# Patient Record
Sex: Male | Born: 1950 | Race: White | Hispanic: No | Smoking: Never smoker
Health system: Southern US, Community
[De-identification: ages and names within clinical notes are randomized; demographics above are authoritative.]

## PROBLEM LIST (undated history)

## (undated) DIAGNOSIS — G8929 Other chronic pain: Secondary | ICD-10-CM

## (undated) DIAGNOSIS — F32A Depression, unspecified: Secondary | ICD-10-CM

## (undated) DIAGNOSIS — F1010 Alcohol abuse, uncomplicated: Secondary | ICD-10-CM

## (undated) DIAGNOSIS — I10 Essential (primary) hypertension: Secondary | ICD-10-CM

## (undated) DIAGNOSIS — F329 Major depressive disorder, single episode, unspecified: Secondary | ICD-10-CM

## (undated) DIAGNOSIS — S069X9A Unspecified intracranial injury with loss of consciousness of unspecified duration, initial encounter: Secondary | ICD-10-CM

## (undated) DIAGNOSIS — S069XAA Unspecified intracranial injury with loss of consciousness status unknown, initial encounter: Secondary | ICD-10-CM

## (undated) DIAGNOSIS — F4310 Post-traumatic stress disorder, unspecified: Secondary | ICD-10-CM

## (undated) HISTORY — PX: KNEE SURGERY: SHX244

## (undated) HISTORY — PX: HERNIA REPAIR: SHX51

## (undated) HISTORY — PX: BACK SURGERY: SHX140

## (undated) HISTORY — PX: OTHER SURGICAL HISTORY: SHX169

---

## 2000-09-29 ENCOUNTER — Ambulatory Visit (HOSPITAL_COMMUNITY): Admission: RE | Admit: 2000-09-29 | Discharge: 2000-09-29 | Payer: Self-pay | Admitting: Psychiatry

## 2000-09-29 ENCOUNTER — Encounter: Payer: Self-pay | Admitting: Psychiatry

## 2000-11-10 ENCOUNTER — Encounter: Admission: RE | Admit: 2000-11-10 | Discharge: 2000-11-14 | Payer: Self-pay

## 2001-03-09 ENCOUNTER — Emergency Department (HOSPITAL_COMMUNITY): Admission: EM | Admit: 2001-03-09 | Discharge: 2001-03-09 | Payer: Self-pay | Admitting: *Deleted

## 2001-05-16 ENCOUNTER — Emergency Department (HOSPITAL_COMMUNITY): Admission: EM | Admit: 2001-05-16 | Discharge: 2001-05-16 | Payer: Self-pay | Admitting: Emergency Medicine

## 2001-05-21 ENCOUNTER — Emergency Department (HOSPITAL_COMMUNITY): Admission: EM | Admit: 2001-05-21 | Discharge: 2001-05-21 | Payer: Self-pay | Admitting: *Deleted

## 2001-07-26 ENCOUNTER — Encounter: Payer: Self-pay | Admitting: Emergency Medicine

## 2001-07-26 ENCOUNTER — Emergency Department (HOSPITAL_COMMUNITY): Admission: EM | Admit: 2001-07-26 | Discharge: 2001-07-26 | Payer: Self-pay | Admitting: *Deleted

## 2001-07-29 ENCOUNTER — Emergency Department (HOSPITAL_COMMUNITY): Admission: EM | Admit: 2001-07-29 | Discharge: 2001-07-29 | Payer: Self-pay | Admitting: Emergency Medicine

## 2001-07-31 ENCOUNTER — Emergency Department (HOSPITAL_COMMUNITY): Admission: EM | Admit: 2001-07-31 | Discharge: 2001-07-31 | Payer: Self-pay

## 2001-08-03 ENCOUNTER — Encounter: Payer: Self-pay | Admitting: Emergency Medicine

## 2001-08-03 ENCOUNTER — Emergency Department (HOSPITAL_COMMUNITY): Admission: EM | Admit: 2001-08-03 | Discharge: 2001-08-03 | Payer: Self-pay | Admitting: Emergency Medicine

## 2001-08-05 ENCOUNTER — Emergency Department (HOSPITAL_COMMUNITY): Admission: EM | Admit: 2001-08-05 | Discharge: 2001-08-05 | Payer: Self-pay | Admitting: Emergency Medicine

## 2001-08-10 ENCOUNTER — Emergency Department (HOSPITAL_COMMUNITY): Admission: EM | Admit: 2001-08-10 | Discharge: 2001-08-10 | Payer: Self-pay | Admitting: Emergency Medicine

## 2001-09-09 ENCOUNTER — Emergency Department (HOSPITAL_COMMUNITY): Admission: EM | Admit: 2001-09-09 | Discharge: 2001-09-09 | Payer: Self-pay | Admitting: Emergency Medicine

## 2001-10-06 ENCOUNTER — Emergency Department (HOSPITAL_COMMUNITY): Admission: EM | Admit: 2001-10-06 | Discharge: 2001-10-06 | Payer: Self-pay

## 2001-10-08 ENCOUNTER — Emergency Department (HOSPITAL_COMMUNITY): Admission: EM | Admit: 2001-10-08 | Discharge: 2001-10-08 | Payer: Self-pay | Admitting: Emergency Medicine

## 2001-12-13 ENCOUNTER — Encounter: Payer: Self-pay | Admitting: Emergency Medicine

## 2001-12-13 ENCOUNTER — Emergency Department (HOSPITAL_COMMUNITY): Admission: EM | Admit: 2001-12-13 | Discharge: 2001-12-14 | Payer: Self-pay | Admitting: Emergency Medicine

## 2001-12-15 ENCOUNTER — Encounter: Payer: Self-pay | Admitting: Emergency Medicine

## 2001-12-15 ENCOUNTER — Inpatient Hospital Stay (HOSPITAL_COMMUNITY): Admission: EM | Admit: 2001-12-15 | Discharge: 2001-12-18 | Payer: Self-pay | Admitting: Emergency Medicine

## 2002-01-23 ENCOUNTER — Emergency Department (HOSPITAL_COMMUNITY): Admission: EM | Admit: 2002-01-23 | Discharge: 2002-01-23 | Payer: Self-pay | Admitting: *Deleted

## 2002-03-08 ENCOUNTER — Emergency Department (HOSPITAL_COMMUNITY): Admission: EM | Admit: 2002-03-08 | Discharge: 2002-03-09 | Payer: Self-pay | Admitting: Emergency Medicine

## 2002-03-09 ENCOUNTER — Encounter: Payer: Self-pay | Admitting: Emergency Medicine

## 2002-03-09 ENCOUNTER — Emergency Department (HOSPITAL_COMMUNITY): Admission: EM | Admit: 2002-03-09 | Discharge: 2002-03-09 | Payer: Self-pay | Admitting: *Deleted

## 2003-02-08 ENCOUNTER — Emergency Department (HOSPITAL_COMMUNITY): Admission: EM | Admit: 2003-02-08 | Discharge: 2003-02-08 | Payer: Self-pay | Admitting: Emergency Medicine

## 2003-02-08 ENCOUNTER — Encounter: Payer: Self-pay | Admitting: Emergency Medicine

## 2014-02-15 ENCOUNTER — Emergency Department: Payer: Self-pay | Admitting: Emergency Medicine

## 2014-02-16 LAB — URINALYSIS, COMPLETE
BACTERIA: NONE SEEN
BILIRUBIN, UR: NEGATIVE
BLOOD: NEGATIVE
GLUCOSE, UR: NEGATIVE mg/dL (ref 0–75)
Hyaline Cast: 4
Leukocyte Esterase: NEGATIVE
NITRITE: NEGATIVE
Ph: 5 (ref 4.5–8.0)
Protein: NEGATIVE
SQUAMOUS EPITHELIAL: NONE SEEN
Specific Gravity: 1.017 (ref 1.003–1.030)
WBC UR: 1 /HPF (ref 0–5)

## 2014-02-16 LAB — COMPREHENSIVE METABOLIC PANEL
ALT: 40 U/L
Albumin: 3.1 g/dL — ABNORMAL LOW (ref 3.4–5.0)
Alkaline Phosphatase: 70 U/L
Anion Gap: 5 — ABNORMAL LOW (ref 7–16)
BUN: 31 mg/dL — ABNORMAL HIGH (ref 7–18)
Bilirubin,Total: 0.5 mg/dL (ref 0.2–1.0)
CHLORIDE: 105 mmol/L (ref 98–107)
CREATININE: 2.43 mg/dL — AB (ref 0.60–1.30)
Calcium, Total: 8.7 mg/dL (ref 8.5–10.1)
Co2: 30 mmol/L (ref 21–32)
EGFR (African American): 35 — ABNORMAL LOW
GFR CALC NON AF AMER: 29 — AB
Glucose: 68 mg/dL (ref 65–99)
Osmolality: 284 (ref 275–301)
Potassium: 5 mmol/L (ref 3.5–5.1)
SGOT(AST): 39 U/L — ABNORMAL HIGH (ref 15–37)
Sodium: 140 mmol/L (ref 136–145)
Total Protein: 6.6 g/dL (ref 6.4–8.2)

## 2014-02-16 LAB — DRUG SCREEN, URINE
AMPHETAMINES, UR SCREEN: NEGATIVE (ref ?–1000)
BARBITURATES, UR SCREEN: NEGATIVE (ref ?–200)
BENZODIAZEPINE, UR SCRN: NEGATIVE (ref ?–200)
COCAINE METABOLITE, UR ~~LOC~~: NEGATIVE (ref ?–300)
Cannabinoid 50 Ng, Ur ~~LOC~~: NEGATIVE (ref ?–50)
MDMA (ECSTASY) UR SCREEN: NEGATIVE (ref ?–500)
Methadone, Ur Screen: POSITIVE (ref ?–300)
OPIATE, UR SCREEN: NEGATIVE (ref ?–300)
PHENCYCLIDINE (PCP) UR S: NEGATIVE (ref ?–25)
TRICYCLIC, UR SCREEN: POSITIVE (ref ?–1000)

## 2014-02-16 LAB — CBC
HCT: 42.3 % (ref 40.0–52.0)
HGB: 13.4 g/dL (ref 13.0–18.0)
MCH: 26.8 pg (ref 26.0–34.0)
MCHC: 31.8 g/dL — ABNORMAL LOW (ref 32.0–36.0)
MCV: 84 fL (ref 80–100)
Platelet: 217 10*3/uL (ref 150–440)
RBC: 5.02 10*6/uL (ref 4.40–5.90)
RDW: 17.7 % — ABNORMAL HIGH (ref 11.5–14.5)
WBC: 8.6 10*3/uL (ref 3.8–10.6)

## 2014-02-16 LAB — SALICYLATE LEVEL: Salicylates, Serum: <1.7 mg/dL

## 2014-02-16 LAB — ETHANOL: Ethanol: <3 mg/dL

## 2014-02-16 LAB — ACETAMINOPHEN LEVEL

## 2014-05-11 ENCOUNTER — Emergency Department: Payer: Self-pay | Admitting: Emergency Medicine

## 2014-05-12 LAB — BASIC METABOLIC PANEL
ANION GAP: 8 (ref 7–16)
BUN: 24 mg/dL — ABNORMAL HIGH (ref 7–18)
CHLORIDE: 116 mmol/L — AB (ref 98–107)
Calcium, Total: 7.9 mg/dL — ABNORMAL LOW (ref 8.5–10.1)
Co2: 24 mmol/L (ref 21–32)
Creatinine: 1.65 mg/dL — ABNORMAL HIGH (ref 0.60–1.30)
EGFR (African American): 54 — ABNORMAL LOW
GFR CALC NON AF AMER: 45 — AB
GLUCOSE: 87 mg/dL (ref 65–99)
Osmolality: 298 (ref 275–301)
Potassium: 4.5 mmol/L (ref 3.5–5.1)
Sodium: 148 mmol/L — ABNORMAL HIGH (ref 136–145)

## 2014-05-12 LAB — CBC WITH DIFFERENTIAL/PLATELET
BASOS ABS: 0.1 10*3/uL (ref 0.0–0.1)
BASOS PCT: 0.7 %
EOS ABS: 1.1 10*3/uL — AB (ref 0.0–0.7)
Eosinophil %: 10 %
HCT: 42.4 % (ref 40.0–52.0)
HGB: 13.5 g/dL (ref 13.0–18.0)
LYMPHS PCT: 23.6 %
Lymphocyte #: 2.5 10*3/uL (ref 1.0–3.6)
MCH: 26.4 pg (ref 26.0–34.0)
MCHC: 31.9 g/dL — ABNORMAL LOW (ref 32.0–36.0)
MCV: 83 fL (ref 80–100)
Monocyte #: 1.4 x10 3/mm — ABNORMAL HIGH (ref 0.2–1.0)
Monocyte %: 12.9 %
Neutrophil #: 5.5 10*3/uL (ref 1.4–6.5)
Neutrophil %: 52.8 %
Platelet: 113 10*3/uL — ABNORMAL LOW (ref 150–440)
RBC: 5.12 10*6/uL (ref 4.40–5.90)
RDW: 18.7 % — ABNORMAL HIGH (ref 11.5–14.5)
WBC: 10.5 10*3/uL (ref 3.8–10.6)

## 2014-05-12 LAB — SEDIMENTATION RATE: ERYTHROCYTE SED RATE: 3 mm/h (ref 0–20)

## 2014-05-17 LAB — CULTURE, BLOOD (SINGLE)

## 2014-09-13 NOTE — Consult Note (Signed)
PATIENT NAME:  Omar Davis, Omar Davis MR#:  [id number] DATE OF BIRTH:  Jul 12, 1950  DATE OF CONSULTATION:  02/16/2014  CONSULTING PHYSICIAN:  Tarisha Fader K. Franchot Mimes, MD  PLACE OF DICTATION:  [facility] Emergency Room #24, [place], [place].  PATIENT'S AGE:  64 years.   SEX:  Male    The patient was seen in consultation in [facility] Emergency Room #24.  The patient is a 64 year old  male, not employed and is retired from [employer].  The patient is divorced for many years and lives by himself and his girlfriend visits him and sometimes stays with him and she is 66 years old.  The patient was brought to the Emergency Room after he took too many methadone pills which are prescribed to him and this was accidental.  The patient reports that he is in chronic pain and goes to the Pain Clinic and has an appointment coming up at the [facility] for the same.   MENTAL STATUS EXAMINATION:  The patient is alert and oriented to person, place and time, fully aware of the situation that brought him here.  He admits feeling low down and depressed about his chronic pain and not getting enough benefit from the meds..  No psychosis, does not appear to respond to internal stimuli. Cognition intact. General fund of information is fair.  Denies suicidal, homicidal plans and contracts for safety, eager to get discharged and go home and keep up his followup appointment at the [facility] outpatient clinic.   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 History of PTSD.   RECOMMENDATION:   Discharge the patient home with his girlfriend so that he can keep up is followup appointments at the [place].    ____________________________ Wallace Cullens. Franchot Mimes, MD skc:lt D: 02/16/2014 13:13:39 ET T: 02/16/2014 15:23:39 ET JOB#: [id number]  cc: Arlyn Leak K. Franchot Mimes, MD, <Dictator> Dewain Penning MD ELECTRONICALLY SIGNED 03/07/2014 14:22

## 2014-10-24 ENCOUNTER — Emergency Department: Payer: Medicare Other

## 2014-10-24 ENCOUNTER — Encounter: Payer: Self-pay | Admitting: *Deleted

## 2014-10-24 ENCOUNTER — Emergency Department
Admission: EM | Admit: 2014-10-24 | Discharge: 2014-10-24 | Disposition: A | Discharge status: Home / Self Care | Payer: Medicare Other | Attending: Emergency Medicine | Admitting: Emergency Medicine

## 2014-10-24 DIAGNOSIS — F32A Depression, unspecified: Secondary | ICD-10-CM | POA: Insufficient documentation

## 2014-10-24 DIAGNOSIS — F329 Major depressive disorder, single episode, unspecified: Secondary | ICD-10-CM | POA: Insufficient documentation

## 2014-10-24 DIAGNOSIS — Z720 Tobacco use: Secondary | ICD-10-CM | POA: Insufficient documentation

## 2014-10-24 DIAGNOSIS — Z880 Allergy status to penicillin: Secondary | ICD-10-CM | POA: Insufficient documentation

## 2014-10-24 DIAGNOSIS — G8929 Other chronic pain: Secondary | ICD-10-CM | POA: Insufficient documentation

## 2014-10-24 DIAGNOSIS — M25561 Pain in right knee: Secondary | ICD-10-CM | POA: Insufficient documentation

## 2014-10-24 DIAGNOSIS — Z9889 Other specified postprocedural states: Secondary | ICD-10-CM | POA: Insufficient documentation

## 2014-10-24 HISTORY — DX: Depression, unspecified: F32.A

## 2014-10-24 HISTORY — DX: Major depressive disorder, single episode, unspecified: F32.9

## 2014-10-24 HISTORY — DX: Other chronic pain: G89.29

## 2014-10-24 MED ORDER — KETOROLAC TROMETHAMINE 60 MG/2ML IM SOLN
60.0000 mg | Freq: Once | INTRAMUSCULAR | Status: AC
  Administered 2014-10-24: 60 mg via INTRAMUSCULAR

## 2014-10-24 MED ORDER — HYDROCODONE-ACETAMINOPHEN 5-325 MG PO TABS: 1.0000 | ORAL_TABLET | ORAL | Status: DC | PRN

## 2014-10-24 MED ORDER — KETOROLAC TROMETHAMINE 60 MG/2ML IM SOLN
INTRAMUSCULAR | Status: AC
  Filled 2014-10-24: qty 2

## 2014-10-24 NOTE — Discharge Instructions (Signed)
° °  [facility] OR CALL [facility] ABOUT AN APPOINTMENT YOU WERE GIVEN AN INJECTION OF TORADOL TODAY NORCO FOR 2 DAYS ONLY AS DIRECTED USE KNEE BRACE WHEN WALKING FOR 3-4 DAYS

## 2014-10-24 NOTE — ED Provider Notes (Signed)
[facility] Emergency Department Provider Note  ____________________________________________  Time seen: 10:15 AM  I have reviewed the triage vital signs and the nursing notes.   HISTORY  Chief Complaint Knee Pain   HPI Melesio Madara is a 64 y.o. male is here with complaint of chronic right knee pain. He states that he had a knee replacement in November at the [facility]. He is dissatisfied with the "service" that he is getting there and that they will only give him tramadol for his pain. He states this is like drinking water. He is interested in seeing an orthopedist in this area. Currently he rates his pain as 9 out of 10 and is worse with moving.He also has his knee wrapped with Coban and it is somewhat tight around his knee. He states he needs some form of brace for his knee to reduce his pain.   Past Medical History  Diagnosis Date  . Depressed   . Chronic pain     Patient Active Problem List   Diagnosis Date Noted  . Depressed   . Chronic pain     Past Surgical History  Procedure Laterality Date  . Knee surgery    . Hernia repair    . Prostectomy N/A     Current Outpatient Rx  Name  Route  Sig  Dispense  Refill  . HYDROcodone-acetaminophen (NORCO/VICODIN) 5-325 MG per tablet   Oral   Take 1 tablet by mouth every 4 (four) hours as needed for moderate pain.   12 tablet   0     Allergies Penicillins  No family history on file.  Social History History  Substance Use Topics  . Smoking status: Current Every Day Smoker  . Smokeless tobacco: Not on file  . Alcohol Use: No    Review of Systems Constitutional: No fever/chills Cardiovascular: Denies chest pain. Respiratory: Denies shortness of breath. Gastrointestinal: No abdominal pain.  No nausea, no vomiting.  Genitourinary: Negative for dysuria. Musculoskeletal: Negative for back pain. Skin: Negative for rash. Neurological: Negative for headaches, focal weakness or  numbness. Psychiatric:PTSD and depression  10-point ROS otherwise negative.  ____________________________________________   PHYSICAL EXAM:  VITAL SIGNS: ED Triage Vitals  Enc Vitals Group     BP 10/24/14 0929 145/85 mmHg     Pulse Rate 10/24/14 0929 85     Resp --      Temp 10/24/14 0929 99.1 F (37.3 C)     Temp Source 10/24/14 0929 Oral     SpO2 10/24/14 0929 97 %     Weight 10/24/14 0929 190 lb (86.183 kg)     Height 10/24/14 0929 5\' 8"  (1.727 m)     Head Cir --      Peak Flow --      Pain Score 10/24/14 0959 9     Pain Loc --      Pain Edu? --      Excl. in [place]? --     Constitutional: Alert and oriented. Well appearing and in no acute distress. Eyes: Conjunctivae are normal. PERRL. EOMI. Head: Atraumatic. Nose: No congestion/rhinnorhea. Neck: No stridor.   Cardiovascular: Normal rate, regular rhythm. Grossly normal heart sounds.  Good peripheral circulation. Respiratory: Normal respiratory effort.  No retractions. Lungs CTAB. Musculoskeletal: No lower  right leg. tenderness nor edema.  Left leg no edema and no effusion noted. Moderate tenderness on palpation anterior knee. Range of motion is restricted secondary to pain. Neurologic:  Normal speech and language. No gross  focal neurologic deficits are appreciated. Speech is normal. No gait instability. Skin:  Skin is warm, dry and intact. No rash, warmth or erythema noted noted. Psychiatric: Mood and affect are normal. Speech and behavior are normal.  ____________________________________________   LABS (all labs ordered are listed, but only abnormal results are displayed)  Labs Reviewed - No data to display RADIOLOGY  Status post total knee replacement with prosthetic components well seated per radiologist. No fracture or dislocation. No joint effusion no erosive change. ____________________________________________   PROCEDURES  Procedure(s) performed: None  Critical Care performed:  No  ____________________________________________   INITIAL IMPRESSION / ASSESSMENT AND PLAN / ED COURSE  Pertinent labs & imaging results that were available during my care of the patient were reviewed by me and considered in my medical decision making (see chart for details).  Patient is  to follow-up with the [facility] or Dr. Marry Guan.  He is given a knee immobilizer to wear for several days just for support. He is to continue his ultram as directed.  He was given Norco to take for today and tomorrow and then resume his tramadol.     FINAL CLINICAL IMPRESSION(S) / ED DIAGNOSES  Final diagnoses:  Knee pain, chronic, right      Johnn Hai, PA-C 10/24/14 [place], MD 10/24/14 [phone]

## 2014-10-27 ENCOUNTER — Emergency Department
Admission: EM | Admit: 2014-10-27 | Discharge: 2014-10-27 | Disposition: A | Discharge status: Home / Self Care | Payer: Medicare Other | Attending: Emergency Medicine | Admitting: Emergency Medicine

## 2014-10-27 ENCOUNTER — Encounter: Payer: Self-pay | Admitting: Emergency Medicine

## 2014-10-27 DIAGNOSIS — Z96651 Presence of right artificial knee joint: Secondary | ICD-10-CM | POA: Insufficient documentation

## 2014-10-27 DIAGNOSIS — M25561 Pain in right knee: Secondary | ICD-10-CM | POA: Insufficient documentation

## 2014-10-27 DIAGNOSIS — Z7952 Long term (current) use of systemic steroids: Secondary | ICD-10-CM | POA: Insufficient documentation

## 2014-10-27 DIAGNOSIS — Z79899 Other long term (current) drug therapy: Secondary | ICD-10-CM | POA: Insufficient documentation

## 2014-10-27 MED ORDER — PREDNISONE 10 MG PO TABS: ORAL_TABLET | ORAL | Status: DC

## 2014-10-27 MED ORDER — KETOROLAC TROMETHAMINE 60 MG/2ML IM SOLN
INTRAMUSCULAR | Status: AC
  Administered 2014-10-27: 60 mg
  Filled 2014-10-27: qty 2

## 2014-10-27 MED ORDER — OXYCODONE-ACETAMINOPHEN 5-325 MG PO TABS: 1.0000 | ORAL_TABLET | ORAL | Status: DC | PRN

## 2014-10-27 MED ORDER — DIAZEPAM 5 MG PO TABS
ORAL_TABLET | ORAL | Status: AC
  Administered 2014-10-27: 5 mg via ORAL
  Filled 2014-10-27: qty 1

## 2014-10-27 MED ORDER — CYCLOBENZAPRINE HCL 10 MG PO TABS: 10.0000 mg | ORAL_TABLET | Freq: Three times a day (TID) | ORAL | Status: DC | PRN

## 2014-10-27 MED ORDER — DIAZEPAM 5 MG PO TABS
5.0000 mg | ORAL_TABLET | Freq: Once | ORAL | Status: AC
  Administered 2014-10-27: 5 mg via ORAL

## 2014-10-27 MED ORDER — KETOROLAC TROMETHAMINE 30 MG/ML IJ SOLN: 60.0000 mg | Freq: Once | INTRAMUSCULAR | Status: AC

## 2014-10-27 NOTE — ED Notes (Addendum)
Pt states that he had a TKR in November last year. Since then he has had some burning in his right knee. He started having pain in his right knee about 1 week ago. He was treated in the ER for that . He states since then  the pain has been getting worse. Pt states that he takes tramadol and it doesn't help with pain.

## 2014-10-27 NOTE — ED Provider Notes (Signed)
[facility] Emergency Department Provider Note  ____________________________________________  Time seen: Approximately 5:29 PM  I have reviewed the triage vital signs and the nursing notes.   HISTORY  Chief Complaint Knee Pain    HPI Omar Davis is a 64 y.o. 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 An echo patient was seen here 3 days ago for the same and not getting better. States pain is a 10 over 10. States he is scheduled to follow-up with the [place] soon but no appointment as of yet. Desires an MRI.  Past Medical History  Diagnosis Date  . Depressed   . Chronic pain     Patient Active Problem List   Diagnosis Date Noted  . Depressed   . Chronic pain     Past Surgical History  Procedure Laterality Date  . Knee surgery    . Hernia repair    . Prostectomy N/A   . Knee surgery      Current Outpatient Rx  Name  Route  Sig  Dispense  Refill  . cyclobenzaprine (FLEXERIL) 10 MG tablet   Oral   Take 1 tablet (10 mg total) by mouth every 8 (eight) hours as needed for muscle spasms.   30 tablet   1   . HYDROcodone-acetaminophen (NORCO/VICODIN) 5-325 MG per tablet   Oral   Take 1 tablet by mouth every 4 (four) hours as needed for moderate pain.   12 tablet   0   . oxyCODONE-acetaminophen (ROXICET) 5-325 MG per tablet   Oral   Take 1-2 tablets by mouth every 4 (four) hours as needed for severe pain.   15 tablet   0   . predniSONE (DELTASONE) 10 MG tablet      Take 5 tablets daily for 5 days.   25 tablet   0     Allergies Gabapentin and Penicillins  No family history on file.  Social History History  Substance Use Topics  . Smoking status: Former Research scientist (life sciences)  . Smokeless tobacco: Not on file  . Alcohol Use: Not on file    Review of Systems Constitutional: No  fever/chills Eyes: No visual changes. ENT: No sore throat. Cardiovascular: Denies chest pain. Respiratory: Denies shortness of breath. Gastrointestinal: No abdominal pain.  No nausea, no vomiting.  No diarrhea.  No constipation. Genitourinary: Negative for dysuria. Musculoskeletal: Positive for right knee pain. Skin: Negative for rash. Neurological: Negative for headaches, focal weakness or numbness.  10-point ROS otherwise negative.  ____________________________________________   PHYSICAL EXAM:  VITAL SIGNS: ED Triage Vitals  Enc Vitals Group     BP 10/27/14 1630 129/74 mmHg     Pulse Rate 10/27/14 1630 98     Resp 10/27/14 1630 20     Temp 10/27/14 1630 99.3 F (37.4 C)     Temp Source 10/27/14 1630 Oral     SpO2 10/27/14 1630 98 %     Weight 10/27/14 1630 185 lb (83.915 kg)     Height 10/27/14 1630 5\' 8"  (1.727 m)     Head Cir --      Peak Flow --      Pain Score 10/27/14 1632 9     Pain Loc --      Pain Edu? --      Excl. in  [place]? --     Constitutional: Alert and oriented. Well appearing and in no acute distress. Eyes: Conjunctivae are normal. PERRL. EOMI. Head: Atraumatic. Nose: No congestion/rhinnorhea. Mouth/Throat: Mucous membranes are moist.  Oropharynx non-erythematous. Neck: No stridor.   Cardiovascular: Normal rate, regular rhythm. Grossly normal heart sounds.  Good peripheral circulation. Respiratory: Normal respiratory effort.  No retractions. Lungs CTAB. Gastrointestinal: Soft and nontender. No distention. No abdominal bruits. No CVA tenderness. Musculoskeletal: Right knee with tenderness to range of motion. Increased pain with extension and flexion. No warmth or erythema noted to the knee itself. Neurologic:  Normal speech and language. No gross focal neurologic deficits are appreciated. Speech is normal. No gait instability. Skin:  Skin is warm, dry and intact. No rash noted. Psychiatric: Mood and affect are normal. Speech and behavior are  normal.  ____________________________________________   LABS (all labs ordered are listed, but only abnormal results are displayed)  Labs Reviewed - No data to display ____________________________________________  EKG  Deferred ____________________________________________  RADIOLOGY  Deferred ____________________________________________   PROCEDURES  Procedure(s) performed: None  Critical Care performed: No  ____________________________________________   INITIAL IMPRESSION / ASSESSMENT AND PLAN / ED COURSE  Pertinent labs & imaging results that were available during my care of the patient were reviewed by me and considered in my medical decision making (see chart for details).  Previous visit demonstrated well seated prosthetics. Patient still has continuous pain will treat with ibuprofen, Flexeril, Percocet. [facility] Marry [facility]an. He voices no other emergency medical complaints at this time. ____________________________________________   FINAL CLINICAL IMPRESSION(S) / ED DIAGNOSES  Final diagnoses:  Knee pain, acute, right      Arlyss Repress, PA-C 10/27/14 1743  Ponciano Ort, MD 10/27/14 2219

## 2014-10-27 NOTE — Discharge Instructions (Signed)

## 2014-10-30 ENCOUNTER — Encounter: Payer: Self-pay | Admitting: *Deleted

## 2014-10-30 ENCOUNTER — Emergency Department
Admission: EM | Admit: 2014-10-30 | Discharge: 2014-10-30 | Disposition: A | Discharge status: Home / Self Care | Payer: Medicare Other | Attending: Emergency Medicine | Admitting: Emergency Medicine

## 2014-10-30 DIAGNOSIS — Z7952 Long term (current) use of systemic steroids: Secondary | ICD-10-CM | POA: Insufficient documentation

## 2014-10-30 DIAGNOSIS — Z880 Allergy status to penicillin: Secondary | ICD-10-CM | POA: Insufficient documentation

## 2014-10-30 DIAGNOSIS — G8929 Other chronic pain: Secondary | ICD-10-CM | POA: Insufficient documentation

## 2014-10-30 DIAGNOSIS — Z96651 Presence of right artificial knee joint: Secondary | ICD-10-CM | POA: Insufficient documentation

## 2014-10-30 DIAGNOSIS — M25561 Pain in right knee: Secondary | ICD-10-CM

## 2014-10-30 MED ORDER — HYDROMORPHONE HCL 1 MG/ML IJ SOLN
1.0000 mg | Freq: Once | INTRAMUSCULAR | Status: AC
  Administered 2014-10-30: 1 mg via INTRAMUSCULAR

## 2014-10-30 MED ORDER — HYDROMORPHONE HCL 1 MG/ML IJ SOLN
INTRAMUSCULAR | Status: AC
  Administered 2014-10-30: 1 mg via INTRAMUSCULAR
  Filled 2014-10-30: qty 1

## 2014-10-30 MED ORDER — KETOROLAC TROMETHAMINE 60 MG/2ML IM SOLN
60.0000 mg | Freq: Once | INTRAMUSCULAR | Status: AC
  Administered 2014-10-30: 60 mg via INTRAMUSCULAR

## 2014-10-30 MED ORDER — KETOROLAC TROMETHAMINE 60 MG/2ML IM SOLN
INTRAMUSCULAR | Status: AC
  Administered 2014-10-30: 60 mg via INTRAMUSCULAR
  Filled 2014-10-30: qty 2

## 2014-10-30 NOTE — ED Provider Notes (Signed)
[facility] Emergency Department Provider Note  ____________________________________________  Time seen: Approximately 8:01 PM  I have reviewed the triage vital signs and the nursing notes.   HISTORY  Chief Complaint Knee Pain    HPI Omar Davis is a 64 y.o. male patient here for medication for right knee pain. Patient has a history of AV patient area does not have an orthopedic appointment to the 14th of this month. This is the patient's third visit this month for pain medication. Patient stated his intention to kick him to the ER to get pain medication to his apartment 14th of June. Patient states this pain is going on for several weeks now with a history of any knee replacement of the same extremity back in October 2015. Patient is rating his pain as a 10 over 10. States pain increases with extension of the knee. State he has had x-rays on his previous visit which are unremarkable.   Past Medical History  Diagnosis Date  . Depressed   . Chronic pain     Patient Active Problem List   Diagnosis Date Noted  . Depressed   . Chronic pain     Past Surgical History  Procedure Laterality Date  . Knee surgery    . Hernia repair    . Prostectomy N/A   . Knee surgery      Current Outpatient Rx  Name  Route  Sig  Dispense  Refill  . cyclobenzaprine (FLEXERIL) 10 MG tablet   Oral   Take 1 tablet (10 mg total) by mouth every 8 (eight) hours as needed for muscle spasms.   30 tablet   1   . HYDROcodone-acetaminophen (NORCO/VICODIN) 5-325 MG per tablet   Oral   Take 1 tablet by mouth every 4 (four) hours as needed for moderate pain.   12 tablet   0   . oxyCODONE-acetaminophen (ROXICET) 5-325 MG per tablet   Oral   Take 1-2 tablets by mouth every 4 (four) hours as needed for severe pain.   15 tablet   0   . predniSONE (DELTASONE) 10 MG tablet      Take 5 tablets daily for 5 days.   25 tablet   0     Allergies Gabapentin and  Penicillins  No family history on file.  Social History History  Substance Use Topics  . Smoking status: Never Smoker   . Smokeless tobacco: Not on file  . Alcohol Use: No    Review of Systems Constitutional: No fever/chills Eyes: No visual changes. ENT: No sore throat. Cardiovascular: Denies chest pain. Respiratory: Denies shortness of breath. Gastrointestinal: No abdominal pain.  No nausea, no vomiting.  No diarrhea.  No constipation. Genitourinary: Negative for dysuria. Musculoskeletal: Right knee pain. Skin: Negative for rash. Neurological: Negative for headaches, focal weakness or numbness. 10-point ROS otherwise negative.  ____________________________________________   PHYSICAL EXAM:  VITAL SIGNS: ED Triage Vitals  Enc Vitals Group     BP 10/30/14 1954 136/73 mmHg     Pulse Rate 10/30/14 1954 104     Resp 10/30/14 1954 18     Temp 10/30/14 1954 98.2 F (36.8 C)     Temp src --      SpO2 10/30/14 1954 100 %     Weight 10/30/14 1954 185 lb (83.915 kg)     Height 10/30/14 1954 5\' 8"  (1.727 m)     Head Cir --      Peak Flow --  Pain Score 10/30/14 1955 10     Pain Loc --      Pain Edu? --      Excl. in [place]? --     Constitutional: Alert and oriented. Well appearing and in no acute distress. Eyes: Conjunctivae are normal. PERRL. EOMI. Head: Atraumatic. Nose: No congestion/rhinnorhea. Mouth/Throat: Mucous membranes are moist.  Oropharynx non-erythematous. Neck: No stridor.  No deformity for nuchal range of motion nontender palpation. Hematological/Lymphatic/Immunilogical: No cervical lymphadenopathy. Cardiovascular: Normal rate, regular rhythm. Grossly normal heart sounds.  Good peripheral circulation. Respiratory: Normal respiratory effort.  No retractions. Lungs CTAB. Gastrointestinal: Soft and nontender. No distention. No abdominal bruits. No CVA tenderness. Musculoskeletal: No obvious deformity to the right knee no obvious edema some mild crepitus  nontender palpation patient is in a wheelchair when asked to stand complain of pain with ambulation. Neurologic:  Normal speech and language. No gross focal neurologic deficits are appreciated. Speech is normal. No gait instability. Skin:  Skin is warm, dry and intact. No rash noted. Psychiatric: Mood and affect are normal. Speech and behavior are normal.  ____________________________________________   LABS (all labs ordered are listed, but only abnormal results are displayed)  Labs Reviewed - No data to display ____________________________________________  EKG   ____________________________________________  RADIOLOGY  Review of previous x-ray shows no abnormality of the prosthesis of the right knee. ____________________________________________   PROCEDURES  Procedure(s) performed: None  Critical Care performed: No  ____________________________________________   INITIAL IMPRESSION / ASSESSMENT AND PLAN / ED COURSE  Pertinent labs & imaging results that were available during my care of the patient were reviewed by me and considered in my medical decision making (see chart for details).  Chronic knee pain ____________________________________________   FINAL CLINICAL IMPRESSION(S) / ED DIAGNOSES  Final diagnoses:  Right knee pain      Sable Feil, PA-C 10/30/14 2017  Lisa Roca, MD 10/30/14 2250

## 2014-10-30 NOTE — Discharge Instructions (Signed)
Advised to follow up with [facility], or Urgent care for continual care.

## 2014-10-30 NOTE — ED Notes (Signed)
Patient with no complaints at this time. Respirations even and unlabored. Skin warm/dry. Discharge instructions reviewed with patient at this time. Patient given opportunity to voice concerns/ask questions. Patient discharged at this time and left Emergency Department, via wheelchair.   

## 2014-10-30 NOTE — ED Notes (Signed)
Pt states he has had right knee pain for several weeks, hx of knee replacement in same extremity. Pt was seen here on the 3rd and has run out of his pain medication, "it's all I want and then I can go home." Pt has appt with ortho doc on the 14th.

## 2014-10-31 ENCOUNTER — Encounter: Payer: Self-pay | Admitting: Emergency Medicine

## 2014-10-31 ENCOUNTER — Emergency Department: Payer: Medicare Other

## 2014-10-31 ENCOUNTER — Emergency Department
Admission: EM | Admit: 2014-10-31 | Discharge: 2014-10-31 | Disposition: A | Discharge status: Home / Self Care | Payer: Medicare Other | Attending: Emergency Medicine | Admitting: Emergency Medicine

## 2014-10-31 DIAGNOSIS — S39012A Strain of muscle, fascia and tendon of lower back, initial encounter: Secondary | ICD-10-CM | POA: Insufficient documentation

## 2014-10-31 DIAGNOSIS — S8991XA Unspecified injury of right lower leg, initial encounter: Secondary | ICD-10-CM | POA: Insufficient documentation

## 2014-10-31 DIAGNOSIS — W19XXXA Unspecified fall, initial encounter: Secondary | ICD-10-CM

## 2014-10-31 DIAGNOSIS — Y9389 Activity, other specified: Secondary | ICD-10-CM | POA: Insufficient documentation

## 2014-10-31 DIAGNOSIS — Y998 Other external cause status: Secondary | ICD-10-CM | POA: Insufficient documentation

## 2014-10-31 DIAGNOSIS — M25561 Pain in right knee: Secondary | ICD-10-CM

## 2014-10-31 DIAGNOSIS — W1839XA Other fall on same level, initial encounter: Secondary | ICD-10-CM | POA: Insufficient documentation

## 2014-10-31 DIAGNOSIS — Y9289 Other specified places as the place of occurrence of the external cause: Secondary | ICD-10-CM | POA: Insufficient documentation

## 2014-10-31 MED ORDER — HYDROCODONE-ACETAMINOPHEN 5-325 MG PO TABS: 1.0000 | ORAL_TABLET | Freq: Four times a day (QID) | ORAL | Status: DC | PRN

## 2014-10-31 MED ORDER — HYDROCODONE-ACETAMINOPHEN 5-325 MG PO TABS
1.0000 | ORAL_TABLET | ORAL | Status: AC
  Administered 2014-10-31: 1 via ORAL

## 2014-10-31 NOTE — ED Notes (Signed)
Chronic right knee pain, knee gave out this am and patient fell.  Did not hit head.  Denies neck pain.  States he now has worsened back pain after fall.

## 2014-10-31 NOTE — Discharge Instructions (Signed)
Knee Pain The knee is the complex joint between your thigh and your lower leg. It is made up of bones, tendons, ligaments, and cartilage. The bones that make up the knee are:  The femur in the thigh.  The tibia and fibula in the lower leg.  The patella or kneecap riding in the groove on the lower femur. CAUSES  Knee pain is a common complaint with many causes. A few of these causes are:  Injury, such as:  A ruptured ligament or tendon injury.  Torn cartilage.  Medical conditions, such as:  Gout  Arthritis  Infections  Overuse, over training, or overdoing a physical activity. Knee pain can be minor or severe. Knee pain can accompany debilitating injury. Minor knee problems often respond well to self-care measures or get well on their own. More serious injuries may need medical intervention or even surgery. SYMPTOMS The knee is complex. Symptoms of knee problems can vary widely. Some of the problems are:  Pain with movement and weight bearing.  Swelling and tenderness.  Buckling of the knee.  Inability to straighten or extend your knee.  Your knee locks and you cannot straighten it.  Warmth and redness with pain and fever.  Deformity or dislocation of the kneecap. DIAGNOSIS  Determining what is wrong may be very straight forward such as when there is an injury. It can also be challenging because of the complexity of the knee. Tests to make a diagnosis may include:  Your caregiver taking a history and doing a physical exam.  Routine X-rays can be used to rule out other problems. X-rays will not reveal a cartilage tear. Some injuries of the knee can be diagnosed by:  Arthroscopy a surgical technique by which a small video camera is inserted through tiny incisions on the sides of the knee. This procedure is used to examine and repair internal knee joint problems. Tiny instruments can be used during arthroscopy to repair the torn knee cartilage (meniscus).  Arthrography  is a radiology technique. A contrast liquid is directly injected into the knee joint. Internal structures of the knee joint then become visible on X-ray film.  An MRI scan is a non X-ray radiology procedure in which magnetic fields and a computer produce two- or three-dimensional images of the inside of the knee. Cartilage tears are often visible using an MRI scanner. MRI scans have largely replaced arthrography in diagnosing cartilage tears of the knee.  Blood work.  Examination of the fluid that helps to lubricate the knee joint (synovial fluid). This is done by taking a sample out using a needle and a syringe. TREATMENT The treatment of knee problems depends on the cause. Some of these treatments are:  Depending on the injury, proper casting, splinting, surgery, or physical therapy care will be needed.  Give yourself adequate recovery time. Do not overuse your joints. If you begin to get sore during workout routines, back off. Slow down or do fewer repetitions.  For repetitive activities such as cycling or running, maintain your strength and nutrition.  Alternate muscle groups. For example, if you are a weight lifter, work the upper body on one day and the lower body the next.  Either tight or weak muscles do not give the proper support for your knee. Tight or weak muscles do not absorb the stress placed on the knee joint. Keep the muscles surrounding the knee strong.  Take care of mechanical problems.  If you have flat feet, orthotics or special shoes may help.  See your caregiver if you need help. °¨ Arch supports, sometimes with wedges on the inner or outer aspect of the heel, can help. These can shift pressure away from the side of the knee most bothered by osteoarthritis. °¨ A brace called an "unloader" brace also may be used to help ease the pressure on the most arthritic side of the knee. °· If your caregiver has prescribed crutches, braces, wraps or ice, use as directed. The acronym  for this is PRICE. This means protection, rest, ice, compression, and elevation. °· Nonsteroidal anti-inflammatory drugs (NSAIDs), can help relieve pain. But if taken immediately after an injury, they may actually increase swelling. Take NSAIDs with food in your stomach. Stop them if you develop stomach problems. Do not take these if you have a history of ulcers, stomach pain, or bleeding from the bowel. Do not take without your caregiver's approval if you have problems with fluid retention, heart failure, or kidney problems. °· For ongoing knee problems, physical therapy may be helpful. °· Glucosamine and chondroitin are over-the-counter dietary supplements. Both may help relieve the pain of osteoarthritis in the knee. These medicines are different from the usual anti-inflammatory drugs. Glucosamine may decrease the rate of cartilage destruction. °· Injections of a corticosteroid drug into your knee joint may help reduce the symptoms of an arthritis flare-up. They may provide pain relief that lasts a few months. You may have to wait a few months between injections. The injections do have a small increased risk of infection, water retention, and elevated blood sugar levels. °· Hyaluronic acid injected into damaged joints may ease pain and provide lubrication. These injections may work by reducing inflammation. A series of shots may give relief for as long as 6 months. °· Topical painkillers. Applying certain ointments to your skin may help relieve the pain and stiffness of osteoarthritis. Ask your pharmacist for suggestions. Many over the-counter products are approved for temporary relief of arthritis pain. °· In some countries, doctors often prescribe topical NSAIDs for relief of chronic conditions such as arthritis and tendinitis. A review of treatment with NSAID creams found that they worked as well as oral medications but without the serious side effects. °PREVENTION °· Maintain a healthy weight. Extra pounds  put more strain on your joints. °· Get strong, stay limber. Weak muscles are a common cause of knee injuries. Stretching is important. Include flexibility exercises in your workouts. °· Be smart about exercise. If you have osteoarthritis, chronic knee pain or recurring injuries, you may need to change the way you exercise. This does not mean you have to stop being active. If your knees ache after jogging or playing basketball, consider switching to swimming, water aerobics, or other low-impact activities, at least for a few days a week. Sometimes limiting high-impact activities will provide relief. °· Make sure your shoes fit well. Choose footwear that is right for your sport. °· Protect your knees. Use the proper gear for knee-sensitive activities. Use kneepads when playing volleyball or laying carpet. Buckle your seat belt every time you drive. Most shattered kneecaps occur in car accidents. °· Rest when you are tired. °SEEK MEDICAL CARE IF:  °You have knee pain that is continual and does not seem to be getting better.  °SEEK IMMEDIATE MEDICAL CARE IF:  °Your knee joint feels hot to the touch and you have a high fever. °MAKE SURE YOU:  °· Understand these instructions. °· Will watch your condition. °· Will get help right away if you are not   doing well or get worse. Document Released: 03/06/2007 Document Revised: 08/01/2011 Document Reviewed: 03/06/2007 [facility] Patient Information 2015 [place], [place]. This information is not intended to replace advice given to you by your health care provider. Make sure you discuss any questions you have with your health care provider.  Back Pain, Adult Back pain is very common. The pain often gets better over time. The cause of back pain is usually not dangerous. Most people can learn to manage their back pain on their own.  HOME CARE   Stay active. Start with short walks on flat ground if you can. Try to walk farther each day.  Do not sit, drive, or stand in one place  for more than 30 minutes. Do not stay in bed.  Do not avoid exercise or work. Activity can help your back heal faster.  Be careful when you bend or lift an object. Bend at your knees, keep the object close to you, and do not twist.  Sleep on a firm mattress. Lie on your side, and bend your knees. If you lie on your back, put a pillow under your knees.  Only take medicines as told by your doctor.  Put ice on the injured area.  Put ice in a plastic bag.  Place a towel between your skin and the bag.  Leave the ice on for 15-20 minutes, 03-04 times a day for the first 2 to 3 days. After that, you can switch between ice and heat packs.  Ask your doctor about back exercises or massage.  Avoid feeling anxious or stressed. Find good ways to deal with stress, such as exercise. GET HELP RIGHT AWAY IF:   Your pain does not go away with rest or medicine.  Your pain does not go away in 1 week.  You have new problems.  You do not feel well.  The pain spreads into your legs.  You cannot control when you poop (bowel movement) or pee (urinate).  Your arms or legs feel weak or lose feeling (numbness).  You feel sick to your stomach (nauseous) or throw up (vomit).  You have belly (abdominal) pain.  You feel like you may pass out (faint). MAKE SURE YOU:   Understand these instructions.  Will watch your condition.  Will get help right away if you are not doing well or get worse. Document Released: 10/26/2007 Document Revised: 08/01/2011 Document Reviewed: 09/10/2013 [facility] Patient Information 2015 [place], [place]. This information is not intended to replace advice given to you by your health care provider. Make sure you discuss any questions you have with your health care provider.

## 2014-10-31 NOTE — ED Notes (Signed)
Pt states that his right knee gave out and he fell down this morning. He twisted his back and right now has severe pain in his lower back and right knee. Does not have any open wounds. Looks like he is in a lot of pain , says he has been in and out of the ED because he cannot get an appointment with his orthopedic before 14th of this month.

## 2014-10-31 NOTE — ED Provider Notes (Signed)
CSN: [id number]     Arrival date & time 10/31/14  1736 History   First MD Initiated Contact with Patient 10/31/14 1919     Chief Complaint  Patient presents with  . Leg Pain     (Consider location/radiation/quality/duration/timing/severity/associated sxs/prior Treatment) HPI  64 year old male with a history of a right total knee arthroplasty performed in [place] at the [facility] hospital in 2015 presents for evaluation of right knee pain and lower back pain. Patient fell last night secondary to his knee buckling. He is having pain along the patella. Patient states he also feels like he twisted his back and his abdomen right lower back pain. No numbness tingling or radicular symptoms. Back pain is mild he described as tightness along the right paravertebral muscles. The pain is sharp along the patella and is increased with standing and knee range of motion. Patient is able to ambulate but with pain. He is currently out of pain medications. Patient had been taking tramadol but unfortunately ran out. He is also seen in the ER recently and treated with oxycodone followed by Norco.  Past Medical History  Diagnosis Date  . Depressed   . Chronic pain    Past Surgical History  Procedure Laterality Date  . Knee surgery    . Hernia repair    . Prostectomy N/A   . Knee surgery     No family history on file. History  Substance Use Topics  . Smoking status: Never Smoker   . Smokeless tobacco: Not on file  . Alcohol Use: No    Review of Systems  Constitutional: Negative.  Negative for fever, chills, activity change and appetite change.  HENT: Negative for congestion, ear pain, mouth sores, rhinorrhea, sinus pressure, sore throat and trouble swallowing.   Eyes: Negative for photophobia, pain and discharge.  Respiratory: Negative for cough, chest tightness and shortness of breath.   Cardiovascular: Negative for chest pain and leg swelling.  Gastrointestinal: Negative for nausea, vomiting, abdominal  pain, diarrhea and abdominal distention.  Genitourinary: Negative for dysuria and difficulty urinating.  Musculoskeletal: Positive for back pain and arthralgias. Negative for gait problem.  Skin: Negative for color change and rash.  Neurological: Negative for dizziness and headaches.  Hematological: Negative for adenopathy.  Psychiatric/Behavioral: Negative for behavioral problems and agitation.      Allergies  Gabapentin and Penicillins  Home Medications   Prior to Admission medications   Medication Sig Start Date End Date Taking? Authorizing Provider  HYDROcodone-acetaminophen (NORCO) 5-325 MG per tablet Take 1 tablet by mouth every 6 (six) hours as needed for moderate pain. 10/31/14   Duanne Guess, PA-C   BP 161/57 mmHg  Pulse 92  Temp(Src) 98.7 F (37.1 C) (Oral)  Resp 18  Ht 5\' 8"  (1.727 m)  Wt 185 lb (83.915 kg)  BMI 28.14 kg/m2  SpO2 100% Physical Exam  Constitutional: He is oriented to person, place, and time. He appears well-developed and well-nourished.  HENT:  Head: Normocephalic and atraumatic.  Eyes: Conjunctivae and EOM are normal. Pupils are equal, round, and reactive to light.  Neck: Normal range of motion. Neck supple.  Cardiovascular: Normal rate, regular rhythm, normal heart sounds and intact distal pulses.   Pulmonary/Chest: Effort normal and breath sounds normal. No respiratory distress. He has no wheezes. He has no rales. He exhibits no tenderness.  Abdominal: Soft. Bowel sounds are normal. He exhibits no distension. There is no tenderness.  Musculoskeletal:       Right knee: He exhibits bony  tenderness (patella). He exhibits normal range of motion (patient is able to straight leg raise), no swelling, no effusion, no ecchymosis and no deformity. Tenderness found.       Lumbar back: He exhibits tenderness (left paravertebral muscle tenderness). He exhibits normal range of motion, no bony tenderness, no swelling, no edema, no deformity, no laceration and  no spasm.  Neurological: He is alert and oriented to person, place, and time.  Skin: Skin is warm and dry.  Psychiatric: He has a normal mood and affect. His behavior is normal. Judgment and thought content normal.    ED Course  Procedures (including critical care time) Labs Review Labs Reviewed - No data to display  Imaging Review Dg Knee 2 Views Right  10/31/2014   CLINICAL DATA:  Status post right knee replacement, recent fall with right knee pain, initial encounter  EXAM: RIGHT KNEE - 1-2 VIEW  COMPARISON:  10/24/2014  FINDINGS: Right knee replacement is again identified and stable. No acute bony abnormality is seen. Some calcifications within the quadriceps tendon are again seen and stable. No acute abnormality is noted.  IMPRESSION: Chronic changes without acute abnormality.   Electronically Signed   By: Inez Catalina M.D.   On: 10/31/2014 19:57     EKG Interpretation None      MDM   Final diagnoses:  Fall, initial encounter  Right knee pain  Lumbar strain, initial encounter    1. Patient will follow-up with orthopedics in 4 days. Appointment has been scheduled. 2. Norco 5-3 25, one tablet by mouth every 6 hours when necessary pain #15 was 0 refills 3. Rest ice and elevate knee. Heating pad to lower back. Call for any worsening symptoms or urgent changes in his health    Duanne Guess, PA-C 10/31/14 2003  Harvest Dark, MD 10/31/14 2326

## 2014-11-16 ENCOUNTER — Encounter: Payer: Self-pay | Admitting: Emergency Medicine

## 2014-11-16 ENCOUNTER — Emergency Department
Admission: EM | Admit: 2014-11-16 | Discharge: 2014-11-16 | Disposition: A | Discharge status: Home / Self Care | Payer: Medicare Other | Attending: Emergency Medicine | Admitting: Emergency Medicine

## 2014-11-16 DIAGNOSIS — Z880 Allergy status to penicillin: Secondary | ICD-10-CM | POA: Insufficient documentation

## 2014-11-16 DIAGNOSIS — G8929 Other chronic pain: Secondary | ICD-10-CM | POA: Insufficient documentation

## 2014-11-16 DIAGNOSIS — M25561 Pain in right knee: Secondary | ICD-10-CM | POA: Insufficient documentation

## 2014-11-16 MED ORDER — KETOROLAC TROMETHAMINE 60 MG/2ML IM SOLN: 60.0000 mg | Freq: Once | INTRAMUSCULAR | Status: DC

## 2014-11-16 MED ORDER — CYCLOBENZAPRINE HCL 10 MG PO TABS: 10.0000 mg | ORAL_TABLET | Freq: Three times a day (TID) | ORAL | Status: DC | PRN

## 2014-11-16 NOTE — ED Notes (Signed)
MD at bedside. 

## 2014-11-16 NOTE — Discharge Instructions (Signed)
Emergency care providers appreciate that many patients coming to us are in severe pain and we wish to address their pain in the safest, most responsible manner.  It is important to recognize, however, that the proper treatment of chronic pain differs from that of the pain of injuries and acute illnesses.  Our goal is to provider quality, safe, personalized care and we thank you for giving us the opportunity to serve you.  The use of narcotics and related agents for chronic pain syndromes may lead to additional physical and psychological problems.  Nearly as many people die from prescription narcotics each year as die from car crashes.  Additionally, this risk is increased if such prescriptions are obtained from a variety of sources.  Therefore, only your primary care physician or a pain management specialist is able to safely treat such syndromes with narcotic medications long-term.  Documentation revealing such prescriptions have been sought from multiple sources may prohibit us from providing a refill or different narcotic medication.  Your name may be checked first through the [place] Controlled Substances Reporting System.  This database is a record of controlled substance medication prescriptions that the patient has received.  This has been established by [facility] in an effort to eliminate the dangerous, and often life-threatening, practice of obtaining multiple prescriptions from different medical providers.  If you have a chronic pain syndrome (i.e. chronic headaches, recurrent back or neck pain, dental pain, abdominal or pelvic pain without a specific diagnosis, or neuropathic pain such as fibromyalgia) or recurrent visits for the same condition without an acute diagnosis, you may be treated with non-narcotics and other non-addictive medicines.  Allergic reactions or negative side effects that may be reported by a patient to such medications will not typically lead to the use of a  narcotic analgesic or other controlled substance as an alternative.  Patients managing chronic pain with a personal physician should have provisions in place for breakthrough pain.  If you are in crisis, you should call your physician.  If your physician directs you to the emergency department, please have the doctor call and speak to our attending physician concerning your care.  When patients come to the Emergency Department (ED) with acute medical conditions in which the ED physician feels it is appropriate to prescribe narcotic or sedating pain medication, the physician will prescribe these in very limited quantities.  The amount of these medications will last only until you can see your primary care physician in his/her office.  Any patient who returns to the ED seeking refills should expect only non-narcotic pain medications.  In the event an acute medical condition exists and the emergency physician feels it is necessary that the patient be given a narcotic or sedating medication, a responsible adult driver should be present in the room prior to the medication being given by the nurse.  Prescriptions for narcotic or sedating medications that have been lost, stolen, or expired will NOT be refilled in the ED.  Patients who have chronic pain may receive non-narcotic prescriptions until seen by their primary care physician.  It is every patient's personal responsibility to maintain active prescriptions with his or her primary care physician or specialist.  

## 2014-11-16 NOTE — ED Notes (Signed)
Pt complains of pain to right knee for several months. Pt states that he has been taking Vicodin and Percocet but is out.

## 2014-11-16 NOTE — ED Provider Notes (Signed)
[facility] Emergency Department Provider Note ____________________________________________  Time seen: Approximately 6:23 PM  I have reviewed the triage vital signs and the nursing notes.   HISTORY  Chief Complaint Knee Pain   HPI Omar Davis is a 64 y.o. male who presents to the ER for chronic pain management. He states that he is out of the percocet and vicodin that was given to him during his last visit. He states that he is schedule to see a specialist in July, but is unable to tolerate the pain until the appointment. He denies new injury. He states the pain is in the same location, same quality and same character. No new weakness or complaints.   Past Medical History  Diagnosis Date  . Depressed   . Chronic pain     Patient Active Problem List   Diagnosis Date Noted  . Depressed   . Chronic pain     Past Surgical History  Procedure Laterality Date  . Knee surgery    . Hernia repair    . Prostectomy N/A   . Knee surgery    . Back surgery      Current Outpatient Rx  Name  Route  Sig  Dispense  Refill  . cyclobenzaprine (FLEXERIL) 10 MG tablet   Oral   Take 1 tablet (10 mg total) by mouth 3 (three) times daily as needed for muscle spasms.   30 tablet   0   . HYDROcodone-acetaminophen (NORCO) 5-325 MG per tablet   Oral   Take 1 tablet by mouth every 6 (six) hours as needed for moderate pain.   15 tablet   0     Allergies Gabapentin and Penicillins  History reviewed. No pertinent family history.  Social History History  Substance Use Topics  . Smoking status: Never Smoker   . Smokeless tobacco: Not on file  . Alcohol Use: No    Review of Systems Constitutional: No recent illness. Eyes: No visual changes. ENT: No sore throat. Cardiovascular: Denies chest pain or palpitations. Respiratory: Denies shortness of breath. Gastrointestinal: No abdominal pain.  Genitourinary: Negative for dysuria. Musculoskeletal: Pain  in right knee. Skin: Negative for rash. Neurological: Negative for headaches, focal weakness or numbness. 10-point ROS otherwise negative.  ____________________________________________   PHYSICAL EXAM:  VITAL SIGNS: ED Triage Vitals  Enc Vitals Group     BP 11/16/14 1658 154/120 mmHg     Pulse Rate 11/16/14 1658 76     Resp 11/16/14 1658 18     Temp 11/16/14 1658 97.9 F (36.6 C)     Temp Source 11/16/14 1658 Oral     SpO2 11/16/14 1658 100 %     Weight 11/16/14 1658 185 lb (83.915 kg)     Height 11/16/14 1658 5\' 8"  (1.727 m)     Head Cir --      Peak Flow --      Pain Score 11/16/14 1659 9     Pain Loc --      Pain Edu? --      Excl. in [place]? --     Constitutional: Alert and oriented. Well appearing and in no acute distress. Eyes: Conjunctivae are normal. EOMI. Head: Atraumatic. Nose: No congestion/rhinnorhea. Neck: No stridor.  Respiratory: Normal respiratory effort.   Musculoskeletal: Right knee pain with limited ROM due to pain. Neurologic:  Normal speech and language. No gross focal neurologic deficits are appreciated. Speech is normal. No gait instability. Skin:  Skin is warm, dry and intact.  Atraumatic. Psychiatric: Mood and affect are normal. Speech and behavior are normal.  ____________________________________________   LABS (all labs ordered are listed, but only abnormal results are displayed)  Labs Reviewed - No data to display ____________________________________________  RADIOLOGY  Not indicated ____________________________________________   PROCEDURES  Procedure(s) performed: None   ____________________________________________   INITIAL IMPRESSION / ASSESSMENT AND PLAN / ED COURSE  Pertinent labs & imaging results that were available during my care of the patient were reviewed by me and considered in my medical decision making (see chart for details).  Patient was given the policy for the ED chronic pain management. He was advised to  follow up with Dr. Prescott Parma tomorrow. [place] Controlled Substance Report reviewed with the patient who states that the Tramadol do not help with his pain. He does say that Flexeril helps some. He will be given a Rx for flexeril and was advised to take his anti inflammatory as prescribed.  ____________________________________________   FINAL CLINICAL IMPRESSION(S) / ED DIAGNOSES  Final diagnoses:  Chronic pain of right knee       Victorino Dike, FNP 11/16/14 2334  Nance Pear, MD 11/16/14 3568

## 2014-12-10 ENCOUNTER — Other Ambulatory Visit: Payer: Self-pay | Admitting: Orthopedic Surgery

## 2014-12-10 DIAGNOSIS — M5441 Lumbago with sciatica, right side: Secondary | ICD-10-CM

## 2014-12-16 ENCOUNTER — Ambulatory Visit
Admission: RE | Admit: 2014-12-16 | Discharge: 2014-12-16 | Disposition: A | Discharge status: Home / Self Care | Payer: PPO | Source: Ambulatory Visit | Attending: Orthopedic Surgery | Admitting: Orthopedic Surgery

## 2014-12-16 DIAGNOSIS — M5441 Lumbago with sciatica, right side: Secondary | ICD-10-CM | POA: Diagnosis present

## 2014-12-16 DIAGNOSIS — M5126 Other intervertebral disc displacement, lumbar region: Secondary | ICD-10-CM | POA: Diagnosis not present

## 2014-12-16 DIAGNOSIS — M4806 Spinal stenosis, lumbar region: Secondary | ICD-10-CM | POA: Diagnosis not present

## 2014-12-16 DIAGNOSIS — M2578 Osteophyte, vertebrae: Secondary | ICD-10-CM | POA: Diagnosis not present

## 2014-12-16 DIAGNOSIS — M1288 Other specific arthropathies, not elsewhere classified, other specified site: Secondary | ICD-10-CM | POA: Diagnosis not present

## 2015-01-11 ENCOUNTER — Emergency Department
Admission: EM | Admit: 2015-01-11 | Discharge: 2015-01-11 | Disposition: A | Discharge status: Home / Self Care | Payer: PPO | Attending: Emergency Medicine | Admitting: Emergency Medicine

## 2015-01-11 ENCOUNTER — Encounter: Payer: Self-pay | Admitting: Emergency Medicine

## 2015-01-11 DIAGNOSIS — Y929 Unspecified place or not applicable: Secondary | ICD-10-CM | POA: Insufficient documentation

## 2015-01-11 DIAGNOSIS — Z880 Allergy status to penicillin: Secondary | ICD-10-CM | POA: Insufficient documentation

## 2015-01-11 DIAGNOSIS — G8929 Other chronic pain: Secondary | ICD-10-CM | POA: Diagnosis not present

## 2015-01-11 DIAGNOSIS — Y999 Unspecified external cause status: Secondary | ICD-10-CM | POA: Diagnosis not present

## 2015-01-11 DIAGNOSIS — X58XXXA Exposure to other specified factors, initial encounter: Secondary | ICD-10-CM | POA: Insufficient documentation

## 2015-01-11 DIAGNOSIS — Y939 Activity, unspecified: Secondary | ICD-10-CM | POA: Diagnosis not present

## 2015-01-11 DIAGNOSIS — M545 Low back pain: Secondary | ICD-10-CM

## 2015-01-11 DIAGNOSIS — S39012A Strain of muscle, fascia and tendon of lower back, initial encounter: Secondary | ICD-10-CM | POA: Diagnosis not present

## 2015-01-11 MED ORDER — TRAMADOL HCL 50 MG PO TABS: 50.0000 mg | ORAL_TABLET | Freq: Four times a day (QID) | ORAL | Status: DC | PRN

## 2015-01-11 MED ORDER — PREDNISONE 10 MG PO TABS: ORAL_TABLET | ORAL | Status: DC

## 2015-01-11 NOTE — ED Notes (Signed)
PA made aware of pt's vital signs. PA gave go ahead for D/C.

## 2015-01-11 NOTE — Discharge Instructions (Signed)
FOLLOW UP WITH YOUR DOCTOR TOMORROW CONTINUE YOUR REGULAR MEDICATION

## 2015-01-11 NOTE — ED Provider Notes (Signed)
[facility] Emergency Department Provider Note  ____________________________________________  Time seen: Approximately 7:50 AM  I have reviewed the triage vital signs and the nursing notes.   HISTORY  Chief Complaint Back Pain   HPI Nicolai Labonte is a 64 y.o. male is here with complaint of low back pain. He states that the pain is radiating down his right leg. He states he fell 2 days ago and landed on his knees. He did not hit his back. He has had both knee and back surgery. He was seen at [facility] orthopedic department last week. Currently he is taking Norco, tramadol, Flexeril for his back. He denies any loss of urinary or bowel control. He states "I twisted my back".He continues to walk with a cane which is his normal ambulatory gait. He rates his pain 8 out of 10 at present.   Past Medical History  Diagnosis Date  . Depressed   . Chronic pain     Patient Active Problem List   Diagnosis Date Noted  . Depressed   . Chronic pain     Past Surgical History  Procedure Laterality Date  . Knee surgery    . Hernia repair    . Prostectomy N/A   . Knee surgery    . Back surgery      Current Outpatient Rx  Name  Route  Sig  Dispense  Refill  . cyclobenzaprine (FLEXERIL) 10 MG tablet   Oral   Take 1 tablet (10 mg total) by mouth 3 (three) times daily as needed for muscle spasms.   30 tablet   0   . HYDROcodone-acetaminophen (NORCO) 5-325 MG per tablet   Oral   Take 1 tablet by mouth every 6 (six) hours as needed for moderate pain.   15 tablet   0   . predniSONE (DELTASONE) 10 MG tablet      Take 6 tablets  today, on day 2 take 5 tablets, day 3 take 4 tablets, day 4 take 3 tablets, day 5 take  2 tablets and 1 tablet the last day   21 tablet   0   . traMADol (ULTRAM) 50 MG tablet   Oral   Take 1 tablet (50 mg total) by mouth every 6 (six) hours as needed.   5 tablet   0     Allergies Gabapentin and Penicillins  History  reviewed. No pertinent family history.  Social History Social History  Substance Use Topics  . Smoking status: Never Smoker   . Smokeless tobacco: None  . Alcohol Use: No    Review of Systems Constitutional: No fever/chills Eyes: No visual changes. Cardiovascular: Denies chest pain. Respiratory: Denies shortness of breath. Gastrointestinal: No abdominal pain.  No nausea, no vomiting. Genitourinary: Negative for dysuria. Musculoskeletal: Positive for back pain acute and chronic. Skin: Negative for rash. Neurological: Negative for headaches, focal weakness. Positive radicular pain right leg  10-point ROS otherwise negative.  ____________________________________________   PHYSICAL EXAM:  VITAL SIGNS: ED Triage Vitals  Enc Vitals Group     BP 01/11/15 0720 133/94 mmHg     Pulse Rate 01/11/15 0720 102     Resp 01/11/15 0720 20     Temp 01/11/15 0720 98.3 F (36.8 C)     Temp Source 01/11/15 0720 Oral     SpO2 01/11/15 0720 100 %     Weight 01/11/15 0720 195 lb (88.451 kg)     Height 01/11/15 0720 5\' 8"  (1.727 m)  Head Cir --      Peak Flow --      Pain Score 01/11/15 0723 8     Pain Loc --      Pain Edu? --      Excl. in [place]? --     Constitutional: Alert and oriented. Well appearing and in no acute distress. Eyes: Conjunctivae are normal. PERRL. EOMI. Head: Atraumatic. Nose: No congestion/rhinnorhea. Neck: No stridor.   Cardiovascular: Normal rate, regular rhythm. Grossly normal heart sounds.  Good peripheral circulation. Respiratory: Normal respiratory effort.  No retractions. Lungs CTAB. Gastrointestinal: Soft and nontender. No distention. Musculoskeletal: Back no gross deformity on examination. There is some soft tissue tenderness right lateral aspect. No acute muscle spasm seen. Range of motion is guarded secondary to pain. Patient was ambulatory in the ED with his cane. No tenderness on palpation of the lumbar spine. No lower extremity tenderness nor edema.   No joint effusions. Neurologic:  Normal speech and language. No gross focal neurologic deficits are appreciated. No gait instability. Skin:  Skin is warm, dry and intact. No rash noted. Psychiatric: Mood and affect are normal. Speech and behavior are normal.  ____________________________________________   LABS (all labs ordered are listed, but only abnormal results are displayed)  Labs Reviewed - No data to display  PROCEDURES  Procedure(s) performed: None  Critical Care performed: No  ____________________________________________   INITIAL IMPRESSION / ASSESSMENT AND PLAN / ED COURSE  Pertinent labs & imaging results that were available during my care of the patient were reviewed by me and considered in my medical decision making (see chart for details).  Patient states that even though he was given 80 tramadol last week he is now completely out. He is requesting more until he can see Reche Dixon at [facility]. He states that he still has Norco and Flexeril at home. He was started on a tapering dose of prednisone for his back pain and sciatica. ____________________________________________   FINAL CLINICAL IMPRESSION(S) / ED DIAGNOSES  Final diagnoses:  Lumbar strain, initial encounter  Chronic low back pain      Johnn Hai, PA-C 01/11/15 0915  Harvest Dark, MD 01/11/15 1526

## 2015-01-11 NOTE — ED Notes (Signed)
Patient to ED with c/o back pain radiating down right leg, reports falling a few days ago and since that time pain has been significant.

## 2015-01-11 NOTE — ED Notes (Signed)
NAD noted at time of D/C. Pt taken to lobby via wheelchair. Pt denies comments/concerns at this time.  

## 2015-04-07 ENCOUNTER — Encounter: Payer: Self-pay | Admitting: *Deleted

## 2015-04-07 ENCOUNTER — Emergency Department
Admission: EM | Admit: 2015-04-07 | Discharge: 2015-04-07 | Disposition: A | Discharge status: Home / Self Care | Payer: PPO | Attending: Emergency Medicine | Admitting: Emergency Medicine

## 2015-04-07 DIAGNOSIS — M549 Dorsalgia, unspecified: Secondary | ICD-10-CM

## 2015-04-07 DIAGNOSIS — M545 Low back pain: Secondary | ICD-10-CM | POA: Insufficient documentation

## 2015-04-07 DIAGNOSIS — Z7952 Long term (current) use of systemic steroids: Secondary | ICD-10-CM | POA: Insufficient documentation

## 2015-04-07 DIAGNOSIS — Z7289 Other problems related to lifestyle: Secondary | ICD-10-CM | POA: Insufficient documentation

## 2015-04-07 DIAGNOSIS — Z880 Allergy status to penicillin: Secondary | ICD-10-CM | POA: Diagnosis not present

## 2015-04-07 DIAGNOSIS — Z765 Malingerer [conscious simulation]: Secondary | ICD-10-CM

## 2015-04-07 DIAGNOSIS — M25561 Pain in right knee: Secondary | ICD-10-CM | POA: Diagnosis not present

## 2015-04-07 DIAGNOSIS — G8929 Other chronic pain: Secondary | ICD-10-CM

## 2015-04-07 NOTE — ED Notes (Signed)
Back pain, right knee pain for several years

## 2015-04-07 NOTE — Discharge Instructions (Signed)
Back Injury Prevention °Back injuries can be very painful. They can also be difficult to heal. After having one back injury, you are more likely to injure your back again. It is important to learn how to avoid injuring or re-injuring your back. The following tips can help you to prevent a back injury. °WHAT SHOULD I KNOW ABOUT PHYSICAL FITNESS? °· Exercise for 30 minutes per day on most days of the week or as directed by your health care provider. Make sure to: °· Do aerobic exercises, such as walking, jogging, biking, or swimming. °· Do exercises that increase balance and strength, such as tai chi and yoga. These can decrease your risk of falling and injuring your back. °· Do stretching exercises to help with flexibility. °· Try to develop strong abdominal muscles. Your abdominal muscles provide a lot of the support that is needed by your back. °· Maintain a healthy weight.  This helps to decrease your risk of a back injury. °WHAT SHOULD I KNOW ABOUT MY DIET? °· Talk with your health care provider about your overall diet. Take supplements and vitamins only as directed by your health care provider. °· Talk with your health care provider about how much calcium and vitamin D you need each day. These nutrients help to prevent weakening of the bones (osteoporosis). Osteoporosis can cause broken (fractured) bones, which lead to back pain. °· Include good sources of calcium in your diet, such as dairy products, green leafy vegetables, and products that have had calcium added to them (fortified). °· Include good sources of vitamin D in your diet, such as milk and foods that are fortified with vitamin D. °WHAT SHOULD I KNOW ABOUT MY POSTURE? °· Sit up straight and stand up straight. Avoid leaning forward when you sit or hunching over when you stand. °· Choose chairs that have good low-back (lumbar) support. °· If you work at a desk, sit close to it so you do not need to lean over. Keep your chin tucked in. Keep your neck  drawn back, and keep your elbows bent at a right angle. Your arms should look like the letter "L." °· Sit high and close to the steering wheel when you drive. Add a lumbar support to your car seat, if needed. °· Avoid sitting or standing in one position for very long. Take breaks to get up, stretch, and walk around at least one time every hour. Take breaks every hour if you are driving for long periods of time. °· Sleep on your side with your knees slightly bent, or sleep on your back with a pillow under your knees. Do not lie on the front of your body to sleep. °WHAT SHOULD I KNOW ABOUT LIFTING, TWISTING, AND REACHING? °Lifting and Heavy Lifting °· Avoid heavy lifting, especially repetitive heavy lifting. If you must do heavy lifting: °· Stretch before lifting. °· Work slowly. °· Rest between lifts. °· Use a tool such as a cart or a dolly to move objects if one is available. °· Make several small trips instead of carrying one heavy load. °· Ask for help when you need it, especially when moving big objects. °· Follow these steps when lifting: °· Stand with your feet shoulder-width apart. °· Get as close to the object as you can. Do not try to pick up a heavy object that is far from your body. °· Use handles or lifting straps if they are available. °· Bend at your knees. Squat down, but keep your heels off the floor. °·   Keep your shoulders pulled back, your chin tucked in, and your back straight.  Lift the object slowly while you tighten the muscles in your legs, abdomen, and buttocks. Keep the object as close to the center of your body as possible.  Follow these steps when putting down a heavy load:  Stand with your feet shoulder-width apart.  Lower the object slowly while you tighten the muscles in your legs, abdomen, and buttocks. Keep the object as close to the center of your body as possible.  Keep your shoulders pulled back, your chin tucked in, and your back straight.  Bend at your knees. Squat  down, but keep your heels off the floor.  Use handles or lifting straps if they are available. Twisting and Reaching  Avoid lifting heavy objects above your waist.  Do not twist at your waist while you are lifting or carrying a load. If you need to turn, move your feet.  Do not bend over without bending at your knees.  Avoid reaching over your head, across a table, or for an object on a high surface. WHAT ARE SOME OTHER TIPS?  Avoid wet floors and icy ground. Keep sidewalks clear of ice to prevent falls.  Do not sleep on a mattress that is too soft or too hard.  Keep items that are used frequently within easy reach.  Put heavier objects on shelves at waist level, and put lighter objects on lower or higher shelves.  Find ways to decrease your stress, such as exercise, massage, or relaxation techniques. Stress can build up in your muscles. Tense muscles are more vulnerable to injury.  Talk with your health care provider if you feel anxious or depressed. These conditions can make back pain worse.  Wear flat heel shoes with cushioned soles.  Avoid sudden movements.  Use both shoulder straps when carrying a backpack.  Do not use any tobacco products, including cigarettes, chewing tobacco, or electronic cigarettes. If you need help quitting, ask your health care provider.   This information is not intended to replace advice given to you by your health care provider. Make sure you discuss any questions you have with your health care provider.   Document Released: 06/16/2004 Document Revised: 09/23/2014 Document Reviewed: 05/13/2014 Elsevier Interactive Patient Education 2016 [employer]  Chronic Pain Chronic pain can be defined as pain that is off and on and lasts for 3-6 months or longer. Many things cause chronic pain, which can make it difficult to make a diagnosis. There are many treatment options available for chronic pain. However, finding a treatment that works well for you  may require trying various approaches until the right one is found. Many people benefit from a combination of two or more types of treatment to control their pain. SYMPTOMS  Chronic pain can occur anywhere in the body and can range from mild to very severe. Some types of chronic pain include:  Headache.  Low back pain.  Cancer pain.  Arthritis pain.  Neurogenic pain. This is pain resulting from damage to nerves. People with chronic pain may also have other symptoms such as:  Depression.  Anger.  Insomnia.  Anxiety. DIAGNOSIS  Your health care provider will help diagnose your condition over time. In many cases, the initial focus will be on excluding possible conditions that could be causing the pain. Depending on your symptoms, your health care provider may order tests to diagnose your condition. Some of these tests may include:   Blood tests.   CT  scan.   MRI.   X-rays.   Ultrasounds.   Nerve conduction studies.  You may need to see a specialist.  TREATMENT  Finding treatment that works well may take time. You may be referred to a pain specialist. He or she may prescribe medicine or therapies, such as:   Mindful meditation or yoga.  Shots (injections) of numbing or pain-relieving medicines into the spine or area of pain.  Local electrical stimulation.  Acupuncture.   Massage therapy.   Aroma, color, light, or sound therapy.   Biofeedback.   Working with a physical therapist to keep from getting stiff.   Regular, gentle exercise.   Cognitive or behavioral therapy.   Group support.  Sometimes, surgery may be recommended.  HOME CARE INSTRUCTIONS   Take all medicines as directed by your health care provider.   Lessen stress in your life by relaxing and doing things such as listening to calming music.   Exercise or be active as directed by your health care provider.   Eat a healthy diet and include things such as vegetables, fruits,  fish, and lean meats in your diet.   Keep all follow-up appointments with your health care provider.   Attend a support group with others suffering from chronic pain. SEEK MEDICAL CARE IF:   Your pain gets worse.   You develop a new pain that was not there before.   You cannot tolerate medicines given to you by your health care provider.   You have new symptoms since your last visit with your health care provider.  SEEK IMMEDIATE MEDICAL CARE IF:   You feel weak.   You have decreased sensation or numbness.   You lose control of bowel or bladder function.   Your pain suddenly gets much worse.   You develop shaking.  You develop chills.  You develop confusion.  You develop chest pain.  You develop shortness of breath.  MAKE SURE YOU:  Understand these instructions.  Will watch your condition.  Will get help right away if you are not doing well or get worse.   This information is not intended to replace advice given to you by your health care provider. Make sure you discuss any questions you have with your health care provider.   Document Released: 01/29/2002 Document Revised: 01/09/2013 Document Reviewed: 11/02/2012 Elsevier Interactive Patient Education [employer].

## 2015-04-07 NOTE — ED Provider Notes (Signed)
[facility] Emergency Department Provider Note  ____________________________________________  Time seen: Approximately 3:26 PM  I have reviewed the triage vital signs and the nursing notes.   HISTORY  Chief Complaint Back Pain    HPI Omar Davis is a 64 y.o. male who presents to the emergency department for chronic pain to lower back and knee. He states that he had surgery in 1995 that he is on regular pain medication for same. He reports that he "ran out of pain medication 2 days early." He is requesting refill of medication. He denies any injury. He denies any change in his symptoms. States the pain is constant and worsening without his medicine. He is unable to give a good descriptor of his pain. He just states that "it hurts."   Past Medical History  Diagnosis Date  . Depressed   . Chronic pain     Patient Active Problem List   Diagnosis Date Noted  . Depressed   . Chronic pain     Past Surgical History  Procedure Laterality Date  . Knee surgery    . Hernia repair    . Prostectomy N/A   . Knee surgery    . Back surgery      Current Outpatient Rx  Name  Route  Sig  Dispense  Refill  . cyclobenzaprine (FLEXERIL) 10 MG tablet   Oral   Take 1 tablet (10 mg total) by mouth 3 (three) times daily as needed for muscle spasms.   30 tablet   0   . HYDROcodone-acetaminophen (NORCO) 5-325 MG per tablet   Oral   Take 1 tablet by mouth every 6 (six) hours as needed for moderate pain.   15 tablet   0   . predniSONE (DELTASONE) 10 MG tablet      Take 6 tablets  today, on day 2 take 5 tablets, day 3 take 4 tablets, day 4 take 3 tablets, day 5 take  2 tablets and 1 tablet the last day   21 tablet   0   . traMADol (ULTRAM) 50 MG tablet   Oral   Take 1 tablet (50 mg total) by mouth every 6 (six) hours as needed.   5 tablet   0     Allergies Gabapentin and Penicillins  No family history on file.  Social History Social History   Substance Use Topics  . Smoking status: Never Smoker   . Smokeless tobacco: None  . Alcohol Use: No    Review of Systems Constitutional: No fever/chills Eyes: No visual changes. ENT: No sore throat. Cardiovascular: Denies chest pain. Respiratory: Denies shortness of breath. Gastrointestinal: No abdominal pain.  No nausea, no vomiting.  No diarrhea.  No constipation. Genitourinary: Negative for dysuria. Musculoskeletal: Endorses lower back pain. Endorses right knee pain. Skin: Negative for rash. Neurological: Negative for headaches, focal weakness or numbness.  10-point ROS otherwise negative.  ____________________________________________   PHYSICAL EXAM:  VITAL SIGNS: ED Triage Vitals  Enc Vitals Group     BP 04/07/15 1520 125/94 mmHg     Pulse Rate 04/07/15 1520 125     Resp 04/07/15 1520 20     Temp 04/07/15 1520 98.9 F (37.2 C)     Temp Source 04/07/15 1520 Oral     SpO2 04/07/15 1520 100 %     Weight 04/07/15 1520 180 lb (81.647 kg)     Height 04/07/15 1520 5\' 8"  (1.727 m)     Head Cir --  Peak Flow --      Pain Score 04/07/15 1521 8     Pain Loc --      Pain Edu? --      Excl. in [place]? --     Constitutional: Alert and oriented. Well appearing and in no acute distress. Eyes: Conjunctivae are normal. PERRL. EOMI. Head: Atraumatic. Nose: No congestion/rhinnorhea. Mouth/Throat: Mucous membranes are moist.  Oropharynx non-erythematous. Neck: No stridor.   Cardiovascular: Normal rate, regular rhythm. Grossly normal heart sounds.  Good peripheral circulation. Respiratory: Normal respiratory effort.  No retractions. Lungs CTAB. Gastrointestinal: Soft and nontender. No distention. No abdominal bruits. No CVA tenderness. Musculoskeletal: No visible deformity to back. No tenderness to palpation over spinal processes. No palpable abnormality. No visible abnormality to right knee when compared with left knee. Full Range of motion to knee. Varus and valgus, Lachman's  are negative. No lower extremity tenderness nor edema.  No joint effusions. Neurologic:  Normal speech and language. No gross focal neurologic deficits are appreciated. No gait instability. Skin:  Skin is warm, dry and intact. No rash noted. Psychiatric: Mood and affect are normal. Speech and behavior are normal.  ____________________________________________   LABS (all labs ordered are listed, but only abnormal results are displayed)  Labs Reviewed - No data to display ____________________________________________  EKG   ____________________________________________  RADIOLOGY   ____________________________________________   PROCEDURES  Procedure(s) performed: None  Critical Care performed: No  ____________________________________________   INITIAL IMPRESSION / ASSESSMENT AND PLAN / ED COURSE  Pertinent labs & imaging results that were available during my care of the patient were reviewed by me and considered in my medical decision making (see chart for details).  Patient's history, symptoms, physical exam are consistent with chronic pain. I advised the patient of findings and diagnosis. The patient is requesting narcotic pain medication for chronic pain. I offered the patient anti-inflammatories and Toradol injection here in the emergency department and patient declined.  The patient reported that he is on chronic narcotic pain medication, which he claims he ran out of, for his or back pain. The patient was queried in the [place] controlled substance database. Results show that in the last 6 days the patient has received 380 pills of either tramadol or Percocet. Based off of this query I will not prescribe any narcotic pain medications. The patient is advised that he may return to his primary care for further prescriptions but that the emergency department when no longer fill prescriptions for narcotics for him. ____________________________________________   FINAL  CLINICAL IMPRESSION(S) / ED DIAGNOSES  Final diagnoses:  Chronic back pain  Chronic knee pain, right  Drug-seeking behavior      Darletta Moll, PA-C 04/07/15 [place], MD 04/07/15 1944

## 2015-04-07 NOTE — ED Notes (Signed)
States pain is chronic pain.  Had back surgery in 1995 and takes vicodin and percocet for pain.  Ran out of medication yesterday,  "2 days early".

## 2015-04-07 NOTE — ED Notes (Signed)
AAOx3.  Skin warm and dry.  NAD 

## 2015-09-08 ENCOUNTER — Emergency Department
Admission: EM | Admit: 2015-09-08 | Discharge: 2015-09-09 | Disposition: A | Discharge status: Home / Self Care | Payer: PPO | Attending: Emergency Medicine | Admitting: Emergency Medicine

## 2015-09-08 DIAGNOSIS — F6089 Other specific personality disorders: Secondary | ICD-10-CM | POA: Diagnosis present

## 2015-09-08 DIAGNOSIS — F329 Major depressive disorder, single episode, unspecified: Secondary | ICD-10-CM | POA: Diagnosis not present

## 2015-09-08 DIAGNOSIS — F918 Other conduct disorders: Secondary | ICD-10-CM | POA: Insufficient documentation

## 2015-09-08 DIAGNOSIS — F32A Depression, unspecified: Secondary | ICD-10-CM

## 2015-09-08 DIAGNOSIS — R451 Restlessness and agitation: Secondary | ICD-10-CM

## 2015-09-08 DIAGNOSIS — F4310 Post-traumatic stress disorder, unspecified: Secondary | ICD-10-CM

## 2015-09-08 DIAGNOSIS — R4689 Other symptoms and signs involving appearance and behavior: Secondary | ICD-10-CM

## 2015-09-08 DIAGNOSIS — Z79899 Other long term (current) drug therapy: Secondary | ICD-10-CM | POA: Insufficient documentation

## 2015-09-08 DIAGNOSIS — F1994 Other psychoactive substance use, unspecified with psychoactive substance-induced mood disorder: Secondary | ICD-10-CM

## 2015-09-08 DIAGNOSIS — F1010 Alcohol abuse, uncomplicated: Secondary | ICD-10-CM

## 2015-09-08 DIAGNOSIS — F10920 Alcohol use, unspecified with intoxication, uncomplicated: Secondary | ICD-10-CM

## 2015-09-08 LAB — COMPREHENSIVE METABOLIC PANEL
ALBUMIN: 3.5 g/dL (ref 3.5–5.0)
ALT: 15 U/L — AB (ref 17–63)
AST: 22 U/L (ref 15–41)
Alkaline Phosphatase: 52 U/L (ref 38–126)
Anion gap: 12 (ref 5–15)
BUN: 24 mg/dL — ABNORMAL HIGH (ref 6–20)
CHLORIDE: 110 mmol/L (ref 101–111)
CO2: 18 mmol/L — AB (ref 22–32)
CREATININE: 1.38 mg/dL — AB (ref 0.61–1.24)
Calcium: 8.9 mg/dL (ref 8.9–10.3)
GFR calc non Af Amer: 52 mL/min — ABNORMAL LOW (ref >60)
GFR, EST AFRICAN AMERICAN: 60 mL/min — AB (ref >60)
Glucose, Bld: 86 mg/dL (ref 65–99)
Potassium: 3.4 mmol/L — ABNORMAL LOW (ref 3.5–5.1)
Sodium: 140 mmol/L (ref 135–145)
Total Bilirubin: 0.4 mg/dL (ref 0.3–1.2)
Total Protein: 6 g/dL — ABNORMAL LOW (ref 6.5–8.1)

## 2015-09-08 LAB — GLUCOSE, CAPILLARY: Glucose-Capillary: 98 mg/dL (ref 65–99)

## 2015-09-08 LAB — ETHANOL: Alcohol, Ethyl (B): 288 mg/dL — ABNORMAL HIGH (ref <5)

## 2015-09-08 LAB — URINE DRUG SCREEN, QUALITATIVE (ARMC ONLY)
AMPHETAMINES, UR SCREEN: NOT DETECTED
Barbiturates, Ur Screen: NOT DETECTED
Benzodiazepine, Ur Scrn: POSITIVE — AB
Cannabinoid 50 Ng, Ur ~~LOC~~: NOT DETECTED
Cocaine Metabolite,Ur ~~LOC~~: NOT DETECTED
MDMA (Ecstasy)Ur Screen: NOT DETECTED
Methadone Scn, Ur: NOT DETECTED
Opiate, Ur Screen: NOT DETECTED
Phencyclidine (PCP) Ur S: NOT DETECTED
Tricyclic, Ur Screen: POSITIVE — AB

## 2015-09-08 LAB — CBC
HEMATOCRIT: 40.9 % (ref 40.0–52.0)
Hemoglobin: 13.4 g/dL (ref 13.0–18.0)
MCH: 27.4 pg (ref 26.0–34.0)
MCHC: 32.8 g/dL (ref 32.0–36.0)
MCV: 83.5 fL (ref 80.0–100.0)
Platelets: 186 10*3/uL (ref 150–440)
RBC: 4.9 MIL/uL (ref 4.40–5.90)
RDW: 16.7 % — ABNORMAL HIGH (ref 11.5–14.5)
WBC: 9.5 10*3/uL (ref 3.8–10.6)

## 2015-09-08 MED ORDER — HALOPERIDOL LACTATE 5 MG/ML IJ SOLN
5.0000 mg | Freq: Once | INTRAMUSCULAR | Status: AC
  Administered 2015-09-08: 5 mg via INTRAVENOUS

## 2015-09-08 MED ORDER — LORAZEPAM 2 MG/ML IJ SOLN
2.0000 mg | Freq: Once | INTRAMUSCULAR | Status: AC
  Administered 2015-09-08: 2 mg via INTRAVENOUS
  Filled 2015-09-08: qty 1

## 2015-09-08 MED ORDER — SODIUM CHLORIDE 0.9 % IV BOLUS (SEPSIS)
1000.0000 mL | Freq: Once | INTRAVENOUS | Status: AC
  Administered 2015-09-08: 1000 mL via INTRAVENOUS

## 2015-09-08 MED ORDER — HALOPERIDOL LACTATE 5 MG/ML IJ SOLN
INTRAMUSCULAR | Status: AC
  Filled 2015-09-08: qty 1

## 2015-09-08 MED ORDER — DIPHENHYDRAMINE HCL 50 MG/ML IJ SOLN
50.0000 mg | Freq: Once | INTRAMUSCULAR | Status: AC
  Administered 2015-09-08: 50 mg via INTRAVENOUS
  Filled 2015-09-08: qty 1

## 2015-09-08 MED ORDER — ZIPRASIDONE MESYLATE 20 MG IM SOLR
INTRAMUSCULAR | Status: AC
  Filled 2015-09-08: qty 20

## 2015-09-08 NOTE — BH Specialist Note (Signed)
TTS was unable to complete assessment. Patient was medicated and was sleeping. Doctor removed consult and will reinitiate once patient is alert.

## 2015-09-08 NOTE — ED Provider Notes (Signed)
[facility] Emergency Department Provider Note    ____________________________________________  Time seen: ~1640  I have reviewed the triage vital signs and the nursing notes.   HISTORY  Chief Complaint Aggressive Behavior   History limited by: Not Limited   HPI Omar Davis is a 65 y.o. male who presents to the emergency department today after being brought in by EMS because of concerns for aggressive behavior. Per EMS report the neighbors hadn't seen the patient acting abnormally and making lab noises in his house. The patient does state that he is destroying things in his house. He states he has been very upset recently. He states that he has had a couple life stressors.  he does state he has thought about wanting to hurt himself. He denies a plan. Denies any recent medical complaints. During transport EMS did have to give the patient Haldol and Versed intramuscularly to help calm the patient down.    Past Medical History  Diagnosis Date  . Depressed   . Chronic pain     Patient Active Problem List   Diagnosis Date Noted  . Depressed   . Chronic pain     Past Surgical History  Procedure Laterality Date  . Knee surgery    . Hernia repair    . Prostectomy N/A   . Knee surgery    . Back surgery      Current Outpatient Rx  Name  Route  Sig  Dispense  Refill  . cyclobenzaprine (FLEXERIL) 10 MG tablet   Oral   Take 1 tablet (10 mg total) by mouth 3 (three) times daily as needed for muscle spasms.   30 tablet   0   . HYDROcodone-acetaminophen (NORCO) 5-325 MG per tablet   Oral   Take 1 tablet by mouth every 6 (six) hours as needed for moderate pain.   15 tablet   0   . predniSONE (DELTASONE) 10 MG tablet      Take 6 tablets  today, on day 2 take 5 tablets, day 3 take 4 tablets, day 4 take 3 tablets, day 5 take  2 tablets and 1 tablet the last day   21 tablet   0   . traMADol (ULTRAM) 50 MG tablet   Oral   Take 1 tablet (50  mg total) by mouth every 6 (six) hours as needed.   5 tablet   0     Allergies Gabapentin and Penicillins  No family history on file.  Social History Social History  Substance Use Topics  . Smoking status: Never Smoker   . Smokeless tobacco: Not on file  . Alcohol Use: No    Review of Systems  Constitutional: Negative for fever. Cardiovascular: Negative for chest pain. Respiratory: Negative for shortness of breath. Gastrointestinal: Negative for abdominal pain, vomiting and diarrhea. Neurological: Negative for headaches, focal weakness or numbness.   10-point ROS otherwise negative.  ____________________________________________   PHYSICAL EXAM:  VITAL SIGNS:    98.7 F (37.1 C)  91  16   87/49 mmHg  98 %   Constitutional: Awake and alert, calm during my examination. Eyes: Conjunctivae are normal. PERRL. Normal extraocular movements. ENT   Head: Normocephalic and atraumatic.   Nose: No congestion/rhinnorhea.   Mouth/Throat: Mucous membranes are moist.   Neck: No stridor. Hematological/Lymphatic/Immunilogical: No cervical lymphadenopathy. Cardiovascular: Normal rate, regular rhythm.  No murmurs, rubs, or gallops. Respiratory: Normal respiratory effort without tachypnea nor retractions. Breath sounds are clear and equal bilaterally. No  wheezes/rales/rhonchi. Gastrointestinal: Soft and nontender. No distention.  Genitourinary: Deferred Musculoskeletal: Normal range of motion in all extremities. No joint effusions.  No lower extremity tenderness nor edema. Neurologic:  Normal speech and language. No gross focal neurologic deficits are appreciated.  Skin:  Skin is warm, dry and intact. No rash noted. Psychiatric: Endorses suicidal ideation  ____________________________________________    LABS (pertinent positives/negatives)  Labs Reviewed  COMPREHENSIVE METABOLIC PANEL - Abnormal; Notable for the following:    Potassium 3.4 (*)    CO2 18 (*)     BUN 24 (*)    Creatinine, Ser 1.38 (*)    Total Protein 6.0 (*)    ALT 15 (*)    GFR calc non Af Amer 52 (*)    GFR calc Af Amer 60 (*)    All other components within normal limits  ETHANOL - Abnormal; Notable for the following:    Alcohol, Ethyl (B) 288 (*)    All other components within normal limits  CBC - Abnormal; Notable for the following:    RDW 16.7 (*)    All other components within normal limits  URINE DRUG SCREEN, QUALITATIVE ([facility] ONLY) - Abnormal; Notable for the following:    Tricyclic, Ur Screen POSITIVE (*)    Benzodiazepine, Ur Scrn POSITIVE (*)    All other components within normal limits  GLUCOSE, CAPILLARY     ____________________________________________   EKG  I, Nance Pear, attending physician, personally viewed and interpreted this EKG  EKG Time: 2235 Rate: 79 Rhythm: normal sinus rhythm Axis: normal Intervals: qtc 461 QRS: narrow ST changes: no st elevation Impression: normal ekg   ____________________________________________    RADIOLOGY  None  ____________________________________________   PROCEDURES  Procedure(s) performed: None  Critical Care performed: No  ____________________________________________   INITIAL IMPRESSION / ASSESSMENT AND PLAN / ED COURSE  Pertinent labs & imaging results that were available during my care of the patient were reviewed by me and considered in my medical decision making (see chart for details).  Patient presented to the emergency department today because of concerns for aggressive behavior. Patient is quite upset here in the emergency department yelling out. The patient does admit to some suicidal ideation. I do wonder if the patient has PTSD. The patient was placed under IVC given concerns for self harm.  ____________________________________________   FINAL CLINICAL IMPRESSION(S) / ED DIAGNOSES  Final diagnoses:  Aggression  Depression     Nance Pear, MD 09/08/15 2248

## 2015-09-08 NOTE — ED Notes (Signed)
Pt asleep at this time but becomes combative when awakened. MD aware, pt not dressed out at thistime, MD aware.

## 2015-09-08 NOTE — ED Notes (Signed)
Pt awake, hollering in bed. MD aware. MD in to see patient; pt mumbling, unable to understand patient.

## 2015-09-08 NOTE — ED Notes (Signed)
MD aware of patient 

## 2015-09-08 NOTE — ED Notes (Addendum)
Pt arrives to ER via [facility] from home for aggressive behavior.Pt neighbor called because they heard a lot of noise coming from patient home. Pt home alone upon EMS arrival. Pt combative at time of EMS arrival and at time of hospital arrival. Pt will not give any information as to whether he has been drinking or using drugs today. Pt cursing on arrival. Pt given 5mg  haldol and4mg  versed IM to R glute by EMS arrival. Pt vomit X2. Pt had blood on wrist with EMS, hand arrival wrapped with gauze on arrival.

## 2015-09-08 NOTE — ED Notes (Signed)
Pt changed into scrubs, clean linens.

## 2015-09-08 NOTE — ED Notes (Signed)
Pt placed on monitor.  

## 2015-09-08 NOTE — ED Notes (Signed)
Pt had $357 cash locked up with [employer]-given to [employer] placed in [place] at [employer]. Medications counted and given to Pharmacy Tech, [place].

## 2015-09-08 NOTE — ED Notes (Signed)
TTS not able to assess pt at this time, due to pt uncooperative.

## 2015-09-09 DIAGNOSIS — F4310 Post-traumatic stress disorder, unspecified: Secondary | ICD-10-CM

## 2015-09-09 DIAGNOSIS — F1994 Other psychoactive substance use, unspecified with psychoactive substance-induced mood disorder: Secondary | ICD-10-CM | POA: Diagnosis not present

## 2015-09-09 DIAGNOSIS — F1010 Alcohol abuse, uncomplicated: Secondary | ICD-10-CM

## 2015-09-09 DIAGNOSIS — R451 Restlessness and agitation: Secondary | ICD-10-CM

## 2015-09-09 MED ORDER — IBUPROFEN 800 MG PO TABS
ORAL_TABLET | ORAL | Status: AC
  Administered 2015-09-09: 800 mg via ORAL
  Filled 2015-09-09: qty 1

## 2015-09-09 MED ORDER — ACETAMINOPHEN 500 MG PO TABS
1000.0000 mg | ORAL_TABLET | Freq: Once | ORAL | Status: AC
  Administered 2015-09-09: 1000 mg via ORAL

## 2015-09-09 MED ORDER — IBUPROFEN 800 MG PO TABS
800.0000 mg | ORAL_TABLET | Freq: Once | ORAL | Status: AC
  Administered 2015-09-09: 800 mg via ORAL

## 2015-09-09 MED ORDER — IBUPROFEN 400 MG PO TABS
ORAL_TABLET | ORAL | Status: AC
  Filled 2015-09-09: qty 2

## 2015-09-09 MED ORDER — ACETAMINOPHEN 500 MG PO TABS
ORAL_TABLET | ORAL | Status: AC
  Administered 2015-09-09: 1000 mg via ORAL
  Filled 2015-09-09: qty 2

## 2015-09-09 NOTE — ED Notes (Signed)
Pt resting quietly while watching television. Pt states he has chronic pain in his back and leg, and that he drinks because he was "cut off" from his vicodin. Pt states that ibuprofen and tylenol "takes the edge off" but that they don't help enough, so he began to drink about a week ago. Pt has no memory of last night's events. Pt concerned about his wallet and keys. Told pt that his money had been locked up in the safe and that his keys are probably with the remainder of his belongings. Pt requests his dentures so he can eat when his breakfast arrives.

## 2015-09-09 NOTE — ED Provider Notes (Signed)
-----------------------------------------   2:55 PM on 09/09/2015 -----------------------------------------   Blood pressure 163/87, pulse 93, temperature 98.7 F (37.1 C), temperature source Oral, resp. rate 18, weight 180 lb (81.647 kg), SpO2 98 %.  The patient had no acute events since last update.  Calm and cooperative at this time.  Patient is now clinically sober. Evaluated by Dr. Weber Cooks of psychiatry and has been taken off commitment and is appropriate for follow-up at the [facility]. Will be discharged home.    Orbie Pyo, MD 09/09/15 [id number]

## 2015-09-09 NOTE — ED Notes (Signed)
Pt in room watching TV. 

## 2015-09-09 NOTE — Consult Note (Signed)
[facility] Face-to-Face Psychiatry Consult   Reason for Consult:  Consult for 66 year old man who was brought in by EMS and under commitment because of bizarre agitated behavior at home. Referring Physician:  Archie Balboa Patient Identification: Omar Davis MRN:  [id number] Principal Diagnosis: Substance induced mood disorder [facility]) Diagnosis:   Patient Active Problem List   Diagnosis Date Noted  . Substance induced mood disorder ([place]) [F19.94] 09/09/2015  . Alcohol abuse [F10.10] 09/09/2015  . PTSD (post-traumatic stress disorder) [F43.10] 09/09/2015  . Agitation [R45.1] 09/09/2015  . Depressed [F32.9]   . Chronic pain [G89.29]     Total Time spent with patient: 1 hour  Subjective:   Omar Davis is a 65 y.o. male patient admitted with "I guess I have blackout".  HPI:  Patient interviewed. Chart reviewed old chart reviewed labs reviewed. 64 year old man was picked up by EMS when neighbors called. Apparently they were worried that he had been acting strangely and they thought there were strange noises coming from his house. Patient says that he had about a fifth of whiskey to drink yesterday. He says he normally doesn't drink and probably only drinks maybe a couple times a month. He has no particular reason why he was drinking so much yesterday and he denies any other drug abuse. Patient says he must of blacked out because he doesn't remember any of the events after he started drinking. He knows that people told him that he tore up his house. Patient denies having any suicidal or homicidal thoughts or wishes at all. Says that his mood overall has been okay. He has some chronic sleeplessness. He says he is compliant with his medicine prescribed from the [place] for his posttraumatic stress disorder which has been stable.  Social history: Patient lives by himself. Service-connected veteran. Not working outside the home.  Medical history: History of chronic pain. Has high blood pressure now.  Otherwise denies any major other medical problems except elevated cholesterol.  Substance abuse history: Patient says he has had problems with alcohol and drug abuse in the past. Stopped abusing drugs for years ago. Says he still drinks a couple times a month but rarely does become a problem. No history of alcohol withdrawal or delirium tremens.  Past Psychiatric History: Patient has a diagnosis of posttraumatic stress disorder related to [place] service. He is followed at the [facility]. He says he is on Depakote and some other antidepressant. He's had inpatient psychiatric hospitalizations at the [facility] in the past for flashbacks and PTSD but denies that he's ever tried to kill himself and says he never gets violent even when he is having flashbacks.  Risk to Self: Is patient at risk for suicide?: No Risk to Others:   Prior Inpatient Therapy:   Prior Outpatient Therapy:    Past Medical History:  Past Medical History  Diagnosis Date  . Depressed   . Chronic pain     Past Surgical History  Procedure Laterality Date  . Knee surgery    . Hernia repair    . Prostectomy N/A   . Knee surgery    . Back surgery     Family History: No family history on file. Family Psychiatric  History: Patient says multiple people in his family have had substance abuse problems and that is the only mental health history in his family he knows of. Social History:  History  Alcohol Use No     History  Drug Use No    Social History  Social History  . Marital Status: Legally Separated    Spouse Name: N/A  . Number of Children: N/A  . Years of Education: N/A   Social History Main Topics  . Smoking status: Never Smoker   . Smokeless tobacco: Not on file  . Alcohol Use: No  . Drug Use: No  . Sexual Activity: Not on file   Other Topics Concern  . Not on file   Social History Narrative   Additional Social History:    Allergies:   Allergies  Allergen Reactions  . Gabapentin Other (See  Comments)    seizures  . Penicillins Rash    Labs:  Results for orders placed or performed during the hospital encounter of 09/08/15 (from the past 48 hour(s))  Comprehensive metabolic panel     Status: Abnormal   Collection Time: 09/08/15  4:19 PM  Result Value Ref Range   Sodium 140 135 - 145 mmol/L   Potassium 3.4 (L) 3.5 - 5.1 mmol/L   Chloride 110 101 - 111 mmol/L   CO2 18 (L) 22 - 32 mmol/L   Glucose, Bld 86 65 - 99 mg/dL   BUN 24 (H) 6 - 20 mg/dL   Creatinine, Ser 1.38 (H) 0.61 - 1.24 mg/dL   Calcium 8.9 8.9 - 10.3 mg/dL   Total Protein 6.0 (L) 6.5 - 8.1 g/dL   Albumin 3.5 3.5 - 5.0 g/dL   AST 22 15 - 41 U/L   ALT 15 (L) 17 - 63 U/L   Alkaline Phosphatase 52 38 - 126 U/L   Total Bilirubin 0.4 0.3 - 1.2 mg/dL   GFR calc non Af Amer 52 (L) >60 mL/min   GFR calc Af Amer 60 (L) >60 mL/min    Comment: (NOTE) The eGFR has been calculated using the CKD EPI equation. This calculation has not been validated in all clinical situations. eGFR's persistently <60 mL/min signify possible Chronic Kidney Disease.    Anion gap 12 5 - 15  Ethanol (ETOH)     Status: Abnormal   Collection Time: 09/08/15  4:19 PM  Result Value Ref Range   Alcohol, Ethyl (B) 288 (H) <5 mg/dL    Comment:        LOWEST DETECTABLE LIMIT FOR SERUM ALCOHOL IS 5 mg/dL FOR MEDICAL PURPOSES ONLY   CBC     Status: Abnormal   Collection Time: 09/08/15  4:19 PM  Result Value Ref Range   WBC 9.5 3.8 - 10.6 K/uL   RBC 4.90 4.40 - 5.90 MIL/uL   Hemoglobin 13.4 13.0 - 18.0 g/dL   HCT 40.9 40.0 - 52.0 %   MCV 83.5 80.0 - 100.0 fL   MCH 27.4 26.0 - 34.0 pg   MCHC 32.8 32.0 - 36.0 g/dL   RDW 16.7 (H) 11.5 - 14.5 %   Platelets 186 150 - 440 K/uL  Glucose, capillary     Status: None   Collection Time: 09/08/15  4:32 PM  Result Value Ref Range   Glucose-Capillary 98 65 - 99 mg/dL  Urine Drug Screen, Qualitative ([facility] only)     Status: Abnormal   Collection Time: 09/08/15  6:36 PM  Result Value Ref Range    Tricyclic, Ur Screen POSITIVE (A) NONE DETECTED   Amphetamines, Ur Screen NONE DETECTED NONE DETECTED   MDMA (Ecstasy)Ur Screen NONE DETECTED NONE DETECTED   Cocaine Metabolite,Ur [place] NONE DETECTED NONE DETECTED   Opiate, Ur Screen NONE DETECTED NONE DETECTED   Phencyclidine (PCP) Ur S NONE DETECTED  NONE DETECTED   Cannabinoid 50 Ng, Ur [place] NONE DETECTED NONE DETECTED   Barbiturates, Ur Screen NONE DETECTED NONE DETECTED   Benzodiazepine, Ur Scrn POSITIVE (A) NONE DETECTED   Methadone Scn, Ur NONE DETECTED NONE DETECTED    Comment: (NOTE) 169  Tricyclics, urine               Cutoff 1000 ng/mL 200  Amphetamines, urine             Cutoff 1000 ng/mL 300  MDMA (Ecstasy), urine           Cutoff 500 ng/mL 400  Cocaine Metabolite, urine       Cutoff 300 ng/mL 500  Opiate, urine                   Cutoff 300 ng/mL 600  Phencyclidine (PCP), urine      Cutoff 25 ng/mL 700  Cannabinoid, urine              Cutoff 50 ng/mL 800  Barbiturates, urine             Cutoff 200 ng/mL 900  Benzodiazepine, urine           Cutoff 200 ng/mL 1000 Methadone, urine                Cutoff 300 ng/mL 1100 1200 The urine drug screen provides only a preliminary, unconfirmed 1300 analytical test result and should not be used for non-medical 1400 purposes. Clinical consideration and professional judgment should 1500 be applied to any positive drug screen result due to possible 1600 interfering substances. A more specific alternate chemical method 1700 must be used in order to obtain a confirmed analytical result.  1800 Gas chromato graphy / mass spectrometry (GC/MS) is the preferred 1900 confirmatory method.     No current facility-administered medications for this encounter.   Current Outpatient Prescriptions  Medication Sig Dispense Refill  . divalproex (DEPAKOTE ER) 500 MG 24 hr tablet Take 1,500 mg by mouth 2 (two) times daily.    . DULoxetine (CYMBALTA) 30 MG capsule Take 90 mg by mouth daily.    Marland Kitchen lisinopril  (PRINIVIL,ZESTRIL) 10 MG tablet Take 10 mg by mouth daily.    . pravastatin (PRAVACHOL) 80 MG tablet Take 80 mg by mouth daily.    . cyclobenzaprine (FLEXERIL) 10 MG tablet Take 1 tablet (10 mg total) by mouth 3 (three) times daily as needed for muscle spasms. (Patient not taking: Reported on 09/09/2015) 30 tablet 0  . HYDROcodone-acetaminophen (NORCO) 5-325 MG per tablet Take 1 tablet by mouth every 6 (six) hours as needed for moderate pain. (Patient not taking: Reported on 09/09/2015) 15 tablet 0  . predniSONE (DELTASONE) 10 MG tablet Take 6 tablets  today, on day 2 take 5 tablets, day 3 take 4 tablets, day 4 take 3 tablets, day 5 take  2 tablets and 1 tablet the last day (Patient not taking: Reported on 09/09/2015) 21 tablet 0  . traMADol (ULTRAM) 50 MG tablet Take 1 tablet (50 mg total) by mouth every 6 (six) hours as needed. (Patient not taking: Reported on 09/09/2015) 5 tablet 0    Musculoskeletal: Strength & Muscle Tone: within normal limits Gait & Station: normal Patient leans: N/A  Psychiatric Specialty Exam: Review of Systems  Constitutional: Negative.   HENT: Negative.   Eyes: Negative.   Respiratory: Negative.   Cardiovascular: Negative.   Gastrointestinal: Negative.   Musculoskeletal: Negative.   Skin: Negative.   Neurological:  Negative.   Psychiatric/Behavioral: Positive for memory loss and substance abuse. Negative for depression, suicidal ideas and hallucinations. The patient is not nervous/anxious and does not have insomnia.     Blood pressure 163/87, pulse 93, temperature 98.7 F (37.1 C), temperature source Oral, resp. rate 18, weight 81.647 kg (180 lb), SpO2 98 %.Body mass index is 27.38 kg/(m^2).  General Appearance: Casual  Eye Contact::  Good  Speech:  Clear and Coherent  Volume:  Decreased  Mood:  Euthymic  Affect:  Constricted  Thought Process:  Goal Directed  Orientation:  Full (Time, Place, and Person)  Thought Content:  Negative  Suicidal Thoughts:  No   Homicidal Thoughts:  No  Memory:  Immediate;   Good Recent;   Fair Remote;   Poor  Judgement:  Fair  Insight:  Fair  Psychomotor Activity:  Normal  Concentration:  Fair  Recall:  [employer] of Knowledge:Fair  Language: Fair  Akathisia:  No  Handed:  Right  AIMS (if indicated):     Assets:  Communication Skills Desire for Improvement Financial Resources/Insurance Housing Physical Health Resilience Social Support  ADL's:  Intact  Cognition: WNL  Sleep:      Treatment Plan Summary: Plan 65 year old man came into the hospital with what appears to be substance induced behavior. He was intoxicated with an elevated blood alcohol level. He admits that he had been drinking heavily yesterday and had a blackout. Today he is completely calm and lucid. Totally denies any thoughts of hurting himself or anyone else. Not having any psychotic symptoms. Shows reasonable insight and being able to say that he should not drink this heavily. At this point is no indication for commitment criteria. Reviewed with patient the dangers of continued alcohol abuse and he says he is planning to not drink anymore. He will continue to follow-up at the [facility] and stay on his current medicine. IVC discontinued. Case reviewed with emergency room doctor. Agitation and dangerous behavior resolved.  Disposition: Patient does not meet criteria for psychiatric inpatient admission. Supportive therapy provided about ongoing stressors.  Alethia Berthold, MD 09/09/2015 3:02 PM

## 2015-09-09 NOTE — Discharge Instructions (Signed)
Alcohol Intoxication  Alcohol intoxication occurs when the amount of alcohol that a person has consumed impairs his or her ability to mentally and physically function. Alcohol directly impairs the normal chemical activity of the brain. Drinking large amounts of alcohol can lead to changes in mental function and behavior, and it can cause many physical effects that can be harmful.   Alcohol intoxication can range in severity from mild to very severe. Various factors can affect the level of intoxication that occurs, such as the person's age, gender, weight, frequency of alcohol consumption, and the presence of other medical conditions (such as diabetes, seizures, or heart conditions). Dangerous levels of alcohol intoxication may occur when people drink large amounts of alcohol in a short period (binge drinking). Alcohol can also be especially dangerous when combined with certain prescription medicines or "recreational" drugs.  SIGNS AND SYMPTOMS  Some common signs and symptoms of mild alcohol intoxication include:  · Loss of coordination.  · Changes in mood and behavior.  · Impaired judgment.  · Slurred speech.  As alcohol intoxication progresses to more severe levels, other signs and symptoms will appear. These may include:  · Vomiting.  · Confusion and impaired memory.  · Slowed breathing.  · Seizures.  · Loss of consciousness.  DIAGNOSIS   Your health care provider will take a medical history and perform a physical exam. You will be asked about the amount and type of alcohol you have consumed. Blood tests will be done to measure the concentration of alcohol in your blood. In many places, your blood alcohol level must be lower than 80 mg/dL (0.08%) to legally drive. However, many dangerous effects of alcohol can occur at much lower levels.   TREATMENT   People with alcohol intoxication often do not require treatment. Most of the effects of alcohol intoxication are temporary, and they go away as the alcohol naturally  leaves the body. Your health care provider will monitor your condition until you are stable enough to go home. Fluids are sometimes given through an IV access tube to help prevent dehydration.   HOME CARE INSTRUCTIONS  · Do not drive after drinking alcohol.  · Stay hydrated. Drink enough water and fluids to keep your urine clear or pale yellow. Avoid caffeine.    · Only take over-the-counter or prescription medicines as directed by your health care provider.    SEEK MEDICAL CARE IF:   · You have persistent vomiting.    · You do not feel better after a few days.  · You have frequent alcohol intoxication. Your health care provider can help determine if you should see a substance use treatment counselor.  SEEK IMMEDIATE MEDICAL CARE IF:   · You become shaky or tremble when you try to stop drinking.    · You shake uncontrollably (seizure).    · You throw up (vomit) blood. This may be bright red or may look like black coffee grounds.    · You have blood in your stool. This may be bright red or may appear as a black, tarry, bad smelling stool.    · You become lightheaded or faint.    MAKE SURE YOU:   · Understand these instructions.  · Will watch your condition.  · Will get help right away if you are not doing well or get worse.     This information is not intended to replace advice given to you by your health care provider. Make sure you discuss any questions you have with your health care provider.       Document Released: 02/16/2005 Document Revised: 01/09/2013 Document Reviewed: 10/12/2012  Elsevier Interactive Patient Education ©2016 [employer]

## 2015-09-09 NOTE — ED Notes (Signed)
Pts medications given to ED Pharmacy tech for count and storage, white sheet placed on chart.

## 2015-09-09 NOTE — ED Notes (Signed)
Pt's home medications given to this tech and stored in main pharmacy.

## 2015-09-09 NOTE — ED Notes (Signed)
Pt states he needs to get home before his landlord leaves. Informed pt that the MD will have to determine whether or not he is going to be released and I am unable to give him a time frame for the MD's visit. Pt calm and cooperative at this time.

## 2015-09-09 NOTE — ED Notes (Signed)
Pt discharged home after verbalizing understanding of discharge instructions; nad noted. 

## 2015-09-09 NOTE — ED Notes (Signed)
Pt again requested cream for psoriasis, but doesn't know what he uses and says he has no way of finding out. This nurse has researched medical record but cannot find any mention of cream or psoriasis in general. Pt with NAD noted.

## 2015-09-09 NOTE — ED Provider Notes (Signed)
-----------------------------------------   8:03 AM on 09/09/2015 -----------------------------------------   Blood pressure 134/78, pulse 90, temperature 98.3 F (36.8 C), temperature source Oral, resp. rate 16, weight 180 lb (81.647 kg), SpO2 96 %.  The patient had no acute events since last update.  Calm and cooperative at this time.  Disposition is pending per Psychiatry/Behavioral Medicine team recommendations.     Loney Hering, MD 09/09/15 [id number]

## 2015-09-24 ENCOUNTER — Emergency Department
Admission: EM | Admit: 2015-09-24 | Discharge: 2015-09-25 | Disposition: A | Discharge status: Home / Self Care | Payer: PPO | Attending: Emergency Medicine | Admitting: Emergency Medicine

## 2015-09-24 DIAGNOSIS — R45851 Suicidal ideations: Secondary | ICD-10-CM

## 2015-09-24 DIAGNOSIS — Z79899 Other long term (current) drug therapy: Secondary | ICD-10-CM | POA: Diagnosis not present

## 2015-09-24 DIAGNOSIS — F4310 Post-traumatic stress disorder, unspecified: Secondary | ICD-10-CM | POA: Diagnosis present

## 2015-09-24 DIAGNOSIS — F1010 Alcohol abuse, uncomplicated: Secondary | ICD-10-CM | POA: Diagnosis present

## 2015-09-24 DIAGNOSIS — F1914 Other psychoactive substance abuse with psychoactive substance-induced mood disorder: Secondary | ICD-10-CM | POA: Diagnosis not present

## 2015-09-24 DIAGNOSIS — F329 Major depressive disorder, single episode, unspecified: Secondary | ICD-10-CM | POA: Diagnosis not present

## 2015-09-24 DIAGNOSIS — F1994 Other psychoactive substance use, unspecified with psychoactive substance-induced mood disorder: Secondary | ICD-10-CM | POA: Diagnosis not present

## 2015-09-24 DIAGNOSIS — F32A Depression, unspecified: Secondary | ICD-10-CM

## 2015-09-24 LAB — CBC WITH DIFFERENTIAL/PLATELET
Basophils Absolute: 0 10*3/uL (ref 0–0.1)
EOS ABS: 0.3 10*3/uL (ref 0–0.7)
HCT: 41.8 % (ref 40.0–52.0)
HEMOGLOBIN: 13.2 g/dL (ref 13.0–18.0)
Lymphocytes Relative: 16 %
Lymphs Abs: 2.2 10*3/uL (ref 1.0–3.6)
MCH: 26.1 pg (ref 26.0–34.0)
MCHC: 31.7 g/dL — AB (ref 32.0–36.0)
MCV: 82.5 fL (ref 80.0–100.0)
Monocytes Absolute: 1.6 10*3/uL — ABNORMAL HIGH (ref 0.2–1.0)
Neutro Abs: 9.6 10*3/uL — ABNORMAL HIGH (ref 1.4–6.5)
PLATELETS: 328 10*3/uL (ref 150–440)
RBC: 5.06 MIL/uL (ref 4.40–5.90)
RDW: 16.7 % — ABNORMAL HIGH (ref 11.5–14.5)
WBC: 13.8 10*3/uL — AB (ref 3.8–10.6)

## 2015-09-24 LAB — BASIC METABOLIC PANEL
Anion gap: 13 (ref 5–15)
BUN: 29 mg/dL — AB (ref 6–20)
CO2: 21 mmol/L — ABNORMAL LOW (ref 22–32)
CREATININE: 1.88 mg/dL — AB (ref 0.61–1.24)
Calcium: 8.8 mg/dL — ABNORMAL LOW (ref 8.9–10.3)
Chloride: 108 mmol/L (ref 101–111)
GFR, EST AFRICAN AMERICAN: 41 mL/min — AB (ref >60)
GFR, EST NON AFRICAN AMERICAN: 36 mL/min — AB (ref >60)
Glucose, Bld: 106 mg/dL — ABNORMAL HIGH (ref 65–99)
Potassium: 4.5 mmol/L (ref 3.5–5.1)
SODIUM: 142 mmol/L (ref 135–145)

## 2015-09-24 LAB — ETHANOL: ALCOHOL ETHYL (B): 214 mg/dL — AB (ref <5)

## 2015-09-24 LAB — ACETAMINOPHEN LEVEL: Acetaminophen (Tylenol), Serum: <10 ug/mL — ABNORMAL LOW (ref 10–30)

## 2015-09-24 LAB — SALICYLATE LEVEL

## 2015-09-24 MED ORDER — LORAZEPAM 2 MG/ML IJ SOLN
1.0000 mg | Freq: Once | INTRAMUSCULAR | Status: AC
  Administered 2015-09-24: 1 mg via INTRAVENOUS
  Filled 2015-09-24: qty 1

## 2015-09-24 MED ORDER — HALOPERIDOL 5 MG PO TABS
5.0000 mg | ORAL_TABLET | Freq: Once | ORAL | Status: AC
  Administered 2015-09-24: 5 mg via ORAL
  Filled 2015-09-24: qty 1

## 2015-09-24 MED ORDER — LORAZEPAM 1 MG PO TABS
1.0000 mg | ORAL_TABLET | Freq: Once | ORAL | Status: AC
  Administered 2015-09-24: 1 mg via ORAL
  Filled 2015-09-24: qty 1

## 2015-09-24 MED ORDER — HALOPERIDOL LACTATE 5 MG/ML IJ SOLN
5.0000 mg | Freq: Once | INTRAMUSCULAR | Status: AC
  Administered 2015-09-24: 5 mg via INTRAMUSCULAR
  Filled 2015-09-24: qty 1

## 2015-09-24 MED ORDER — DIPHENHYDRAMINE HCL 50 MG/ML IJ SOLN
50.0000 mg | Freq: Once | INTRAMUSCULAR | Status: AC
  Administered 2015-09-24: 50 mg via INTRAVENOUS
  Filled 2015-09-24: qty 1

## 2015-09-24 NOTE — ED Provider Notes (Signed)
[facility] Emergency Department Provider Note    ____________________________________________  Time seen: ~1920  I have reviewed the triage vital signs and the nursing notes.   HISTORY  Chief Complaint Mental Health Problem   History limited by: Poor historian    HPI Omar Davis is a 65 y.o. male who presents to the emergency department today under the custody [facility] [employer] and under involuntary confinement because of concerns for suicidal ideation and abnormal behavior. The patient himself is a somewhat poor historian.He cannot give a very detailed history about once been going on recently but does state that he has been having flashbacks to [place]. He repeated over and over that he wanted to blow his brains out. He stated he did not understand the point of living. He didn't deny any recent medical complaints.   Past Medical History  Diagnosis Date  . Depressed   . Chronic pain     Patient Active Problem List   Diagnosis Date Noted  . Substance induced mood disorder ([place]) 09/09/2015  . Alcohol abuse 09/09/2015  . PTSD (post-traumatic stress disorder) 09/09/2015  . Agitation 09/09/2015  . Depressed   . Chronic pain     Past Surgical History  Procedure Laterality Date  . Knee surgery    . Hernia repair    . Prostectomy N/A   . Knee surgery    . Back surgery      Current Outpatient Rx  Name  Route  Sig  Dispense  Refill  . cyclobenzaprine (FLEXERIL) 10 MG tablet   Oral   Take 1 tablet (10 mg total) by mouth 3 (three) times daily as needed for muscle spasms. Patient not taking: Reported on 09/09/2015   30 tablet   0   . divalproex (DEPAKOTE ER) 500 MG 24 hr tablet   Oral   Take 1,500 mg by mouth 2 (two) times daily.         . DULoxetine (CYMBALTA) 30 MG capsule   Oral   Take 90 mg by mouth daily.         Marland Kitchen HYDROcodone-acetaminophen (NORCO) 5-325 MG per tablet   Oral   Take 1 tablet by mouth every 6  (six) hours as needed for moderate pain. Patient not taking: Reported on 09/09/2015   15 tablet   0   . lisinopril (PRINIVIL,ZESTRIL) 10 MG tablet   Oral   Take 10 mg by mouth daily.         . pravastatin (PRAVACHOL) 80 MG tablet   Oral   Take 80 mg by mouth daily.         . predniSONE (DELTASONE) 10 MG tablet      Take 6 tablets  today, on day 2 take 5 tablets, day 3 take 4 tablets, day 4 take 3 tablets, day 5 take  2 tablets and 1 tablet the last day Patient not taking: Reported on 09/09/2015   21 tablet   0   . traMADol (ULTRAM) 50 MG tablet   Oral   Take 1 tablet (50 mg total) by mouth every 6 (six) hours as needed. Patient not taking: Reported on 09/09/2015   5 tablet   0     Allergies Gabapentin and Penicillins  No family history on file.  Social History Social History  Substance Use Topics  . Smoking status: Never Smoker   . Smokeless tobacco: Not on file  . Alcohol Use: No    Review of Systems  Constitutional: Negative  for fever. Cardiovascular: Negative for chest pain. Respiratory: Negative for shortness of breath. Gastrointestinal: Negative for abdominal pain, vomiting and diarrhea. Neurological: Negative for headaches, focal weakness or numbness.  10-point ROS otherwise negative.  ____________________________________________   PHYSICAL EXAM:  VITAL SIGNS: ED Triage Vitals  Enc Vitals Group     BP 09/24/15 1923 167/101 mmHg     Pulse Rate 09/24/15 1923 59     Resp 09/24/15 1923 22     Temp 09/24/15 1923 97.6 F (36.4 C)     Temp Source 09/24/15 1923 Oral     SpO2 09/24/15 1923 95 %   Constitutional: Alert and oriented. Appears upset. Rocking back and forth. Eyes: Conjunctivae are normal. PERRL. Normal extraocular movements. ENT   Head: Normocephalic. Small hematoma to left forehead   Nose: No congestion/rhinnorhea.   Mouth/Throat: Mucous membranes are moist.   Neck: No stridor. Hematological/Lymphatic/Immunilogical: No  cervical lymphadenopathy. Cardiovascular: Normal rate, regular rhythm.  No murmurs, rubs, or gallops. Respiratory: Normal respiratory effort without tachypnea nor retractions. Breath sounds are clear and equal bilaterally. No wheezes/rales/rhonchi. Gastrointestinal: Soft and nontender. No distention.  Genitourinary: Deferred Musculoskeletal: Normal range of motion in all extremities. No joint effusions.  No lower extremity tenderness nor edema. Neurologic:  Normal speech and language. No gross focal neurologic deficits are appreciated.  Skin:  Skin is warm, dry and intact. No rash noted. Psychiatric: Slightly anxious and upset appearing. Endorses suicidal ideation.  ____________________________________________    LABS (pertinent positives/negatives)  Pending  ____________________________________________   EKG  None  ____________________________________________    RADIOLOGY  None  ____________________________________________   PROCEDURES  Procedure(s) performed: None  Critical Care performed: No  ____________________________________________   INITIAL IMPRESSION / ASSESSMENT AND PLAN / ED COURSE  Pertinent labs & imaging results that were available during my care of the patient were reviewed by me and considered in my medical decision making (see chart for details).  Patient presented to the emergency department today under IVC because of concerns for suicidal ideation. On exam patient does endorse suicidal ideation and appears quite upset. Will have patient be seen by psychiatry and continue IVC.  ----------------------------------------- 8:46 PM on 09/24/2015 -----------------------------------------  Asian and screaming in the ED. This is been going on for the past 20-30 minutes. We did give the wall medication some time to work. They do not appear to be sufficient. Will give IM doses. ____________________________________________   FINAL CLINICAL  IMPRESSION(S) / ED DIAGNOSES  Final diagnoses:  Depression     Nance Pear, MD 09/24/15 2256

## 2015-09-24 NOTE — ED Notes (Addendum)
Pt to triage via w/c, in custody of [place] [employer] officer; pt yelling, "wish I had a gun to just blow my dam brains out"; pupils dilated, agitated, restless, dissheveled, jerking motions of extremities ; multiple abrasions to knuckles, left side face; officer st received a call from "girlfriend's son stating he was going to kill himself"; st upon their arrival pt had abrasions noted; pt taken immediately to room 23 by EDT Lattie Haw for further evaluation; charge nurse notified

## 2015-09-25 DIAGNOSIS — F1994 Other psychoactive substance use, unspecified with psychoactive substance-induced mood disorder: Secondary | ICD-10-CM

## 2015-09-25 DIAGNOSIS — R45851 Suicidal ideations: Secondary | ICD-10-CM

## 2015-09-25 LAB — VALPROIC ACID LEVEL: Valproic Acid Lvl: 29 ug/mL — ABNORMAL LOW (ref 50.0–100.0)

## 2015-09-25 MED ORDER — LISINOPRIL 10 MG PO TABS
10.0000 mg | ORAL_TABLET | Freq: Every day | ORAL | Status: DC
  Administered 2015-09-25: 10 mg via ORAL
  Filled 2015-09-25: qty 1

## 2015-09-25 MED ORDER — IBUPROFEN 800 MG PO TABS
800.0000 mg | ORAL_TABLET | Freq: Once | ORAL | Status: AC
  Administered 2015-09-25: 800 mg via ORAL
  Filled 2015-09-25: qty 1

## 2015-09-25 MED ORDER — PRAVASTATIN SODIUM 40 MG PO TABS: 80.0000 mg | ORAL_TABLET | Freq: Every day | ORAL | Status: DC

## 2015-09-25 MED ORDER — DIVALPROEX SODIUM ER 500 MG PO TB24
1000.0000 mg | ORAL_TABLET | Freq: Two times a day (BID) | ORAL | Status: DC
  Administered 2015-09-25: 1000 mg via ORAL
  Filled 2015-09-25: qty 2

## 2015-09-25 MED ORDER — DULOXETINE HCL 30 MG PO CPEP
30.0000 mg | ORAL_CAPSULE | Freq: Every day | ORAL | Status: DC
  Administered 2015-09-25: 30 mg via ORAL
  Filled 2015-09-25: qty 1

## 2015-09-25 NOTE — ED Notes (Signed)
Lunch provided to pt

## 2015-09-25 NOTE — ED Notes (Signed)
Pt awoke and ambulated to bathroom, Pt asked RN where he was and what happened. RN asked pt if he was suicidal and he denied. Pt went back to room, pt calm and cooperative

## 2015-09-25 NOTE — ED Notes (Signed)
Resumed care from Cornelius rn.  Pt alert.  Pt calm and cooperative.  Pt waiting on discharge.

## 2015-09-25 NOTE — ED Provider Notes (Signed)
-----------------------------------------   3:23 PM on 09/25/2015 -----------------------------------------  Dr. Weber Cooks has evaluated the patient and rescinded IVC, recommends discharge with outpatient follow-up, community resources for alcohol abuse. DC home.  Joanne Gavel, MD 09/25/15 1524

## 2015-09-25 NOTE — Consult Note (Signed)
[facility] Psychiatry Consult   Reason for Consult:  Consult for this 65 year old gentleman brought to the emergency room last night by law enforcement. Concern about dangerousness behavior while intoxicated. Referring Physician:  Edd Fabian Patient Identification: Mykeal Carrick MRN:  [id number] Principal Diagnosis: Substance induced mood disorder [facility]) Diagnosis:   Patient Active Problem List   Diagnosis Date Noted  . Suicidal ideation [R45.851] 09/25/2015  . Substance induced mood disorder ([place]) [F19.94] 09/09/2015  . Alcohol abuse [F10.10] 09/09/2015  . PTSD (post-traumatic stress disorder) [F43.10] 09/09/2015  . Agitation [R45.1] 09/09/2015  . Depressed [F32.9]   . Chronic pain [G89.29]     Total Time spent with patient: 1 hour  Subjective:   Gilmer Kaminsky is a 65 y.o. male patient admitted with "I really need to stop drinking".  HPI:  Patient interviewed. Chart reviewed including past psychiatric interactions. Labs and vitals reviewed. Case discussed with emergency room physician. 65 year old gentleman was brought into the hospital last night by law enforcement. The paperwork they have filed indicate that when they went to pick him up things were quite a mess. They described his house as being in terrible disarray and say that the patient himself was begging them to shoot him when they showed up. The patient says he does not remember any of this but doesn't dispute it. He is aware that he has blackouts. He says he is been drinking about a pint of liquor every other day and has been doing that for the last month. Things of been bad since his girlfriend left him. He denies that he is using any other drugs currently. He claims that he is still managing to eat okay and has been compliant with his prescribed medicine. Denies having any conscious suicidal thoughts or desires. No homicidal ideation. Patient is currently not complaining of any hallucinations. He is a little bit  tremulous but has been able to eat and drink today and is able to ambulate without being unsteady. Patient is currently denying any acute psychiatric symptoms. He shows at least on the surface insight into the severity of his alcohol problem. He is an outpatient receiving psychiatric care through the [place] in [place].  Social history: Currently living by himself since his girlfriend left him a month ago. Patient is fully disabled and receives a [place] pension.  Medical history: Patient has dyslipidemia. High blood pressure.  Substance abuse history: Long-standing problems with alcohol abuse. Has had multiple detoxes and been engaged in substance abuse treatment in the past. He denies ever having had an alcohol withdrawal seizure or having had delirium tremens. 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  Past Psychiatric History: Diagnosis of PTSD through the [place] which is related to his service in the [employer] during the [place] era. Currently he is on Depakote apparently for mood stabilization. Sees an outpatient psychiatrist at the [facility]. Has had multiple psychiatric admissions at the [facility] mostly including one just a week or 2 ago. He denies ever having tried to kill himself in the past  Risk to Self: Is patient at risk for suicide?: Yes Risk to Others:   Prior Inpatient Therapy:   Prior Outpatient Therapy:    Past Medical History:  Past Medical History  Diagnosis Date  . Depressed   . Chronic pain     Past Surgical History  Procedure Laterality Date  .  Knee surgery    . Hernia repair    . Prostectomy N/A   . Knee surgery    . Back surgery     Family History: No family history on file. Family Psychiatric  History: Patient states there are people with alcohol problems throughout his family. Social History:  History  Alcohol Use No     History  Drug Use No    Social  History   Social History  . Marital Status: Legally Separated    Spouse Name: N/A  . Number of Children: N/A  . Years of Education: N/A   Social History Main Topics  . Smoking status: Never Smoker   . Smokeless tobacco: Not on file  . Alcohol Use: No  . Drug Use: No  . Sexual Activity: Not on file   Other Topics Concern  . Not on file   Social History Narrative   Additional Social History:    Allergies:   Allergies  Allergen Reactions  . Gabapentin Other (See Comments)    seizures  . Penicillins Rash    Labs:  Results for orders placed or performed during the hospital encounter of 09/24/15 (from the past 48 hour(s))  CBC with Differential     Status: Abnormal   Collection Time: 09/24/15  7:40 PM  Result Value Ref Range   WBC 13.8 (H) 3.8 - 10.6 K/uL   RBC 5.06 4.40 - 5.90 MIL/uL   Hemoglobin 13.2 13.0 - 18.0 g/dL   HCT 41.8 40.0 - 52.0 %   MCV 82.5 80.0 - 100.0 fL   MCH 26.1 26.0 - 34.0 pg   MCHC 31.7 (L) 32.0 - 36.0 g/dL   RDW 16.7 (H) 11.5 - 14.5 %   Platelets 328 150 - 440 K/uL   Neutrophils Relative % 71% %   Neutro Abs 9.6 (H) 1.4 - 6.5 K/uL   Lymphocytes Relative 16% %   Lymphs Abs 2.2 1.0 - 3.6 K/uL   Monocytes Relative 11% %   Monocytes Absolute 1.6 (H) 0.2 - 1.0 K/uL   Eosinophils Relative 2% %   Eosinophils Absolute 0.3 0 - 0.7 K/uL   Basophils Relative 0% %   Basophils Absolute 0.0 0 - 0.1 K/uL  Basic metabolic panel     Status: Abnormal   Collection Time: 09/24/15  7:40 PM  Result Value Ref Range   Sodium 142 135 - 145 mmol/L   Potassium 4.5 3.5 - 5.1 mmol/L   Chloride 108 101 - 111 mmol/L   CO2 21 (L) 22 - 32 mmol/L   Glucose, Bld 106 (H) 65 - 99 mg/dL   BUN 29 (H) 6 - 20 mg/dL   Creatinine, Ser 1.88 (H) 0.61 - 1.24 mg/dL   Calcium 8.8 (L) 8.9 - 10.3 mg/dL   GFR calc non Af Amer 36 (L) >60 mL/min   GFR calc Af Amer 41 (L) >60 mL/min    Comment: (NOTE) The eGFR has been calculated using the CKD EPI equation. This calculation has not  been validated in all clinical situations. eGFR's persistently <60 mL/min signify possible Chronic Kidney Disease.    Anion gap 13 5 - 15  Ethanol     Status: Abnormal   Collection Time: 09/24/15  7:40 PM  Result Value Ref Range   Alcohol, Ethyl (B) 214 (H) <5 mg/dL    Comment:        LOWEST DETECTABLE LIMIT FOR SERUM ALCOHOL IS 5 mg/dL FOR MEDICAL PURPOSES ONLY   Salicylate level  Status: None   Collection Time: 09/24/15  7:40 PM  Result Value Ref Range   Salicylate Lvl <9.6 2.8 - 30.0 mg/dL  Acetaminophen level     Status: Abnormal   Collection Time: 09/24/15  7:40 PM  Result Value Ref Range   Acetaminophen (Tylenol), Serum <10 (L) 10 - 30 ug/mL    Comment:        THERAPEUTIC CONCENTRATIONS VARY SIGNIFICANTLY. A RANGE OF 10-30 ug/mL MAY BE AN EFFECTIVE CONCENTRATION FOR MANY PATIENTS. HOWEVER, SOME ARE BEST TREATED AT CONCENTRATIONS OUTSIDE THIS RANGE. ACETAMINOPHEN CONCENTRATIONS >150 ug/mL AT 4 HOURS AFTER INGESTION AND >50 ug/mL AT 12 HOURS AFTER INGESTION ARE OFTEN ASSOCIATED WITH TOXIC REACTIONS.   Valproic acid level     Status: Abnormal   Collection Time: 09/24/15  7:40 PM  Result Value Ref Range   Valproic Acid Lvl 29 (L) 50.0 - 100.0 ug/mL    Current Facility-Administered Medications  Medication Dose Route Frequency Provider Last Rate Last Dose  . divalproex (DEPAKOTE ER) 24 hr tablet 1,000 mg  1,000 mg Oral BID Joanne Gavel, MD   1,000 mg at 09/25/15 1132  . DULoxetine (CYMBALTA) DR capsule 30 mg  30 mg Oral Daily Joanne Gavel, MD   30 mg at 09/25/15 1304  . lisinopril (PRINIVIL,ZESTRIL) tablet 10 mg  10 mg Oral Daily Joanne Gavel, MD   10 mg at 09/25/15 1132  . pravastatin (PRAVACHOL) tablet 80 mg  80 mg Oral QHS Joanne Gavel, MD       Current Outpatient Prescriptions  Medication Sig Dispense Refill  . clobetasol cream (TEMOVATE) 2.95 % Apply 1 application topically 2 (two) times daily.    . divalproex (DEPAKOTE ER) 500 MG 24 hr tablet Take  1,000 mg by mouth 2 (two) times daily.     . DULoxetine (CYMBALTA) 30 MG capsule Take 30 mg by mouth daily.     Marland Kitchen ibuprofen (ADVIL,MOTRIN) 800 MG tablet Take 800 mg by mouth every 8 (eight) hours as needed.    Marland Kitchen lisinopril (PRINIVIL,ZESTRIL) 10 MG tablet Take 10 mg by mouth daily.    Marland Kitchen nystatin ointment (MYCOSTATIN) Apply 1 application topically 2 (two) times daily.    . pravastatin (PRAVACHOL) 80 MG tablet Take 80 mg by mouth at bedtime.     Marland Kitchen tolnaftate (TINACTIN) 1 % powder Apply 1 application topically 2 (two) times daily.      Musculoskeletal: Strength & Muscle Tone: decreased Gait & Station: normal Patient leans: N/A  Psychiatric Specialty Exam: Review of Systems  Constitutional: Positive for malaise/fatigue.  HENT: Negative.   Eyes: Negative.   Respiratory: Negative.   Cardiovascular: Negative.   Gastrointestinal: Negative.   Musculoskeletal: Negative.   Skin: Negative.   Neurological: Positive for tremors and weakness.  Psychiatric/Behavioral: Positive for memory loss and substance abuse. Negative for depression, suicidal ideas and hallucinations. The patient has insomnia. The patient is not nervous/anxious.     Blood pressure 121/68, pulse 96, temperature 97.6 F (36.4 C), temperature source Oral, resp. rate 18, SpO2 97 %.There is no weight on file to calculate BMI.  General Appearance: Disheveled  Eye Contact::  Good  Speech:  Slow  Volume:  Decreased  Mood:  Euthymic  Affect:  Blunt  Thought Process:  Goal Directed  Orientation:  Full (Time, Place, and Person)  Thought Content:  Negative  Suicidal Thoughts:  No  Homicidal Thoughts:  No  Memory:  Immediate;   Good Recent;   Fair Remote;   Fair  Judgement:  Fair  Insight:  Fair  Psychomotor Activity:  Normal  Concentration:  Fair  Recall:  [employer] of Knowledge:Fair  Language: Good  Akathisia:  No  Handed:  Right  AIMS (if indicated):     Assets:  Communication Skills Desire for Improvement Financial  Resources/Insurance Housing Social Support  ADL's:  Intact  Cognition: WNL  Sleep:      Treatment Plan Summary: Plan 65 year old man came into the hospital intoxicated with elevated alcohol level last night. Apparently behaving in a pretty crazy manner while he was intoxicated. He has slept it off here in the emergency room. On evaluation today he is easily arousable awake alert and cooperative. Short-term memory intact. Insight reasonably good. Patient understands he has been drinking too much but says he has never had seizures or delirium tremens. Vital signs are currently stable. No sign of delirium. Completely denies suicidal or homicidal ideation. Patient does not meet criteria for admission to acute psychiatric ward. We had a discussion about the obvious dangers involved in blackouts and how important it would be to get this under control. Reviewed some things that he can do to try and decrease his chance of relapsing at home. Encouraged him to stay on his outpatient medicine and to follow-up as soon as possible with his psychiatrist in [place]. Patient agreed to all of this. IVC discontinued. No new prescriptions. Case reviewed with emergency room doctor and he can be released.  Disposition: Patient does not meet criteria for psychiatric inpatient admission. Discussed crisis plan, support from social network, calling 911, coming to the Emergency Department, and calling Suicide Hotline.  Alethia Berthold, MD 09/25/2015 2:51 PM

## 2018-08-22 ENCOUNTER — Emergency Department
Admission: EM | Admit: 2018-08-22 | Discharge: 2018-08-24 | Disposition: A | Discharge status: Other Acute Inpatient Hospital | Payer: Medicare HMO | Attending: Emergency Medicine | Admitting: Emergency Medicine

## 2018-08-22 ENCOUNTER — Other Ambulatory Visit: Payer: Self-pay

## 2018-08-22 ENCOUNTER — Encounter: Payer: Self-pay | Admitting: Emergency Medicine

## 2018-08-22 DIAGNOSIS — F4310 Post-traumatic stress disorder, unspecified: Secondary | ICD-10-CM | POA: Diagnosis not present

## 2018-08-22 DIAGNOSIS — T402X4A Poisoning by other opioids, undetermined, initial encounter: Secondary | ICD-10-CM | POA: Insufficient documentation

## 2018-08-22 DIAGNOSIS — F1010 Alcohol abuse, uncomplicated: Secondary | ICD-10-CM | POA: Diagnosis not present

## 2018-08-22 DIAGNOSIS — Z79899 Other long term (current) drug therapy: Secondary | ICD-10-CM | POA: Diagnosis not present

## 2018-08-22 DIAGNOSIS — R451 Restlessness and agitation: Secondary | ICD-10-CM | POA: Diagnosis present

## 2018-08-22 DIAGNOSIS — T50994A Poisoning by other drugs, medicaments and biological substances, undetermined, initial encounter: Secondary | ICD-10-CM | POA: Diagnosis not present

## 2018-08-22 DIAGNOSIS — F329 Major depressive disorder, single episode, unspecified: Secondary | ICD-10-CM | POA: Insufficient documentation

## 2018-08-22 DIAGNOSIS — F10220 Alcohol dependence with intoxication, uncomplicated: Secondary | ICD-10-CM | POA: Diagnosis present

## 2018-08-22 DIAGNOSIS — F1020 Alcohol dependence, uncomplicated: Secondary | ICD-10-CM

## 2018-08-22 DIAGNOSIS — F10288 Alcohol dependence with other alcohol-induced disorder: Secondary | ICD-10-CM | POA: Diagnosis not present

## 2018-08-22 DIAGNOSIS — F332 Major depressive disorder, recurrent severe without psychotic features: Secondary | ICD-10-CM | POA: Diagnosis not present

## 2018-08-22 DIAGNOSIS — T50904A Poisoning by unspecified drugs, medicaments and biological substances, undetermined, initial encounter: Secondary | ICD-10-CM

## 2018-08-22 LAB — URINE DRUG SCREEN, QUALITATIVE (ARMC ONLY)
Amphetamines, Ur Screen: NOT DETECTED
Barbiturates, Ur Screen: NOT DETECTED
Benzodiazepine, Ur Scrn: POSITIVE — AB
Cannabinoid 50 Ng, Ur ~~LOC~~: NOT DETECTED
Cocaine Metabolite,Ur ~~LOC~~: NOT DETECTED
MDMA (Ecstasy)Ur Screen: NOT DETECTED
Methadone Scn, Ur: NOT DETECTED
Opiate, Ur Screen: POSITIVE — AB
Phencyclidine (PCP) Ur S: NOT DETECTED
Tricyclic, Ur Screen: NOT DETECTED

## 2018-08-22 LAB — COMPREHENSIVE METABOLIC PANEL
ALT: 19 U/L (ref 0–44)
AST: 18 U/L (ref 15–41)
Albumin: 3.2 g/dL — ABNORMAL LOW (ref 3.5–5.0)
Alkaline Phosphatase: 92 U/L (ref 38–126)
Anion gap: 11 (ref 5–15)
BUN: 22 mg/dL (ref 8–23)
CO2: 23 mmol/L (ref 22–32)
Calcium: 8.8 mg/dL — ABNORMAL LOW (ref 8.9–10.3)
Chloride: 103 mmol/L (ref 98–111)
Creatinine, Ser: 2.08 mg/dL — ABNORMAL HIGH (ref 0.61–1.24)
GFR calc Af Amer: 37 mL/min — ABNORMAL LOW (ref >60)
GFR calc non Af Amer: 32 mL/min — ABNORMAL LOW (ref >60)
Glucose, Bld: 140 mg/dL — ABNORMAL HIGH (ref 70–99)
Potassium: 4 mmol/L (ref 3.5–5.1)
Sodium: 137 mmol/L (ref 135–145)
Total Bilirubin: 0.7 mg/dL (ref 0.3–1.2)
Total Protein: 6.8 g/dL (ref 6.5–8.1)

## 2018-08-22 LAB — CBC WITH DIFFERENTIAL/PLATELET
Abs Immature Granulocytes: 0.58 10*3/uL — ABNORMAL HIGH (ref 0.00–0.07)
Basophils Absolute: 0.2 10*3/uL — ABNORMAL HIGH (ref 0.0–0.1)
Basophils Relative: 1 %
Eosinophils Absolute: 1.3 10*3/uL — ABNORMAL HIGH (ref 0.0–0.5)
Eosinophils Relative: 9 %
HCT: 29.8 % — ABNORMAL LOW (ref 39.0–52.0)
Hemoglobin: 8.7 g/dL — ABNORMAL LOW (ref 13.0–17.0)
Immature Granulocytes: 4 %
Lymphocytes Relative: 12 %
Lymphs Abs: 1.7 10*3/uL (ref 0.7–4.0)
MCH: 21.9 pg — ABNORMAL LOW (ref 26.0–34.0)
MCHC: 29.2 g/dL — ABNORMAL LOW (ref 30.0–36.0)
MCV: 75.1 fL — ABNORMAL LOW (ref 80.0–100.0)
Monocytes Absolute: 1.8 10*3/uL — ABNORMAL HIGH (ref 0.1–1.0)
Monocytes Relative: 12 %
Neutro Abs: 9.1 10*3/uL — ABNORMAL HIGH (ref 1.7–7.7)
Neutrophils Relative %: 62 %
Platelets: 381 10*3/uL (ref 150–400)
RBC: 3.97 MIL/uL — ABNORMAL LOW (ref 4.22–5.81)
RDW: 22.7 % — ABNORMAL HIGH (ref 11.5–15.5)
WBC: 14.7 10*3/uL — ABNORMAL HIGH (ref 4.0–10.5)
nRBC: 0.2 % (ref 0.0–0.2)

## 2018-08-22 LAB — ETHANOL: Alcohol, Ethyl (B): <10 mg/dL (ref <10)

## 2018-08-22 LAB — ACETAMINOPHEN LEVEL
Acetaminophen (Tylenol), Serum: <10 ug/mL — ABNORMAL LOW (ref 10–30)
Acetaminophen (Tylenol), Serum: <10 ug/mL — ABNORMAL LOW (ref 10–30)

## 2018-08-22 LAB — SALICYLATE LEVEL: Salicylate Lvl: <7 mg/dL (ref 2.8–30.0)

## 2018-08-22 MED ORDER — LORAZEPAM 2 MG/ML IJ SOLN
1.0000 mg | Freq: Once | INTRAMUSCULAR | Status: AC
  Administered 2018-08-22: 1 mg via INTRAVENOUS

## 2018-08-22 MED ORDER — LORAZEPAM 2 MG/ML IJ SOLN
INTRAMUSCULAR | Status: AC
  Administered 2018-08-22: 1 mg via INTRAVENOUS
  Filled 2018-08-22: qty 1

## 2018-08-22 NOTE — ED Provider Notes (Addendum)
[facility] Emergency Department Provider Note   ____________________________________________   I have reviewed the triage vital signs and the nursing notes.   HISTORY  Chief Complaint Drug Overdose   History limited by: Not Limited   HPI Omar Davis is a 68 y.o. male who presents to the emergency department today after overdose. The patient states that he took 6 of his oxycodone tonight to help with his pain. Recently had brain surgery. He thinks that his significant other called because he was making comments about how it might be better if he wasn't around because no one loves him. He denies any recent fevers, chest pain, abd pain.    Records reviewed. Per medical record review patient has a history of depression, SI, alcohol abuse.   Past Medical History:  Diagnosis Date  . Chronic pain   . Depressed     Patient Active Problem List   Diagnosis Date Noted  . Suicidal ideation 09/25/2015  . Substance induced mood disorder ([place]) 09/09/2015  . Alcohol abuse 09/09/2015  . PTSD (post-traumatic stress disorder) 09/09/2015  . Agitation 09/09/2015  . Depressed   . Chronic pain     Past Surgical History:  Procedure Laterality Date  . BACK SURGERY    . HERNIA REPAIR    . KNEE SURGERY    . KNEE SURGERY    . prostectomy N/A     Prior to Admission medications   Medication Sig Start Date End Date Taking? Authorizing Provider  clobetasol cream (TEMOVATE) 9.98 % Apply 1 application topically 2 (two) times daily.    [provider]  divalproex (DEPAKOTE ER) 500 MG 24 hr tablet Take 1,000 mg by mouth 2 (two) times daily.     [provider]  DULoxetine (CYMBALTA) 30 MG capsule Take 30 mg by mouth daily.     [provider]  ibuprofen (ADVIL,MOTRIN) 800 MG tablet Take 800 mg by mouth every 8 (eight) hours as needed.    [provider]  lisinopril (PRINIVIL,ZESTRIL) 10 MG tablet Take 10 mg by mouth daily.     [provider]  nystatin ointment (MYCOSTATIN) Apply 1 application topically 2 (two) times daily.    [provider]  pravastatin (PRAVACHOL) 80 MG tablet Take 80 mg by mouth at bedtime.     [provider]  tolnaftate (TINACTIN) 1 % powder Apply 1 application topically 2 (two) times daily.    [provider]    Allergies Gabapentin and Penicillins  History reviewed. No pertinent family history.  Social History Social History   Tobacco Use  . Smoking status: Never Smoker  Substance Use Topics  . Alcohol use: No  . Drug use: No    Review of Systems Constitutional: No fever/chills Eyes: No visual changes. ENT: No sore throat. Cardiovascular: Denies chest pain. Respiratory: Denies shortness of breath. Gastrointestinal: No abdominal pain.  No nausea, no vomiting.  No diarrhea.   Genitourinary: Negative for dysuria. Musculoskeletal: Negative for back pain. Skin: Negative for rash. Neurological: Negative for headaches, focal weakness or numbness.  ____________________________________________   PHYSICAL EXAM:  VITAL SIGNS: ED Triage Vitals  Enc Vitals Group     BP 08/22/18 2010 127/79     Pulse Rate 08/22/18 2010 89     Resp 08/22/18 2010 16     Temp 08/22/18 2005 98.4 F (36.9 C)     Temp Source 08/22/18 2005 Oral     SpO2 08/22/18 2010 98 %     Weight  08/22/18 2005 155 lb (70.3 kg)     Height 08/22/18 2005 5\' 8"  (1.727 m)     Head Circumference --      Peak Flow --      Pain Score 08/22/18 2005 0   Constitutional: Alert and oriented.  Eyes: Conjunctivae are normal.  ENT      Head: Normocephalic and atraumatic.      Nose: No congestion/rhinnorhea.      Mouth/Throat: Mucous membranes are moist.      Neck: No stridor. Hematological/Lymphatic/Immunilogical: No cervical lymphadenopathy. Cardiovascular: Normal rate, regular rhythm.  No murmurs, rubs, or gallops.  Respiratory: Normal respiratory effort without tachypnea nor  retractions. Breath sounds are clear and equal bilaterally. No wheezes/rales/rhonchi. Gastrointestinal: Soft and non tender. No rebound. No guarding.  Genitourinary: Deferred Musculoskeletal: Normal range of motion in all extremities. No lower extremity edema. Neurologic:  Normal speech and language. No gross focal neurologic deficits are appreciated.  Skin:  Well hearing surgical incision scar to scalp.  Psychiatric: Mood and affect are normal. Speech and behavior are normal. Patient exhibits appropriate insight and judgment.  ____________________________________________    LABS (pertinent positives/negatives)  CBC wbc 14.7, hgb 8.7, plt 616 Salicylate, acetaminophen, ethanol below threshold CMP na 137, k 4.0, glu 140, cr 2.08  ____________________________________________   EKG  I, Nance Pear, attending physician, personally viewed and interpreted this EKG  EKG Time: 2005 Rate: 91 Rhythm: sinus rhythm Axis: normal Intervals: qtc 467 QRS: narrow ST changes: no st elevation Impression: normal ekg   ____________________________________________    RADIOLOGY  None  ____________________________________________   PROCEDURES  Procedures  ____________________________________________   INITIAL IMPRESSION / ASSESSMENT AND PLAN / ED COURSE  Pertinent labs & imaging results that were available during my care of the patient were reviewed by me and considered in my medical decision making (see chart for details).   Patient presented to the emergency department today and under IVC after an intentional overdose of narcotics.  Patient states he was doing it all the pain however he does have history of some depression and states he told his significant other that he thought might be better if he was not around.  On exam patient is awake and alert.  Initial Tylenol level negative however given patient stated amount will repeat.  Will have psychiatry evaluate. hgb was low  today at 8.7, per care everywhere he appears to have baseline closer to 10. He denies any bloody stool or black tarry stool. GUIAC was negative. Will plan on repeating.   ___________________________________________   FINAL CLINICAL IMPRESSION(S) / ED DIAGNOSES  Final diagnoses:  Drug overdose, undetermined intent, initial encounter  Depression, unspecified depression type     Note: This dictation was prepared with Dragon dictation. Any transcriptional errors that result from this process are unintentional     Nance Pear, MD 08/22/18 0737    Nance Pear, MD 08/22/18 [phone]

## 2018-08-22 NOTE — ED Triage Notes (Signed)
Pt presents from [facility] with c/o overdose. Pt took 13 oxycodone today. Denies thoughts of hurting self or others, pt stated to ems "I just wanted to numb the pain". Pt is a recovering alcoholic. Pt currently alert and responding to questions appropriately. Pt denies pain at the moment.

## 2018-08-22 NOTE — ED Notes (Signed)
Personal medications brought in with pt sent to pharmacy for storage.

## 2018-08-23 DIAGNOSIS — R451 Restlessness and agitation: Secondary | ICD-10-CM

## 2018-08-23 DIAGNOSIS — T1491XA Suicide attempt, initial encounter: Secondary | ICD-10-CM

## 2018-08-23 DIAGNOSIS — F1010 Alcohol abuse, uncomplicated: Secondary | ICD-10-CM | POA: Diagnosis not present

## 2018-08-23 DIAGNOSIS — F4310 Post-traumatic stress disorder, unspecified: Secondary | ICD-10-CM | POA: Diagnosis not present

## 2018-08-23 LAB — CBC WITH DIFFERENTIAL/PLATELET
Abs Immature Granulocytes: 0.29 10*3/uL — ABNORMAL HIGH (ref 0.00–0.07)
Basophils Absolute: 0.1 10*3/uL (ref 0.0–0.1)
Basophils Relative: 1 %
Eosinophils Absolute: 1 10*3/uL — ABNORMAL HIGH (ref 0.0–0.5)
Eosinophils Relative: 9 %
HCT: 27.7 % — ABNORMAL LOW (ref 39.0–52.0)
Hemoglobin: 8 g/dL — ABNORMAL LOW (ref 13.0–17.0)
Immature Granulocytes: 3 %
Lymphocytes Relative: 12 %
Lymphs Abs: 1.2 10*3/uL (ref 0.7–4.0)
MCH: 21.7 pg — ABNORMAL LOW (ref 26.0–34.0)
MCHC: 28.9 g/dL — ABNORMAL LOW (ref 30.0–36.0)
MCV: 75.3 fL — ABNORMAL LOW (ref 80.0–100.0)
Monocytes Absolute: 1.2 10*3/uL — ABNORMAL HIGH (ref 0.1–1.0)
Monocytes Relative: 11 %
Neutro Abs: 6.5 10*3/uL (ref 1.7–7.7)
Neutrophils Relative %: 64 %
Platelets: 298 10*3/uL (ref 150–400)
RBC: 3.68 MIL/uL — ABNORMAL LOW (ref 4.22–5.81)
RDW: 22.5 % — ABNORMAL HIGH (ref 11.5–15.5)
WBC: 10.2 10*3/uL (ref 4.0–10.5)
nRBC: 0 % (ref 0.0–0.2)

## 2018-08-23 LAB — BASIC METABOLIC PANEL
Anion gap: 8 (ref 5–15)
BUN: 19 mg/dL (ref 8–23)
CO2: 24 mmol/L (ref 22–32)
Calcium: 8.3 mg/dL — ABNORMAL LOW (ref 8.9–10.3)
Chloride: 100 mmol/L (ref 98–111)
Creatinine, Ser: 0.89 mg/dL (ref 0.61–1.24)
GFR calc Af Amer: >60 mL/min (ref >60)
GFR calc non Af Amer: >60 mL/min (ref >60)
Glucose, Bld: 102 mg/dL — ABNORMAL HIGH (ref 70–99)
Potassium: 4 mmol/L (ref 3.5–5.1)
Sodium: 132 mmol/L — ABNORMAL LOW (ref 135–145)

## 2018-08-23 MED ORDER — PRAZOSIN HCL 1 MG PO CAPS
1.0000 mg | ORAL_CAPSULE | Freq: Every day | ORAL | Status: DC
  Administered 2018-08-23: 1 mg via ORAL
  Filled 2018-08-23 (×2): qty 1

## 2018-08-23 MED ORDER — ACETAMINOPHEN 500 MG PO TABS
1000.0000 mg | ORAL_TABLET | Freq: Once | ORAL | Status: AC
  Administered 2018-08-23: 1000 mg via ORAL

## 2018-08-23 MED ORDER — ACETAMINOPHEN 500 MG PO TABS
ORAL_TABLET | ORAL | Status: AC
  Filled 2018-08-23: qty 2

## 2018-08-23 MED ORDER — FLUOXETINE HCL 20 MG PO CAPS
40.0000 mg | ORAL_CAPSULE | Freq: Every day | ORAL | Status: DC
  Administered 2018-08-23 – 2018-08-24 (×2): 40 mg via ORAL
  Filled 2018-08-23 (×2): qty 2

## 2018-08-23 NOTE — ED Notes (Signed)
Breakfast tray and lunch tray at bedside. Pt refuses to eat. Pt st "I'm not hungry". This RN provided pt with a cup of water. Pt was able to drink 4oz of water.

## 2018-08-23 NOTE — ED Notes (Signed)
Pt. Transferred to room 26 to Room 20 to room after screening for contraband. Report to include Situation, Background, Assessment and Recommendations from [facility]. Pt. Oriented to unit including Q15 minute rounds as well as the security cameras for their protection. Patient is alert and oriented, warm and dry in no acute distress. Patient denies SI, HI, and AVH. Pt. Encouraged to let me know if needs arise.

## 2018-08-23 NOTE — ED Notes (Signed)
Pt refusing to keep blood pressure cuff and pulse ox cord on at this time. MD aware.

## 2018-08-23 NOTE — BH Specialist Note (Cosign Needed Addendum)
Omar Davis is an 68 y.o. male patient were unable to be assess due to him being overly sedated from overdosing on pain medications. If the patient should awaken later on into the morning the patient will be assess. Per HPI; The patient states that he took 6 of his oxycodone tonight to help with his pain. Recently had brain surgery.

## 2018-08-23 NOTE — ED Notes (Signed)
This RN rovided pt with a phone to call his significant other. Phone was placed and pt used the phone for 5 minutes in accordance to behavioral dept phone call hours.

## 2018-08-23 NOTE — ED Notes (Signed)
Hourly rounding reveals patient in room. No complaints, stable, in no acute distress. Q15 minute rounds and monitoring via Rover and Officer to continue.   

## 2018-08-23 NOTE — ED Notes (Signed)
Pt is able to ambulate in room with a steady gait at this time. Pt is A/o x4. NAD noted at this time.

## 2018-08-23 NOTE — ED Notes (Signed)
Pt provided with a meal tray at this time.

## 2018-08-23 NOTE — ED Notes (Signed)
Patient changed into hospital provided clothing by this RN and Zach NT. Patient's belonging's bagged and labeled. Belonging's include:  Blue sweatshirt, jeans, sneakers, blue socks, brown belt.

## 2018-08-23 NOTE — BH Assessment (Signed)
Assessment Note  Omar Davis is an 68 y.o. male Pt unable to be seen due to sedation from OD of pain medications. TTS will check back in the morning for assessment.   Diagnosis: Major Depressive Disorder  Past Medical History:  Past Medical History:  Diagnosis Date  . Chronic pain   . Depressed     Past Surgical History:  Procedure Laterality Date  . BACK SURGERY    . HERNIA REPAIR    . KNEE SURGERY    . KNEE SURGERY    . prostectomy N/A     Family History: History reviewed. No pertinent family history.  Social History:  reports that he has never smoked. He does not have any smokeless tobacco history on file. He reports that he does not drink alcohol or use drugs.  Additional Social History:     CIWA: CIWA-Ar BP: 124/73 Pulse Rate: 93 COWS:    Allergies:  Allergies  Allergen Reactions  . Gabapentin Other (See Comments)    seizures  . Penicillins Rash    Home Medications: (Not in a hospital admission)   OB/GYN Status:  No LMP for male patient.  General Assessment Data Assessment unable to be completed: Yes Reason for not completing assessment: Pt sedated due to [place] ([facility]) Does Patient Have a Medical Advance Directive?: No Would patient like information on creating a medical advance directive?: No - Patient declined          Disposition:     On Site Evaluation by:   Reviewed with Physician:    Tandra Rosado D Nandan Willems 08/23/2018 2:19 AM
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
## 2018-08-23 NOTE — ED Provider Notes (Signed)
-----------------------------------------   1:24 AM on 08/23/2018 -----------------------------------------  Repeat acetaminophen level remains unremarkable.  Have placed consult to psychiatry to evaluate.  Repeat CBC in the morning.   ----------------------------------------- 1:56 AM on 08/23/2018 -----------------------------------------  Patient was too sleepy to speak with psychiatric NP or TTS.  Will reattempt interviewing the patient once he is awake.   ----------------------------------------- 6:48 AM on 08/23/2018 -----------------------------------------  No further events overnight.  Awaiting psychiatric evaluation and disposition this morning.  Has repeat CBC this morning as well.   Paulette Blanch, MD 08/23/18 [phone]

## 2018-08-24 DIAGNOSIS — T50994A Poisoning by other drugs, medicaments and biological substances, undetermined, initial encounter: Secondary | ICD-10-CM

## 2018-08-24 DIAGNOSIS — F1020 Alcohol dependence, uncomplicated: Secondary | ICD-10-CM

## 2018-08-24 DIAGNOSIS — F332 Major depressive disorder, recurrent severe without psychotic features: Secondary | ICD-10-CM

## 2018-08-24 DIAGNOSIS — F4310 Post-traumatic stress disorder, unspecified: Secondary | ICD-10-CM

## 2018-08-24 DIAGNOSIS — F10288 Alcohol dependence with other alcohol-induced disorder: Secondary | ICD-10-CM

## 2018-08-24 DIAGNOSIS — F10220 Alcohol dependence with intoxication, uncomplicated: Secondary | ICD-10-CM | POA: Diagnosis present

## 2018-08-24 MED ORDER — ACETAMINOPHEN 325 MG PO TABS
650.0000 mg | ORAL_TABLET | Freq: Four times a day (QID) | ORAL | Status: DC | PRN
  Administered 2018-08-24: 650 mg via ORAL

## 2018-08-24 MED ORDER — IBUPROFEN 600 MG PO TABS: 600.0000 mg | ORAL_TABLET | Freq: Four times a day (QID) | ORAL | Status: DC | PRN

## 2018-08-24 NOTE — BH Assessment (Addendum)
Patient has been accepted to [facility].  Patient assigned to unit 9A. Accepting physician is Dr. Donzetta Starch.  Call report to [phone] ext. [phone].  Representative was Shanon Brow Research scientist (medical)).   ER Staff is aware of it:  Holley Raring, ER Secretary  Dr. Joni Fears, ER MD  Donneta Romberg, Patient's Nurse

## 2018-08-24 NOTE — BH Assessment (Signed)
Pt faxed out to [facility] for review

## 2018-08-24 NOTE — ED Provider Notes (Signed)
-----------------------------------------   8:58 AM on 08/24/2018 -----------------------------------------   Blood pressure 135/80, pulse 70, temperature 98.7 F (37.1 C), temperature source Oral, resp. rate 16, height 5\' 8"  (1.727 m), weight 70.3 kg, SpO2 99 %.  The patient had no acute events since last update.  Calm and cooperative at this time.  Disposition is pending per Psychiatry/Behavioral Medicine team recommendations.     Alfred Levins, [place], MD 08/24/18 [phone]

## 2018-08-24 NOTE — Consult Note (Signed)
[place] [facility] Consult   Reason for Consult:  overdose Referring Physician:  Dr. 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.  HPI:  Omar Davis is a 68 y.o. male patient presented to the ED after an overdose of his pain medication. He told his girlfriend he overdosed and she called 911.  She also told the patient he could not return until he received help for his substance abuse and depression.  Prior to this incident, he was threatening to kill himself via overdose to her.   Patient pleasant and wants to get help for his substance abuse and depression.  [facility] is willing to take him and he will transfer when paper work is completed.  Past Psychiatric History: PTSD follows at [facility]; Alcoholism with a fall 08/18/2018 with subsequent headache  Stopped Depakote and Cymbalta due to being not effective Now on Prozac 40 mg, has run out of minipress 1 mg at bedtime approximately 2 months ago.  Risk to Self:  Yes Risk to Others:  Yes Prior Inpatient Therapy:  Yes Prior Outpatient Therapy:   Yes  From record review with updates: Social history: Currently living by himself since his girlfriend left him a month ago. Patient is fully disabled and receives a [place] pension.  Medical history: Patient has dyslipidemia. High blood pressure.  Substance abuse  history: Long-standing problems with alcohol abuse. Has had multiple detoxes and been engaged in substance abuse treatment in the past. He denies ever having had an alcohol withdrawal seizure or having had delirium tremens. 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  Past Psychiatric History: Diagnosis of PTSD through the [place] which is related to his service in the [employer] during the [place] era. Currently he is on Depakote apparently for mood stabilization. Sees an outpatient psychiatrist at the [facility]. Has had multiple psychiatric admissions at the [facility] mostly including one just a week or 2 ago. Suicide attempt by jumping off a bridge in 2003 which was halted by a driver that stopped.   Past Medical History:  Past Medical History:  Diagnosis Date  . Chronic pain   . Depressed     Past Surgical History:  Procedure Laterality Date  . BACK SURGERY    . HERNIA REPAIR    . KNEE SURGERY    . KNEE SURGERY    . prostectomy N/A    Family History: History reviewed. No pertinent family history. Family Psychiatric  History: Alcoholism No suicides in family.  Social History:  Social History   Substance and Sexual Activity  Alcohol Use No     Social History   Substance and Sexual Activity  Drug Use No    Social History   Socioeconomic History  . Marital status: Legally Separated    Spouse name: Not on file  .  Number of children: Not on file  . Years of education: Not on file  . Highest education level: Not on file  Occupational History  . Not on file  Social Needs  . Financial resource strain: Not on file  . Food insecurity:    Worry: Not on file    Inability: Not on file  . Transportation needs:    Medical: Not on file    Non-medical: Not on file  Tobacco Use  . Smoking status: Never Smoker  Substance and Sexual Activity  . Alcohol use: No  . Drug use: No   . Sexual activity: Not on file  Lifestyle  . Physical activity:    Days per week: Not on file    Minutes per session: Not on file  . Stress: Not on file  Relationships  . Social connections:    Talks on phone: Not on file    Gets together: Not on file    Attends religious service: Not on file    Active member of club or organization: Not on file    Attends meetings of clubs or organizations: Not on file    Relationship status: Not on file  Other Topics Concern  . Not on file  Social History Narrative  . Not on file   Additional Social History: Drinks 1/2 pint to a quart vodka daily for the past 2 years, "because I was bored"    Allergies:   Allergies  Allergen Reactions  . Codeine Hives  . Gabapentin Other (See Comments)    seizures  . Penicillins Rash    Labs:  Results for orders placed or performed during the hospital encounter of 08/22/18 (from the past 48 hour(s))  CBC with Differential     Status: Abnormal   Collection Time: 08/22/18  8:08 PM  Result Value Ref Range   WBC 14.7 (H) 4.0 - 10.5 K/uL   RBC 3.97 (L) 4.22 - 5.81 MIL/uL   Hemoglobin 8.7 (L) 13.0 - 17.0 g/dL    Comment: Reticulocyte Hemoglobin testing may be clinically indicated, consider ordering this additional test [id number]    HCT 29.8 (L) 39.0 - 52.0 %   MCV 75.1 (L) 80.0 - 100.0 fL   MCH 21.9 (L) 26.0 - 34.0 pg   MCHC 29.2 (L) 30.0 - 36.0 g/dL   RDW 22.7 (H) 11.5 - 15.5 %   Platelets 381 150 - 400 K/uL   nRBC 0.2 0.0 - 0.2 %   Neutrophils Relative % 62 %   Neutro Abs 9.1 (H) 1.7 - 7.7 K/uL   Lymphocytes Relative 12 %   Lymphs Abs 1.7 0.7 - 4.0 K/uL   Monocytes Relative 12 %   Monocytes Absolute 1.8 (H) 0.1 - 1.0 K/uL   Eosinophils Relative 9 %   Eosinophils Absolute 1.3 (H) 0.0 - 0.5 K/uL   Basophils Relative 1 %   Basophils Absolute 0.2 (H) 0.0 - 0.1 K/uL   Immature Granulocytes 4 %   Abs Immature Granulocytes 0.58 (H) 0.00 - 0.07 K/uL    Comment: Performed at [facility], [address], [place],  [address]  Comprehensive metabolic panel     Status: Abnormal   Collection Time: 08/22/18  8:08 PM  Result Value Ref Range   Sodium 137 135 - 145 mmol/L   Potassium 4.0 3.5 - 5.1 mmol/L   Chloride 103 98 - 111 mmol/L   CO2 23 22 - 32 mmol/L   Glucose, Bld 140 (H) 70 - 99  mg/dL   BUN 22 8 - 23 mg/dL   Creatinine, Ser 2.08 (H) 0.61 - 1.24 mg/dL   Calcium 8.8 (L) 8.9 - 10.3 mg/dL   Total Protein 6.8 6.5 - 8.1 g/dL   Albumin 3.2 (L) 3.5 - 5.0 g/dL   AST 18 15 - 41 U/L   ALT 19 0 - 44 U/L   Alkaline Phosphatase 92 38 - 126 U/L   Total Bilirubin 0.7 0.3 - 1.2 mg/dL   GFR calc non Af Amer 32 (L) >60 mL/min   GFR calc Af Amer 37 (L) >60 mL/min   Anion gap 11 5 - 15    Comment: Performed at [facility], [address] [place], [place] [address]  Acetaminophen level     Status: Abnormal   Collection Time: 08/22/18  8:08 PM  Result Value Ref Range   Acetaminophen (Tylenol), Serum <10 (L) 10 - 30 ug/mL    Comment: (NOTE) Therapeutic concentrations vary significantly. A range of 10-30 ug/mL  may be an effective concentration for many patients. However, some  are best treated at concentrations outside of this range. Acetaminophen concentrations >150 ug/mL at 4 hours after ingestion  and >50 ug/mL at 12 hours after ingestion are often associated with  toxic reactions. Performed at [facility], [address] [place], [place] [address]   Salicylate level     Status: None   Collection Time: 08/22/18  8:08 PM  Result Value Ref Range   Salicylate Lvl <0.9 2.8 - 30.0 mg/dL    Comment: Performed at [facility], [address] [place], [place] [address]  Ethanol     Status: None   Collection Time: 08/22/18  8:08 PM  Result Value Ref Range   Alcohol, Ethyl (B) <10 <10 mg/dL    Comment: (NOTE) Lowest detectable limit for serum alcohol is 10 mg/dL. For medical purposes only. Performed at [facility], [address], [place], [place] [address]   Urine Drug Screen, [facility] only)     Status: Abnormal   Collection Time: 08/22/18 10:42 PM  Result Value Ref Range   Tricyclic, Ur Screen NONE DETECTED NONE DETECTED   Amphetamines, Ur Screen NONE DETECTED NONE DETECTED   MDMA (Ecstasy)Ur Screen NONE DETECTED NONE DETECTED   Cocaine Metabolite,Ur [place] NONE DETECTED NONE DETECTED   Opiate, Ur Screen POSITIVE (A) NONE DETECTED   Phencyclidine (PCP) Ur S NONE DETECTED NONE DETECTED   Cannabinoid 50 Ng, Ur [place] NONE DETECTED NONE DETECTED   Barbiturates, Ur Screen NONE DETECTED NONE DETECTED   Benzodiazepine, Ur Scrn POSITIVE (A) NONE DETECTED   Methadone Scn, Ur NONE DETECTED NONE DETECTED    Comment: (NOTE) Tricyclics + metabolites, urine    Cutoff 1000 ng/mL Amphetamines + metabolites, urine  Cutoff 1000 ng/mL MDMA (Ecstasy), urine              Cutoff 500 ng/mL Cocaine Metabolite, urine          Cutoff 300 ng/mL Opiate + metabolites, urine        Cutoff 300 ng/mL Phencyclidine (PCP), urine         Cutoff 25 ng/mL Cannabinoid, urine                 Cutoff 50 ng/mL Barbiturates + metabolites, urine  Cutoff 200 ng/mL Benzodiazepine, urine              Cutoff 200 ng/mL Methadone, urine  Cutoff 300 ng/mL The urine drug screen provides only a preliminary, unconfirmed analytical test result and should not be used for non-medical purposes. Clinical consideration and professional judgment should be applied to any positive drug screen result due to possible interfering substances. A more specific alternate chemical method must be used in order to obtain a confirmed analytical result. Gas chromatography / mass spectrometry (GC/MS) is the preferred confirmat ory method. Performed at [facility], [address] [place], [place] [address]   Acetaminophen level     Status: Abnormal   Collection Time: 08/22/18 11:13 PM  Result Value Ref Range   Acetaminophen  (Tylenol), Serum <10 (L) 10 - 30 ug/mL    Comment: (NOTE) Therapeutic concentrations vary significantly. A range of 10-30 ug/mL  may be an effective concentration for many patients. However, some  are best treated at concentrations outside of this range. Acetaminophen concentrations >150 ug/mL at 4 hours after ingestion  and >50 ug/mL at 12 hours after ingestion are often associated with  toxic reactions. Performed at [facility], [address] [place], [place] [address]   CBC with Differential     Status: Abnormal   Collection Time: 08/23/18 11:41 AM  Result Value Ref Range   WBC 10.2 4.0 - 10.5 K/uL   RBC 3.68 (L) 4.22 - 5.81 MIL/uL   Hemoglobin 8.0 (L) 13.0 - 17.0 g/dL    Comment: Reticulocyte Hemoglobin testing may be clinically indicated, consider ordering this additional test [id number]    HCT 27.7 (L) 39.0 - 52.0 %   MCV 75.3 (L) 80.0 - 100.0 fL   MCH 21.7 (L) 26.0 - 34.0 pg   MCHC 28.9 (L) 30.0 - 36.0 g/dL   RDW 22.5 (H) 11.5 - 15.5 %   Platelets 298 150 - 400 K/uL   nRBC 0.0 0.0 - 0.2 %   Neutrophils Relative % 64 %   Neutro Abs 6.5 1.7 - 7.7 K/uL   Lymphocytes Relative 12 %   Lymphs Abs 1.2 0.7 - 4.0 K/uL   Monocytes Relative 11 %   Monocytes Absolute 1.2 (H) 0.1 - 1.0 K/uL   Eosinophils Relative 9 %   Eosinophils Absolute 1.0 (H) 0.0 - 0.5 K/uL   Basophils Relative 1 %   Basophils Absolute 0.1 0.0 - 0.1 K/uL   Immature Granulocytes 3 %   Abs Immature Granulocytes 0.29 (H) 0.00 - 0.07 K/uL    Comment: Performed at [facility], [address] [place], [place] [address]  Basic metabolic panel     Status: Abnormal   Collection Time: 08/23/18  5:11 PM  Result Value Ref Range   Sodium 132 (L) 135 - 145 mmol/L   Potassium 4.0 3.5 - 5.1 mmol/L   Chloride 100 98 - 111 mmol/L   CO2 24 22 - 32 mmol/L   Glucose, Bld 102 (H) 70 - 99 mg/dL   BUN 19 8 - 23 mg/dL   Creatinine, Ser 0.89 0.61 - 1.24 mg/dL   Calcium 8.3 (L) 8.9 - 10.3 mg/dL   GFR  calc non Af Amer >60 >60 mL/min   GFR calc Af Amer >60 >60 mL/min   Anion gap 8 5 - 15    Comment: Performed at [facility], [address] [place], [place] [address]    Current Facility-Administered Medications  Medication Dose Route Frequency Provider Last Rate Last Dose  . FLUoxetine (PROZAC) capsule 40 mg  40 mg Oral Daily Lavella Hammock, MD   40 mg  at 08/24/18 0908  . prazosin (MINIPRESS) capsule 1 mg  1 mg Oral QHS Lavella Hammock, MD   1 mg at 08/23/18 2104   Current Outpatient Medications  Medication Sig Dispense Refill  . clobetasol cream (TEMOVATE) 1.61 % Apply 1 application topically 2 (two) times daily.    . divalproex (DEPAKOTE ER) 500 MG 24 hr tablet Take 1,000 mg by mouth 2 (two) times daily.     . DULoxetine (CYMBALTA) 30 MG capsule Take 30 mg by mouth daily.     Marland Kitchen ibuprofen (ADVIL,MOTRIN) 800 MG tablet Take 800 mg by mouth every 8 (eight) hours as needed.    Marland Kitchen lisinopril (PRINIVIL,ZESTRIL) 10 MG tablet Take 10 mg by mouth daily.    Marland Kitchen nystatin ointment (MYCOSTATIN) Apply 1 application topically 2 (two) times daily.    . pravastatin (PRAVACHOL) 80 MG tablet Take 80 mg by mouth at bedtime.     Marland Kitchen tolnaftate (TINACTIN) 1 % powder Apply 1 application topically 2 (two) times daily.      Musculoskeletal: Strength & Muscle Tone: decreased Gait & Station: not assessed patient in ED bed Patient leans: N/A  Psychiatric Specialty Exam: Physical Exam  Nursing note and vitals reviewed. Constitutional: He is oriented to person, place, and time. He appears well-developed and well-nourished. No distress.  HENT:  Head: Normocephalic and atraumatic.  Eyes: EOM are normal.  Neck: Normal range of motion.  Cardiovascular: Normal rate and regular rhythm.  Respiratory: Effort normal.  Musculoskeletal: Normal range of motion.  Neurological: He is alert and oriented to person, place, and time.  Psychiatric: His speech is normal and behavior is normal. Judgment and thought  content normal. His mood appears anxious. Cognition and memory are normal. He exhibits a depressed mood.    Review of Systems  Psychiatric/Behavioral: Positive for depression and substance abuse. Negative for hallucinations and memory loss. Suicidal ideas: suicide attempt by overdose. The patient is nervous/anxious and has insomnia.   All other systems reviewed and are negative.   Blood pressure 135/80, pulse 70, temperature 98.7 F (37.1 C), temperature source Oral, resp. rate 16, height 5\' 8"  (1.727 m), weight 70.3 kg, SpO2 99 %.Body mass index is 23.57 kg/m.  General Appearance: Disheveled  Eye Contact:  Minimal  Speech:  Clear and Coherent  Volume:  Normal  Mood:  Anxious and Depressed  Affect:  Congruent  Thought Process:  Goal Directed  Orientation:  Full (Time, Place, and Person)  Thought Content:  Logical and Hallucinations: None  Suicidal Thoughts:  Yes.  with intent/plan  Homicidal Thoughts:  No  Memory:  Immediate;   Poor Recent;   Poor Remote;   Fair  Judgement:  Poor  Insight:  Shallow  Psychomotor Activity:  Decreased and Psychomotor Retardation  Concentration:  Concentration: Poor  Recall:  [employer] of Knowledge:  Fair  Language:  Fair  Akathisia:  No  Handed:  Right  AIMS (if indicated):     Assets:  Desire for Improvement Financial Resources/Insurance  ADL's:  Intact  Cognition:  WNL  Sleep:   poor     Treatment Plan Summary: Major depressive disorder, recurrent, severe without psychosis: -Restarted Prozac 40 mg daily  Alcohol and substance abuse: Patient not exhibiting any withdrawal symptoms.  Last alcoholic intake was 0/96/0454.    Nightmares r/t to PTSD Restarted Prazosin 1 mg at bedtime to prevent nightmares.  Disposition: Recommend psychiatric Inpatient admission when medically cleared.  Transfer to [place], NP 08/24/2018 11:08 AM

## 2018-08-24 NOTE — ED Notes (Signed)
Patient assigned to appropriate care area   Introduced self to pt  Patient oriented to unit/care area: Informed that, for their safety, care areas are designed for safety and visiting and phone hours explained to patient. Patient verbalizes understanding, and verbal contract for safety obtained  Environment secured  

## 2018-08-24 NOTE — ED Notes (Signed)
Hourly rounding reveals patient in room. No complaints, stable, in no acute distress. Q15 minute rounds and monitoring via Rover and Officer to continue.   

## 2018-08-24 NOTE — ED Notes (Signed)
Patient complained of headache 8/10 pain, asked if he could get something for pain. informed him I will speak with the MD

## 2018-08-24 NOTE — ED Notes (Signed)
Patient being transferred to [facility] with [facility], patient and sheriff received transfer papers. Patient received belongings and verbalized he has received all of his belongings. Patient appropriate and cooperative, Denies SI/HI AVH. Vital signs taken. NAD noted.

## 2019-01-09 ENCOUNTER — Other Ambulatory Visit: Payer: Self-pay

## 2019-01-16 ENCOUNTER — Emergency Department
Admission: EM | Admit: 2019-01-16 | Discharge: 2019-01-16 | Disposition: A | Discharge status: Home / Self Care | Payer: No Typology Code available for payment source | Attending: Student in an Organized Health Care Education/Training Program | Admitting: Student in an Organized Health Care Education/Training Program

## 2019-01-16 ENCOUNTER — Emergency Department: Payer: No Typology Code available for payment source

## 2019-01-16 ENCOUNTER — Other Ambulatory Visit: Payer: Self-pay

## 2019-01-16 DIAGNOSIS — G4700 Insomnia, unspecified: Secondary | ICD-10-CM | POA: Insufficient documentation

## 2019-01-16 DIAGNOSIS — Z79899 Other long term (current) drug therapy: Secondary | ICD-10-CM | POA: Insufficient documentation

## 2019-01-16 DIAGNOSIS — R202 Paresthesia of skin: Secondary | ICD-10-CM | POA: Insufficient documentation

## 2019-01-16 LAB — COMPREHENSIVE METABOLIC PANEL
ALT: 25 U/L (ref 0–44)
AST: 29 U/L (ref 15–41)
Albumin: 3.9 g/dL (ref 3.5–5.0)
Alkaline Phosphatase: 78 U/L (ref 38–126)
Anion gap: 9 (ref 5–15)
BUN: 52 mg/dL — ABNORMAL HIGH (ref 8–23)
CO2: 24 mmol/L (ref 22–32)
Calcium: 9.7 mg/dL (ref 8.9–10.3)
Chloride: 109 mmol/L (ref 98–111)
Creatinine, Ser: 1.08 mg/dL (ref 0.61–1.24)
GFR calc Af Amer: >60 mL/min (ref >60)
GFR calc non Af Amer: >60 mL/min (ref >60)
Glucose, Bld: 108 mg/dL — ABNORMAL HIGH (ref 70–99)
Potassium: 4.2 mmol/L (ref 3.5–5.1)
Sodium: 142 mmol/L (ref 135–145)
Total Bilirubin: 0.8 mg/dL (ref 0.3–1.2)
Total Protein: 6.8 g/dL (ref 6.5–8.1)

## 2019-01-16 LAB — CBC WITH DIFFERENTIAL/PLATELET
Abs Immature Granulocytes: 0.06 10*3/uL (ref 0.00–0.07)
Basophils Absolute: 0.1 10*3/uL (ref 0.0–0.1)
Basophils Relative: 1 %
Eosinophils Absolute: 0.5 10*3/uL (ref 0.0–0.5)
Eosinophils Relative: 6 %
HCT: 42.6 % (ref 39.0–52.0)
Hemoglobin: 12.1 g/dL — ABNORMAL LOW (ref 13.0–17.0)
Immature Granulocytes: 1 %
Lymphocytes Relative: 17 %
Lymphs Abs: 1.6 10*3/uL (ref 0.7–4.0)
MCH: 18.9 pg — ABNORMAL LOW (ref 26.0–34.0)
MCHC: 28.4 g/dL — ABNORMAL LOW (ref 30.0–36.0)
MCV: 66.7 fL — ABNORMAL LOW (ref 80.0–100.0)
Monocytes Absolute: 0.9 10*3/uL (ref 0.1–1.0)
Monocytes Relative: 9 %
Neutro Abs: 6.2 10*3/uL (ref 1.7–7.7)
Neutrophils Relative %: 66 %
Platelets: 287 10*3/uL (ref 150–400)
RBC: 6.39 MIL/uL — ABNORMAL HIGH (ref 4.22–5.81)
RDW: 22.3 % — ABNORMAL HIGH (ref 11.5–15.5)
Smear Review: NORMAL
WBC: 9.3 10*3/uL (ref 4.0–10.5)
nRBC: 0 % (ref 0.0–0.2)

## 2019-01-16 LAB — LIPASE, BLOOD: Lipase: 57 U/L — ABNORMAL HIGH (ref 11–51)

## 2019-01-16 LAB — TROPONIN I (HIGH SENSITIVITY)
Troponin I (High Sensitivity): 5 ng/L (ref <18)
Troponin I (High Sensitivity): 5 ng/L (ref <18)

## 2019-01-16 MED ORDER — DIPHENHYDRAMINE HCL 50 MG/ML IJ SOLN
12.5000 mg | Freq: Once | INTRAMUSCULAR | Status: AC
  Administered 2019-01-16: 12.5 mg via INTRAVENOUS
  Filled 2019-01-16: qty 1

## 2019-01-16 MED ORDER — PROCHLORPERAZINE EDISYLATE 10 MG/2ML IJ SOLN: 10.0000 mg | Freq: Once | INTRAMUSCULAR | Status: DC

## 2019-01-16 MED ORDER — METOCLOPRAMIDE HCL 5 MG/ML IJ SOLN
10.0000 mg | Freq: Once | INTRAMUSCULAR | Status: AC
  Administered 2019-01-16: 08:00:00 10 mg via INTRAVENOUS
  Filled 2019-01-16: qty 2

## 2019-01-16 MED ORDER — SODIUM CHLORIDE 0.9 % IV BOLUS
500.0000 mL | Freq: Once | INTRAVENOUS | Status: AC
  Administered 2019-01-16: 08:00:00 500 mL via INTRAVENOUS

## 2019-01-16 MED ORDER — HYDROXYZINE HCL 10 MG PO TABS: 10.0000 mg | ORAL_TABLET | Freq: Three times a day (TID) | ORAL | 0 refills | Status: DC | PRN

## 2019-01-16 NOTE — Progress Notes (Signed)
I was contacted by ED about Omar Davis and his presentation with headache and full body pain with no evidence of recent trauma. His CT shows prior craniotomy on the right side with some hyperdense tissue underneath the bone flap consistent with scar and thickened dura. I do not see clear evidence f acute hemorrhage. No intervention is needed for this as he is at neurologic baseline. Recommend follow up with surgeon that performed prior surgery for evaluation

## 2019-01-16 NOTE — ED Triage Notes (Signed)
Patient states that he has had stinging since surgery for a brain bleed 3 months ago. Patient states that the stinging has "peaked" tonight.
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
## 2019-01-16 NOTE — ED Notes (Signed)
Report given to Ariel.

## 2019-01-16 NOTE — ED Notes (Signed)
EDP at bedside  

## 2019-01-18 ENCOUNTER — Emergency Department
Admission: EM | Admit: 2019-01-18 | Discharge: 2019-01-19 | Disposition: A | Discharge status: Home / Self Care | Payer: No Typology Code available for payment source | Attending: Emergency Medicine | Admitting: Emergency Medicine

## 2019-01-18 ENCOUNTER — Encounter: Payer: Self-pay | Admitting: Emergency Medicine

## 2019-01-18 DIAGNOSIS — Y907 Blood alcohol level of 200-239 mg/100 ml: Secondary | ICD-10-CM | POA: Insufficient documentation

## 2019-01-18 DIAGNOSIS — F10188 Alcohol abuse with other alcohol-induced disorder: Secondary | ICD-10-CM | POA: Diagnosis not present

## 2019-01-18 DIAGNOSIS — F10220 Alcohol dependence with intoxication, uncomplicated: Secondary | ICD-10-CM

## 2019-01-18 DIAGNOSIS — Z0289 Encounter for other administrative examinations: Secondary | ICD-10-CM | POA: Insufficient documentation

## 2019-01-18 DIAGNOSIS — F10920 Alcohol use, unspecified with intoxication, uncomplicated: Secondary | ICD-10-CM

## 2019-01-18 DIAGNOSIS — Z79899 Other long term (current) drug therapy: Secondary | ICD-10-CM | POA: Insufficient documentation

## 2019-01-18 DIAGNOSIS — F329 Major depressive disorder, single episode, unspecified: Secondary | ICD-10-CM | POA: Diagnosis not present

## 2019-01-18 DIAGNOSIS — F1020 Alcohol dependence, uncomplicated: Secondary | ICD-10-CM | POA: Diagnosis present

## 2019-01-18 DIAGNOSIS — R451 Restlessness and agitation: Secondary | ICD-10-CM | POA: Diagnosis not present

## 2019-01-18 LAB — COMPREHENSIVE METABOLIC PANEL
ALT: 31 U/L (ref 0–44)
AST: 39 U/L (ref 15–41)
Albumin: 3.9 g/dL (ref 3.5–5.0)
Alkaline Phosphatase: 81 U/L (ref 38–126)
Anion gap: 13 (ref 5–15)
BUN: 35 mg/dL — ABNORMAL HIGH (ref 8–23)
CO2: 20 mmol/L — ABNORMAL LOW (ref 22–32)
Calcium: 8.6 mg/dL — ABNORMAL LOW (ref 8.9–10.3)
Chloride: 108 mmol/L (ref 98–111)
Creatinine, Ser: 1.32 mg/dL — ABNORMAL HIGH (ref 0.61–1.24)
GFR calc Af Amer: >60 mL/min (ref >60)
GFR calc non Af Amer: 54 mL/min — ABNORMAL LOW (ref >60)
Glucose, Bld: 106 mg/dL — ABNORMAL HIGH (ref 70–99)
Potassium: 3.6 mmol/L (ref 3.5–5.1)
Sodium: 141 mmol/L (ref 135–145)
Total Bilirubin: 0.1 mg/dL — ABNORMAL LOW (ref 0.3–1.2)
Total Protein: 6.6 g/dL (ref 6.5–8.1)

## 2019-01-18 LAB — CBC
HCT: 41.5 % (ref 39.0–52.0)
Hemoglobin: 12 g/dL — ABNORMAL LOW (ref 13.0–17.0)
MCH: 19.3 pg — ABNORMAL LOW (ref 26.0–34.0)
MCHC: 28.9 g/dL — ABNORMAL LOW (ref 30.0–36.0)
MCV: 66.7 fL — ABNORMAL LOW (ref 80.0–100.0)
Platelets: 296 10*3/uL (ref 150–400)
RBC: 6.22 MIL/uL — ABNORMAL HIGH (ref 4.22–5.81)
RDW: 21.9 % — ABNORMAL HIGH (ref 11.5–15.5)
WBC: 11 10*3/uL — ABNORMAL HIGH (ref 4.0–10.5)
nRBC: 0 % (ref 0.0–0.2)

## 2019-01-18 LAB — ETHANOL: Alcohol, Ethyl (B): 219 mg/dL — ABNORMAL HIGH (ref <10)

## 2019-01-18 LAB — ACETAMINOPHEN LEVEL: Acetaminophen (Tylenol), Serum: <10 ug/mL — ABNORMAL LOW (ref 10–30)

## 2019-01-18 LAB — SALICYLATE LEVEL: Salicylate Lvl: <7 mg/dL (ref 2.8–30.0)

## 2019-01-18 MED ORDER — LORAZEPAM 2 MG/ML IJ SOLN
2.0000 mg | Freq: Once | INTRAMUSCULAR | Status: AC
  Administered 2019-01-18: 22:00:00 2 mg via INTRAMUSCULAR

## 2019-01-18 MED ORDER — DIPHENHYDRAMINE HCL 50 MG/ML IJ SOLN
50.0000 mg | Freq: Once | INTRAMUSCULAR | Status: AC
  Administered 2019-01-18: 22:00:00 50 mg via INTRAMUSCULAR

## 2019-01-18 MED ORDER — LORAZEPAM 2 MG/ML IJ SOLN
INTRAMUSCULAR | Status: AC
  Administered 2019-01-18: 2 mg via INTRAMUSCULAR
  Filled 2019-01-18: qty 1

## 2019-01-18 MED ORDER — HALOPERIDOL LACTATE 5 MG/ML IJ SOLN
5.0000 mg | Freq: Once | INTRAMUSCULAR | Status: AC
  Administered 2019-01-18: 22:00:00 5 mg via INTRAVENOUS

## 2019-01-18 MED ORDER — DIPHENHYDRAMINE HCL 50 MG/ML IJ SOLN
INTRAMUSCULAR | Status: AC
  Administered 2019-01-18: 22:00:00 50 mg via INTRAMUSCULAR
  Filled 2019-01-18: qty 1

## 2019-01-18 MED ORDER — HALOPERIDOL LACTATE 5 MG/ML IJ SOLN
INTRAMUSCULAR | Status: AC
  Administered 2019-01-18: 5 mg via INTRAVENOUS
  Filled 2019-01-18: qty 1

## 2019-01-18 NOTE — ED Triage Notes (Signed)
Pt. Combative upon arrival to ED.
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
## 2019-01-19 DIAGNOSIS — F10188 Alcohol abuse with other alcohol-induced disorder: Secondary | ICD-10-CM

## 2019-01-19 DIAGNOSIS — F329 Major depressive disorder, single episode, unspecified: Secondary | ICD-10-CM

## 2019-01-19 DIAGNOSIS — R451 Restlessness and agitation: Secondary | ICD-10-CM

## 2019-01-19 LAB — URINE DRUG SCREEN, QUALITATIVE (ARMC ONLY)
Amphetamines, Ur Screen: NOT DETECTED
Barbiturates, Ur Screen: NOT DETECTED
Benzodiazepine, Ur Scrn: POSITIVE — AB
Cannabinoid 50 Ng, Ur ~~LOC~~: NOT DETECTED
Cocaine Metabolite,Ur ~~LOC~~: NOT DETECTED
MDMA (Ecstasy)Ur Screen: NOT DETECTED
Methadone Scn, Ur: NOT DETECTED
Opiate, Ur Screen: NOT DETECTED
Phencyclidine (PCP) Ur S: NOT DETECTED
Tricyclic, Ur Screen: POSITIVE — AB

## 2019-01-19 MED ORDER — HYDROXYZINE HCL 10 MG PO TABS
10.0000 mg | ORAL_TABLET | Freq: Three times a day (TID) | ORAL | Status: DC | PRN
  Filled 2019-01-19: qty 1

## 2019-01-19 MED ORDER — LISINOPRIL 10 MG PO TABS
10.0000 mg | ORAL_TABLET | Freq: Every day | ORAL | Status: DC
  Administered 2019-01-19: 10 mg via ORAL
  Filled 2019-01-19: qty 1

## 2019-01-19 MED ORDER — DULOXETINE HCL 30 MG PO CPEP
30.0000 mg | ORAL_CAPSULE | Freq: Every day | ORAL | Status: DC
  Administered 2019-01-19: 10:00:00 30 mg via ORAL
  Filled 2019-01-19: qty 1

## 2019-01-19 MED ORDER — PRAVASTATIN SODIUM 40 MG PO TABS
80.0000 mg | ORAL_TABLET | Freq: Every day | ORAL | Status: DC
  Filled 2019-01-19 (×2): qty 2

## 2019-01-19 MED ORDER — DIVALPROEX SODIUM ER 250 MG PO TB24
1000.0000 mg | ORAL_TABLET | Freq: Two times a day (BID) | ORAL | Status: DC
  Administered 2019-01-19: 1000 mg via ORAL
  Filled 2019-01-19: qty 4

## 2019-01-19 NOTE — ED Notes (Signed)
AAOx3.  Skin warm and dry. NAD.  Ambulates with easy and steady gait.  Patient to call taxi for ride home.

## 2019-01-19 NOTE — ED Notes (Signed)
Offered patient to shower.  Patient refused stating "I'll just shower once I get home."
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
## 2019-01-19 NOTE — ED Provider Notes (Signed)
2:19 PM Patient seen and evaluated by psychiatry team and cleared for discharge. Patient determined to not be a threat to himself or others. IVC rescinded by psychiatry team. Patient is otherwise medically clear. Patient is clinically sober - ambulatory with steady gait, clear speech, linear thought process, and has tolerated PO in the ED. Will provide detox resources.  Will plan for discharge with outpatient follow up.     Lilia Pro., MD 01/19/19 [phone]

## 2019-01-19 NOTE — Discharge Instructions (Signed)
[facility] ([facility]) [facility]  Mental health service in [place], [place] Address: [address] [place], [place] [address] Hours:  Closed ? Caesar Bookman Phone: [phone]    Alcohol Abuse and Dependence Information, Adult Alcohol is a widely available drug. People drink alcohol in different amounts. People who drink alcohol very often and in large amounts often have problems during and after drinking. They may develop what is called an alcohol use disorder. There are two main types of alcohol use disorders:  Alcohol abuse. This is when you use alcohol too much or too often. You may use alcohol to make yourself feel happy or to reduce stress. You may have a hard time setting a limit on the amount you drink.  Alcohol dependence. This is when you use alcohol consistently for a period of time, and your body changes as a result. This can make it hard to stop drinking because you may start to feel sick or feel different when you do not use alcohol. These symptoms are known as withdrawal. How can alcohol abuse and dependence affect me? Alcohol abuse and dependence can have a negative effect on your life. Drinking too much can lead to addiction. You may feel like you need alcohol to function normally. You may drink alcohol before work in the morning, during the day, or as soon as you get home from work in the evening. These actions can result in:  Poor work performance.  Job loss.  Financial problems.  Car crashes or criminal charges from driving after drinking alcohol.  Problems in your relationships with friends and family.  Losing the trust and respect of coworkers, friends, and family. Drinking heavily over a long period of time can permanently damage your body and brain, and can cause lifelong health issues, such as:  Damage to your liver or pancreas.  Heart  problems, high blood pressure, or stroke.  Certain cancers.  Decreased ability to fight infections.  Brain or nerve damage.  Depression.  Early (premature) death. If you are careless or you crave alcohol, it is easy to drink more than your body can handle (overdose). Alcohol overdose is a serious situation that requires hospitalization. It may lead to permanent injuries or death. What can increase my risk?  Having a family history of alcohol abuse.  Having depression or other mental health conditions.  Beginning to drink at an early age.  Binge drinking often.  Experiencing trauma, stress, and an unstable home life during childhood.  Spending time with people who drink often. What actions can I take to prevent or manage alcohol abuse and dependence?  Do not drink alcohol if: ? Your health care provider tells you not to drink. ? You are pregnant, may be pregnant, or are planning to become pregnant.  If you drink alcohol: ? Limit how much you use to:  0-1 drink a day for women.  0-2 drinks a day for men. ? Be aware of how much alcohol is in your drink. In the [place] one drink equals one 12 oz bottle of beer (355 mL), one 5 oz glass of wine (148 mL), or one 1 oz glass of hard liquor (44 mL).  Stop drinking if you have been drinking too much. This can be very hard to do if you are used to abusing alcohol. If you begin to have withdrawal symptoms, talk with your health care provider or  a person that you trust. These symptoms may include anxiety, shaky hands, headache, nausea, sweating, or not being able to sleep.  Choose to drink nonalcoholic beverages in social gatherings and places where there may be alcohol. Activity  Spend more time on activities that you enjoy that do not involve alcohol, like hobbies or exercise.  Find healthy ways to cope with stress, such as exercise, meditation, or spending time with people you care about. General information  Talk to your family,  coworkers, and friends about supporting you in your efforts to stop drinking. If they drink, ask them not to drink around you. Spend more time with people who do not drink alcohol.  If you think that you have an alcohol dependency problem: ? Tell friends or family about your concerns. ? Talk with your health care provider or another health professional about where to get help. ? Work with a Transport planner and a Regulatory affairs officer. ? Consider joining a support group for people who struggle with alcohol abuse and dependence. Where to find support   Your health care provider.  SMART Recovery: www.smartrecovery.org Therapy and support groups  Local treatment centers or chemical dependency counselors.  Local AA groups in your community: NicTax.com.pt Where to find more information  Centers for Disease Control and Prevention: http://www.wolf.info/  National Institute on Alcohol Abuse and Alcoholism: http://www.bradshaw.com/  Alcoholics Anonymous (AA): NicTax.com.pt Contact a health care provider if:  You drank more or for longer than you intended on more than one occasion.  You tried to stop drinking or to cut back on how much you drink, but you were not able to.  You often drink to the point of vomiting or passing out.  You want to drink so badly that you cannot think about anything else.  You have problems in your life due to drinking, but you continue to drink.  You keep drinking even though you feel anxious, depressed, or have experienced memory loss.  You have stopped doing the things you used to enjoy in order to drink.  You have to drink more than you used to in order to get the effect you want.  You experience anxiety, sweating, nausea, shakiness, and trouble sleeping when you try to stop drinking. Get help right away if:  You have thoughts about hurting yourself or others.  You have serious withdrawal symptoms, including: ? Confusion. ? Racing heart. ? High blood  pressure. ? Fever. If you ever feel like you may hurt yourself or others, or have thoughts about taking your own life, get help right away. You can go to your nearest emergency department or call:  Your local emergency services (911 in the [place]  A suicide crisis helpline, such as the [place] [facility] at 715-517-2963. This is open 24 hours a day. Summary  Alcohol abuse and dependence can have a negative effect on your life. Drinking too much or too often can lead to addiction.  If you drink alcohol, limit how much you use.  If you are having trouble keeping your drinking under control, find ways to change your behavior. Hobbies, calming activities, exercise, or support groups can help.  If you feel you need help with changing your drinking habits, talk with your health care provider, a good friend, or a therapist, or go to an [place] group. This information is not intended to replace advice given to you by your health care provider. Make sure you discuss any questions you have with your health care provider. Document  Released: 05/03/2016 Document Revised: 08/28/2018 Document Reviewed: 07/17/2018 Elsevier Patient Education  2020 [employer].

## 2019-01-19 NOTE — ED Provider Notes (Signed)
-----------------------------------------   6:37 AM on 01/19/2019 -----------------------------------------   Blood pressure 126/64, pulse (!) 117, temperature 98.2 F (36.8 C), temperature source Axillary, resp. rate 20, height 5\' 3"  (1.6 m), weight 77.1 kg, SpO2 90 %.  The patient is sleeping at this time.  There have been no acute events since the last update.  Awaiting disposition plan from Behavioral Medicine.   Paulette Blanch, MD 01/19/19 [phone]
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
## 2019-01-19 NOTE — ED Notes (Signed)
Pt belongings include: 1 pr black tennis shoes, 1 pr black/orange socks, 1 purple t-shirt, 1 pr black sweat pants, 1 pr blue boxers. No cash, wallet, jewelry, cell phone, or other valuables brought with pt to ED. Pt's belongings bagged and labeled per policy.

## 2019-01-23 ENCOUNTER — Emergency Department
Admission: EM | Admit: 2019-01-23 | Discharge: 2019-01-24 | Disposition: A | Discharge status: Home / Self Care | Payer: No Typology Code available for payment source | Attending: Emergency Medicine | Admitting: Emergency Medicine

## 2019-01-23 ENCOUNTER — Encounter: Payer: Self-pay | Admitting: Emergency Medicine

## 2019-01-23 ENCOUNTER — Other Ambulatory Visit: Payer: Self-pay

## 2019-01-23 DIAGNOSIS — F1010 Alcohol abuse, uncomplicated: Secondary | ICD-10-CM | POA: Insufficient documentation

## 2019-01-23 DIAGNOSIS — F1020 Alcohol dependence, uncomplicated: Secondary | ICD-10-CM | POA: Diagnosis present

## 2019-01-23 DIAGNOSIS — F329 Major depressive disorder, single episode, unspecified: Secondary | ICD-10-CM | POA: Diagnosis not present

## 2019-01-23 DIAGNOSIS — F1094 Alcohol use, unspecified with alcohol-induced mood disorder: Secondary | ICD-10-CM | POA: Diagnosis present

## 2019-01-23 DIAGNOSIS — R451 Restlessness and agitation: Secondary | ICD-10-CM | POA: Diagnosis present

## 2019-01-23 DIAGNOSIS — G8929 Other chronic pain: Secondary | ICD-10-CM | POA: Diagnosis present

## 2019-01-23 DIAGNOSIS — R45851 Suicidal ideations: Secondary | ICD-10-CM | POA: Insufficient documentation

## 2019-01-23 DIAGNOSIS — F10220 Alcohol dependence with intoxication, uncomplicated: Secondary | ICD-10-CM | POA: Diagnosis present

## 2019-01-23 DIAGNOSIS — F4310 Post-traumatic stress disorder, unspecified: Secondary | ICD-10-CM | POA: Diagnosis present

## 2019-01-23 DIAGNOSIS — Z79899 Other long term (current) drug therapy: Secondary | ICD-10-CM | POA: Insufficient documentation

## 2019-01-23 DIAGNOSIS — F32A Depression, unspecified: Secondary | ICD-10-CM | POA: Diagnosis present

## 2019-01-23 DIAGNOSIS — Z046 Encounter for general psychiatric examination, requested by authority: Secondary | ICD-10-CM | POA: Diagnosis present

## 2019-01-23 DIAGNOSIS — I10 Essential (primary) hypertension: Secondary | ICD-10-CM | POA: Insufficient documentation

## 2019-01-23 HISTORY — DX: Essential (primary) hypertension: I10

## 2019-01-23 HISTORY — DX: Unspecified intracranial injury with loss of consciousness status unknown, initial encounter: S06.9XAA

## 2019-01-23 HISTORY — DX: Post-traumatic stress disorder, unspecified: F43.10

## 2019-01-23 HISTORY — DX: Unspecified intracranial injury with loss of consciousness of unspecified duration, initial encounter: S06.9X9A

## 2019-01-23 LAB — CBC
HCT: 43 % (ref 39.0–52.0)
Hemoglobin: 12.3 g/dL — ABNORMAL LOW (ref 13.0–17.0)
MCH: 19.1 pg — ABNORMAL LOW (ref 26.0–34.0)
MCHC: 28.6 g/dL — ABNORMAL LOW (ref 30.0–36.0)
MCV: 66.7 fL — ABNORMAL LOW (ref 80.0–100.0)
Platelets: 306 10*3/uL (ref 150–400)
RBC: 6.45 MIL/uL — ABNORMAL HIGH (ref 4.22–5.81)
RDW: 22.5 % — ABNORMAL HIGH (ref 11.5–15.5)
WBC: 9.7 10*3/uL (ref 4.0–10.5)
nRBC: 0 % (ref 0.0–0.2)

## 2019-01-23 LAB — COMPREHENSIVE METABOLIC PANEL
ALT: 36 U/L (ref 0–44)
AST: 37 U/L (ref 15–41)
Albumin: 4.1 g/dL (ref 3.5–5.0)
Alkaline Phosphatase: 89 U/L (ref 38–126)
Anion gap: 12 (ref 5–15)
BUN: 34 mg/dL — ABNORMAL HIGH (ref 8–23)
CO2: 22 mmol/L (ref 22–32)
Calcium: 9.6 mg/dL (ref 8.9–10.3)
Chloride: 108 mmol/L (ref 98–111)
Creatinine, Ser: 1.43 mg/dL — ABNORMAL HIGH (ref 0.61–1.24)
GFR calc Af Amer: 57 mL/min — ABNORMAL LOW (ref >60)
GFR calc non Af Amer: 49 mL/min — ABNORMAL LOW (ref >60)
Glucose, Bld: 102 mg/dL — ABNORMAL HIGH (ref 70–99)
Potassium: 3.9 mmol/L (ref 3.5–5.1)
Sodium: 142 mmol/L (ref 135–145)
Total Bilirubin: 0.6 mg/dL (ref 0.3–1.2)
Total Protein: 7.2 g/dL (ref 6.5–8.1)

## 2019-01-23 LAB — URINE DRUG SCREEN, QUALITATIVE (ARMC ONLY)
Amphetamines, Ur Screen: NOT DETECTED
Barbiturates, Ur Screen: NOT DETECTED
Benzodiazepine, Ur Scrn: NOT DETECTED
Cannabinoid 50 Ng, Ur ~~LOC~~: NOT DETECTED
Cocaine Metabolite,Ur ~~LOC~~: NOT DETECTED
MDMA (Ecstasy)Ur Screen: NOT DETECTED
Methadone Scn, Ur: NOT DETECTED
Opiate, Ur Screen: NOT DETECTED
Phencyclidine (PCP) Ur S: NOT DETECTED
Tricyclic, Ur Screen: NOT DETECTED

## 2019-01-23 LAB — SALICYLATE LEVEL: Salicylate Lvl: <7 mg/dL (ref 2.8–30.0)

## 2019-01-23 LAB — ETHANOL: Alcohol, Ethyl (B): 182 mg/dL — ABNORMAL HIGH (ref <10)

## 2019-01-23 LAB — ACETAMINOPHEN LEVEL: Acetaminophen (Tylenol), Serum: <10 ug/mL — ABNORMAL LOW (ref 10–30)

## 2019-01-23 LAB — GLUCOSE, CAPILLARY: Glucose-Capillary: 114 mg/dL — ABNORMAL HIGH (ref 70–99)

## 2019-01-23 MED ORDER — DIPHENHYDRAMINE HCL 50 MG/ML IJ SOLN
50.0000 mg | Freq: Once | INTRAMUSCULAR | Status: DC
  Filled 2019-01-23: qty 1

## 2019-01-23 MED ORDER — LORAZEPAM 2 MG/ML IJ SOLN
2.0000 mg | Freq: Once | INTRAMUSCULAR | Status: AC
  Administered 2019-01-23: 20:00:00 2 mg via INTRAMUSCULAR
  Filled 2019-01-23: qty 1

## 2019-01-23 MED ORDER — DIPHENHYDRAMINE HCL 50 MG/ML IJ SOLN
50.0000 mg | Freq: Once | INTRAMUSCULAR | Status: AC
  Administered 2019-01-23: 50 mg via INTRAMUSCULAR

## 2019-01-23 MED ORDER — HALOPERIDOL LACTATE 5 MG/ML IJ SOLN
5.0000 mg | Freq: Once | INTRAMUSCULAR | Status: AC
  Administered 2019-01-23: 5 mg via INTRAMUSCULAR
  Filled 2019-01-23: qty 1

## 2019-01-23 NOTE — ED Notes (Signed)
Pt comes from triage with [employer] in handcuffs IVC. Pt being violent, yelling, cursing at staff. Pt yelling "just let me fucking die" over and over. Pt given medications. Pt dressed out and belongings placed at nurses station.

## 2019-01-23 NOTE — ED Notes (Signed)
Pt arrived with a green t shirt, red and black pants, grey socks, tennis shoes and boxers.
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
## 2019-01-23 NOTE — ED Provider Notes (Signed)
[facility] Emergency Department Provider Note   ____________________________________________   I have reviewed the triage vital signs and the nursing notes.   HISTORY  Chief Complaint Psychiatric Evaluation   History limited by: Poor historian   HPI Omar Davis is a 68 y.o. male who presents to the emergency department today under IVC because of concern for SI. Patient himself is very agitated. Yelling and crying. Could not get a good history.     Records reviewed. Per medical record review patient has a history of recent ER visit for similar presentation.   Past Medical History:  Diagnosis Date  . Chronic pain   . Depressed   . Hypertension   . PTSD (post-traumatic stress disorder)   . TBI (traumatic brain injury) [facility])     Patient Active Problem List   Diagnosis Date Noted  . Alcohol dependence with intoxication, uncomplicated ([place]) [id number]  . Suicidal ideation 09/25/2015  . Alcohol abuse 09/09/2015  . PTSD (post-traumatic stress disorder) 09/09/2015  . Agitation 09/09/2015  . Depressed   . Chronic pain     Past Surgical History:  Procedure Laterality Date  . BACK SURGERY    . HERNIA REPAIR    . KNEE SURGERY    . KNEE SURGERY    . prostectomy N/A     Prior to Admission medications   Medication Sig Start Date End Date Taking? Authorizing Provider  clobetasol cream (TEMOVATE) [id number] % Apply 1 application topically 2 (two) times daily.    [provider]  divalproex (DEPAKOTE ER) 500 MG 24 hr tablet Take 1,000 mg by mouth 2 (two) times daily.     [provider]  DULoxetine (CYMBALTA) 30 MG capsule Take 30 mg by mouth daily.     [provider]  hydrOXYzine (ATARAX/VISTARIL) 10 MG tablet Take 1 tablet (10 mg total) by mouth 3 (three) times daily as needed. 01/16/19   Merlyn Lot, MD  ibuprofen (ADVIL,MOTRIN) 800 MG tablet Take 800 mg by mouth every 8 (eight) hours as needed.    [provider]  lisinopril (PRINIVIL,ZESTRIL) 10 MG tablet Take 10 mg by mouth daily.    [provider]  nystatin ointment (MYCOSTATIN) Apply 1 application topically 2 (two) times daily.    [provider]  pravastatin (PRAVACHOL) 80 MG tablet Take 80 mg by mouth at bedtime.     [provider]  tolnaftate (TINACTIN) 1 % powder Apply 1 application topically 2 (two) times daily.    [provider]    Allergies Codeine, Gabapentin, and Penicillins  No family history on file.  Social History Social History   Tobacco Use  . Smoking status: Never Smoker  . Smokeless tobacco: Never Used  Substance Use Topics  . Alcohol use: Yes  . Drug use: No    Review of Systems Unable to obtain ROS secondary to patient's uncooperativeness. ____________________________________________   PHYSICAL EXAM:  VITAL SIGNS: ED Triage Vitals [01/23/19 1955]  Enc Vitals Group     BP (!) 148/97     Pulse Rate (!) 114     Resp (!) 22     Temp 98.6 F (37 C)     Temp Source Oral     SpO2 98 %     Weight      Height      Head Circumference      Peak Flow      Pain Score 0    Constitutional: Awake, yelling, agitated. Eyes: Conjunctivae  are normal.  ENT      Head: Normocephalic and atraumatic.      Nose: No congestion/rhinnorhea.      Mouth/Throat: Mucous membranes are moist.      Neck: No stridor. Hematological/Lymphatic/Immunilogical: No cervical lymphadenopathy. Cardiovascular: Normal rate, regular rhythm.  No murmurs, rubs, or gallops.  Respiratory: Normal respiratory effort without tachypnea nor retractions. Breath sounds are clear and equal bilaterally. No wheezes/rales/rhonchi. Gastrointestinal: Soft and non tender. No rebound. No guarding.  Genitourinary: Deferred Musculoskeletal: Normal range of motion in all extremities. No lower extremity edema. Neurologic:  Normal speech and language. No gross focal neurologic deficits are appreciated.  Skin:   Skin is warm, dry and intact. No rash noted. Psychiatric: Yelling, upset.   ____________________________________________    LABS (pertinent positives/negatives)  Acetaminophen, salicylate below threshold Ethanol 182 CBC wbc 9.7, hgb 12.3, plt 306 UDS negative CMP na 142, k 3.9, glu 102, cr 1.43  ____________________________________________   EKG  None  ____________________________________________    RADIOLOGY  None  ____________________________________________   PROCEDURES  Procedures  ____________________________________________   INITIAL IMPRESSION / ASSESSMENT AND PLAN / ED COURSE  Pertinent labs & imaging results that were available during my care of the patient were reviewed by me and considered in my medical decision making (see chart for details).   Patient presented to the emergency department today under IVC.  Patient was quite agitated and yelling upon initial presentation to the emergency department.  Per chart review had a similar presentation recently.  Given concern for agitation and patient's own safety as well as safety staff he was given medications to help calm him down.  These did seem to help calm the patient.  He however was still unable to give a good history.  Will continue IVC and plan to have patient be seen by psychiatry.  ____________________________________________   FINAL CLINICAL IMPRESSION(S) / ED DIAGNOSES  Agitated behavior  Note: This dictation was prepared with Dragon dictation. Any transcriptional errors that result from this process are unintentional     Nance Pear, MD 01/24/19 0004

## 2019-01-24 DIAGNOSIS — F1094 Alcohol use, unspecified with alcohol-induced mood disorder: Secondary | ICD-10-CM | POA: Diagnosis present

## 2019-01-24 MED ORDER — DIVALPROEX SODIUM 500 MG PO DR TAB
1000.0000 mg | DELAYED_RELEASE_TABLET | Freq: Two times a day (BID) | ORAL | Status: DC
  Administered 2019-01-24: 10:00:00 1000 mg via ORAL
  Filled 2019-01-24: qty 2

## 2019-01-24 MED ORDER — HYDROXYZINE HCL 10 MG PO TABS
10.0000 mg | ORAL_TABLET | Freq: Three times a day (TID) | ORAL | Status: DC | PRN
  Filled 2019-01-24: qty 1

## 2019-01-24 MED ORDER — DULOXETINE HCL 30 MG PO CPEP
30.0000 mg | ORAL_CAPSULE | Freq: Every day | ORAL | Status: DC
  Filled 2019-01-24: qty 1

## 2019-01-24 NOTE — ED Notes (Signed)
Patient laying in bed with eyes closed. Respirations even and un-labored. Will continue to monitor.

## 2019-01-24 NOTE — ED Provider Notes (Signed)
-----------------------------------------   7:03 AM on 01/24/2019 -----------------------------------------   Blood pressure (!) 148/97, pulse (!) 114, temperature 98.6 F (37 C), temperature source Oral, resp. rate (!) 22, SpO2 98 %.  The patient is calm and cooperative at this time.  There have been no acute events since the last update.  Awaiting disposition plan from Behavioral Medicine team.   Blake Divine, MD 01/24/19 [phone]

## 2019-01-24 NOTE — Consult Note (Signed)
[facility] Consult   Reason for Consult: Psychiatric Evaluation  Referring Physician:  Dr. Archie Balboa Patient Identification: Omar Davis MRN:  [id number] Principal Diagnosis: <principal problem not specified> Diagnosis:  Active Problems:   Depressed   Chronic pain   Alcohol abuse   PTSD (post-traumatic stress disorder)   Agitation   Suicidal ideation   Alcohol dependence with intoxication, uncomplicated ([place])   Total Time spent with patient: 45 minutes  Subjective: "I am okay.  I have a headache." Omar Davis is a 68 y.o. male patient arrived via law enforcement from a retirement community under involuntary commitment status (IVC). Per ED triage nursing note, the initial patient assessment, he voiced suicidal thoughts with ETOH level being 182 mg/dl.  He presented with some aggression towards staff. The patient screaming profanities at staff and the police officer. The patient was uncorporative and placed in handcuffs.  He was given Benadryl 50 mg IM, Haldol 5 mg IM, and Lorazepam 2 mg IM due to his aggressive behaviors. The patient was seen face-to-face by this provider; chart reviewed and consulted with Dr. Archie Balboa on 01/23/2019 due to the patient's care. It was discussed with EDP that the patient does not meet criteria to be admitted to the psychiatric inpatient unit.   Due to his ED visit being more alcohol use disorder than psychiatrically.  The patient was recently seen in the ED on January 19, 2019.  During that visit, he minimized his drinking, refused detox and rehab.  On this visit he is presenting with the same disposition by stating, "I do not have a problem."  "People make it seems as though I drink a lot, but I do not." The patient is alert and oriented x 1-2, calm, cooperative, and mood-congruent with an affect on evaluation.  The patient does not appear to be responding to internal or external stimuli. Neither is the patient presenting with any  delusional thinking. The patient denies auditory or visual hallucinations. The patient denies any suicidal, homicidal, or self-harm ideations. The patient is not presenting with any psychotic or paranoid behaviors. During an encounter with the patient, he was able to answer some questions appropriately.  Plan: The patient is not a safety risk to self or others and does not require psychiatric inpatient admission for stabilization and treatment.  HPI: Per Dr. Archie Balboa: Omar Davis is a 68 y.o. male who presents to the emergency department today under IVC because of concern for SI. Patient himself is very agitated. Yelling and crying. Could not get a good history.   Records reviewed. Per medical record review patient has a history of recent ER visit for similar presentation.  Past Psychiatric History: Depressed PTSD (posttraumatic stress disorder) TBI (traumatic brain injury) ([place]) Chronic pain  Risk to Self:  No Risk to Others:  No Prior Inpatient Therapy:  Unsure Prior Outpatient Therapy:  Unsure  Past Medical History:  Past Medical History:  Diagnosis Date  . Chronic pain   . Depressed   . Hypertension   . PTSD (post-traumatic stress disorder)   . TBI (traumatic brain injury) [facility])     Past Surgical History:  Procedure Laterality Date  . BACK SURGERY    . HERNIA REPAIR    . KNEE SURGERY    . KNEE SURGERY    . prostectomy N/A    Family History: No family history on file. Family Psychiatric  History:  Social History:  Social History   Substance and Sexual Activity  Alcohol Use Yes  Social History   Substance and Sexual Activity  Drug Use No    Social History   Socioeconomic History  . Marital status: Legally Separated    Spouse name: Not on file  . Number of children: Not on file  . Years of education: Not on file  . Highest education level: Not on file  Occupational History  . Not on file  Social Needs  . Financial resource strain: Not on file   . Food insecurity    Worry: Not on file    Inability: Not on file  . Transportation needs    Medical: Not on file    Non-medical: Not on file  Tobacco Use  . Smoking status: Never Smoker  . Smokeless tobacco: Never Used  Substance and Sexual Activity  . Alcohol use: Yes  . Drug use: No  . Sexual activity: Not Currently  Lifestyle  . Physical activity    Days per week: Not on file    Minutes per session: Not on file  . Stress: Not on file  Relationships  . Social Herbalist on phone: Not on file    Gets together: Not on file    Attends religious service: Not on file    Active member of club or organization: Not on file    Attends meetings of clubs or organizations: Not on file    Relationship status: Not on file  Other Topics Concern  . Not on file  Social History Narrative  . Not on file   Additional Social History:    Allergies:   Allergies  Allergen Reactions  . Codeine Hives  . Gabapentin Other (See Comments)    seizures  . Penicillins Rash    Labs:  Results for orders placed or performed during the hospital encounter of 01/23/19 (from the past 48 hour(s))  Glucose, capillary     Status: Abnormal   Collection Time: 01/23/19  8:01 PM  Result Value Ref Range   Glucose-Capillary 114 (H) 70 - 99 mg/dL  Comprehensive metabolic panel     Status: Abnormal   Collection Time: 01/23/19  8:23 PM  Result Value Ref Range   Sodium 142 135 - 145 mmol/L   Potassium 3.9 3.5 - 5.1 mmol/L   Chloride 108 98 - 111 mmol/L   CO2 22 22 - 32 mmol/L   Glucose, Bld 102 (H) 70 - 99 mg/dL   BUN 34 (H) 8 - 23 mg/dL   Creatinine, Ser 1.43 (H) 0.61 - 1.24 mg/dL   Calcium 9.6 8.9 - 10.3 mg/dL   Total Protein 7.2 6.5 - 8.1 g/dL   Albumin 4.1 3.5 - 5.0 g/dL   AST 37 15 - 41 U/L   ALT 36 0 - 44 U/L   Alkaline Phosphatase 89 38 - 126 U/L   Total Bilirubin 0.6 0.3 - 1.2 mg/dL   GFR calc non Af Amer 49 (L) >60 mL/min   GFR calc Af Amer 57 (L) >60 mL/min   Anion gap 12 5 -  15    Comment: Performed at [facility], [address] [place], [place] [address]  Ethanol     Status: Abnormal   Collection Time: 01/23/19  8:23 PM  Result Value Ref Range   Alcohol, Ethyl (B) 182 (H) <10 mg/dL    Comment: (NOTE) Lowest detectable limit for serum alcohol is 10 mg/dL. For medical purposes only. Performed at [facility], [address], [place],  [address]  Salicylate level     Status: None   Collection Time: 01/23/19  8:23 PM  Result Value Ref Range   Salicylate Lvl [id number] 2.8 - 30.0 mg/dL    Comment: Performed at [facility], [address] [place], [place] [address]  Acetaminophen level     Status: Abnormal   Collection Time: 01/23/19  8:23 PM  Result Value Ref Range   Acetaminophen (Tylenol), Serum <10 (L) 10 - 30 ug/mL    Comment: (NOTE) Therapeutic concentrations vary significantly. A range of 10-30 ug/mL  may be an effective concentration for many patients. However, some  are best treated at concentrations outside of this range. Acetaminophen concentrations >150 ug/mL at 4 hours after ingestion  and >50 ug/mL at 12 hours after ingestion are often associated with  toxic reactions. Performed at [facility], [address] [place], [place] [address]   cbc     Status: Abnormal   Collection Time: 01/23/19  8:23 PM  Result Value Ref Range   WBC 9.7 4.0 - 10.5 K/uL   RBC 6.45 (H) 4.22 - 5.81 MIL/uL   Hemoglobin 12.3 (L) 13.0 - 17.0 g/dL   HCT 43.0 39.0 - 52.0 %   MCV 66.7 (L) 80.0 - 100.0 fL    Comment: REPEATED TO VERIFY   MCH 19.1 (L) 26.0 - 34.0 pg   MCHC 28.6 (L) 30.0 - 36.0 g/dL   RDW 22.5 (H) 11.5 - 15.5 %   Platelets 306 150 - 400 K/uL   nRBC 0.0 0.0 - 0.2 %    Comment: Performed at [facility], [address], [place], [place] [address]  Urine Drug Screen, Qualitative     Status: None   Collection Time: 01/23/19  8:23 PM  Result Value Ref Range   Tricyclic, Ur Screen NONE DETECTED  NONE DETECTED   Amphetamines, Ur Screen NONE DETECTED NONE DETECTED   MDMA (Ecstasy)Ur Screen NONE DETECTED NONE DETECTED   Cocaine Metabolite,Ur [place] NONE DETECTED NONE DETECTED   Opiate, Ur Screen NONE DETECTED NONE DETECTED   Phencyclidine (PCP) Ur S NONE DETECTED NONE DETECTED   Cannabinoid 50 Ng, Ur [place] NONE DETECTED NONE DETECTED   Barbiturates, Ur Screen NONE DETECTED NONE DETECTED   Benzodiazepine, Ur Scrn NONE DETECTED NONE DETECTED   Methadone Scn, Ur NONE DETECTED NONE DETECTED    Comment: (NOTE) Tricyclics + metabolites, urine    Cutoff 1000 ng/mL Amphetamines + metabolites, urine  Cutoff 1000 ng/mL MDMA (Ecstasy), urine              Cutoff 500 ng/mL Cocaine Metabolite, urine          Cutoff 300 ng/mL Opiate + metabolites, urine        Cutoff 300 ng/mL Phencyclidine (PCP), urine         Cutoff 25 ng/mL Cannabinoid, urine                 Cutoff 50 ng/mL Barbiturates + metabolites, urine  Cutoff 200 ng/mL Benzodiazepine, urine              Cutoff 200 ng/mL Methadone, urine                   Cutoff 300 ng/mL The urine drug screen provides only a preliminary, unconfirmed analytical test result and should not be used for non-medical purposes. Clinical consideration and professional judgment should be applied to any positive drug screen result due to possible interfering substances. A more specific alternate chemical method  must be used in order to obtain a confirmed analytical result. Gas chromatography / mass spectrometry (GC/MS) is the preferred confirmat ory method. Performed at [facility], [address] [place], [place] [address]     No current facility-administered medications for this encounter.    Current Outpatient Medications  Medication Sig Dispense Refill  . clobetasol cream (TEMOVATE) [id number] % Apply 1 application topically 2 (two) times daily.    . divalproex (DEPAKOTE ER) 500 MG 24 hr tablet Take 1,000 mg by mouth 2 (two) times daily.     .  DULoxetine (CYMBALTA) 30 MG capsule Take 30 mg by mouth daily.     . hydrOXYzine (ATARAX/VISTARIL) 10 MG tablet Take 1 tablet (10 mg total) by mouth 3 (three) times daily as needed. 12 tablet 0  . ibuprofen (ADVIL,MOTRIN) 800 MG tablet Take 800 mg by mouth every 8 (eight) hours as needed.    Marland Kitchen lisinopril (PRINIVIL,ZESTRIL) 10 MG tablet Take 10 mg by mouth daily.    Marland Kitchen nystatin ointment (MYCOSTATIN) Apply 1 application topically 2 (two) times daily.    . pravastatin (PRAVACHOL) 80 MG tablet Take 80 mg by mouth at bedtime.     Marland Kitchen tolnaftate (TINACTIN) 1 % powder Apply 1 application topically 2 (two) times daily.      Musculoskeletal: Strength & Muscle Tone: within normal limits Gait & Station: normal Patient leans: N/A  Psychiatric Specialty Exam: Physical Exam  Nursing note and vitals reviewed. Constitutional: He is oriented to person, place, and time. He appears well-developed.  HENT:  Head: Normocephalic.  Eyes: Pupils are equal, round, and reactive to light.  Neck: Normal range of motion. Neck supple.  Cardiovascular: Normal rate.  Respiratory: Effort normal.  Musculoskeletal: Normal range of motion.  Neurological: He is alert and oriented to person, place, and time.  Skin: Skin is warm and dry.  Psychiatric: Thought content normal.    Review of Systems  Psychiatric/Behavioral: Positive for substance abuse. The patient is nervous/anxious.   All other systems reviewed and are negative.   Blood pressure (!) 148/97, pulse (!) 114, temperature 98.6 F (37 C), temperature source Oral, resp. rate (!) 22, SpO2 98 %.There is no height or weight on file to calculate BMI.  General Appearance: Bizarre and Disheveled  Eye Contact:  Minimal  Speech:  Garbled and Slow  Volume:  Decreased  Mood:  Anxious and Irritable  Affect:  Congruent and Inappropriate  Thought Process:  Goal Directed  Orientation:  Full (Time, Place, and Person)  Thought Content:  WDL and Logical  Suicidal Thoughts:   No  Homicidal Thoughts:  No  Memory:  Immediate;   Poor Recent;   Poor Remote;   Poor  Judgement:  Poor  Insight:  Lacking  Psychomotor Activity:  Decreased  Concentration:  Concentration: Poor and Attention Span: Poor  Recall:  Poor  Fund of Knowledge:  Poor  Language:  Poor  Akathisia:  Negative  Handed:  Right  AIMS (if indicated):     Assets:  Physical Health Social Support  ADL's:  Intact  Cognition:  WNL  Sleep:        Treatment Plan Summary: Medication management and Plan Patient does not meet criteria for admission to the psychiatric inpatient unit.  The patient could benefit from inpatient substance abuse treatment but is currently refusing.  Depression: -Continue Cymbalta 30 mg daily  Anxiety: -Continue hydroxyzine 10 mg TID PRN  Seizures: -Continue Depakote 1000 mg BID  Disposition: No evidence of imminent risk  to self or others at present.   Patient does not meet criteria for psychiatric inpatient admission. Supportive therapy provided about ongoing stressors.  Caroline Sauger, NP 01/24/2019 2:12 AM
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
## 2019-01-24 NOTE — ED Notes (Signed)
Pt given meal tray, but stated he did not want it. Pt's meal tray left in room.

## 2019-01-24 NOTE — ED Notes (Signed)
Dad at bedside

## 2019-01-26 ENCOUNTER — Emergency Department
Admission: EM | Admit: 2019-01-26 | Discharge: 2019-01-26 | Disposition: A | Discharge status: Home / Self Care | Payer: No Typology Code available for payment source | Attending: Emergency Medicine | Admitting: Emergency Medicine

## 2019-01-26 ENCOUNTER — Other Ambulatory Visit: Payer: Self-pay

## 2019-01-26 ENCOUNTER — Encounter: Payer: Self-pay | Admitting: Emergency Medicine

## 2019-01-26 DIAGNOSIS — Z5321 Procedure and treatment not carried out due to patient leaving prior to being seen by health care provider: Secondary | ICD-10-CM | POA: Diagnosis not present

## 2019-01-26 DIAGNOSIS — R109 Unspecified abdominal pain: Secondary | ICD-10-CM | POA: Diagnosis not present

## 2019-01-26 LAB — CBC
HCT: 39.8 % (ref 39.0–52.0)
Hemoglobin: 11.5 g/dL — ABNORMAL LOW (ref 13.0–17.0)
MCH: 19.3 pg — ABNORMAL LOW (ref 26.0–34.0)
MCHC: 28.9 g/dL — ABNORMAL LOW (ref 30.0–36.0)
MCV: 66.8 fL — ABNORMAL LOW (ref 80.0–100.0)
Platelets: 287 10*3/uL (ref 150–400)
RBC: 5.96 MIL/uL — ABNORMAL HIGH (ref 4.22–5.81)
RDW: 22.4 % — ABNORMAL HIGH (ref 11.5–15.5)
WBC: 7.2 10*3/uL (ref 4.0–10.5)
nRBC: 0 % (ref 0.0–0.2)

## 2019-01-26 LAB — TYPE AND SCREEN
ABO/RH(D): O POS
Antibody Screen: NEGATIVE

## 2019-01-26 LAB — COMPREHENSIVE METABOLIC PANEL
ALT: 34 U/L (ref 0–44)
AST: 41 U/L (ref 15–41)
Albumin: 3.9 g/dL (ref 3.5–5.0)
Alkaline Phosphatase: 92 U/L (ref 38–126)
Anion gap: 9 (ref 5–15)
BUN: 29 mg/dL — ABNORMAL HIGH (ref 8–23)
CO2: 21 mmol/L — ABNORMAL LOW (ref 22–32)
Calcium: 9 mg/dL (ref 8.9–10.3)
Chloride: 112 mmol/L — ABNORMAL HIGH (ref 98–111)
Creatinine, Ser: 1.14 mg/dL (ref 0.61–1.24)
GFR calc Af Amer: >60 mL/min (ref >60)
GFR calc non Af Amer: >60 mL/min (ref >60)
Glucose, Bld: 133 mg/dL — ABNORMAL HIGH (ref 70–99)
Potassium: 3.9 mmol/L (ref 3.5–5.1)
Sodium: 142 mmol/L (ref 135–145)
Total Bilirubin: 0.3 mg/dL (ref 0.3–1.2)
Total Protein: 6.9 g/dL (ref 6.5–8.1)

## 2019-01-26 LAB — PROTIME-INR
INR: 1.1 (ref 0.8–1.2)
Prothrombin Time: 13.6 seconds (ref 11.4–15.2)

## 2019-01-26 NOTE — ED Notes (Signed)
Repeat call, not in waiting room.

## 2019-01-26 NOTE — ED Triage Notes (Signed)
Pt to ED via POV c/o abdominal pain. Pt states that he was recently diagnosed by the [facility] with a bleeding ulcer. Pt is verbally aggressive in triage. Pt is upset because he does not feel like the [facility] is [facility]. Pt stating "[facility]uck the [facility], they don't give a damn about [place]". Pt reminded that this language is not appropriate and encouraged to calm down.

## 2019-01-26 NOTE — ED Notes (Signed)
Called for recheck, not in waiting room.

## 2019-01-26 NOTE — ED Notes (Signed)
Repeat call for recheck. Not in waiting room.

## 2019-01-29 ENCOUNTER — Other Ambulatory Visit: Payer: Self-pay

## 2019-01-29 ENCOUNTER — Emergency Department
Admission: EM | Admit: 2019-01-29 | Discharge: 2019-01-30 | Disposition: A | Discharge status: Home / Self Care | Payer: No Typology Code available for payment source | Attending: Emergency Medicine | Admitting: Emergency Medicine

## 2019-01-29 DIAGNOSIS — Y906 Blood alcohol level of 120-199 mg/100 ml: Secondary | ICD-10-CM | POA: Diagnosis not present

## 2019-01-29 DIAGNOSIS — Z79899 Other long term (current) drug therapy: Secondary | ICD-10-CM | POA: Diagnosis not present

## 2019-01-29 DIAGNOSIS — F10920 Alcohol use, unspecified with intoxication, uncomplicated: Secondary | ICD-10-CM | POA: Insufficient documentation

## 2019-01-29 DIAGNOSIS — F329 Major depressive disorder, single episode, unspecified: Secondary | ICD-10-CM | POA: Diagnosis not present

## 2019-01-29 DIAGNOSIS — R451 Restlessness and agitation: Secondary | ICD-10-CM

## 2019-01-29 DIAGNOSIS — F1094 Alcohol use, unspecified with alcohol-induced mood disorder: Secondary | ICD-10-CM | POA: Diagnosis present

## 2019-01-29 LAB — COMPREHENSIVE METABOLIC PANEL
ALT: 32 U/L (ref 0–44)
AST: 34 U/L (ref 15–41)
Albumin: 3.8 g/dL (ref 3.5–5.0)
Alkaline Phosphatase: 107 U/L (ref 38–126)
Anion gap: 10 (ref 5–15)
BUN: 31 mg/dL — ABNORMAL HIGH (ref 8–23)
CO2: 23 mmol/L (ref 22–32)
Calcium: 8.8 mg/dL — ABNORMAL LOW (ref 8.9–10.3)
Chloride: 111 mmol/L (ref 98–111)
Creatinine, Ser: 1.32 mg/dL — ABNORMAL HIGH (ref 0.61–1.24)
GFR calc Af Amer: >60 mL/min (ref >60)
GFR calc non Af Amer: 54 mL/min — ABNORMAL LOW (ref >60)
Glucose, Bld: 112 mg/dL — ABNORMAL HIGH (ref 70–99)
Potassium: 3.8 mmol/L (ref 3.5–5.1)
Sodium: 144 mmol/L (ref 135–145)
Total Bilirubin: 0.5 mg/dL (ref 0.3–1.2)
Total Protein: 6.7 g/dL (ref 6.5–8.1)

## 2019-01-29 LAB — ETHANOL: Alcohol, Ethyl (B): 196 mg/dL — ABNORMAL HIGH (ref <10)

## 2019-01-29 LAB — SALICYLATE LEVEL: Salicylate Lvl: <7 mg/dL (ref 2.8–30.0)

## 2019-01-29 LAB — CBC
HCT: 39.5 % (ref 39.0–52.0)
Hemoglobin: 11.5 g/dL — ABNORMAL LOW (ref 13.0–17.0)
MCH: 19.3 pg — ABNORMAL LOW (ref 26.0–34.0)
MCHC: 29.1 g/dL — ABNORMAL LOW (ref 30.0–36.0)
MCV: 66.3 fL — ABNORMAL LOW (ref 80.0–100.0)
Platelets: 291 10*3/uL (ref 150–400)
RBC: 5.96 MIL/uL — ABNORMAL HIGH (ref 4.22–5.81)
RDW: 22.8 % — ABNORMAL HIGH (ref 11.5–15.5)
WBC: 8.2 10*3/uL (ref 4.0–10.5)
nRBC: 0 % (ref 0.0–0.2)

## 2019-01-29 LAB — URINE DRUG SCREEN, QUALITATIVE (ARMC ONLY)
Amphetamines, Ur Screen: NOT DETECTED
Barbiturates, Ur Screen: NOT DETECTED
Benzodiazepine, Ur Scrn: NOT DETECTED
Cannabinoid 50 Ng, Ur ~~LOC~~: NOT DETECTED
Cocaine Metabolite,Ur ~~LOC~~: NOT DETECTED
MDMA (Ecstasy)Ur Screen: NOT DETECTED
Methadone Scn, Ur: NOT DETECTED
Opiate, Ur Screen: NOT DETECTED
Phencyclidine (PCP) Ur S: NOT DETECTED
Tricyclic, Ur Screen: NOT DETECTED

## 2019-01-29 LAB — ACETAMINOPHEN LEVEL: Acetaminophen (Tylenol), Serum: <10 ug/mL — ABNORMAL LOW (ref 10–30)

## 2019-01-29 MED ORDER — HALOPERIDOL LACTATE 5 MG/ML IJ SOLN
5.0000 mg | Freq: Once | INTRAMUSCULAR | Status: AC
  Administered 2019-01-29: 21:00:00 5 mg via INTRAMUSCULAR
  Filled 2019-01-29: qty 1

## 2019-01-29 MED ORDER — DIPHENHYDRAMINE HCL 50 MG/ML IJ SOLN
50.0000 mg | Freq: Once | INTRAMUSCULAR | Status: AC
  Administered 2019-01-29: 50 mg via INTRAMUSCULAR
  Filled 2019-01-29: qty 1

## 2019-01-29 MED ORDER — LORAZEPAM 2 MG/ML IJ SOLN
2.0000 mg | Freq: Once | INTRAMUSCULAR | Status: AC
  Administered 2019-01-29: 21:00:00 2 mg via INTRAMUSCULAR
  Filled 2019-01-29: qty 1

## 2019-01-29 NOTE — ED Notes (Signed)
Pt resting quietly with eyes open. Alert and answering questions. Appears more calm and cooperative at this time. Will continue to assess.

## 2019-01-29 NOTE — ED Provider Notes (Signed)
[facility] Emergency Department Provider Note  ____________________________________________  Time seen: Approximately 11:31 PM  I have reviewed the triage vital signs and the nursing notes.   HISTORY  Chief Complaint Alcohol Intoxication    Level 5 Caveat: Portions of the History and Physical including HPI and review of systems are unable to be completely obtained due to patient being a poor historian   HPI Omar Davis is a 68 y.o. male with a history of hypertension PTSD and TBI and alcohol abuse who comes to the ED under IVC by police.  He was found laying on the ground outside in public.  Patient is verbally abusive, shouting obscenities.  States that he drank a pint of vodka today but does not drink every day.  Patient is unable to restrain his behavior or be redirected.      Past Medical History:  Diagnosis Date  . Chronic pain   . Depressed   . Hypertension   . PTSD (post-traumatic stress disorder)   . TBI (traumatic brain injury) [facility])      Patient Active Problem List   Diagnosis Date Noted  . Alcohol-induced mood disorder ([place]) 01/24/2019  . Alcohol dependence with intoxication, uncomplicated ([place]) [id number]  . Suicidal ideation 09/25/2015  . Alcohol abuse 09/09/2015  . PTSD (post-traumatic stress disorder) 09/09/2015  . Agitation 09/09/2015  . Depressed   . Chronic pain      Past Surgical History:  Procedure Laterality Date  . BACK SURGERY    . HERNIA REPAIR    . KNEE SURGERY    . KNEE SURGERY    . prostectomy N/A      Prior to Admission medications   Medication Sig Start Date End Date Taking? Authorizing Provider  clobetasol cream (TEMOVATE) [id number] % Apply 1 application topically 2 (two) times daily.    [provider]  divalproex (DEPAKOTE ER) 500 MG 24 hr tablet Take 1,000 mg by mouth 2 (two) times daily.     [provider]  DULoxetine (CYMBALTA) 30 MG capsule Take 30 mg by mouth daily.      [provider]  hydrOXYzine (ATARAX/VISTARIL) 10 MG tablet Take 1 tablet (10 mg total) by mouth 3 (three) times daily as needed. 01/16/19   Merlyn Lot, MD  ibuprofen (ADVIL,MOTRIN) 800 MG tablet Take 800 mg by mouth every 8 (eight) hours as needed.    [provider]  lisinopril (PRINIVIL,ZESTRIL) 10 MG tablet Take 10 mg by mouth daily.    [provider]  nystatin ointment (MYCOSTATIN) Apply 1 application topically 2 (two) times daily.    [provider]  pravastatin (PRAVACHOL) 80 MG tablet Take 80 mg by mouth at bedtime.     [provider]  tolnaftate (TINACTIN) 1 % powder Apply 1 application topically 2 (two) times daily.    [provider]     Allergies Codeine, Gabapentin, and Penicillins   No family history on file.  Social History Social History   Tobacco Use  . Smoking status: Never Smoker  . Smokeless tobacco: Never Used  Substance Use Topics  . Alcohol use: Yes    Comment: 1 pint of vodka  . Drug use: No    Review of Systems Level 5 Caveat: Portions of the History and Physical including HPI and review of systems are unable to be completely obtained due to patient being a poor historian   Constitutional:   No known fever.  ENT:   No rhinorrhea. Cardiovascular:  No chest pain or syncope. Respiratory:   No dyspnea or cough. Gastrointestinal:   Negative for abdominal pain, vomiting and diarrhea.  Musculoskeletal:   Negative for focal pain or swelling ____________________________________________   PHYSICAL EXAM:  VITAL SIGNS: ED Triage Vitals  Enc Vitals Group     BP --      Pulse --      Resp --      Temp --      Temp src --      SpO2 --      Weight 01/29/19 2059 170 lb (77.1 kg)     Height 01/29/19 2059 5\' 8"  (1.727 m)     Head Circumference --      Peak Flow --      Pain Score 01/29/19 2058 8     Pain Loc --      Pain Edu? --      Excl. in [place]? --     Vital signs reviewed, nursing  assessments reviewed.   Constitutional: Awake and alert, not oriented. Non-toxic appearance. Eyes:   Conjunctivae are normal. EOMI. PERRL. ENT      Head:   Normocephalic and atraumatic.      Nose:   No congestion/rhinnorhea.       Mouth/Throat:   MMM, no pharyngeal erythema. No peritonsillar mass.       Neck:   No meningismus. Full ROM. Hematological/Lymphatic/Immunilogical:   No cervical lymphadenopathy. Cardiovascular:   RRR.  Cap refill less than 2 seconds. Respiratory:   Normal respiratory effort without tachypnea/retractions. Gastrointestinal:   Soft and nontender. Non distended.   No rebound, rigidity, or guarding.  Musculoskeletal:   Normal range of motion in all extremities. No joint effusions.  No lower extremity tenderness.  No edema. Neurologic:   Slurred speech, agitated, obscene. Ambulatory with steady gait.  Motor grossly intact.  Skin:    Skin is warm, dry and intact. No rash noted.  No petechiae, purpura, or bullae.  ____________________________________________    LABS (pertinent positives/negatives) (all labs ordered are listed, but only abnormal results are displayed) Labs Reviewed  COMPREHENSIVE METABOLIC PANEL - Abnormal; Notable for the following components:      Result Value   Glucose, Bld 112 (*)    BUN 31 (*)    Creatinine, Ser 1.32 (*)    Calcium 8.8 (*)    GFR calc non Af Amer 54 (*)    All other components within normal limits  ETHANOL - Abnormal; Notable for the following components:   Alcohol, Ethyl (B) 196 (*)    All other components within normal limits  ACETAMINOPHEN LEVEL - Abnormal; Notable for the following components:   Acetaminophen (Tylenol), Serum <10 (*)    All other components within normal limits  CBC - Abnormal; Notable for the following components:   RBC 5.96 (*)    Hemoglobin 11.5 (*)    MCV 66.3 (*)    MCH 19.3 (*)    MCHC 29.1 (*)    RDW 22.8 (*)    All other components within normal limits  SALICYLATE LEVEL  URINE  DRUG SCREEN, QUALITATIVE ([facility] ONLY)   ____________________________________________   EKG    ____________________________________________    RADIOLOGY  No results found.  ____________________________________________   PROCEDURES .Critical Care Performed by: Carrie Mew, MD Authorized by: Carrie Mew, MD   Critical care provider statement:    Critical care time (minutes):  35   Critical care was necessary to treat or prevent imminent or life-threatening deterioration  of the following conditions:  CNS failure or compromise and toxidrome   Critical care was time spent personally by me on the following activities:  Discussions with consultants, evaluation of patient's response to treatment, examination of patient, ordering and performing treatments and interventions, ordering and review of laboratory studies, ordering and review of radiographic studies, pulse oximetry, re-evaluation of patient's condition, obtaining history from patient or surrogate and review of old charts    ____________________________________________    CLINICAL IMPRESSION / [place] / ED COURSE  Pertinent labs & imaging results that were available during my care of the patient were reviewed by me and considered in my medical decision making (see chart for details).   Omar Davis was evaluated in Emergency Department on 01/29/2019 for the symptoms described in the history of present illness. He was evaluated in the context of the global COVID-19 pandemic, which necessitated consideration that the patient might be at risk for infection with the SARS-CoV-2 virus that causes COVID-19. Institutional protocols and algorithms that pertain to the evaluation of patients at risk for COVID-19 are in a state of rapid change based on information released by regulatory bodies including the CDC and federal and state organizations. These policies and algorithms were followed during the patient's  care in the ED.   Patient presents with likely alcohol intoxication.  He is agitated and combative, not able to allow for safe environment for himself or staff.  Will give Haldol 5 mg IM, Benadryl 50 mg IM, Ativan 2 mg IM to calm him and treat his acute agitation.  Clinical Course as of Jan 28 2330  Tue Jan 29, 2019  2202 Pt now calm, sleeping after receiving haldol, ativan, benadryl IM for calming of acute agitation. Will continue to observe in ED until able to be eval'd by psych.    [PS]    Clinical Course User Index [PS] Carrie Mew, MD    ----------------------------------------- 11:36 PM on 01/29/2019 -----------------------------------------  Labs all reassuring, at baseline.  Alcohol level 196.  No evidence of coingestion.  Will await psychiatric evaluation and disposition.   ____________________________________________   FINAL CLINICAL IMPRESSION(S) / ED DIAGNOSES    Final diagnoses:  Alcoholic intoxication without complication ([place])  Agitation     ED Discharge Orders    None      Portions of this note were generated with dragon dictation software. Dictation errors may occur despite best attempts at proofreading.   Carrie Mew, MD 01/29/19 [phone]

## 2019-01-29 NOTE — ED Notes (Signed)
Pt yelling and cursing loudly. States he hates the [place] and that he has PTSD from [place]. Hypersexual and aggressive behavior. Pt seen punching the wall. MD aware.

## 2019-01-29 NOTE — ED Triage Notes (Signed)
Pt to the er for IVC. Pt states he h as head and shoulder pain. Pt is yelling and cursing.

## 2019-01-30 DIAGNOSIS — F1094 Alcohol use, unspecified with alcohol-induced mood disorder: Secondary | ICD-10-CM

## 2019-01-30 NOTE — ED Notes (Signed)
BEHAVIORAL HEALTH ROUNDING Patient sleeping: No. Patient alert and oriented: yes Behavior appropriate: Yes.  ; If no, describe:  Nutrition and fluids offered: yes Toileting and hygiene offered: Yes  Sitter present: q15 minute observations Law enforcement present: Yes  ACSD  

## 2019-01-30 NOTE — ED Notes (Signed)
IVC/Consult ordered/Attempted/ Re-Evaluation in Am due to Meds & ETOH

## 2019-01-30 NOTE — ED Notes (Signed)

## 2019-01-30 NOTE — ED Notes (Signed)
IVC rescinded per Marvia Pickles, NP.  Pt pending D/C

## 2019-01-30 NOTE — Progress Notes (Signed)
Omar Davis is a 68 y.o. male patient arrived via law enforcement from a retirement community under involuntary commitment status (IVC). The patient was seen in the ED last week for similar behaviors and was under the influence of alcohol. Per the ED triage nursing note, the initial patient assessment, he is voicing complaints of hand and shoulder pain and becoming verbally aggressive by yelling and cursing and nursing staff.  The patient's ETOH level is 196 mg/dl.  He presented with some aggression and hypersexual behavior towards staff.  He was given Benadryl 50 mg IM, Haldol 5 mg IM, and Lorazepam 2 mg IM due to his aggressive behaviors.  This provider cannot assess the patient due to him being medicated and with his increased alcohol level.  He will remain under observation and has to be evaluated by psychiatry in the a.m.

## 2019-01-30 NOTE — ED Notes (Signed)
ED  Is the patient under IVC or is there intent for IVC:  Rescinded this am by NP Money   Is the patient medically cleared: Yes.   Is there vacancy in the ED BHU: Yes.   Is the population mix appropriate for patient: Yes.   Is the patient awaiting placement in inpatient or outpatient setting: pt to discharge    Has the patient had a psychiatric consult: Yes.   Survey of unit performed for contraband, proper placement and condition of furniture, tampering with fixtures in bathroom, shower, and each patient room: Yes.  ; Findings:  APPEARANCE/BEHAVIOR Calm and cooperative NEURO ASSESSMENT Orientation: oriented x3  Denies pain Hallucinations: No.None noted (Hallucinations)  Denies  Speech: Normal Gait: normal RESPIRATORY ASSESSMENT Even  Unlabored respirations  CARDIOVASCULAR ASSESSMENT Pulses equal   regular rate  Skin warm and dry   GASTROINTESTINAL ASSESSMENT no GI complaint EXTREMITIES Full ROM  PLAN OF CARE Provide calm/safe environment. Vital signs assessed twice daily. ED BHU Assessment once each 12-hour shift. Collaborate with TTS daily or as condition indicates. Assure the ED provider has rounded once each shift. Provide and encourage hygiene. Provide redirection as needed. Assess for escalating behavior; address immediately and inform ED provider.  Assess family dynamic and appropriateness for visitation as needed: Yes.  ; If necessary, describe findings:  Educate the patient/family about BHU procedures/visitation: Yes.  ; If necessary, describe findings:

## 2019-01-30 NOTE — ED Provider Notes (Signed)
Patient appears sober at this time, has been cleared by psychiatry for discharge.   Earleen Newport, MD 01/30/19 1020

## 2019-01-30 NOTE — ED Notes (Signed)
Patient observed lying in bed with eyes closed  Even, unlabored respirations observed   NAD pt appears to be sleeping  I will continue to monitor along with every 15 minute visual observations     

## 2019-01-30 NOTE — Consult Note (Signed)
[place] [facility] Consult   Reason for Consult:  ED visit for alcohol intoxication    Referring Physician:  EDP Patient Identification: Omar Davis MRN:  [id number] Principal Diagnosis: Alcohol-induced mood disorder ([place]) Diagnosis:  Principal Problem:   Alcohol-induced mood disorder ([place])   Total Time spent with patient: 30 minutes  Subjective:   Omar Davis is a 68 y.o. male patient admitted for alcohol intoxication. Patient states that he drank a pint of vodka last night and blacked out and does not remember much of what happened after that. He states that since his brain surgery in March he has been having pain and abnormal sensations and alcohol is the only thing that numbs the pain. He states that he sees a neurologist (who he will be seeing again on the 14th of September) and was previously given gabapentin however it put him into a code blue so he can no longer take it and the neurologist is hesitant to give him anything else to try. He admits drinking 4 other nights this week but did not drink as much as he did last night.   HPI:  Per Dr. Joni Fears: 68 y.o. male with a history of hypertension PTSD and TBI and alcohol abuse who comes to the ED under IVC by police.  He was found laying on the ground outside in public.  Patient is verbally abusive, shouting obscenities.  States that he drank a pint of vodka today but does not drink every day.  Patient is unable to restrain his behavior or be redirected.  Patient is seen by this provider face-to-face. Pt has been placed in multiple homes but has been kicked out due to excessive alcohol use. He is aware of the dangers of alcohol on his health and decision making abilities. Patient requested that I contact Lowell Guitar, his [facility] caseworker, and Leanna Sato reported that he was going to see the patient today at the hotel and he had no safety concerns for the patient. He has continued to deny any suicidal or homicidal ideations and  denies any hallucinations. He denies any withdrawal symptoms. At this time he does not meet inpatient criteria and is psychiatrically cleared. I have rescinded his IVC and notified Dr. Jimmye Norman of the recommendations and plan.   Past Psychiatric History: History of PTSD, TBI, alcohol abuse  Risk to Self:   Risk to Others:   Prior Inpatient Therapy:   Prior Outpatient Therapy:    Past Medical History:  Past Medical History:  Diagnosis Date  . Chronic pain   . Depressed   . Hypertension   . PTSD (post-traumatic stress disorder)   . TBI (traumatic brain injury) [facility])     Past Surgical History:  Procedure Laterality Date  . BACK SURGERY    . HERNIA REPAIR    . KNEE SURGERY    . KNEE SURGERY    . prostectomy N/A    Family History: No family history on file.  Social History:  Social History   Substance and Sexual Activity  Alcohol Use Yes   Comment: 1 pint of vodka     Social History   Substance and Sexual Activity  Drug Use No    Social History   Socioeconomic History  . Marital status: Legally Separated    Spouse name: Not on file  . Number of children: Not on file  . Years of education: Not on file  . Highest education level: Not on file  Occupational History  . Not on  file  Social Needs  . Financial resource strain: Not on file  . Food insecurity    Worry: Not on file    Inability: Not on file  . Transportation needs    Medical: Not on file    Non-medical: Not on file  Tobacco Use  . Smoking status: Never Smoker  . Smokeless tobacco: Never Used  Substance and Sexual Activity  . Alcohol use: Yes    Comment: 1 pint of vodka  . Drug use: No  . Sexual activity: Not Currently  Lifestyle  . Physical activity    Days per week: Not on file    Minutes per session: Not on file  . Stress: Not on file  Relationships  . Social Herbalist on phone: Not on file    Gets together: Not on file    Attends religious service: Not on file    Active member  of club or organization: Not on file    Attends meetings of clubs or organizations: Not on file    Relationship status: Not on file  Other Topics Concern  . Not on file  Social History Narrative  . Not on file   Additional Social History:    Allergies:   Allergies  Allergen Reactions  . Codeine Hives and Rash  . Gabapentin Other (See Comments) and Anaphylaxis    seizures Other reaction(s): Other (See Comments) seizures Anaphylactic Shock   . Penicillins Rash    Other reaction(s): Other (See Comments) Other Reaction: Not Assessed Rash     Labs:  Results for orders placed or performed during the hospital encounter of 01/29/19 (from the past 48 hour(s))  Comprehensive metabolic panel     Status: Abnormal   Collection Time: 01/29/19  8:55 PM  Result Value Ref Range   Sodium 144 135 - 145 mmol/L   Potassium 3.8 3.5 - 5.1 mmol/L   Chloride 111 98 - 111 mmol/L   CO2 23 22 - 32 mmol/L   Glucose, Bld 112 (H) 70 - 99 mg/dL   BUN 31 (H) 8 - 23 mg/dL   Creatinine, Ser 1.32 (H) 0.61 - 1.24 mg/dL   Calcium 8.8 (L) 8.9 - 10.3 mg/dL   Total Protein 6.7 6.5 - 8.1 g/dL   Albumin 3.8 3.5 - 5.0 g/dL   AST 34 15 - 41 U/L   ALT 32 0 - 44 U/L   Alkaline Phosphatase 107 38 - 126 U/L   Total Bilirubin 0.5 0.3 - 1.2 mg/dL   GFR calc non Af Amer 54 (L) >60 mL/min   GFR calc Af Amer >60 >60 mL/min   Anion gap 10 5 - 15    Comment: Performed at [facility], [address], [place], [place] [address]  Ethanol     Status: Abnormal   Collection Time: 01/29/19  8:55 PM  Result Value Ref Range   Alcohol, Ethyl (B) 196 (H) <10 mg/dL    Comment: (NOTE) Lowest detectable limit for serum alcohol is 10 mg/dL. For medical purposes only. Performed at [facility], [address] [place], [place] [id number]   Salicylate level     Status: None   Collection Time: 01/29/19  8:55 PM  Result Value Ref Range   Salicylate Lvl [id number] 2.8 - 30.0 mg/dL    Comment: Performed at  [facility], [address] [place], [place] [address]  Acetaminophen level     Status: Abnormal   Collection Time: 01/29/19  8:55  PM  Result Value Ref Range   Acetaminophen (Tylenol), Serum <10 (L) 10 - 30 ug/mL    Comment: (NOTE) Therapeutic concentrations vary significantly. A range of 10-30 ug/mL  may be an effective concentration for many patients. However, some  are best treated at concentrations outside of this range. Acetaminophen concentrations >150 ug/mL at 4 hours after ingestion  and >50 ug/mL at 12 hours after ingestion are often associated with  toxic reactions. Performed at [facility], [address] [place], [place] [address]   cbc     Status: Abnormal   Collection Time: 01/29/19  8:55 PM  Result Value Ref Range   WBC 8.2 4.0 - 10.5 K/uL   RBC 5.96 (H) 4.22 - 5.81 MIL/uL   Hemoglobin 11.5 (L) 13.0 - 17.0 g/dL   HCT 39.5 39.0 - 52.0 %   MCV 66.3 (L) 80.0 - 100.0 fL   MCH 19.3 (L) 26.0 - 34.0 pg   MCHC 29.1 (L) 30.0 - 36.0 g/dL   RDW 22.8 (H) 11.5 - 15.5 %   Platelets 291 150 - 400 K/uL   nRBC 0.0 0.0 - 0.2 %    Comment: Performed at [facility], [address] [place], [place] [address]  Urine Drug Screen, Qualitative     Status: None   Collection Time: 01/29/19  9:44 PM  Result Value Ref Range   Tricyclic, Ur Screen NONE DETECTED NONE DETECTED   Amphetamines, Ur Screen NONE DETECTED NONE DETECTED   MDMA (Ecstasy)Ur Screen NONE DETECTED NONE DETECTED   Cocaine Metabolite,Ur [place] Range NONE DETECTED NONE DETECTED   Opiate, Ur Screen NONE DETECTED NONE DETECTED   Phencyclidine (PCP) Ur S NONE DETECTED NONE DETECTED   Cannabinoid 50 Ng, Ur [place] NONE DETECTED NONE DETECTED   Barbiturates, Ur Screen NONE DETECTED NONE DETECTED   Benzodiazepine, Ur Scrn NONE DETECTED NONE DETECTED   Methadone Scn, Ur NONE DETECTED NONE DETECTED    Comment: (NOTE) Tricyclics + metabolites, urine    Cutoff 1000 ng/mL Amphetamines + metabolites, urine   Cutoff 1000 ng/mL MDMA (Ecstasy), urine              Cutoff 500 ng/mL Cocaine Metabolite, urine          Cutoff 300 ng/mL Opiate + metabolites, urine        Cutoff 300 ng/mL Phencyclidine (PCP), urine         Cutoff 25 ng/mL Cannabinoid, urine                 Cutoff 50 ng/mL Barbiturates + metabolites, urine  Cutoff 200 ng/mL Benzodiazepine, urine              Cutoff 200 ng/mL Methadone, urine                   Cutoff 300 ng/mL The urine drug screen provides only a preliminary, unconfirmed analytical test result and should not be used for non-medical purposes. Clinical consideration and professional judgment should be applied to any positive drug screen result due to possible interfering substances. A more specific alternate chemical method must be used in order to obtain a confirmed analytical result. Gas chromatography / mass spectrometry (GC/MS) is the preferred confirmat ory method. Performed at [facility], [address] [place], [place] [address]     No current facility-administered medications for this encounter.    Current Outpatient Medications  Medication Sig Dispense Refill  . DULoxetine (CYMBALTA) 30 MG capsule Take 30 mg by mouth  daily.     . hydrOXYzine (ATARAX/VISTARIL) 10 MG tablet Take 1 tablet (10 mg total) by mouth 3 (three) times daily as needed. 12 tablet 0  . lisinopril (PRINIVIL,ZESTRIL) 10 MG tablet Take 10 mg by mouth daily.    . pravastatin (PRAVACHOL) 80 MG tablet Take 80 mg by mouth at bedtime.     . clobetasol cream (TEMOVATE) AB-123456789 % Apply 1 application topically 2 (two) times daily.    . divalproex (DEPAKOTE ER) 500 MG 24 hr tablet Take 1,000 mg by mouth 2 (two) times daily.     Marland Kitchen ibuprofen (ADVIL,MOTRIN) 800 MG tablet Take 800 mg by mouth every 8 (eight) hours as needed.    . nystatin ointment (MYCOSTATIN) Apply 1 application topically 2 (two) times daily.    Marland Kitchen tolnaftate (TINACTIN) 1 % powder Apply 1 application topically 2 (two) times  daily.      Musculoskeletal: Strength & Muscle Tone: within normal limits Gait & Station: pt in bed, unable to assess Patient leans: N/A  Psychiatric Specialty Exam: Physical Exam  Nursing note and vitals reviewed. Constitutional: He is oriented to person, place, and time. He appears well-developed and well-nourished.  Cardiovascular: Normal rate.  Respiratory: Effort normal.  Musculoskeletal: Normal range of motion.  Neurological: He is alert and oriented to person, place, and time.    Review of Systems  Constitutional: Negative.   HENT: Negative.   Eyes: Negative.   Respiratory: Negative.   Cardiovascular: Negative.   Gastrointestinal: Negative.   Genitourinary: Negative.   Musculoskeletal: Negative.   Skin: Negative.   Neurological: Positive for tingling.  Endo/Heme/Allergies: Negative.   Psychiatric/Behavioral: Positive for substance abuse.    Height 5\' 8"  (1.727 m), weight 77.1 kg.Body mass index is 25.85 kg/m.  General Appearance: Fairly Groomed  Eye Contact:  Good  Speech:  Normal Rate and clear speech  Volume:  Normal  Mood:  Depressed  Affect:  Appropriate  Thought Process:  Coherent and Descriptions of Associations: Intact  Orientation:  Full (Time, Place, and Person)  Thought Content:  Logical  Suicidal Thoughts:  No  Homicidal Thoughts:  No  Memory:  appropriate however he does not remember what happens when intoxicated  Judgement:  Other:  altered with alcohol use  Insight:  Fair  Psychomotor Activity:  Normal  Concentration:  Concentration: Good  Recall:  fair with poor memory of events when intoxicated  [employer] of Knowledge:  Fair  Language:  Good  Akathisia:  Negative  Handed:  Right  AIMS (if indicated):     Assets:  Agricultural consultant Housing Resilience Social Support Others:  medical help from the [place]  ADL's:  Intact  Cognition:  WNL  Sleep:        Treatment Plan Summary: Plan Pt states that he is  seeing his neurologist next week and will discuss further treatments for pain control, as pain is the reason he states he drinks  [facility] caseworker is coming to see the patient today at the hottel  Disposition: No evidence of imminent risk to self or others at present.   Patient does not meet criteria for psychiatric inpatient admission. Supportive therapy provided about ongoing stressors. Discussed crisis plan, support from social network, calling 911, coming to the Emergency Department, and calling Suicide Hotline.  [place], FNP 01/30/2019 10:06 AM

## 2019-01-30 NOTE — ED Notes (Signed)
IVC/Consult ordered/pending 

## 2019-01-30 NOTE — ED Notes (Signed)
Pt asleep, breakfast tray placed on sink in rm.  

## 2019-02-01 ENCOUNTER — Other Ambulatory Visit: Payer: Self-pay

## 2019-02-01 ENCOUNTER — Emergency Department
Admission: EM | Admit: 2019-02-01 | Discharge: 2019-02-02 | Disposition: A | Discharge status: Home / Self Care | Payer: No Typology Code available for payment source | Attending: Emergency Medicine | Admitting: Emergency Medicine

## 2019-02-01 DIAGNOSIS — F10220 Alcohol dependence with intoxication, uncomplicated: Secondary | ICD-10-CM | POA: Insufficient documentation

## 2019-02-01 DIAGNOSIS — F10920 Alcohol use, unspecified with intoxication, uncomplicated: Secondary | ICD-10-CM

## 2019-02-01 DIAGNOSIS — F1094 Alcohol use, unspecified with alcohol-induced mood disorder: Secondary | ICD-10-CM | POA: Diagnosis not present

## 2019-02-01 DIAGNOSIS — F4310 Post-traumatic stress disorder, unspecified: Secondary | ICD-10-CM | POA: Diagnosis not present

## 2019-02-01 DIAGNOSIS — I10 Essential (primary) hypertension: Secondary | ICD-10-CM | POA: Insufficient documentation

## 2019-02-01 DIAGNOSIS — F10150 Alcohol abuse with alcohol-induced psychotic disorder with delusions: Secondary | ICD-10-CM | POA: Diagnosis not present

## 2019-02-01 DIAGNOSIS — Z79899 Other long term (current) drug therapy: Secondary | ICD-10-CM | POA: Diagnosis not present

## 2019-02-01 DIAGNOSIS — F332 Major depressive disorder, recurrent severe without psychotic features: Secondary | ICD-10-CM | POA: Diagnosis not present

## 2019-02-01 DIAGNOSIS — F1010 Alcohol abuse, uncomplicated: Secondary | ICD-10-CM

## 2019-02-01 DIAGNOSIS — F1020 Alcohol dependence, uncomplicated: Secondary | ICD-10-CM | POA: Diagnosis present

## 2019-02-01 DIAGNOSIS — R45851 Suicidal ideations: Secondary | ICD-10-CM | POA: Insufficient documentation

## 2019-02-01 DIAGNOSIS — F1014 Alcohol abuse with alcohol-induced mood disorder: Secondary | ICD-10-CM | POA: Diagnosis not present

## 2019-02-01 DIAGNOSIS — F10121 Alcohol abuse with intoxication delirium: Secondary | ICD-10-CM | POA: Diagnosis not present

## 2019-02-01 DIAGNOSIS — Z046 Encounter for general psychiatric examination, requested by authority: Secondary | ICD-10-CM | POA: Diagnosis present

## 2019-02-01 LAB — URINE DRUG SCREEN, QUALITATIVE (ARMC ONLY)
Amphetamines, Ur Screen: NOT DETECTED
Barbiturates, Ur Screen: NOT DETECTED
Benzodiazepine, Ur Scrn: NOT DETECTED
Cannabinoid 50 Ng, Ur ~~LOC~~: NOT DETECTED
Cocaine Metabolite,Ur ~~LOC~~: NOT DETECTED
MDMA (Ecstasy)Ur Screen: NOT DETECTED
Methadone Scn, Ur: NOT DETECTED
Opiate, Ur Screen: NOT DETECTED
Phencyclidine (PCP) Ur S: NOT DETECTED
Tricyclic, Ur Screen: NOT DETECTED

## 2019-02-01 LAB — CBC
HCT: 41.2 % (ref 39.0–52.0)
Hemoglobin: 12.4 g/dL — ABNORMAL LOW (ref 13.0–17.0)
MCH: 19.7 pg — ABNORMAL LOW (ref 26.0–34.0)
MCHC: 30.1 g/dL (ref 30.0–36.0)
MCV: 65.3 fL — ABNORMAL LOW (ref 80.0–100.0)
Platelets: 331 10*3/uL (ref 150–400)
RBC: 6.31 MIL/uL — ABNORMAL HIGH (ref 4.22–5.81)
RDW: 23.3 % — ABNORMAL HIGH (ref 11.5–15.5)
WBC: 9 10*3/uL (ref 4.0–10.5)
nRBC: 0 % (ref 0.0–0.2)

## 2019-02-01 LAB — COMPREHENSIVE METABOLIC PANEL
ALT: 39 U/L (ref 0–44)
AST: 61 U/L — ABNORMAL HIGH (ref 15–41)
Albumin: 4.1 g/dL (ref 3.5–5.0)
Alkaline Phosphatase: 132 U/L — ABNORMAL HIGH (ref 38–126)
Anion gap: 10 (ref 5–15)
BUN: 27 mg/dL — ABNORMAL HIGH (ref 8–23)
CO2: 20 mmol/L — ABNORMAL LOW (ref 22–32)
Calcium: 9 mg/dL (ref 8.9–10.3)
Chloride: 115 mmol/L — ABNORMAL HIGH (ref 98–111)
Creatinine, Ser: 1.14 mg/dL (ref 0.61–1.24)
GFR calc Af Amer: >60 mL/min (ref >60)
GFR calc non Af Amer: >60 mL/min (ref >60)
Glucose, Bld: 98 mg/dL (ref 70–99)
Potassium: 3.8 mmol/L (ref 3.5–5.1)
Sodium: 145 mmol/L (ref 135–145)
Total Bilirubin: 0.6 mg/dL (ref 0.3–1.2)
Total Protein: 7 g/dL (ref 6.5–8.1)

## 2019-02-01 LAB — SALICYLATE LEVEL: Salicylate Lvl: <7 mg/dL (ref 2.8–30.0)

## 2019-02-01 LAB — ETHANOL: Alcohol, Ethyl (B): 235 mg/dL — ABNORMAL HIGH (ref <10)

## 2019-02-01 LAB — ACETAMINOPHEN LEVEL: Acetaminophen (Tylenol), Serum: <10 ug/mL — ABNORMAL LOW (ref 10–30)

## 2019-02-01 NOTE — ED Notes (Signed)
Green colored t shirt, jeans, brown colored belt.  Wallet black colored with $5 bill.  Black and orange colored socks and black colored shoes.  Belongings placed in belongings back and labeled to be secured on the unit.

## 2019-02-01 NOTE — ED Triage Notes (Signed)
Patient to ED voluntarily with [facility].  Patient reports having PTSD and having some thoughts of wanting to harm himself.

## 2019-02-02 ENCOUNTER — Emergency Department (EMERGENCY_DEPARTMENT_HOSPITAL)
Admission: EM | Admit: 2019-02-02 | Discharge: 2019-02-03 | Disposition: A | Discharge status: Home / Self Care | Payer: No Typology Code available for payment source | Source: Home / Self Care | Attending: Emergency Medicine | Admitting: Emergency Medicine

## 2019-02-02 DIAGNOSIS — F1020 Alcohol dependence, uncomplicated: Secondary | ICD-10-CM | POA: Diagnosis present

## 2019-02-02 DIAGNOSIS — F329 Major depressive disorder, single episode, unspecified: Secondary | ICD-10-CM | POA: Insufficient documentation

## 2019-02-02 DIAGNOSIS — F1094 Alcohol use, unspecified with alcohol-induced mood disorder: Secondary | ICD-10-CM

## 2019-02-02 DIAGNOSIS — F10220 Alcohol dependence with intoxication, uncomplicated: Secondary | ICD-10-CM | POA: Insufficient documentation

## 2019-02-02 DIAGNOSIS — I10 Essential (primary) hypertension: Secondary | ICD-10-CM | POA: Insufficient documentation

## 2019-02-02 DIAGNOSIS — F32A Depression, unspecified: Secondary | ICD-10-CM

## 2019-02-02 DIAGNOSIS — F10929 Alcohol use, unspecified with intoxication, unspecified: Secondary | ICD-10-CM

## 2019-02-02 DIAGNOSIS — Z79899 Other long term (current) drug therapy: Secondary | ICD-10-CM | POA: Insufficient documentation

## 2019-02-02 DIAGNOSIS — Y908 Blood alcohol level of 240 mg/100 ml or more: Secondary | ICD-10-CM | POA: Insufficient documentation

## 2019-02-02 LAB — COMPREHENSIVE METABOLIC PANEL WITH GFR
ALT: 36 U/L (ref 0–44)
AST: 55 U/L — ABNORMAL HIGH (ref 15–41)
Albumin: 3.8 g/dL (ref 3.5–5.0)
Alkaline Phosphatase: 118 U/L (ref 38–126)
Anion gap: 11 (ref 5–15)
BUN: 30 mg/dL — ABNORMAL HIGH (ref 8–23)
CO2: 20 mmol/L — ABNORMAL LOW (ref 22–32)
Calcium: 8.4 mg/dL — ABNORMAL LOW (ref 8.9–10.3)
Chloride: 113 mmol/L — ABNORMAL HIGH (ref 98–111)
Creatinine, Ser: 1.24 mg/dL (ref 0.61–1.24)
GFR calc Af Amer: >60 mL/min (ref >60)
GFR calc non Af Amer: 59 mL/min — ABNORMAL LOW (ref >60)
Glucose, Bld: 117 mg/dL — ABNORMAL HIGH (ref 70–99)
Potassium: 3.9 mmol/L (ref 3.5–5.1)
Sodium: 144 mmol/L (ref 135–145)
Total Bilirubin: 0.8 mg/dL (ref 0.3–1.2)
Total Protein: 6.5 g/dL (ref 6.5–8.1)

## 2019-02-02 LAB — CBC
HCT: 40 % (ref 39.0–52.0)
Hemoglobin: 11.6 g/dL — ABNORMAL LOW (ref 13.0–17.0)
MCH: 19.4 pg — ABNORMAL LOW (ref 26.0–34.0)
MCHC: 29 g/dL — ABNORMAL LOW (ref 30.0–36.0)
MCV: 66.8 fL — ABNORMAL LOW (ref 80.0–100.0)
Platelets: 309 10*3/uL (ref 150–400)
RBC: 5.99 MIL/uL — ABNORMAL HIGH (ref 4.22–5.81)
RDW: 23.2 % — ABNORMAL HIGH (ref 11.5–15.5)
WBC: 10.6 10*3/uL — ABNORMAL HIGH (ref 4.0–10.5)
nRBC: 0 % (ref 0.0–0.2)

## 2019-02-02 LAB — ACETAMINOPHEN LEVEL: Acetaminophen (Tylenol), Serum: <10 ug/mL — ABNORMAL LOW (ref 10–30)

## 2019-02-02 LAB — SALICYLATE LEVEL: Salicylate Lvl: <7 mg/dL (ref 2.8–30.0)

## 2019-02-02 LAB — ETHANOL: Alcohol, Ethyl (B): 276 mg/dL — ABNORMAL HIGH (ref <10)

## 2019-02-02 MED ORDER — LORAZEPAM 2 MG/ML IJ SOLN: 0.0000 mg | Freq: Two times a day (BID) | INTRAMUSCULAR | Status: DC

## 2019-02-02 MED ORDER — LORAZEPAM 2 MG PO TABS: 0.0000 mg | ORAL_TABLET | Freq: Two times a day (BID) | ORAL | Status: DC

## 2019-02-02 MED ORDER — VITAMIN B-1 100 MG PO TABS
100.0000 mg | ORAL_TABLET | Freq: Every day | ORAL | Status: DC
  Administered 2019-02-02 (×2): 100 mg via ORAL
  Filled 2019-02-02: qty 1

## 2019-02-02 MED ORDER — ZIPRASIDONE MESYLATE 20 MG IM SOLR
20.0000 mg | Freq: Once | INTRAMUSCULAR | Status: AC
  Administered 2019-02-02: 14:00:00 20 mg via INTRAMUSCULAR

## 2019-02-02 MED ORDER — LORAZEPAM 2 MG PO TABS
0.0000 mg | ORAL_TABLET | Freq: Four times a day (QID) | ORAL | Status: DC
  Administered 2019-02-02: 2 mg via ORAL
  Filled 2019-02-02: qty 1

## 2019-02-02 MED ORDER — IBUPROFEN 600 MG PO TABS
600.0000 mg | ORAL_TABLET | Freq: Once | ORAL | Status: AC
  Administered 2019-02-02: 08:00:00 600 mg via ORAL
  Filled 2019-02-02: qty 1

## 2019-02-02 MED ORDER — LORAZEPAM 2 MG/ML IJ SOLN
2.0000 mg | Freq: Once | INTRAMUSCULAR | Status: AC
  Administered 2019-02-02: 2 mg via INTRAMUSCULAR

## 2019-02-02 MED ORDER — VITAMIN B-1 100 MG PO TABS
100.0000 mg | ORAL_TABLET | Freq: Every day | ORAL | Status: DC
  Administered 2019-02-02 – 2019-02-03 (×2): 100 mg via ORAL
  Filled 2019-02-02 (×2): qty 1

## 2019-02-02 MED ORDER — LORAZEPAM 2 MG/ML IJ SOLN: 0.0000 mg | Freq: Four times a day (QID) | INTRAMUSCULAR | Status: DC

## 2019-02-02 MED ORDER — ACETAMINOPHEN 325 MG PO TABS
650.0000 mg | ORAL_TABLET | Freq: Once | ORAL | Status: AC
  Administered 2019-02-02: 650 mg via ORAL
  Filled 2019-02-02: qty 2

## 2019-02-02 MED ORDER — ACETAMINOPHEN 325 MG PO TABS
650.0000 mg | ORAL_TABLET | Freq: Once | ORAL | Status: AC
  Administered 2019-02-02: 02:00:00 650 mg via ORAL
  Filled 2019-02-02: qty 2

## 2019-02-02 MED ORDER — LORAZEPAM 2 MG/ML IJ SOLN
2.0000 mg | Freq: Once | INTRAMUSCULAR | Status: DC
  Filled 2019-02-02: qty 1

## 2019-02-02 MED ORDER — LORAZEPAM 2 MG PO TABS
0.0000 mg | ORAL_TABLET | Freq: Four times a day (QID) | ORAL | Status: DC
  Administered 2019-02-02 – 2019-02-03 (×2): 2 mg via ORAL
  Filled 2019-02-02 (×2): qty 1

## 2019-02-02 MED ORDER — THIAMINE HCL 100 MG/ML IJ SOLN
100.0000 mg | Freq: Every day | INTRAMUSCULAR | Status: DC
  Filled 2019-02-02: qty 2

## 2019-02-02 NOTE — ED Provider Notes (Addendum)
[facility] Emergency Department Provider Note  Time seen: 1:48 PM  I have reviewed the triage vital signs and the nursing notes.   HISTORY  Chief Complaint Alcohol Intoxication   HPI Omar Davis is a 68 y.o. male with a past medical history of depression, hypertension, PTSD, TBI, alcohol abuse, presents to the emergency department for depression and alcohol abuse.  Patient was discharged from the emergency department approximately 3 to 4 hours ago.  EMS states they were called by a local Police Department found the patient to be significantly intoxicated with empty bottles of vodka in his room.  Here the patient appears significantly intoxicated, is yelling at times and cursing loudly.  Yells "please help me."  Patient denies any drug use.  Denies any medical complaints at this time.   Past Medical History:  Diagnosis Date  . Chronic pain   . Depressed   . Hypertension   . PTSD (post-traumatic stress disorder)   . TBI (traumatic brain injury) Omar Davis)     Patient Active Problem List   Diagnosis Date Noted  . Alcohol-induced mood disorder ([place]) 01/24/2019  . Alcohol dependence with intoxication, uncomplicated ([place]) [id number]  . Suicidal ideation 09/25/2015  . Alcohol abuse 09/09/2015  . PTSD (post-traumatic stress disorder) 09/09/2015  . Agitation 09/09/2015  . Depressed   . Chronic pain     Past Surgical History:  Procedure Laterality Date  . BACK SURGERY    . HERNIA REPAIR    . KNEE SURGERY    . KNEE SURGERY    . prostectomy N/A     Prior to Admission medications   Medication Sig Start Date End Date Taking? Authorizing Provider  clobetasol cream (TEMOVATE) AB-123456789 % Apply 1 application topically 2 (two) times daily.    [provider]  divalproex (DEPAKOTE ER) 500 MG 24 hr tablet Take 1,000 mg by mouth 2 (two) times daily.     [provider]  DULoxetine (CYMBALTA) 30 MG capsule Take 30 mg by mouth daily.     [provider]  hydrOXYzine (ATARAX/VISTARIL) 10 MG tablet Take 1 tablet (10 mg total) by mouth 3 (three) times daily as needed. 01/16/19   Merlyn Lot, MD  ibuprofen (ADVIL,MOTRIN) 800 MG tablet Take 800 mg by mouth every 8 (eight) hours as needed.    [provider]  lisinopril (PRINIVIL,ZESTRIL) 10 MG tablet Take 10 mg by mouth daily.    [provider]  nystatin ointment (MYCOSTATIN) Apply 1 application topically 2 (two) times daily.    [provider]  pravastatin (PRAVACHOL) 80 MG tablet Take 80 mg by mouth at bedtime.     [provider]  tolnaftate (TINACTIN) 1 % powder Apply 1 application topically 2 (two) times daily.    [provider]    Allergies  Allergen Reactions  . Codeine Hives and Rash  . Gabapentin Other (See Comments) and Anaphylaxis    seizures Other reaction(s): Other (See Comments) seizures Anaphylactic Shock   . Penicillins Rash    Other reaction(s): Other (See Comments) Other Reaction: Not Assessed Rash     No family history on file.  Social History Social History   Tobacco Use  . Smoking status: Never Smoker  . Smokeless tobacco: Never Used  Substance Use Topics  . Alcohol use: Yes    Comment: 1 pint of vodka  . Drug use: No    Review of Systems Unable to obtain adequate/accurate review of systems secondary to acute intoxication  ____________________________________________   PHYSICAL EXAM:  Constitutional: Patient is awake and alert.  Patient agitated at times and cursing loudly at times. Eyes: Normal exam ENT      Head: Normocephalic and atraumatic      Mouth/Throat: Mucous membranes are moist. Cardiovascular: Normal rate, regular rhythm. Respiratory: Normal respiratory effort without tachypnea nor retractions. Breath sounds are clear  Gastrointestinal: Soft and nontender. No distention.  Musculoskeletal: Nontender with normal range of motion in all extremities. Neurologic: Slurred  speech.  No gross focal neurologic deficits, moves all extremities. Skin:  Skin is warm, dry and intact.  Psychiatric: Tearful, appears intoxicated. ____________________________________________    INITIAL IMPRESSION / ASSESSMENT AND PLAN / ED COURSE  Pertinent labs & imaging results that were available during my care of the patient were reviewed by me and considered in my medical decision making (see chart for details).   Patient presents to the emergency department for alcohol intoxication and depression.  Patient just left the emergency department several hours ago.  Given the patient's acute intoxication he is not safe to care for himself we will place the patient under an IVC for his own safety at this time, consult psychiatry TTS and repeat lab work.  Patient has become acutely agitated.  Yelling loudly, continues to try to get up out of bed, unsafe at this time to ambulate or care for himself.  I have placed the patient under an IVC.  We will dose Geodon and closely monitor.  Omar Davis was evaluated in Emergency Department on 02/02/2019 for the symptoms described in the history of present illness. He was evaluated in the context of the global COVID-19 pandemic, which necessitated consideration that the patient might be at risk for infection with the SARS-CoV-2 virus that causes COVID-19. Institutional protocols and algorithms that pertain to the evaluation of patients at risk for COVID-19 are in a state of rapid change based on information released by regulatory bodies including the CDC and federal and state organizations. These policies and algorithms were followed during the patient's care in the ED.  ____________________________________________   FINAL CLINICAL IMPRESSION(S) / ED DIAGNOSES  Alcohol intoxication Depression    Harvest Dark, MD 02/02/19 1351    Harvest Dark, MD 02/02/19 1355

## 2019-02-02 NOTE — ED Notes (Signed)
Pt sleeping. 

## 2019-02-02 NOTE — ED Notes (Signed)
Pt uncooperative, unable to obtain VS. Pt will not stay still for BP. Will not allow temperature to be taken.

## 2019-02-02 NOTE — ED Notes (Signed)
Pt had soiled self. Bed linen changed but pt refusing to change into dry clothes. Pt then stated he needed to use the restroom. EDT wheeled pt to bathroom.

## 2019-02-02 NOTE — ED Notes (Signed)
Pt woke up and attempted to stand up. Officer was able to redirect pt back into bed.

## 2019-02-02 NOTE — ED Notes (Signed)

## 2019-02-02 NOTE — ED Notes (Signed)
Patient's respirations even and unlabored, He is sleeping, no signs of distress, will continue to monitor.

## 2019-02-02 NOTE — ED Provider Notes (Signed)
[facility] Emergency Department Provider Note   ____________________________________________   First MD Initiated Contact with Patient 02/02/19 0007     (approximate)  I have reviewed the triage vital signs and the nursing notes.   HISTORY  Chief Complaint Psychiatric Evaluation  Level V caveat: Limited by aggression  HPI Omar Davis is a 68 y.o. male who presents to the ED voluntarily with [facility] PD.  Patient is an alcoholic with PTSD who reports having suicidal thoughts without plan.  He has been seen recently in the ED for similar.  Rest of history is unobtainable secondary to patient's aggression.       Past Medical History:  Diagnosis Date  . Chronic pain   . Depressed   . Hypertension   . PTSD (post-traumatic stress disorder)   . TBI (traumatic brain injury) [facility])     Patient Active Problem List   Diagnosis Date Noted  . Alcohol-induced mood disorder ([place]) 01/24/2019  . Alcohol dependence with intoxication, uncomplicated ([place]) [id number]  . Suicidal ideation 09/25/2015  . Alcohol abuse 09/09/2015  . PTSD (post-traumatic stress disorder) 09/09/2015  . Agitation 09/09/2015  . Depressed   . Chronic pain     Past Surgical History:  Procedure Laterality Date  . BACK SURGERY    . HERNIA REPAIR    . KNEE SURGERY    . KNEE SURGERY    . prostectomy N/A     Prior to Admission medications   Medication Sig Start Date End Date Taking? Authorizing Provider  clobetasol cream (TEMOVATE) [id number] % Apply 1 application topically 2 (two) times daily.    [provider]  divalproex (DEPAKOTE ER) 500 MG 24 hr tablet Take 1,000 mg by mouth 2 (two) times daily.     [provider]  DULoxetine (CYMBALTA) 30 MG capsule Take 30 mg by mouth daily.     [provider]  hydrOXYzine (ATARAX/VISTARIL) 10 MG tablet Take 1 tablet (10 mg total) by mouth 3 (three) times daily as needed. 01/16/19   Merlyn Lot, MD   ibuprofen (ADVIL,MOTRIN) 800 MG tablet Take 800 mg by mouth every 8 (eight) hours as needed.    [provider]  lisinopril (PRINIVIL,ZESTRIL) 10 MG tablet Take 10 mg by mouth daily.    [provider]  nystatin ointment (MYCOSTATIN) Apply 1 application topically 2 (two) times daily.    [provider]  pravastatin (PRAVACHOL) 80 MG tablet Take 80 mg by mouth at bedtime.     [provider]  tolnaftate (TINACTIN) 1 % powder Apply 1 application topically 2 (two) times daily.    [provider]    Allergies Codeine, Gabapentin, and Penicillins  No family history on file.  Social History Social History   Tobacco Use  . Smoking status: Never Smoker  . Smokeless tobacco: Never Used  Substance Use Topics  . Alcohol use: Yes    Comment: 1 pint of vodka  . Drug use: No    Review of Systems  Constitutional: No fever/chills Eyes: No visual changes. ENT: No sore throat. Cardiovascular: Denies chest pain. Respiratory: Denies shortness of breath. Gastrointestinal: No abdominal pain.  No nausea, no vomiting.  No diarrhea.  No constipation. Genitourinary: Negative for dysuria. Musculoskeletal: Negative for back pain. Skin: Negative for rash. Neurological: Negative for headaches, focal weakness or numbness. Psychiatric: Positive for SI and aggression.  ____________________________________________   PHYSICAL EXAM:  VITAL SIGNS: ED Triage Vitals  Enc Vitals Group     BP  02/01/19 2223 (!) 197/53     Pulse Rate 02/01/19 2223 (!) 104     Resp 02/01/19 2223 20     Temp 02/01/19 2223 98.7 F (37.1 C)     Temp Source 02/01/19 2223 Oral     SpO2 02/01/19 2223 96 %     Weight 02/01/19 2224 171 lb (77.6 kg)     Height 02/01/19 2224 5\' 8"  (1.727 m)     Head Circumference --      Peak Flow --      Pain Score --      Pain Loc --      Pain Edu? --      Excl. in [place]? --     Constitutional: Alert and oriented. Well appearing and in mild acute  distress. Eyes: Conjunctivae are normal. PERRL. EOMI. Head: Atraumatic. Nose: No congestion/rhinnorhea. Mouth/Throat: Mucous membranes are moist.  Oropharynx non-erythematous. Neck: No stridor.   Cardiovascular: Normal rate, regular rhythm. Grossly normal heart sounds.  Good peripheral circulation. Respiratory: Normal respiratory effort.  No retractions. Lungs CTAB. Gastrointestinal: Soft and nontender. No distention. No abdominal bruits. No CVA tenderness. Musculoskeletal: No lower extremity tenderness nor edema.  No joint effusions. Neurologic:  Normal speech and language. No gross focal neurologic deficits are appreciated. No gait instability. Skin:  Skin is warm, dry and intact. No rash noted. Psychiatric: Intoxicated.  Mood and affect are aggressive. Speech and behavior are normal.  ____________________________________________   LABS (all labs ordered are listed, but only abnormal results are displayed)  Labs Reviewed  COMPREHENSIVE METABOLIC PANEL - Abnormal; Notable for the following components:      Result Value   Chloride 115 (*)    CO2 20 (*)    BUN 27 (*)    AST 61 (*)    Alkaline Phosphatase 132 (*)    All other components within normal limits  ETHANOL - Abnormal; Notable for the following components:   Alcohol, Ethyl (B) 235 (*)    All other components within normal limits  ACETAMINOPHEN LEVEL - Abnormal; Notable for the following components:   Acetaminophen (Tylenol), Serum <10 (*)    All other components within normal limits  CBC - Abnormal; Notable for the following components:   RBC 6.31 (*)    Hemoglobin 12.4 (*)    MCV 65.3 (*)    MCH 19.7 (*)    RDW 23.3 (*)    All other components within normal limits  SALICYLATE LEVEL  URINE DRUG SCREEN, QUALITATIVE ([facility] ONLY)   ____________________________________________  EKG  ED ECG REPORT I, Tayjon Halladay J, the attending physician, personally viewed and interpreted this ECG.   Date: 02/02/2019  EKG Time:  0015  Rate: 100  Rhythm: normal EKG, normal sinus rhythm  Axis: LAD  Intervals:none  ST&T Change: Nonspecific  ____________________________________________  RADIOLOGY  ED MD interpretation: None  Official radiology report(s): No results found.  ____________________________________________   PROCEDURES  Procedure(s) performed (including Critical Care):  Procedures   ____________________________________________   INITIAL IMPRESSION / ASSESSMENT AND PLAN / ED COURSE  As part of my medical decision making, I reviewed the following data within the [place] notes reviewed and incorporated, Labs reviewed, EKG interpreted, Old chart reviewed, Radiograph reviewed, A consult was requested and obtained from this/these consultant(s) Psychiatry and Notes from prior ED visits     Omar Davis was evaluated in Emergency Department on 02/02/2019 for the symptoms described in the history of present illness. He was evaluated in the  context of the global COVID-19 pandemic, which necessitated consideration that the patient might be at risk for infection with the SARS-CoV-2 virus that causes COVID-19. Institutional protocols and algorithms that pertain to the evaluation of patients at risk for COVID-19 are in a state of rapid change based on information released by regulatory bodies including the CDC and federal and state organizations. These policies and algorithms were followed during the patient's care in the ED.    68 year old male with alcohol abuse, PTSD who presents voluntarily for behavioral health evaluation.  Patient currently very verbally aggressive towards staff.  Unable to be verbally redirected.  Requires IVC for his and staff safety.  Requires IM calming agent.   Clinical Course as of Feb 01 618  Sat Feb 02, 2019  0619 Patient received some Ativan and is currently sleeping no acute distress.  Awaiting psychiatric evaluation and disposition this  morning.   [JS]    Clinical Course User Index [JS] Paulette Blanch, MD     ____________________________________________   FINAL CLINICAL IMPRESSION(S) / ED DIAGNOSES  Final diagnoses:  Alcohol abuse  Alcoholic intoxication without complication ([place])  PTSD (post-traumatic stress disorder)     ED Discharge Orders    None       Note:  This document was prepared using Dragon voice recognition software and may include unintentional dictation errors.   Paulette Blanch, MD 02/02/19 [phone]

## 2019-02-02 NOTE — Consult Note (Addendum)
[facility] Psych ED Discharge  02/02/2019 9:34 AM Omar Davis  MRN:  [id number] Principal Problem: Alcohol-induced mood disorder [facility]) Discharge Diagnoses: Principal Problem:   Alcohol-induced mood disorder ([place]) Active Problems:   Alcohol dependence with intoxication, uncomplicated ([place])  Subjective: "I feel good."  Denies suicidal/homicidal ideations, hallucinations, and withdrawal symptoms.  Patient seen and evaluated in person by this provider.  Omar Davis presented under the influence of alcohol with feelings of self harm.  He slept and is not clear and coherent with request to leave.  He is well known to this ED for similar presentations.  Recommended alcohol rehab, patient denied.  Minimizes his alcohol abuse.  No suicidal/homicidal ideations, hallucinations, and withdrawal symptoms.  Stable for discharge.  HPI per MD on admission:  Omar Davis is a 68 y.o. male who presents to the ED voluntarily with [facility] PD.  Patient is an alcoholic with PTSD who reports having suicidal thoughts without plan.  He has been seen recently in the ED for similar.  Rest of history is unobtainable secondary to patient's aggression.  Total Time spent with patient: 1 hour  Past Psychiatric History: depression, alcohol abuse  Past Medical History:  Past Medical History:  Diagnosis Date  . Chronic pain   . Depressed   . Hypertension   . PTSD (post-traumatic stress disorder)   . TBI (traumatic brain injury) [facility])     Past Surgical History:  Procedure Laterality Date  . BACK SURGERY    . HERNIA REPAIR    . KNEE SURGERY    . KNEE SURGERY    . prostectomy N/A    Family History: No family history on file. Family Psychiatric  History: none Social History:  Social History   Substance and Sexual Activity  Alcohol Use Yes   Comment: 1 pint of vodka     Social History   Substance and Sexual Activity  Drug Use No    Social History   Socioeconomic History  . Marital status:  Legally Separated    Spouse name: Not on file  . Number of children: Not on file  . Years of education: Not on file  . Highest education level: Not on file  Occupational History  . Not on file  Social Needs  . Financial resource strain: Not on file  . Food insecurity    Worry: Not on file    Inability: Not on file  . Transportation needs    Medical: Not on file    Non-medical: Not on file  Tobacco Use  . Smoking status: Never Smoker  . Smokeless tobacco: Never Used  Substance and Sexual Activity  . Alcohol use: Yes    Comment: 1 pint of vodka  . Drug use: No  . Sexual activity: Not Currently  Lifestyle  . Physical activity    Days per week: Not on file    Minutes per session: Not on file  . Stress: Not on file  Relationships  . Social Herbalist on phone: Not on file    Gets together: Not on file    Attends religious service: Not on file    Active member of club or organization: Not on file    Attends meetings of clubs or organizations: Not on file    Relationship status: Not on file  Other Topics Concern  . Not on file  Social History Narrative  . Not on file    Has this patient used any form of tobacco in  the last 30 days? (Cigarettes, Smokeless Tobacco, Cigars, and/or Pipes) A prescription for an FDA-approved tobacco cessation medication was offered at discharge and the patient refused  Current Medications: Current Facility-Administered Medications  Medication Dose Route Frequency Provider Last Rate Last Dose  . acetaminophen (TYLENOL) tablet 650 mg  650 mg Oral Once Harvest Dark, MD      . LORazepam (ATIVAN) tablet 0-4 mg  0-4 mg Oral Q6H Paulette Blanch, MD   2 mg at 02/02/19 0534  . thiamine (VITAMIN B-1) tablet 100 mg  100 mg Oral Daily Paulette Blanch, MD   100 mg at 02/02/19 0740   Current Outpatient Medications  Medication Sig Dispense Refill  . divalproex (DEPAKOTE ER) 500 MG 24 hr tablet Take 1,000 mg by mouth 2 (two) times daily.     .  DULoxetine (CYMBALTA) 30 MG capsule Take 30 mg by mouth daily.     . hydrOXYzine (ATARAX/VISTARIL) 10 MG tablet Take 1 tablet (10 mg total) by mouth 3 (three) times daily as needed. 12 tablet 0  . lisinopril (PRINIVIL,ZESTRIL) 10 MG tablet Take 10 mg by mouth daily.    . pravastatin (PRAVACHOL) 80 MG tablet Take 80 mg by mouth at bedtime.     . clobetasol cream (TEMOVATE) [id number] % Apply 1 application topically 2 (two) times daily.    Marland Kitchen ibuprofen (ADVIL,MOTRIN) 800 MG tablet Take 800 mg by mouth every 8 (eight) hours as needed.    . nystatin ointment (MYCOSTATIN) Apply 1 application topically 2 (two) times daily.    Marland Kitchen tolnaftate (TINACTIN) 1 % powder Apply 1 application topically 2 (two) times daily.     PTA Medications: (Not in a hospital admission)   Musculoskeletal: Strength & Muscle Tone: within normal limits Gait & Station: normal Patient leans: N/A  Psychiatric Specialty Exam: Physical Exam  Nursing note and vitals reviewed. Constitutional: He is oriented to person, place, and time. He appears well-developed and well-nourished.  HENT:  Head: Normocephalic.  Neck: Normal range of motion.  Respiratory: Effort normal.  Musculoskeletal: Normal range of motion.  Neurological: He is alert and oriented to person, place, and time.  Psychiatric: His speech is normal and behavior is normal. Judgment and thought content normal. His mood appears anxious. Cognition and memory are normal.    Review of Systems  Psychiatric/Behavioral: Positive for substance abuse. The patient is nervous/anxious.   All other systems reviewed and are negative.   Blood pressure (!) 149/96, pulse 98, temperature 98.6 F (37 C), temperature source Oral, resp. rate 16, height 5\' 8"  (1.727 m), weight 77.6 kg, SpO2 98 %.Body mass index is 26 kg/m.  General Appearance: Casual  Eye Contact:  Good  Speech:  Normal Rate  Volume:  Normal  Mood:  Anxious  Affect:  Congruent  Thought Process:  Coherent and  Descriptions of Associations: Intact  Orientation:  Full (Time, Place, and Person)  Thought Content:  WDL and Logical  Suicidal Thoughts:  No  Homicidal Thoughts:  No  Memory:  Immediate;   Fair Recent;   Fair Remote;   Fair  Judgement:  Fair  Insight:  Fair  Psychomotor Activity:  Normal  Concentration:  Concentration: Fair and Attention Span: Fair  Recall:  [employer] of Knowledge:  Fair  Language:  Good  Akathisia:  No  Handed:  Right  AIMS (if indicated):     Assets:  Leisure Time Physical Health Resilience Social Support  ADL's:  Intact  Cognition:  WNL  Sleep:  Demographic Factors:  Male, Caucasian and Living alone  Loss Factors: NA  Historical Factors: NA  Risk Reduction Factors:   Sense of responsibility to family, Positive social support and Positive therapeutic relationship  Continued Clinical Symptoms:  Anxiety, mild  Cognitive Features That Contribute To Risk:  None    Suicide Risk:  Minimal: No identifiable suicidal ideation.  Patients presenting with no risk factors but with morbid ruminations; may be classified as minimal risk based on the severity of the depressive symptoms  Follow-up Information    Schedule an appointment as soon as possible for a visit  with [place] information: [place] [address] [phone]        Schedule an appointment as soon as possible for a visit  with [place]   Contact information: [place] [address] [phone]           Plan Of Care/Follow-up recommendations:  Alcohol induced mood disorder: -Ativan alcohol detox protocol -Follow up with [facility] Activity:  as tolerated Diet:  heart healthy diet  Disposition: discharge home Waylan Boga, NP 02/02/2019, 9:34 AM   Case discussed with Dr. Reita Cliche.  Agree with plan as outlined above.

## 2019-02-02 NOTE — ED Notes (Signed)
Reports to [facility] Dr Florestine Avers att

## 2019-02-02 NOTE — Discharge Instructions (Signed)
[facility]  [address] [place], [place] [address]  ~15.5 mi [phone] Closed  Opens Mon 8 AM Alcohol Abuse and Dependence Information, Adult Alcohol is a widely available drug. People drink alcohol in different amounts. People who drink alcohol very often and in large amounts often have problems during and after drinking. They may develop what is called an alcohol use disorder. There are two main types of alcohol use disorders:  Alcohol abuse. This is when you use alcohol too much or too often. You may use alcohol to make yourself feel happy or to reduce stress. You may have a hard time setting a limit on the amount you drink.  Alcohol dependence. This is when you use alcohol consistently for a period of time, and your body changes as a result. This can make it hard to stop drinking because you may start to feel sick or feel different when you do not use alcohol. These symptoms are known as withdrawal. How can alcohol abuse and dependence affect me? Alcohol abuse and dependence can have a negative effect on your life. Drinking too much can lead to addiction. You may feel like you need alcohol to function normally. You may drink alcohol before work in the morning, during the day, or as soon as you get home from work in the evening. These actions can result in:  Poor work performance.  Job loss.  Financial problems.  Car crashes or criminal charges from driving after drinking alcohol.  Problems in your relationships with friends and family.  Losing the trust and respect of coworkers, friends, and family. Drinking heavily over a long period of time can permanently damage your body and brain, and can cause lifelong health issues, such as:  Damage to your liver or pancreas.  Heart problems, high blood pressure, or stroke.  Certain cancers.  Decreased ability to fight infections.  Brain or nerve damage.  Depression.  Early  (premature) death. If you are careless or you crave alcohol, it is easy to drink more than your body can handle (overdose). Alcohol overdose is a serious situation that requires hospitalization. It may lead to permanent injuries or death. What can increase my risk?  Having a family history of alcohol abuse.  Having depression or other mental health conditions.  Beginning to drink at an early age.  Binge drinking often.  Experiencing trauma, stress, and an unstable home life during childhood.  Spending time with people who drink often. What actions can I take to prevent or manage alcohol abuse and dependence?  Do not drink alcohol if: ? Your health care provider tells you not to drink. ? You are pregnant, may be pregnant, or are planning to become pregnant.  If you drink alcohol: ? Limit how much you use to:  0-1 drink a day for women.  0-2 drinks a day for men. ? Be aware of how much alcohol is in your drink. In the [place] one drink equals one 12 oz bottle of beer (355 mL), one 5 oz glass of wine (148 mL), or one 1 oz glass of hard liquor (44 mL).  Stop drinking if you have been drinking too much. This can be very hard to do if you are used to abusing alcohol. If you begin to have withdrawal symptoms, talk with your health care provider or a person that you trust. These symptoms may include anxiety, shaky hands, headache, nausea, sweating, or not being able to  sleep.  Choose to drink nonalcoholic beverages in social gatherings and places where there may be alcohol. Activity  Spend more time on activities that you enjoy that do not involve alcohol, like hobbies or exercise.  Find healthy ways to cope with stress, such as exercise, meditation, or spending time with people you care about. General information  Talk to your family, coworkers, and friends about supporting you in your efforts to stop drinking. If they drink, ask them not to drink around you. Spend more time with  people who do not drink alcohol.  If you think that you have an alcohol dependency problem: ? Tell friends or family about your concerns. ? Talk with your health care provider or another health professional about where to get help. ? Work with a Transport planner and a Regulatory affairs officer. ? Consider joining a support group for people who struggle with alcohol abuse and dependence. Where to find support   Your health care provider.  SMART Recovery: www.smartrecovery.org Therapy and support groups  Local treatment centers or chemical dependency counselors.  Local AA groups in your community: NicTax.com.pt Where to find more information  Centers for Disease Control and Prevention: http://www.wolf.info/  National Institute on Alcohol Abuse and Alcoholism: http://www.bradshaw.com/  Alcoholics Anonymous (AA): NicTax.com.pt Contact a health care provider if:  You drank more or for longer than you intended on more than one occasion.  You tried to stop drinking or to cut back on how much you drink, but you were not able to.  You often drink to the point of vomiting or passing out.  You want to drink so badly that you cannot think about anything else.  You have problems in your life due to drinking, but you continue to drink.  You keep drinking even though you feel anxious, depressed, or have experienced memory loss.  You have stopped doing the things you used to enjoy in order to drink.  You have to drink more than you used to in order to get the effect you want.  You experience anxiety, sweating, nausea, shakiness, and trouble sleeping when you try to stop drinking. Get help right away if:  You have thoughts about hurting yourself or others.  You have serious withdrawal symptoms, including: ? Confusion. ? Racing heart. ? High blood pressure. ? Fever. If you ever feel like you may hurt yourself or others, or have thoughts about taking your own life, get help right away. You can go to your  nearest emergency department or call:  Your local emergency services (911 in the [place]  A suicide crisis helpline, such as the [place] at (406) 259-2450. This is open 24 hours a day. Summary  Alcohol abuse and dependence can have a negative effect on your life. Drinking too much or too often can lead to addiction.  If you drink alcohol, limit how much you use.  If you are having trouble keeping your drinking under control, find ways to change your behavior. Hobbies, calming activities, exercise, or support groups can help.  If you feel you need help with changing your drinking habits, talk with your health care provider, a good friend, or a therapist, or go to an [place] group. This information is not intended to replace advice given to you by your health care provider. Make sure you discuss any questions you have with your health care provider. Document Released: 05/03/2016 Document Revised: 08/28/2018 Document Reviewed: 07/17/2018 Elsevier Patient Education  2020 [employer].

## 2019-02-02 NOTE — ED Provider Notes (Signed)
-----------------------------------------   9:23 AM on 02/02/2019 -----------------------------------------  Patient has been seen and evaluated by psychiatry.  They believe the patient is safe for discharge home.  We will provide RTS lethargic follow-up for the patient.  Medical work-up is been nonrevealing besides alcohol intoxication   Harvest Dark, MD 02/02/19 [phone]

## 2019-02-02 NOTE — ED Notes (Signed)
Patient still complains of pain in shoulders and upper body, but states that pain is a little better, Nurse will continue to monitor.

## 2019-02-02 NOTE — ED Notes (Signed)
Pt ambulatory to toilet without assistance 

## 2019-02-02 NOTE — Progress Notes (Signed)
Patient is intoxicated, will evaluate when clear and coherent.  Waylan Boga, PMHNP

## 2019-02-02 NOTE — ED Notes (Addendum)
Pt requesting to leave; oriented to plan to see psychiatry in am after breakfast, pt reports daily drinking to stop the "stabbing pain all over" after surgery and blood transfusion after TBI  Pt denies SI att, reports "that's something I say when I get to drinking", pt contracting for safety att

## 2019-02-02 NOTE — ED Notes (Signed)
Pt. Alert and oriented, warm and dry, in no distress. Pt. Denies SI, HI, and AVH. Pt states he is here for drinking. Patient states she just wants to go home. Pt. Encouraged to let nursing staff know of any concerns or needs.

## 2019-02-02 NOTE — ED Notes (Addendum)
Pt to interview room att, security notified  Pt finished meal tray and fluids

## 2019-02-02 NOTE — ED Notes (Signed)
Patient alert and oriented, states that He wants to leave, and he is going to call a taxi to pick him up to go to the [facility], Patient voices understanding of discharge, and He states ' I know I need to stop drinking, I did stop doing drugs and if I can stop alcohol I would be better, Patient was told about rehabs and AA meetings, all of His belongings given back to him, He left without any complaints.

## 2019-02-02 NOTE — ED Notes (Addendum)
Pt nonverbal greeting to this RN when spoken to; pt declined blanket and nutrition; pt lying in hall bed; will continue to monitor

## 2019-02-02 NOTE — ED Triage Notes (Signed)
Pt d/c from [facility] early today. Pt was found at [facility] with 2 bottles of empty vodka. EMS was requested by law enforcement. Pt is very hostile. Pt repeatelty states, "fuck you, don't touch me." Pt appears highly intoxicated. Pt continuous yelling and verbally abusing staff.

## 2019-02-02 NOTE — ED Notes (Signed)
Patient is combative, trying to get up and leave, cursing and yelling at staff, states " no one cares" [facility] and another officer helped hold him from harming himself or others, Doctor ordered Geodon IM for restraint , Nurse Google administered.

## 2019-02-02 NOTE — ED Notes (Signed)
Security at bedside, having to redirect pt. Pt is not easily directed at this time. Does not respond well to females talking to him. Will cooperate and follow when male officers redirect him. Remains attempting to get out of bed.

## 2019-02-03 ENCOUNTER — Other Ambulatory Visit: Payer: Self-pay

## 2019-02-03 ENCOUNTER — Emergency Department
Admission: EM | Admit: 2019-02-03 | Discharge: 2019-02-05 | Disposition: A | Discharge status: Other Acute Inpatient Hospital | Payer: No Typology Code available for payment source | Attending: Student in an Organized Health Care Education/Training Program | Admitting: Student in an Organized Health Care Education/Training Program

## 2019-02-03 DIAGNOSIS — Z20828 Contact with and (suspected) exposure to other viral communicable diseases: Secondary | ICD-10-CM | POA: Diagnosis not present

## 2019-02-03 DIAGNOSIS — Y908 Blood alcohol level of 240 mg/100 ml or more: Secondary | ICD-10-CM | POA: Insufficient documentation

## 2019-02-03 DIAGNOSIS — I10 Essential (primary) hypertension: Secondary | ICD-10-CM | POA: Diagnosis not present

## 2019-02-03 DIAGNOSIS — F10121 Alcohol abuse with intoxication delirium: Secondary | ICD-10-CM | POA: Diagnosis present

## 2019-02-03 DIAGNOSIS — F329 Major depressive disorder, single episode, unspecified: Secondary | ICD-10-CM | POA: Diagnosis not present

## 2019-02-03 DIAGNOSIS — F1014 Alcohol abuse with alcohol-induced mood disorder: Secondary | ICD-10-CM

## 2019-02-03 DIAGNOSIS — F10921 Alcohol use, unspecified with intoxication delirium: Secondary | ICD-10-CM

## 2019-02-03 DIAGNOSIS — F10150 Alcohol abuse with alcohol-induced psychotic disorder with delusions: Secondary | ICD-10-CM

## 2019-02-03 DIAGNOSIS — F1010 Alcohol abuse, uncomplicated: Secondary | ICD-10-CM

## 2019-02-03 DIAGNOSIS — Z79899 Other long term (current) drug therapy: Secondary | ICD-10-CM | POA: Diagnosis not present

## 2019-02-03 DIAGNOSIS — R45851 Suicidal ideations: Secondary | ICD-10-CM

## 2019-02-03 DIAGNOSIS — F10220 Alcohol dependence with intoxication, uncomplicated: Secondary | ICD-10-CM | POA: Diagnosis present

## 2019-02-03 DIAGNOSIS — F332 Major depressive disorder, recurrent severe without psychotic features: Secondary | ICD-10-CM | POA: Diagnosis present

## 2019-02-03 DIAGNOSIS — F1020 Alcohol dependence, uncomplicated: Secondary | ICD-10-CM | POA: Diagnosis present

## 2019-02-03 LAB — URINE DRUG SCREEN, QUALITATIVE (ARMC ONLY)
Amphetamines, Ur Screen: NOT DETECTED
Barbiturates, Ur Screen: NOT DETECTED
Benzodiazepine, Ur Scrn: NOT DETECTED
Cannabinoid 50 Ng, Ur ~~LOC~~: NOT DETECTED
Cocaine Metabolite,Ur ~~LOC~~: NOT DETECTED
MDMA (Ecstasy)Ur Screen: NOT DETECTED
Methadone Scn, Ur: NOT DETECTED
Opiate, Ur Screen: NOT DETECTED
Phencyclidine (PCP) Ur S: NOT DETECTED
Tricyclic, Ur Screen: NOT DETECTED

## 2019-02-03 LAB — CBC WITH DIFFERENTIAL/PLATELET
Abs Immature Granulocytes: 0.08 10*3/uL — ABNORMAL HIGH (ref 0.00–0.07)
Basophils Absolute: 0.1 10*3/uL (ref 0.0–0.1)
Basophils Relative: 1 %
Eosinophils Absolute: 0.4 10*3/uL (ref 0.0–0.5)
Eosinophils Relative: 4 %
HCT: 42.9 % (ref 39.0–52.0)
Hemoglobin: 12.4 g/dL — ABNORMAL LOW (ref 13.0–17.0)
Immature Granulocytes: 1 %
Lymphocytes Relative: 15 %
Lymphs Abs: 1.4 10*3/uL (ref 0.7–4.0)
MCH: 19.2 pg — ABNORMAL LOW (ref 26.0–34.0)
MCHC: 28.9 g/dL — ABNORMAL LOW (ref 30.0–36.0)
MCV: 66.5 fL — ABNORMAL LOW (ref 80.0–100.0)
Monocytes Absolute: 0.9 10*3/uL (ref 0.1–1.0)
Monocytes Relative: 9 %
Neutro Abs: 6.9 10*3/uL (ref 1.7–7.7)
Neutrophils Relative %: 70 %
Platelets: 333 10*3/uL (ref 150–400)
RBC: 6.45 MIL/uL — ABNORMAL HIGH (ref 4.22–5.81)
RDW: 23.2 % — ABNORMAL HIGH (ref 11.5–15.5)
Smear Review: NORMAL
WBC: 9.9 10*3/uL (ref 4.0–10.5)
nRBC: 0 % (ref 0.0–0.2)

## 2019-02-03 LAB — COMPREHENSIVE METABOLIC PANEL
ALT: 36 U/L (ref 0–44)
AST: 52 U/L — ABNORMAL HIGH (ref 15–41)
Albumin: 4.1 g/dL (ref 3.5–5.0)
Alkaline Phosphatase: 131 U/L — ABNORMAL HIGH (ref 38–126)
Anion gap: 13 (ref 5–15)
BUN: 22 mg/dL (ref 8–23)
CO2: 19 mmol/L — ABNORMAL LOW (ref 22–32)
Calcium: 9.1 mg/dL (ref 8.9–10.3)
Chloride: 108 mmol/L (ref 98–111)
Creatinine, Ser: 1.14 mg/dL (ref 0.61–1.24)
GFR calc Af Amer: >60 mL/min (ref >60)
GFR calc non Af Amer: >60 mL/min (ref >60)
Glucose, Bld: 194 mg/dL — ABNORMAL HIGH (ref 70–99)
Potassium: 3.5 mmol/L (ref 3.5–5.1)
Sodium: 140 mmol/L (ref 135–145)
Total Bilirubin: 0.7 mg/dL (ref 0.3–1.2)
Total Protein: 7.2 g/dL (ref 6.5–8.1)

## 2019-02-03 LAB — SARS CORONAVIRUS 2 BY RT PCR (HOSPITAL ORDER, PERFORMED IN ~~LOC~~ HOSPITAL LAB): SARS Coronavirus 2: NEGATIVE

## 2019-02-03 LAB — ETHANOL: Alcohol, Ethyl (B): 281 mg/dL — ABNORMAL HIGH (ref <10)

## 2019-02-03 LAB — ACETAMINOPHEN LEVEL: Acetaminophen (Tylenol), Serum: <10 ug/mL — ABNORMAL LOW (ref 10–30)

## 2019-02-03 MED ORDER — LORAZEPAM 1 MG PO TABS: 1.0000 mg | ORAL_TABLET | Freq: Every day | ORAL | Status: DC

## 2019-02-03 MED ORDER — LORAZEPAM 1 MG PO TABS
1.0000 mg | ORAL_TABLET | Freq: Four times a day (QID) | ORAL | Status: AC
  Administered 2019-02-04 (×3): 1 mg via ORAL
  Filled 2019-02-03 (×3): qty 1

## 2019-02-03 MED ORDER — LORAZEPAM 1 MG PO TABS
1.0000 mg | ORAL_TABLET | Freq: Four times a day (QID) | ORAL | Status: DC | PRN
  Administered 2019-02-04: 1 mg via ORAL
  Filled 2019-02-03: qty 1

## 2019-02-03 MED ORDER — THIAMINE HCL 100 MG/ML IJ SOLN
100.0000 mg | Freq: Once | INTRAMUSCULAR | Status: AC
  Administered 2019-02-03: 17:00:00 100 mg via INTRAMUSCULAR
  Filled 2019-02-03: qty 2

## 2019-02-03 MED ORDER — ADULT MULTIVITAMIN W/MINERALS CH
1.0000 | ORAL_TABLET | Freq: Every day | ORAL | Status: DC
  Administered 2019-02-04 – 2019-02-05 (×2): 1 via ORAL
  Filled 2019-02-03 (×2): qty 1

## 2019-02-03 MED ORDER — VITAMIN B-1 100 MG PO TABS
100.0000 mg | ORAL_TABLET | Freq: Every day | ORAL | Status: DC
  Administered 2019-02-04 – 2019-02-05 (×2): 100 mg via ORAL
  Filled 2019-02-03 (×2): qty 1

## 2019-02-03 MED ORDER — ZIPRASIDONE MESYLATE 20 MG IM SOLR
20.0000 mg | Freq: Once | INTRAMUSCULAR | Status: AC
  Administered 2019-02-03: 20 mg via INTRAMUSCULAR
  Filled 2019-02-03: qty 20

## 2019-02-03 MED ORDER — LORAZEPAM 1 MG PO TABS: 1.0000 mg | ORAL_TABLET | Freq: Two times a day (BID) | ORAL | Status: DC

## 2019-02-03 MED ORDER — ONDANSETRON 4 MG PO TBDP: 4.0000 mg | ORAL_TABLET | Freq: Four times a day (QID) | ORAL | Status: DC | PRN

## 2019-02-03 MED ORDER — HYDROXYZINE HCL 25 MG PO TABS
25.0000 mg | ORAL_TABLET | Freq: Four times a day (QID) | ORAL | Status: DC | PRN
  Administered 2019-02-04 (×3): 25 mg via ORAL
  Filled 2019-02-03 (×3): qty 1

## 2019-02-03 MED ORDER — NAPROXEN 500 MG PO TABS
500.0000 mg | ORAL_TABLET | Freq: Once | ORAL | Status: AC
  Administered 2019-02-03: 500 mg via ORAL
  Filled 2019-02-03: qty 1

## 2019-02-03 MED ORDER — LOPERAMIDE HCL 2 MG PO CAPS: 2.0000 mg | ORAL_CAPSULE | ORAL | Status: DC | PRN

## 2019-02-03 MED ORDER — LORAZEPAM 1 MG PO TABS
1.0000 mg | ORAL_TABLET | Freq: Three times a day (TID) | ORAL | Status: DC
  Administered 2019-02-05: 1 mg via ORAL
  Filled 2019-02-03: qty 1

## 2019-02-03 NOTE — ED Triage Notes (Signed)
Pt comes via officers from [place] [facility] after drinking and taking unknown pills. Pt was walking outside on sidewalks and bystanders called to keep him from falling. Officers have not IVCd him at this time. Pt was d/c from here this am.

## 2019-02-03 NOTE — ED Notes (Signed)
Pt's belongings returned. Pt ambulated with this RN to ED [facility] entrance without difficulty. Pt continues to deny headache and other s/s of alcohol detox.

## 2019-02-03 NOTE — ED Notes (Signed)
Pt continues to say things like "i'm going to end it all" and "this is the last time i'll talk to my girlfriend"

## 2019-02-03 NOTE — ED Notes (Signed)
Pt dressed out with this RN and Nicole,NT. Belongings include jeans, belt, tshirt, socks, tennis shoes. Placed in belongings bag.

## 2019-02-03 NOTE — ED Notes (Signed)
Pt urinated all over self and bed. Pt changed and new linens applied with this RN and Edison Nasuti, NT. Pt throwing around slurs such as "you bitch" and "what's wrong with me" and officer at bedside to facilitate. Pt now resting in NAD in bed.

## 2019-02-03 NOTE — ED Provider Notes (Signed)
[facility] Emergency Department Provider Note    First MD Initiated Contact with Patient 02/03/19 1422     (approximate)  I have reviewed the triage vital signs and the nursing notes.   HISTORY  Chief Complaint Alcohol Intoxication  Level V Caveat:  intoxication  HPI Omar Davis is a 68 y.o. male plosive past medical history returns the ER after recently being discharged.  Patient was found at hotel room with several empty bottles of alcohol that were consumed after being discharged from this facility.  Reportedly had on known pills strain about the room as well.  Police were called to the patient being intoxicated and yelling loudly.  Patient screaming "God damn it I want to die."    Past Medical History:  Diagnosis Date  . Chronic pain   . Depressed   . Hypertension   . PTSD (post-traumatic stress disorder)   . TBI (traumatic brain injury) [facility])    History reviewed. No pertinent family history. Past Surgical History:  Procedure Laterality Date  . BACK SURGERY    . HERNIA REPAIR    . KNEE SURGERY    . KNEE SURGERY    . prostectomy N/A    Patient Active Problem List   Diagnosis Date Noted  . Alcohol-induced mood disorder ([place]) 01/24/2019  . Alcohol dependence with intoxication, uncomplicated ([place]) [id number]  . Major depressive disorder, recurrent severe without psychotic features ([place]) 08/24/2018  . Suicidal ideation 09/25/2015  . Alcohol abuse 09/09/2015  . PTSD (post-traumatic stress disorder) 09/09/2015  . Agitation 09/09/2015  . Depressed   . Chronic pain       Prior to Admission medications   Medication Sig Start Date End Date Taking? Authorizing Provider  clobetasol cream (TEMOVATE) [id number] % Apply 1 application topically 2 (two) times daily.   Yes [provider]  divalproex (DEPAKOTE ER) 500 MG 24 hr tablet Take 1,000 mg by mouth 2 (two) times daily.    Yes [provider]  DULoxetine (CYMBALTA) 30 MG  capsule Take 30 mg by mouth daily.    Yes [provider]  hydrOXYzine (ATARAX/VISTARIL) 10 MG tablet Take 1 tablet (10 mg total) by mouth 3 (three) times daily as needed. 01/16/19  Yes Merlyn Lot, MD  ibuprofen (ADVIL,MOTRIN) 800 MG tablet Take 800 mg by mouth every 8 (eight) hours as needed.   Yes [provider]  lisinopril (PRINIVIL,ZESTRIL) 10 MG tablet Take 10 mg by mouth daily.   Yes [provider]  nystatin ointment (MYCOSTATIN) Apply 1 application topically 2 (two) times daily.   Yes [provider]  pravastatin (PRAVACHOL) 80 MG tablet Take 80 mg by mouth at bedtime.    Yes [provider]  tolnaftate (TINACTIN) 1 % powder Apply 1 application topically 2 (two) times daily.   Yes [provider]    Allergies Codeine, Gabapentin, and Penicillins    Social History Social History   Tobacco Use  . Smoking status: Never Smoker  . Smokeless tobacco: Never Used  Substance Use Topics  . Alcohol use: Yes    Comment: 1 pint of vodka  . Drug use: No    Review of Systems Patient denies headaches, rhinorrhea, blurry vision, numbness, shortness of breath, chest pain, edema, cough, abdominal pain, nausea, vomiting, diarrhea, dysuria, fevers, rashes or hallucinations unless otherwise stated above in HPI. ____________________________________________   PHYSICAL EXAM:  VITAL SIGNS: Vitals:   02/03/19 1449  BP: 131/85  Pulse: (!) 113  Resp: 17  Temp: 98.3 F (36.8 C)  SpO2: 97%    Constitutional: Alert but heavily intoxicated appearing agitated being uncooperative with staff screaming he wants to die and threatening security Eyes: Conjunctivae are normal.  Head: Atraumatic. Nose: No congestion/rhinnorhea. Mouth/Throat: Mucous membranes are moist.   Neck: No stridor. Painless ROM.  Cardiovascular:  Good peripheral circulation. Respiratory: Normal respiratory effort.  No retractions. Lungs CTAB. Gastrointestinal: Soft  and nontender. No distention. No abdominal bruits. No CVA tenderness. Genitourinary:  Musculoskeletal: No lower extremity tenderness nor edema.  No joint effusions. Neurologic: Slurred speech and language.  Unsteady gait,   skin:  Skin is warm, dry and intact. No rash noted. Psychiatric intoxicated and becoming agitated and belligerent with staff members  ____________________________________________   LABS (all labs ordered are listed, but only abnormal results are displayed)  Results for orders placed or performed during the hospital encounter of 02/03/19 (from the past 24 hour(s))  CBC with Differential/Platelet     Status: Abnormal (Preliminary result)   Collection Time: 02/03/19  2:50 PM  Result Value Ref Range   WBC 9.9 4.0 - 10.5 K/uL   RBC 6.45 (H) 4.22 - 5.81 MIL/uL   Hemoglobin 12.4 (L) 13.0 - 17.0 g/dL   HCT 42.9 39.0 - 52.0 %   MCV 66.5 (L) 80.0 - 100.0 fL   MCH 19.2 (L) 26.0 - 34.0 pg   MCHC 28.9 (L) 30.0 - 36.0 g/dL   RDW 23.2 (H) 11.5 - 15.5 %   Platelets 333 150 - 400 K/uL   nRBC 0.0 0.0 - 0.2 %   Neutrophils Relative % PENDING %   Neutro Abs PENDING 1.7 - 7.7 K/uL   Band Neutrophils PENDING %   Lymphocytes Relative PENDING %   Lymphs Abs PENDING 0.7 - 4.0 K/uL   Monocytes Relative PENDING %   Monocytes Absolute PENDING 0.1 - 1.0 K/uL   Eosinophils Relative PENDING %   Eosinophils Absolute PENDING 0.0 - 0.5 K/uL   Basophils Relative PENDING %   Basophils Absolute PENDING 0.0 - 0.1 K/uL   WBC Morphology PENDING    RBC Morphology PENDING    Smear Review PENDING    Other PENDING %   nRBC PENDING 0 /100 WBC   Metamyelocytes Relative PENDING %   Myelocytes PENDING %   Promyelocytes Relative PENDING %   Blasts PENDING %  Comprehensive metabolic panel     Status: Abnormal   Collection Time: 02/03/19  2:50 PM  Result Value Ref Range   Sodium 140 135 - 145 mmol/L   Potassium 3.5 3.5 - 5.1 mmol/L   Chloride 108 98 - 111 mmol/L   CO2 19 (L) 22 - 32 mmol/L    Glucose, Bld 194 (H) 70 - 99 mg/dL   BUN 22 8 - 23 mg/dL   Creatinine, Ser 1.14 0.61 - 1.24 mg/dL   Calcium 9.1 8.9 - 10.3 mg/dL   Total Protein 7.2 6.5 - 8.1 g/dL   Albumin 4.1 3.5 - 5.0 g/dL   AST 52 (H) 15 - 41 U/L   ALT 36 0 - 44 U/L   Alkaline Phosphatase 131 (H) 38 - 126 U/L   Total Bilirubin 0.7 0.3 - 1.2 mg/dL   GFR calc non Af Amer >60 >60 mL/min   GFR calc Af Amer >60 >60 mL/min   Anion gap 13 5 - 15   ____________________________________________  EKG My review and personal interpretation at Time: 14:47   Indication: intoxication  Rate: 115  Rhythm: sinus Axis: normal  Other: normal intervals, no stemi ____________________________________________  RADIOLOGY   ____________________________________________   PROCEDURES  Procedure(s) performed:  .Critical Care Performed by: Merlyn Lot, MD Authorized by: Merlyn Lot, MD   Critical care provider statement:    Critical care time (minutes):  30   Critical care time was exclusive of:  Separately billable procedures and treating other patients   Critical care was necessary to treat or prevent imminent or life-threatening deterioration of the following conditions:  [place]   Critical care was time spent personally by me on the following activities:  Development of treatment plan with patient or surrogate, discussions with consultants, evaluation of patient's response to treatment, examination of patient, obtaining history from patient or surrogate, ordering and performing treatments and interventions, ordering and review of laboratory studies, ordering and review of radiographic studies, pulse oximetry, re-evaluation of patient's condition and review of old charts      Critical Care performed: yes ____________________________________________   INITIAL IMPRESSION / [place] / ED COURSE  Pertinent labs & imaging results that were available during my care of the patient were reviewed by me and  considered in my medical decision making (see chart for details).   DDX: Psychosis, delirium, medication effect, noncompliance, polysubstance abuse, Si, Hi, depression   Omar Davis is a 68 y.o. who presents to the ED with acute agitation in the setting of known psychiatric as well as alcohol dependency disorder.  Patient very uncooperative belligerent and agitated.  For his safety and that of staff members will give IM Geodon and monitor closely.  Blood work yesterday was otherwise reassuring.  We will repeat this.  There is no report of any trauma.  At this point patient clearly unable to be safely discharged.  He will be IVC.  Have consulted psychiatry.     The patient was evaluated in Emergency Department today for the symptoms described in the history of present illness. He/she was evaluated in the context of the global COVID-19 pandemic, which necessitated consideration that the patient might be at risk for infection with the SARS-CoV-2 virus that causes COVID-19. Institutional protocols and algorithms that pertain to the evaluation of patients at risk for COVID-19 are in a state of rapid change based on information released by regulatory bodies including the CDC and federal and state organizations. These policies and algorithms were followed during the patient's care in the ED.  As part of my medical decision making, I reviewed the following data within the [place] notes reviewed and incorporated, Labs reviewed, notes from prior ED visits and [place] Controlled Substance Database   ____________________________________________   FINAL CLINICAL IMPRESSION(S) / ED DIAGNOSES  Final diagnoses:  Alcohol abuse  Alcohol intoxication with delirium ([place])      NEW MEDICATIONS STARTED DURING THIS VISIT:  New Prescriptions   No medications on file     Note:  This document was prepared using Dragon voice recognition software and may include unintentional  dictation errors.    Merlyn Lot, MD 02/03/19 [phone]

## 2019-02-03 NOTE — ED Notes (Signed)
IVC/Consult completed/Rec. Inpt Admit °

## 2019-02-03 NOTE — ED Notes (Signed)
Pt unhooked from monitor cleared by Dr. Corky Downs.

## 2019-02-03 NOTE — ED Notes (Signed)
Report received on pt. Care assumed.  

## 2019-02-03 NOTE — Consult Note (Addendum)
[facility] Psych ED Discharge  02/03/2019 9:23 AM Omar Davis  MRN:  [id number] Principal Problem: Alcohol dependence with intoxication, uncomplicated [facility]) Discharge Diagnoses: Principal Problem:   Alcohol dependence with intoxication, uncomplicated ([place])  Subjective: "I'd like to go home."  Denies suicidal/homicidal ideations, hallucinations, and withdrawal symptoms.  Patient seen and evaluated in person by this provider.  Omar Davis presented under the influence of alcohol with feelings of self harm.  He slept and is clear and coherent with request to leave.  He is well known to this ED for similar presentations.  Recommended alcohol rehab, patient denied.  Minimizes his alcohol abuse.  Discussed him leaving yesterday and returning within hours intoxicated and belligerent.  Patient states he is going to stop.  Recommended AA but he states he did it on his own last time for six months and plans to again.  No suicidal/homicidal ideations, hallucinations, and withdrawal symptoms.  Stable for discharge.  HPI per MD on admission:  Omar Davis is a 68 y.o. male who presents to the ED voluntarily with [facility].  Patient is an alcoholic with PTSD who reports having suicidal thoughts without plan.  He has been seen recently in the ED for similar.  Rest of history is unobtainable secondary to patient's aggression.  Total Time spent with patient: 1 hour  Past Psychiatric History: depression, alcohol abuse  Past Medical History:  Past Medical History:  Diagnosis Date  . Chronic pain   . Depressed   . Hypertension   . PTSD (post-traumatic stress disorder)   . TBI (traumatic brain injury) [facility])     Past Surgical History:  Procedure Laterality Date  . BACK SURGERY    . HERNIA REPAIR    . KNEE SURGERY    . KNEE SURGERY    . prostectomy N/A    Family History: History reviewed. No pertinent family history. Family Psychiatric  History: none Social History:  Social History    Substance and Sexual Activity  Alcohol Use Yes   Comment: 1 pint of vodka     Social History   Substance and Sexual Activity  Drug Use No    Social History   Socioeconomic History  . Marital status: Legally Separated    Spouse name: Not on file  . Number of children: Not on file  . Years of education: Not on file  . Highest education level: Not on file  Occupational History  . Not on file  Social Needs  . Financial resource strain: Not on file  . Food insecurity    Worry: Not on file    Inability: Not on file  . Transportation needs    Medical: Not on file    Non-medical: Not on file  Tobacco Use  . Smoking status: Never Smoker  . Smokeless tobacco: Never Used  Substance and Sexual Activity  . Alcohol use: Yes    Comment: 1 pint of vodka  . Drug use: No  . Sexual activity: Not Currently  Lifestyle  . Physical activity    Days per week: Not on file    Minutes per session: Not on file  . Stress: Not on file  Relationships  . Social Herbalist on phone: Not on file    Gets together: Not on file    Attends religious service: Not on file    Active member of club or organization: Not on file    Attends meetings of clubs or organizations: Not on file  Relationship status: Not on file  Other Topics Concern  . Not on file  Social History Narrative  . Not on file    Has this patient used any form of tobacco in the last 30 days? (Cigarettes, Smokeless Tobacco, Cigars, and/or Pipes) A prescription for an FDA-approved tobacco cessation medication was offered at discharge and the patient refused  Current Medications: Current Facility-Administered Medications  Medication Dose Route Frequency Provider Last Rate Last Dose  . LORazepam (ATIVAN) injection 0-4 mg  0-4 mg Intravenous Q6H Duffy Bruce, MD       Or  . LORazepam (ATIVAN) tablet 0-4 mg  0-4 mg Oral Q6H Duffy Bruce, MD   2 mg at 02/03/19 [id number]  . [START ON 02/05/2019] LORazepam (ATIVAN)  injection 0-4 mg  0-4 mg Intravenous Q12H Duffy Bruce, MD       Or  . Derrill Memo ON 02/05/2019] LORazepam (ATIVAN) tablet 0-4 mg  0-4 mg Oral Q12H Duffy Bruce, MD      . thiamine (VITAMIN B-1) tablet 100 mg  100 mg Oral Daily Duffy Bruce, MD   100 mg at 02/03/19 [id number]   Or  . thiamine (B-1) injection 100 mg  100 mg Intravenous Daily Duffy Bruce, MD       Current Outpatient Medications  Medication Sig Dispense Refill  . divalproex (DEPAKOTE ER) 500 MG 24 hr tablet Take 1,000 mg by mouth 2 (two) times daily.     . DULoxetine (CYMBALTA) 30 MG capsule Take 30 mg by mouth daily.     . hydrOXYzine (ATARAX/VISTARIL) 10 MG tablet Take 1 tablet (10 mg total) by mouth 3 (three) times daily as needed. 12 tablet 0  . lisinopril (PRINIVIL,ZESTRIL) 10 MG tablet Take 10 mg by mouth daily.    . pravastatin (PRAVACHOL) 80 MG tablet Take 80 mg by mouth at bedtime.     . clobetasol cream (TEMOVATE) [id number] % Apply 1 application topically 2 (two) times daily.    Marland Kitchen ibuprofen (ADVIL,MOTRIN) 800 MG tablet Take 800 mg by mouth every 8 (eight) hours as needed.    . nystatin ointment (MYCOSTATIN) Apply 1 application topically 2 (two) times daily.    Marland Kitchen tolnaftate (TINACTIN) 1 % powder Apply 1 application topically 2 (two) times daily.     PTA Medications: (Not in a hospital admission)   Musculoskeletal: Strength & Muscle Tone: within normal limits Gait & Station: normal Patient leans: N/A  Psychiatric Specialty Exam: Physical Exam  Nursing note and vitals reviewed. Constitutional: He is oriented to person, place, and time. He appears well-developed and well-nourished.  HENT:  Head: Normocephalic.  Neck: Normal range of motion.  Respiratory: Effort normal.  Musculoskeletal: Normal range of motion.  Neurological: He is alert and oriented to person, place, and time.  Psychiatric: His speech is normal and behavior is normal. Judgment and thought content normal. His mood appears anxious. Cognition and  memory are normal.    Review of Systems  Psychiatric/Behavioral: Positive for substance abuse. The patient is nervous/anxious.   All other systems reviewed and are negative.   Blood pressure (!) 153/79, pulse 92, temperature 97.6 F (36.4 C), temperature source Oral, resp. rate 16, height 5\' 8"  (1.727 m), weight 77.6 kg, SpO2 95 %.Body mass index is 26 kg/m.  General Appearance: Casual  Eye Contact:  Good  Speech:  Normal Rate  Volume:  Normal  Mood:  Anxious  Affect:  Congruent  Thought Process:  Coherent and Descriptions of Associations: Intact  Orientation:  Full (Time, Place, and  Person)  Thought Content:  WDL and Logical  Suicidal Thoughts:  No  Homicidal Thoughts:  No  Memory:  Immediate;   Fair Recent;   Fair Remote;   Fair  Judgement:  Fair  Insight:  Fair  Psychomotor Activity:  Normal  Concentration:  Concentration: Fair and Attention Span: Fair  Recall:  [employer] of Knowledge:  Fair  Language:  Good  Akathisia:  No  Handed:  Right  AIMS (if indicated):     Assets:  Leisure Time Physical Health Resilience Social Support  ADL's:  Intact  Cognition:  WNL  Sleep:        Demographic Factors:  Male, Caucasian and Living alone  Loss Factors: NA  Historical Factors: NA  Risk Reduction Factors:   Sense of responsibility to family, Positive social support and Positive therapeutic relationship  Continued Clinical Symptoms:  Anxiety, mild  Cognitive Features That Contribute To Risk:  None    Suicide Risk:  Minimal: No identifiable suicidal ideation.  Patients presenting with no risk factors but with morbid ruminations; may be classified as minimal risk based on the severity of the depressive symptoms   Plan Of Care/Follow-up recommendations:  Alcohol induced mood disorder: -Ativan alcohol detox protocol -Follow up with [facility] Activity:  as tolerated Diet:  heart healthy diet  Disposition: discharge home Waylan Boga, NP 02/03/2019, 9:23 AM    Case discussed with Dr. Reita Cliche.  Agree with plan as outlined above.

## 2019-02-03 NOTE — ED Provider Notes (Signed)
The patient has been evaluated at bedside by psychiatry.  Patient is clinically stable.  Not felt to be a danger to self or others.  No SI or Hi.  No indication for inpatient psychiatric admission at this time.  Appropriate for continued outpatient therapy.    Merlyn Lot, MD 02/03/19 [phone]

## 2019-02-03 NOTE — ED Notes (Signed)
Pt desatting while sleeping. Pt placed on 3L [place].

## 2019-02-03 NOTE — Discharge Instructions (Addendum)
[facility] ([facility]stance Use [facility]) & [facility]  Mental health service in [place], [place] Address: [address] [place], [place] [address] Hours:  Closed ? Caesar Bookman Phone: [phone]    Living With Depression Everyone experiences occasional disappointment, sadness, and loss in their lives. When you are feeling down, blue, or sad for at least 2 weeks in a row, it may mean that you have depression. Depression can affect your thoughts and feelings, relationships, daily activities, and physical health. It is caused by changes in the way your brain functions. If you receive a diagnosis of depression, your health care provider will tell you which type of depression you have and what treatment options are available to you. If you are living with depression, there are ways to help you recover from it and also ways to prevent it from coming back. How to cope with lifestyle changes Coping with stress     Stress is your bodys reaction to life changes and events, both good and bad. Stressful situations may include:  Getting married.  The death of a spouse.  Losing a job.  Retiring.  Having a baby. Stress can last just a few hours or it can be ongoing. Stress can play a major role in depression, so it is important to learn both how to cope with stress and how to think about it differently. Talk with your health care provider or a counselor if you would like to learn more about stress reduction. He or she may suggest some stress reduction techniques, such as:  Music therapy. This can include creating music or listening to music. Choose music that you enjoy and that inspires you.  Mindfulness-based meditation. This kind of meditation can be done while sitting or walking. It involves being aware of your normal breaths, rather than trying to control your breathing.  Centering  prayer. This is a kind of meditation that involves focusing on a spiritual word or phrase. Choose a word, phrase, or sacred image that is meaningful to you and that brings you peace.  Deep breathing. To do this, expand your stomach and inhale slowly through your nose. Hold your breath for 3-5 seconds, then exhale slowly, allowing your stomach muscles to relax.  Muscle relaxation. This involves intentionally tensing muscles then relaxing them. Choose a stress reduction technique that fits your lifestyle and personality. Stress reduction techniques take time and practice to develop. Set aside 5-15 minutes a day to do them. Therapists can offer training in these techniques. The training may be covered by some insurance plans. Other things you can do to manage stress include:  Keeping a stress diary. This can help you learn what triggers your stress and ways to control your response.  Understanding what your limits are and saying no to requests or events that lead to a schedule that is too full.  Thinking about how you respond to certain situations. You may not be able to control everything, but you can control how you react.  Adding humor to your life by watching funny films or TV shows.  Making time for activities that help you relax and not feeling guilty about spending your time this way.  Medicines Your health care provider may suggest certain medicines if he or she feels that they will help improve your condition. Avoid using alcohol and other substances that may prevent your medicines from working properly (may interact). It is also  important to:  Talk with your pharmacist or health care provider about all the medicines that you take, their possible side effects, and what medicines are safe to take together.  Make it your goal to take part in all treatment decisions (shared decision-making). This includes giving input on the side effects of medicines. It is best if shared decision-making  with your health care provider is part of your total treatment plan. If your health care provider prescribes a medicine, you may not notice the full benefits of it for 4-8 weeks. Most people who are treated for depression need to be on medicine for at least 6-12 months after they feel better. If you are taking medicines as part of your treatment, do not stop taking medicines without first talking to your health care provider. You may need to have the medicine slowly decreased (tapered) over time to decrease the risk of harmful side effects. Relationships Your health care provider may suggest family therapy along with individual therapy and drug therapy. While there may not be family problems that are causing you to feel depressed, it is still important to make sure your family learns as much as they can about your mental health. Having your familys support can help make your treatment successful. How to recognize changes in your condition Everyone has a different response to treatment for depression. Recovery from major depression happens when you have not had signs of major depression for two months. This may mean that you will start to:  Have more interest in doing activities.  Feel less hopeless than you did 2 months ago.  Have more energy.  Overeat less often, or have better or improving appetite.  Have better concentration. Your health care provider will work with you to decide the next steps in your recovery. It is also important to recognize when your condition is getting worse. Watch for these signs:  Having fatigue or low energy.  Eating too much or too little.  Sleeping too much or too little.  Feeling restless, agitated, or hopeless.  Having trouble concentrating or making decisions.  Having unexplained physical complaints.  Feeling irritable, angry, or aggressive. Get help as soon as you or your family members notice these symptoms coming back. How to get support and help  from others How to talk with friends and family members about your condition  Talking to friends and family members about your condition can provide you with one way to get support and guidance. Reach out to trusted friends or family members, explain your symptoms to them, and let them know that you are working with a health care provider to treat your depression. Financial resources Not all insurance plans cover mental health care, so it is important to check with your insurance carrier. If paying for co-pays or counseling services is a problem, search for a local or county mental health care center. They may be able to offer public mental health care services at low or no cost when you are not able to see a private health care provider. If you are taking medicine for depression, you may be able to get the generic form, which may be less expensive. Some makers of prescription medicines also offer help to patients who cannot afford the medicines they need. Follow these instructions at home:   Get the right amount and quality of sleep.  Cut down on using caffeine, tobacco, alcohol, and other potentially harmful substances.  Try to exercise, such as walking or lifting small weights.  Take over-the-counter and prescription medicines only as told by your health care provider.  Eat a healthy diet that includes plenty of vegetables, fruits, whole grains, low-fat dairy products, and lean protein. Do not eat a lot of foods that are high in solid fats, added sugars, or salt.  Keep all follow-up visits as told by your health care provider. This is important. Contact a health care provider if:  You stop taking your antidepressant medicines, and you have any of these symptoms: ? Nausea. ? Headache. ? Feeling lightheaded. ? Chills and body aches. ? Not being able to sleep (insomnia).  You or your friends and family think your depression is getting worse. Get help right away if:  You have thoughts  of hurting yourself or others. If you ever feel like you may hurt yourself or others, or have thoughts about taking your own life, get help right away. You can go to your nearest emergency department or call:  Your local emergency services (911 in the [place]  A suicide crisis helpline, such as the [facility] at 360-823-1072. This is open 24-hours a day. Summary  If you are living with depression, there are ways to help you recover from it and also ways to prevent it from coming back.  Work with your health care team to create a management plan that includes counseling, stress management techniques, and healthy lifestyle habits. This information is not intended to replace advice given to you by your health care provider. Make sure you discuss any questions you have with your health care provider. Document Released: 04/11/2016 Document Revised: 08/31/2018 Document Reviewed: 04/11/2016 Elsevier Patient Education  2020 [employer].

## 2019-02-03 NOTE — ED Notes (Signed)
Pt remains medically monitored per Dr. Quentin Cornwall.

## 2019-02-03 NOTE — Consult Note (Addendum)
[facility] Consult   Reason for Consult:  Overdose  Referring Physician:  EDP Patient Identification: Omar Davis MRN:  [id number] Principal Diagnosis: Major depressive disorder, recurrent severe without psychotic features ([place]) Diagnosis:  Principal Problem:   Major depressive disorder, recurrent severe without psychotic features ([place])  Total Time spent with patient: 1 hour  Subjective:   Omar Davis is a 68 y.o. male patient admitted with questionable overdose after drinking.  He was found outside of his hotel room stumbling around under the influence of alcohol, police found medicine in his room.     Patient seen and evaluated in person by this provider. He is well known to this ED but has started coming consistently.  Earlier today he was released after requesting to leave with the plan to stop drinking; however, he promptly returned to this state.  He is a English as a second language teacher and we will seek placement in the [place] system for dual diagnosis as he cannot maintain his sobriety within a few hours.  History of PTSD and depression  HPI:  Pt comes via officers from [place] [address] after drinking and taking unknown pills. Pt was walking outside on sidewalks and bystanders called to keep him from falling.  He was released from the ED earlier as he wanted to leave and planned to stop drinking.  He had presented to the ED two days in a row after drinking with belligerent behaviors after drinking with passive suicidal ideations.  He would sleep become coherent, refused rehab and AA.  He felt he could stop on his own as he did it for six months recently.  He is well known to this ED for similar presentations with no self harm until now.    Past Psychiatric History: PTSD, alcohol dependence, depression  Risk to Self:  yes Risk to Others:  none Prior Inpatient Therapy:  yes Prior Outpatient Therapy:  yes  Past Medical History:  Past Medical History:  Diagnosis Date  . Chronic pain   .  Depressed   . Hypertension   . PTSD (post-traumatic stress disorder)   . TBI (traumatic brain injury) [facility])     Past Surgical History:  Procedure Laterality Date  . BACK SURGERY    . HERNIA REPAIR    . KNEE SURGERY    . KNEE SURGERY    . prostectomy N/A    Family History: History reviewed. No pertinent family history. Family Psychiatric  History: none Social History:  Social History   Substance and Sexual Activity  Alcohol Use Yes   Comment: 1 pint of vodka     Social History   Substance and Sexual Activity  Drug Use No    Social History   Socioeconomic History  . Marital status: Legally Separated    Spouse name: Not on file  . Number of children: Not on file  . Years of education: Not on file  . Highest education level: Not on file  Occupational History  . Not on file  Social Needs  . Financial resource strain: Not on file  . Food insecurity    Worry: Not on file    Inability: Not on file  . Transportation needs    Medical: Not on file    Non-medical: Not on file  Tobacco Use  . Smoking status: Never Smoker  . Smokeless tobacco: Never Used  Substance and Sexual Activity  . Alcohol use: Yes    Comment: 1 pint of vodka  . Drug use: No  .  Sexual activity: Not Currently  Lifestyle  . Physical activity    Days per week: Not on file    Minutes per session: Not on file  . Stress: Not on file  Relationships  . Social Herbalist on phone: Not on file    Gets together: Not on file    Attends religious service: Not on file    Active member of club or organization: Not on file    Attends meetings of clubs or organizations: Not on file    Relationship status: Not on file  Other Topics Concern  . Not on file  Social History Narrative  . Not on file   Additional Social History:    Allergies:   Allergies  Allergen Reactions  . Codeine Hives and Rash  . Gabapentin Other (See Comments) and Anaphylaxis    seizures Other reaction(s): Other (See  Comments) seizures Anaphylactic Shock   . Penicillins Rash    Other reaction(s): Other (See Comments) Other Reaction: Not Assessed Rash     Labs:  Results for orders placed or performed during the hospital encounter of 02/02/19 (from the past 48 hour(s))  CBC     Status: Abnormal   Collection Time: 02/02/19  2:00 PM  Result Value Ref Range   WBC 10.6 (H) 4.0 - 10.5 K/uL   RBC 5.99 (H) 4.22 - 5.81 MIL/uL   Hemoglobin 11.6 (L) 13.0 - 17.0 g/dL   HCT 40.0 39.0 - 52.0 %   MCV 66.8 (L) 80.0 - 100.0 fL   MCH 19.4 (L) 26.0 - 34.0 pg   MCHC 29.0 (L) 30.0 - 36.0 g/dL   RDW 23.2 (H) 11.5 - 15.5 %   Platelets 309 150 - 400 K/uL   nRBC 0.0 0.0 - 0.2 %    Comment: Performed at [facility], [address] [place], [place] [address]  Comprehensive metabolic panel     Status: Abnormal   Collection Time: 02/02/19  2:00 PM  Result Value Ref Range   Sodium 144 135 - 145 mmol/L   Potassium 3.9 3.5 - 5.1 mmol/L   Chloride 113 (H) 98 - 111 mmol/L   CO2 20 (L) 22 - 32 mmol/L   Glucose, Bld 117 (H) 70 - 99 mg/dL   BUN 30 (H) 8 - 23 mg/dL   Creatinine, Ser 1.24 0.61 - 1.24 mg/dL   Calcium 8.4 (L) 8.9 - 10.3 mg/dL   Total Protein 6.5 6.5 - 8.1 g/dL   Albumin 3.8 3.5 - 5.0 g/dL   AST 55 (H) 15 - 41 U/L   ALT 36 0 - 44 U/L   Alkaline Phosphatase 118 38 - 126 U/L   Total Bilirubin 0.8 0.3 - 1.2 mg/dL   GFR calc non Af Amer 59 (L) >60 mL/min   GFR calc Af Amer >60 >60 mL/min   Anion gap 11 5 - 15    Comment: Performed at [facility], [address] [place], [place] [address]  Ethanol     Status: Abnormal   Collection Time: 02/02/19  2:00 PM  Result Value Ref Range   Alcohol, Ethyl (B) 276 (H) <10 mg/dL    Comment: (NOTE) Lowest detectable limit for serum alcohol is 10 mg/dL. For medical purposes only. Performed at [facility], [address], [place], [place] [address]   Acetaminophen level     Status: Abnormal   Collection Time: 02/02/19  2:00 PM   Result Value Ref Range   Acetaminophen (  Tylenol), Serum <10 (L) 10 - 30 ug/mL    Comment: (NOTE) Therapeutic concentrations vary significantly. A range of 10-30 ug/mL  may be an effective concentration for many patients. However, some  are best treated at concentrations outside of this range. Acetaminophen concentrations >150 ug/mL at 4 hours after ingestion  and >50 ug/mL at 12 hours after ingestion are often associated with  toxic reactions. Performed at [facility], [address] [place], [place] [id number]   Salicylate level     Status: None   Collection Time: 02/02/19  2:00 PM  Result Value Ref Range   Salicylate Lvl [id number] 2.8 - 30.0 mg/dL    Comment: Performed at [facility], [address] [place], [place] [address]    No current facility-administered medications for this encounter.    Current Outpatient Medications  Medication Sig Dispense Refill  . clobetasol cream (TEMOVATE) [id number] % Apply 1 application topically 2 (two) times daily.    . divalproex (DEPAKOTE ER) 500 MG 24 hr tablet Take 1,000 mg by mouth 2 (two) times daily.     . DULoxetine (CYMBALTA) 30 MG capsule Take 30 mg by mouth daily.     . hydrOXYzine (ATARAX/VISTARIL) 10 MG tablet Take 1 tablet (10 mg total) by mouth 3 (three) times daily as needed. 12 tablet 0  . ibuprofen (ADVIL,MOTRIN) 800 MG tablet Take 800 mg by mouth every 8 (eight) hours as needed.    Marland Kitchen lisinopril (PRINIVIL,ZESTRIL) 10 MG tablet Take 10 mg by mouth daily.    Marland Kitchen nystatin ointment (MYCOSTATIN) Apply 1 application topically 2 (two) times daily.    . pravastatin (PRAVACHOL) 80 MG tablet Take 80 mg by mouth at bedtime.     Marland Kitchen tolnaftate (TINACTIN) 1 % powder Apply 1 application topically 2 (two) times daily.      Musculoskeletal: Strength & Muscle Tone: within normal limits Gait & Station: did not witness Patient leans: N/A  Psychiatric Specialty Exam: Physical Exam  Nursing note and vitals  reviewed. Constitutional: He appears well-developed and well-nourished.  HENT:  Head: Normocephalic.  Neck: Normal range of motion.  Respiratory: Effort normal.  Musculoskeletal: Normal range of motion.  Neurological: He is alert.  Psychiatric: His speech is normal and behavior is normal. Judgment normal. His mood appears anxious. Cognition and memory are impaired. He exhibits a depressed mood. He expresses suicidal ideation. He expresses suicidal plans.    Review of Systems  Psychiatric/Behavioral: Positive for depression, substance abuse and suicidal ideas. The patient is nervous/anxious.   All other systems reviewed and are negative.   Height 5\' 6"  (1.676 m), weight 77 kg.Body mass index is 27.4 kg/m.  General Appearance: Disheveled  Eye Contact:  Fair  Speech:  Slurred  Volume:  Decreased  Mood:  Anxious and Depressed  Affect:  Congruent  Thought Process:  Coherent and Descriptions of Associations: Intact  Orientation:  Other:  person  Thought Content:  Delusions  Suicidal Thoughts:  Yes.  with intent/plan  Homicidal Thoughts:  No  Memory:  Immediate;   Poor Recent;   Poor Remote;   Poor  Judgement:  Impaired  Insight:  Lacking  Psychomotor Activity:  Decreased  Concentration:  Concentration: Poor and Attention Span: Poor  Recall:  Poor  Fund of Knowledge:  Fair  Language:  Fair  Akathisia:  No  Handed:  Right  AIMS (if indicated):     Assets:  Leisure Time Resilience  ADL's:  Intact  Cognition:  Impaired,  Mild  Sleep:  Treatment Plan Summary: Daily contact with patient to assess and evaluate symptoms and progress in treatment, Medication management and Plan major depressive disorder, recurrent, severe without psychosis:  -Continued Cymbalta 30 mg daily   Alcohol dependence: -Ativan alcohol detox protocol started  Seizures: -Restarted Depakote 1000 mg BID  Disposition: Recommend psychiatric Inpatient admission when medically cleared.  Waylan Boga,  NP 02/03/2019 2:48 PM   Case discussed with Dr. Reita Cliche.  Agree with plan as outlined above.

## 2019-02-04 MED ORDER — ACETAMINOPHEN 500 MG PO TABS
1000.0000 mg | ORAL_TABLET | Freq: Once | ORAL | Status: AC
  Administered 2019-02-04: 03:00:00 1000 mg via ORAL

## 2019-02-04 MED ORDER — IBUPROFEN 400 MG PO TABS
400.0000 mg | ORAL_TABLET | Freq: Once | ORAL | Status: AC
  Administered 2019-02-04: 13:00:00 400 mg via ORAL
  Filled 2019-02-04: qty 1

## 2019-02-04 MED ORDER — DIVALPROEX SODIUM ER 250 MG PO TB24
1000.0000 mg | ORAL_TABLET | Freq: Every day | ORAL | Status: DC
  Administered 2019-02-04: 22:00:00 1000 mg via ORAL
  Filled 2019-02-04: qty 4

## 2019-02-04 MED ORDER — ACETAMINOPHEN 500 MG PO TABS
ORAL_TABLET | ORAL | Status: AC
  Administered 2019-02-04: 03:00:00 1000 mg via ORAL
  Filled 2019-02-04: qty 2

## 2019-02-04 MED ORDER — LORAZEPAM 2 MG PO TABS
2.0000 mg | ORAL_TABLET | Freq: Once | ORAL | Status: AC
  Administered 2019-02-04: 2 mg via ORAL
  Filled 2019-02-04: qty 1

## 2019-02-04 MED ORDER — DULOXETINE HCL 20 MG PO CPEP
20.0000 mg | ORAL_CAPSULE | Freq: Every day | ORAL | Status: DC
  Administered 2019-02-04 – 2019-02-05 (×2): 20 mg via ORAL
  Filled 2019-02-04 (×2): qty 1

## 2019-02-04 NOTE — Consult Note (Signed)
[facility] Consult   Reason for Consult:  Overdose  Referring Physician:  EDP Patient Identification: Omar Davis MRN:  [id number] Principal Diagnosis: Major depressive disorder, recurrent severe without psychotic features ([place]) Diagnosis:  Principal Problem:   Major depressive disorder, recurrent severe without psychotic features ([place]) Active Problems:   Alcohol dependence with intoxication, uncomplicated ([name])  Total Time spent with patient: 30 minutes  Subjective:   Omar Davis is a 68 y.o. male patient admitted with questionable overdose after drinking.  He was found outside of his hotel room stumbling around under the influence of alcohol, police found medicine in his room.     02/04/19:  Patient seen and evaluated in person by this provider.  He continues to endorse suicidal ideations with plan to drink himself to death.  No homicidal ideations or hallucinations.  Pleasant and cooperative, inpatient recommended.  02/03/19: Patient seen and evaluated in person by this provider. He is well known to this ED but has started coming consistently.  Earlier today he was released after requesting to leave with the plan to stop drinking; however, he promptly returned to this state.  He is a English as a second language teacher and we will seek placement in the [place] system for dual diagnosis as he cannot maintain his sobriety within a few hours.  History of PTSD and depression  HPI:  Pt comes via officers from [place] after drinking and taking unknown pills. Pt was walking outside on sidewalks and bystanders called to keep him from falling.  He was released from the ED earlier as he wanted to leave and planned to stop drinking.  He had presented to the ED two days in a row after drinking with belligerent behaviors after drinking with passive suicidal ideations.  He would sleep become coherent, refused rehab and AA.  He felt he could stop on his own as he did it for six months recently.  He is well  known to this ED for similar presentations with no self harm until now.    Past Psychiatric History: PTSD, alcohol dependence, depression  Risk to Self: Suicidal Ideation: No-Not Currently/Within Last 6 Months Suicidal Intent: No-Not Currently/Within Last 6 Months Is patient at risk for suicide?: Yes Suicidal Plan?: No What has been your use of drugs/alcohol within the last 12 months?: Excessive Alcohol use  How many times?: 1 Other Self Harm Risks: noe Triggers for Past Attempts: Unknown Intentional Self Injurious Behavior: [place] Risk to Others: Homicidal Ideation: No Thoughts of Harm to Others: No Current Homicidal Intent: No Current Homicidal Plan: No Access to Homicidal Means: No Identified Victim: none History of harm to others?: No Violent Behavior Description: none Does patient have access to weapons?: No Criminal Charges Pending?: Nonone Prior Inpatient Therapy: Prior Inpatient Therapy: Yes Prior Therapy Dates: 2020(Per Pt report) Prior Therapy Facilty[facility]Provider[facility]): [facility]  Reason for Treatment: depression yes Prior Outpatient Therapy: Prior Outpatient Therapy: Yes Prior Therapy Dates: Current  [facility]  Reason for Treatment: Depression  Does patient have an ACCT team?: No Does patient have Intensive In-House Services?  : No Does patient have [facility] services? : No Does patient have P4CC services?: [place]  Past Medical History:  Past Medical History:  Diagnosis Date  . Chronic pain   . Depressed   . Hypertension   . PTSD (post-traumatic stress disorder)   . TBI (traumatic brain injury) [facility])     Past Surgical History:  Procedure Laterality Date  . BACK SURGERY    . HERNIA  REPAIR    . KNEE SURGERY    . KNEE SURGERY    . prostectomy N/A    Family History: History reviewed. No pertinent family history. Family Psychiatric  History: none Social History:  Social History   Substance and Sexual Activity  Alcohol Use Yes    Comment: 1 pint of vodka     Social History   Substance and Sexual Activity  Drug Use No    Social History   Socioeconomic History  . Marital status: Legally Separated    Spouse name: Not on file  . Number of children: Not on file  . Years of education: Not on file  . Highest education level: Not on file  Occupational History  . Not on file  Social Needs  . Financial resource strain: Not on file  . Food insecurity    Worry: Not on file    Inability: Not on file  . Transportation needs    Medical: Not on file    Non-medical: Not on file  Tobacco Use  . Smoking status: Never Smoker  . Smokeless tobacco: Never Used  Substance and Sexual Activity  . Alcohol use: Yes    Comment: 1 pint of vodka  . Drug use: No  . Sexual activity: Not Currently  Lifestyle  . Physical activity    Days per week: Not on file    Minutes per session: Not on file  . Stress: Not on file  Relationships  . Social Herbalist on phone: Not on file    Gets together: Not on file    Attends religious service: Not on file    Active member of club or organization: Not on file    Attends meetings of clubs or organizations: Not on file    Relationship status: Not on file  Other Topics Concern  . Not on file  Social History Narrative  . Not on file   Additional Social History:    Allergies:   Allergies  Allergen Reactions  . Codeine Hives and Rash  . Gabapentin Other (See Comments) and Anaphylaxis    seizures Other reaction(s): Other (See Comments) seizures Anaphylactic Shock   . Penicillins Rash    Other reaction(s): Other (See Comments) Other Reaction: Not Assessed Rash   . Lisinopril Swelling    Pt reports lip swelling    Labs:  Results for orders placed or performed during the hospital encounter of 02/03/19 (from the past 48 hour(s))  CBC with Differential/Platelet     Status: Abnormal   Collection Time: 02/03/19  2:50 PM  Result Value Ref Range   WBC 9.9 4.0 - 10.5  K/uL   RBC 6.45 (H) 4.22 - 5.81 MIL/uL   Hemoglobin 12.4 (L) 13.0 - 17.0 g/dL   HCT 42.9 39.0 - 52.0 %   MCV 66.5 (L) 80.0 - 100.0 fL   MCH 19.2 (L) 26.0 - 34.0 pg   MCHC 28.9 (L) 30.0 - 36.0 g/dL   RDW 23.2 (H) 11.5 - 15.5 %   Platelets 333 150 - 400 K/uL   nRBC 0.0 0.0 - 0.2 %   Neutrophils Relative % 70 %   Neutro Abs 6.9 1.7 - 7.7 K/uL   Lymphocytes Relative 15 %   Lymphs Abs 1.4 0.7 - 4.0 K/uL   Monocytes Relative 9 %   Monocytes Absolute 0.9 0.1 - 1.0 K/uL   Eosinophils Relative 4 %   Eosinophils Absolute 0.4 0.0 - 0.5 K/uL   Basophils Relative  1 %   Basophils Absolute 0.1 0.0 - 0.1 K/uL   WBC Morphology MORPHOLOGY UNREMARKABLE    RBC Morphology MIXED RBC POPULATION    Smear Review Normal platelet morphology    Immature Granulocytes 1 %   Abs Immature Granulocytes 0.08 (H) 0.00 - 0.07 K/uL   Ovalocytes PRESENT     Comment: Performed at [facility], [address] [place], [place] [address]  Comprehensive metabolic panel     Status: Abnormal   Collection Time: 02/03/19  2:50 PM  Result Value Ref Range   Sodium 140 135 - 145 mmol/L   Potassium 3.5 3.5 - 5.1 mmol/L   Chloride 108 98 - 111 mmol/L   CO2 19 (L) 22 - 32 mmol/L   Glucose, Bld 194 (H) 70 - 99 mg/dL   BUN 22 8 - 23 mg/dL   Creatinine, Ser 1.14 0.61 - 1.24 mg/dL   Calcium 9.1 8.9 - 10.3 mg/dL   Total Protein 7.2 6.5 - 8.1 g/dL   Albumin 4.1 3.5 - 5.0 g/dL   AST 52 (H) 15 - 41 U/L   ALT 36 0 - 44 U/L   Alkaline Phosphatase 131 (H) 38 - 126 U/L   Total Bilirubin 0.7 0.3 - 1.2 mg/dL   GFR calc non Af Amer >60 >60 mL/min   GFR calc Af Amer >60 >60 mL/min   Anion gap 13 5 - 15    Comment: Performed at [facility], [address] [place], [place] [address]  Acetaminophen level     Status: Abnormal   Collection Time: 02/03/19  2:50 PM  Result Value Ref Range   Acetaminophen (Tylenol), Serum <10 (L) 10 - 30 ug/mL    Comment: (NOTE) Therapeutic concentrations vary significantly. A range  of 10-30 ug/mL  may be an effective concentration for many patients. However, some  are best treated at concentrations outside of this range. Acetaminophen concentrations >150 ug/mL at 4 hours after ingestion  and >50 ug/mL at 12 hours after ingestion are often associated with  toxic reactions. Performed at [facility], [address] [place], [place] [address]   Ethanol     Status: Abnormal   Collection Time: 02/03/19  2:50 PM  Result Value Ref Range   Alcohol, Ethyl (B) 281 (H) <10 mg/dL    Comment: (NOTE) Lowest detectable limit for serum alcohol is 10 mg/dL. For medical purposes only. Performed at [facility], [address] [place],  [address]   Urine Drug Screen, [facility] only)     Status: None   Collection Time: 02/03/19  3:28 PM  Result Value Ref Range   Tricyclic, Ur Screen NONE DETECTED NONE DETECTED   Amphetamines, Ur Screen NONE DETECTED NONE DETECTED   MDMA (Ecstasy)Ur Screen NONE DETECTED NONE DETECTED   Cocaine Metabolite,Ur [place] NONE DETECTED NONE DETECTED   Opiate, Ur Screen NONE DETECTED NONE DETECTED   Phencyclidine (PCP) Ur S NONE DETECTED NONE DETECTED   Cannabinoid 50 Ng, Ur [place] NONE DETECTED NONE DETECTED   Barbiturates, Ur Screen NONE DETECTED NONE DETECTED   Benzodiazepine, Ur Scrn NONE DETECTED NONE DETECTED   Methadone Scn, Ur NONE DETECTED NONE DETECTED    Comment: (NOTE) Tricyclics + metabolites, urine    Cutoff 1000 ng/mL Amphetamines + metabolites, urine  Cutoff 1000 ng/mL MDMA (Ecstasy), urine              Cutoff 500 ng/mL Cocaine Metabolite, urine  Cutoff 300 ng/mL Opiate + metabolites, urine        Cutoff 300 ng/mL Phencyclidine (PCP), urine         Cutoff 25 ng/mL Cannabinoid, urine                 Cutoff 50 ng/mL Barbiturates + metabolites, urine  Cutoff 200 ng/mL Benzodiazepine, urine              Cutoff 200 ng/mL Methadone, urine                   Cutoff 300 ng/mL The urine drug screen  provides only a preliminary, unconfirmed analytical test result and should not be used for non-medical purposes. Clinical consideration and professional judgment should be applied to any positive drug screen result due to possible interfering substances. A more specific alternate chemical method must be used in order to obtain a confirmed analytical result. Gas chromatography / mass spectrometry (GC/MS) is the preferred confirmat ory method. Performed at [facility], [address] [place], [place] [address]   SARS Coronavirus 2 [facility] order, Performed in [facility] hospital lab) Nasopharyngeal Nasopharyngeal Swab     Status: None   Collection Time: 02/03/19  3:28 PM   Specimen: Nasopharyngeal Swab  Result Value Ref Range   SARS Coronavirus 2 NEGATIVE NEGATIVE    Comment: (NOTE) If result is NEGATIVE SARS-CoV-2 target nucleic acids are NOT DETECTED. The SARS-CoV-2 RNA is generally detectable in upper and lower  respiratory specimens during the acute phase of infection. The lowest  concentration of SARS-CoV-2 viral copies this assay can detect is 250  copies / mL. A negative result does not preclude SARS-CoV-2 infection  and should not be used as the sole basis for treatment or other  patient management decisions.  A negative result may occur with  improper specimen collection / handling, submission of specimen other  than nasopharyngeal swab, presence of viral mutation(s) within the  areas targeted by this assay, and inadequate number of viral copies  (<250 copies / mL). A negative result must be combined with clinical  observations, patient history, and epidemiological information. If result is POSITIVE SARS-CoV-2 target nucleic acids are DETECTED. The SARS-CoV-2 RNA is generally detectable in upper and lower  respiratory specimens dur ing the acute phase of infection.  Positive  results are indicative of active infection with SARS-CoV-2.  Clinical  correlation  with patient history and other diagnostic information is  necessary to determine patient infection status.  Positive results do  not rule out bacterial infection or co-infection with other viruses. If result is PRESUMPTIVE POSTIVE SARS-CoV-2 nucleic acids MAY BE PRESENT.   A presumptive positive result was obtained on the submitted specimen  and confirmed on repeat testing.  While 2019 novel coronavirus  (SARS-CoV-2) nucleic acids may be present in the submitted sample  additional confirmatory testing may be necessary for epidemiological  and / or clinical management purposes  to differentiate between  SARS-CoV-2 and other Sarbecovirus currently known to infect humans.  If clinically indicated additional testing with an alternate test  methodology [phone]) is advised. The SARS-CoV-2 RNA is generally  detectable in upper and lower respiratory sp ecimens during the acute  phase of infection. The expected result is Negative. Fact Sheet for Patients:  StrictlyIdeas.no Fact Sheet for Healthcare Providers: BankingDealers.co.za This test is not yet approved or cleared by the [place] FDA and has been authorized for detection and/or diagnosis of SARS-CoV-2 by FDA under an Emergency Use  Authorization (EUA).  This EUA will remain in effect (meaning this test can be used) for the duration of the COVID-19 declaration under Section 564(b)(1) of the Act, 21 U.S.C. section 360bbb-3(b)(1), unless the authorization is terminated or revoked sooner. Performed at [facility], [address] [place], [place] [address]     Current Facility-Administered Medications  Medication Dose Route Frequency Provider Last Rate Last Dose  . hydrOXYzine (ATARAX/VISTARIL) tablet 25 mg  25 mg Oral Q6H PRN Patrecia Pour, NP   25 mg at 02/04/19 1800  . loperamide (IMODIUM) capsule 2-4 mg  2-4 mg Oral PRN Patrecia Pour, NP      . LORazepam (ATIVAN)  tablet 1 mg  1 mg Oral Q6H PRN Patrecia Pour, NP   1 mg at 02/04/19 [id number]  . LORazepam (ATIVAN) tablet 1 mg  1 mg Oral QID Patrecia Pour, NP   1 mg at 02/04/19 1800   Followed by  . [START ON 02/05/2019] LORazepam (ATIVAN) tablet 1 mg  1 mg Oral TID Patrecia Pour, NP       Followed by  . [START ON 02/06/2019] LORazepam (ATIVAN) tablet 1 mg  1 mg Oral BID Patrecia Pour, NP       Followed by  . [START ON 02/07/2019] LORazepam (ATIVAN) tablet 1 mg  1 mg Oral Daily Waylan Boga Y, NP      . multivitamin with minerals tablet 1 tablet  1 tablet Oral Daily Patrecia Pour, NP   1 tablet at 02/04/19 0910  . ondansetron (ZOFRAN-ODT) disintegrating tablet 4 mg  4 mg Oral Q6H PRN Patrecia Pour, NP      . thiamine (VITAMIN B-1) tablet 100 mg  100 mg Oral Daily Patrecia Pour, NP   100 mg at 02/04/19 [id number]   Current Outpatient Medications  Medication Sig Dispense Refill  . clobetasol cream (TEMOVATE) [id number] % Apply 1 application topically 2 (two) times daily.    . divalproex (DEPAKOTE ER) 500 MG 24 hr tablet Take 1,000 mg by mouth 2 (two) times daily.     . DULoxetine (CYMBALTA) 30 MG capsule Take 30 mg by mouth daily.     . hydrOXYzine (ATARAX/VISTARIL) 10 MG tablet Take 1 tablet (10 mg total) by mouth 3 (three) times daily as needed. 12 tablet 0  . ibuprofen (ADVIL,MOTRIN) 800 MG tablet Take 800 mg by mouth every 8 (eight) hours as needed.    Marland Kitchen lisinopril (PRINIVIL,ZESTRIL) 10 MG tablet Take 10 mg by mouth daily.    Marland Kitchen nystatin ointment (MYCOSTATIN) Apply 1 application topically 2 (two) times daily.    . pravastatin (PRAVACHOL) 80 MG tablet Take 80 mg by mouth at bedtime.     Marland Kitchen tolnaftate (TINACTIN) 1 % powder Apply 1 application topically 2 (two) times daily.      Musculoskeletal: Strength & Muscle Tone: within normal limits Gait & Station: did not witness Patient leans: N/A  Psychiatric Specialty Exam: Physical Exam  Nursing note and vitals reviewed. Constitutional: He appears  well-developed and well-nourished.  HENT:  Head: Normocephalic.  Neck: Normal range of motion.  Respiratory: Effort normal.  Musculoskeletal: Normal range of motion.  Neurological: He is alert.  Psychiatric: His speech is normal and behavior is normal. Judgment normal. His mood appears anxious. Cognition and memory are impaired. He exhibits a depressed mood. He expresses suicidal ideation. He expresses suicidal plans.    Review of Systems  Psychiatric/Behavioral: Positive for depression, substance abuse and suicidal ideas.  The patient is nervous/anxious.   All other systems reviewed and are negative.   Blood pressure (!) 166/78, pulse 89, temperature 98 F (36.7 C), temperature source Oral, resp. rate 18, height 5\' 6"  (1.676 m), weight 77 kg, SpO2 100 %.Body mass index is 27.4 kg/m.  General Appearance: Disheveled  Eye Contact:  Fair  Speech:  Slurred  Volume:  Decreased  Mood:  Anxious and Depressed  Affect:  Congruent  Thought Process:  Coherent and Descriptions of Associations: Intact  Orientation:  Other:  person  Thought Content:  Delusions  Suicidal Thoughts:  Yes.  with intent/plan  Homicidal Thoughts:  No  Memory:  Immediate;   Poor Recent;   Poor Remote;   Poor  Judgement:  Impaired  Insight:  Lacking  Psychomotor Activity:  Decreased  Concentration:  Concentration: Poor and Attention Span: Poor  Recall:  Poor  Fund of Knowledge:  Fair  Language:  Fair  Akathisia:  No  Handed:  Right  AIMS (if indicated):     Assets:  Leisure Time Resilience  ADL's:  Intact  Cognition:  Impaired,  Mild  Sleep:       Treatment Plan Summary: Daily contact with patient to assess and evaluate symptoms and progress in treatment, Medication management and Plan major depressive disorder, recurrent, severe without psychosis:  -Continued Cymbalta 30 mg daily   Alcohol dependence: -Ativan alcohol detox protocol started  Seizures: -Restarted Depakote 1000 mg BID  Disposition:  Recommend psychiatric Inpatient admission when medically cleared.  Waylan Boga, NP 02/04/2019 6:24 PM   Case discussed with Dr. Reita Cliche.  Agree with plan as outlined above.

## 2019-02-04 NOTE — ED Notes (Signed)
Pt awake and eating breakfast.

## 2019-02-04 NOTE — BH Assessment (Signed)
Writer spoke with [facility] [phone] ext [phone]), they received the patients information and currently pending review. However, they are on Psych Diversion, as well as [facility].  Was advised to check tomorrow or try [facility].

## 2019-02-04 NOTE — ED Notes (Signed)
Pt ate all of breakfast.pt walking around room, ambulates safely.

## 2019-02-04 NOTE — ED Notes (Signed)
Gave pt food tray with juice. 

## 2019-02-04 NOTE — BH Assessment (Signed)
Assessment Note  Omar Davis is an 68 y.o. male. male with a history of hypertension PTSD and TBI and alcohol abuse who comes to the ED by police. Pt has been place under IVC. Patient well known to the service with multiple admissions, most presentation occurring on 02/01/2019. Please refer to most recent assessment for complete psychosocial history. Pt presents today as oriented X4 and with primary complaints of chronic pain. Patient has had multiple visit to the emergency department with a history of alcohol abuse.   Patient reports "I recently had surgery." He explains that following surgery he noted that he is in an extreme amount of pain. Patients states "only alcohol seems to help relieve this pain." The patient denies any previous thoughts of suicide. He reports that he takes his prescribed medications as recommended. Patient denies taking any medications to harm himself. he states that he often feels suicidal when hes intoxicated. He reports drinking daily and shares that this has been own going issue for some time. Pt admits to one previous suicide attempt which occurred in 1998. He currently receives outpatient mental health services from the [facility] in [place]. Pt states " I promise I wont drink any more today." He has contracted for safety and had requested to be discharged home although the patient has presented multiple times with similar complaints and continues to represent to the emergency department.   Diagnosis: Substance Induced Depressive Mood  Past Medical History:  Past Medical History:  Diagnosis Date  . Chronic pain   . Depressed   . Hypertension   . PTSD (post-traumatic stress disorder)   . TBI (traumatic brain injury) [facility])     Past Surgical History:  Procedure Laterality Date  . BACK SURGERY    . HERNIA REPAIR    . KNEE SURGERY    . KNEE SURGERY    . prostectomy N/A     Family History: History reviewed. No pertinent family  history.  Social History:  reports that he has never smoked. He has never used smokeless tobacco. He reports current alcohol use. He reports that he does not use drugs.  Additional Social History:  Alcohol / Drug Use Pain Medications: SEE PTA Prescriptions: SEE PTA Over the Counter: SEE PTA  CIWA: CIWA-Ar BP: 119/90 Pulse Rate: 85 Nausea and Vomiting: no nausea and no vomiting Tactile Disturbances: none Tremor: no tremor Auditory Disturbances: not present Paroxysmal Sweats: no sweat visible Visual Disturbances: not present Anxiety: mildly anxious Headache, Fullness in Head: none present Agitation: somewhat more than normal activity Orientation and Clouding of Sensorium: oriented and can do serial additions CIWA-Ar Total: 2 COWS:    Allergies:  Allergies  Allergen Reactions  . Codeine Hives and Rash  . Gabapentin Other (See Comments) and Anaphylaxis    seizures Other reaction(s): Other (See Comments) seizures Anaphylactic Shock   . Penicillins Rash    Other reaction(s): Other (See Comments) Other Reaction: Not Assessed Rash     Home Medications: (Not in a hospital admission)   OB/GYN Status:  No LMP for male patient.  General Assessment Data Location of Assessment: [facility] ED TTS Assessment: In system Is this a Tele or Face-to-Face Assessment?: Face-to-Face Is this an Initial Assessment or a Re-assessment for this encounter?: Initial Assessment Living Arrangements: Other (Comment) What gender do you identify as?: Male Marital status: Single Living Arrangements: Alone Can pt return to current living arrangement?: Yes Admission Status: Involuntary Petitioner: ED Attending Is patient capable of signing voluntary admission?: No  Referral Source: Other Insurance type: [place]  Medical Screening Exam ([place]) Medical Exam completed: Yes  Crisis Care Plan Living Arrangements: Alone Name of Psychiatrist: [place] [place] Name of Therapist: none  Education  Status Is patient currently in school?: No Is the patient employed, unemployed or receiving disability?: Receiving disability income  Risk to self with the past 6 months Suicidal Ideation: No-Not Currently/Within Last 6 Months Has patient been a risk to self within the past 6 months prior to admission? : Yes Suicidal Intent: No-Not Currently/Within Last 6 Months Has patient had any suicidal intent within the past 6 months prior to admission? : No Is patient at risk for suicide?: Yes Suicidal Plan?: No Has patient had any suicidal plan within the past 6 months prior to admission? : No What has been your use of drugs/alcohol within the last 12 months?: Excessive Alcohol use  Previous Attempts/Gestures: Yes How many times?: 1 Other Self Harm Risks: noe Triggers for Past Attempts: Unknown Intentional Self Injurious Behavior: None Family Suicide History: No Recent stressful life event(s): Financial Problems, Recent negative physical changes Persecutory voices/beliefs?: No Depression: Yes Depression Symptoms: Isolating, Loss of interest in usual pleasures, Feeling angry/irritable Substance abuse history and/or treatment for substance abuse?: No Suicide prevention information given to non-admitted patients: Not applicable  Risk to Others within the past 6 months Homicidal Ideation: No Does patient have any lifetime risk of violence toward others beyond the six months prior to admission? : No Thoughts of Harm to Others: No Current Homicidal Intent: No Current Homicidal Plan: No Access to Homicidal Means: No Identified Victim: none History of harm to others?: No Violent Behavior Description: none Does patient have access to weapons?: No Criminal Charges Pending?: No Is patient on probation?: No  Psychosis Hallucinations: None noted Delusions: None noted  Mental Status Report Appearance/Hygiene: In scrubs Eye Contact: Fair Motor Activity: Restlessness Speech: Logical/coherent,  Slow Level of Consciousness: Quiet/awake Mood: Anxious Affect: Anxious Anxiety Level: Minimal Thought Processes: Coherent Judgement: Partial Orientation: Time, Place, Person, Situation Obsessive Compulsive Thoughts/Behaviors: None  Cognitive Functioning Concentration: Good Memory: Remote Intact, Recent Intact Is patient IDD: No Insight: Fair Impulse Control: Fair Appetite: Fair Have you had any weight changes? : No Change Sleep: No Change Total Hours of Sleep: 5 Vegetative Symptoms: None  ADLScreening [facility] Assessment Services) Patient's cognitive ability adequate to safely complete daily activities?: Yes Patient able to express need for assistance with ADLs?: Yes Independently performs ADLs?: Yes (appropriate for developmental age)  Prior Inpatient Therapy Prior Inpatient Therapy: Yes Prior Therapy Dates: 2020(Per Pt report) Prior Therapy Facilty/Provider(s): [facility]  Reason for Treatment: depression   Prior Outpatient Therapy Prior Outpatient Therapy: Yes Prior Therapy Dates: Current  [facility]  Reason for Treatment: Depression  Does patient have an ACCT team?: No Does patient have Intensive In-House Services?  : No Does patient have [facility] services? : No Does patient have P4CC services?: No  ADL Screening (condition at time of admission) Patient's cognitive ability adequate to safely complete daily activities?: Yes Patient able to express need for assistance with ADLs?: Yes Independently performs ADLs?: Yes (appropriate for developmental age)       Abuse/Neglect Assessment (Assessment to be complete while patient is alone) Abuse/Neglect Assessment Can Be Completed: Yes Physical Abuse: Denies Verbal Abuse: Denies Sexual Abuse: Denies Exploitation of patient/patient's resources: Denies Self-Neglect: Denies Values / Beliefs Cultural Requests During Hospitalization: None Spiritual Requests During Hospitalization:  None Consults Spiritual Care Consult Needed: No Social Work Scientific laboratory technician  Needed: No Advance Directives (For Healthcare) Does Patient Have a Medical Advance Directive?: No          Disposition:  Disposition Initial Assessment Completed for this Encounter: Yes Disposition of Patient: Admit  On Site Evaluation by:   Reviewed with Physician:    Laretta Alstrom 02/04/2019 12:40 AM

## 2019-02-04 NOTE — ED Notes (Signed)
Po meds given to right knee pain. Will re-assess.

## 2019-02-04 NOTE — ED Notes (Addendum)
Scheduled meds given. Pt Reports positive relief to right knee pain.

## 2019-02-04 NOTE — ED Provider Notes (Signed)
-----------------------------------------   5:45 AM on 02/04/2019 -----------------------------------------   Blood pressure 119/90, pulse 85, temperature 98.3 F (36.8 C), temperature source Oral, resp. rate 17, height 1.676 m (5\' 6" ), weight 77 kg, SpO2 95 %.  The patient is calm and cooperative at this time.  There have been no acute events since the last update.  Awaiting placement.   Hinda Kehr, MD 02/04/19 [phone]

## 2019-02-04 NOTE — ED Notes (Signed)
IVC/  PENDING  PLACEMENT 

## 2019-02-04 NOTE — ED Notes (Signed)
[employer] desk staff called to tell pt that his wallet is at the motel. Pt informed.

## 2019-02-05 ENCOUNTER — Inpatient Hospital Stay
Admission: RE | Admit: 2019-02-05 | Discharge: 2019-02-07 | DRG: 897 | Disposition: A | Discharge status: Home / Self Care | Payer: Medicare HMO | Source: Intra-hospital | Attending: Psychiatry | Admitting: Psychiatry

## 2019-02-05 ENCOUNTER — Other Ambulatory Visit: Payer: Self-pay

## 2019-02-05 DIAGNOSIS — F1094 Alcohol use, unspecified with alcohol-induced mood disorder: Secondary | ICD-10-CM | POA: Diagnosis not present

## 2019-02-05 DIAGNOSIS — G4700 Insomnia, unspecified: Secondary | ICD-10-CM | POA: Diagnosis present

## 2019-02-05 DIAGNOSIS — I10 Essential (primary) hypertension: Secondary | ICD-10-CM | POA: Diagnosis present

## 2019-02-05 DIAGNOSIS — F4310 Post-traumatic stress disorder, unspecified: Secondary | ICD-10-CM | POA: Diagnosis present

## 2019-02-05 DIAGNOSIS — F332 Major depressive disorder, recurrent severe without psychotic features: Secondary | ICD-10-CM

## 2019-02-05 DIAGNOSIS — F1024 Alcohol dependence with alcohol-induced mood disorder: Principal | ICD-10-CM | POA: Diagnosis present

## 2019-02-05 DIAGNOSIS — F329 Major depressive disorder, single episode, unspecified: Secondary | ICD-10-CM | POA: Diagnosis present

## 2019-02-05 DIAGNOSIS — Y908 Blood alcohol level of 240 mg/100 ml or more: Secondary | ICD-10-CM | POA: Diagnosis present

## 2019-02-05 DIAGNOSIS — R45851 Suicidal ideations: Secondary | ICD-10-CM | POA: Diagnosis present

## 2019-02-05 DIAGNOSIS — R630 Anorexia: Secondary | ICD-10-CM | POA: Diagnosis present

## 2019-02-05 DIAGNOSIS — F10220 Alcohol dependence with intoxication, uncomplicated: Secondary | ICD-10-CM | POA: Diagnosis present

## 2019-02-05 DIAGNOSIS — F10121 Alcohol abuse with intoxication delirium: Secondary | ICD-10-CM | POA: Diagnosis not present

## 2019-02-05 DIAGNOSIS — Z23 Encounter for immunization: Secondary | ICD-10-CM | POA: Diagnosis present

## 2019-02-05 DIAGNOSIS — F1020 Alcohol dependence, uncomplicated: Secondary | ICD-10-CM | POA: Diagnosis present

## 2019-02-05 LAB — COMPREHENSIVE METABOLIC PANEL
ALT: 27 U/L (ref 0–44)
AST: 29 U/L (ref 15–41)
Albumin: 3.5 g/dL (ref 3.5–5.0)
Alkaline Phosphatase: 120 U/L (ref 38–126)
Anion gap: 7 (ref 5–15)
BUN: 29 mg/dL — ABNORMAL HIGH (ref 8–23)
CO2: 25 mmol/L (ref 22–32)
Calcium: 9.5 mg/dL (ref 8.9–10.3)
Chloride: 108 mmol/L (ref 98–111)
Creatinine, Ser: 1.1 mg/dL (ref 0.61–1.24)
GFR calc Af Amer: >60 mL/min (ref >60)
GFR calc non Af Amer: >60 mL/min (ref >60)
Glucose, Bld: 109 mg/dL — ABNORMAL HIGH (ref 70–99)
Potassium: 4.4 mmol/L (ref 3.5–5.1)
Sodium: 140 mmol/L (ref 135–145)
Total Bilirubin: 0.7 mg/dL (ref 0.3–1.2)
Total Protein: 6.7 g/dL (ref 6.5–8.1)

## 2019-02-05 LAB — CBC
HCT: 41.4 % (ref 39.0–52.0)
Hemoglobin: 12.1 g/dL — ABNORMAL LOW (ref 13.0–17.0)
MCH: 19.5 pg — ABNORMAL LOW (ref 26.0–34.0)
MCHC: 29.2 g/dL — ABNORMAL LOW (ref 30.0–36.0)
MCV: 66.8 fL — ABNORMAL LOW (ref 80.0–100.0)
Platelets: 307 10*3/uL (ref 150–400)
RBC: 6.2 MIL/uL — ABNORMAL HIGH (ref 4.22–5.81)
RDW: 23.2 % — ABNORMAL HIGH (ref 11.5–15.5)
WBC: 9.3 10*3/uL (ref 4.0–10.5)
nRBC: 0 % (ref 0.0–0.2)

## 2019-02-05 LAB — MAGNESIUM: Magnesium: 1.9 mg/dL (ref 1.7–2.4)

## 2019-02-05 LAB — PHOSPHORUS: Phosphorus: 3.4 mg/dL (ref 2.5–4.6)

## 2019-02-05 MED ORDER — PNEUMOCOCCAL VAC POLYVALENT 25 MCG/0.5ML IJ INJ
0.5000 mL | INJECTION | INTRAMUSCULAR | Status: AC
  Administered 2019-02-06: 0.5 mL via INTRAMUSCULAR
  Filled 2019-02-05 (×3): qty 0.5

## 2019-02-05 MED ORDER — LORAZEPAM 1 MG PO TABS
1.0000 mg | ORAL_TABLET | ORAL | Status: DC | PRN
  Administered 2019-02-05 – 2019-02-06 (×4): 2 mg via ORAL
  Filled 2019-02-05 (×4): qty 2

## 2019-02-05 MED ORDER — DULOXETINE HCL 30 MG PO CPEP
30.0000 mg | ORAL_CAPSULE | Freq: Every day | ORAL | Status: DC
  Administered 2019-02-05 – 2019-02-07 (×3): 30 mg via ORAL
  Filled 2019-02-05 (×4): qty 1

## 2019-02-05 MED ORDER — ALUM & MAG HYDROXIDE-SIMETH 200-200-20 MG/5ML PO SUSP: 30.0000 mL | ORAL | Status: DC | PRN

## 2019-02-05 MED ORDER — LORAZEPAM 2 MG/ML IJ SOLN: 1.0000 mg | INTRAMUSCULAR | Status: DC | PRN

## 2019-02-05 MED ORDER — ACETAMINOPHEN 325 MG PO TABS
650.0000 mg | ORAL_TABLET | Freq: Once | ORAL | Status: AC
  Administered 2019-02-05: 11:00:00 650 mg via ORAL
  Filled 2019-02-05: qty 2

## 2019-02-05 MED ORDER — MAGNESIUM HYDROXIDE 400 MG/5ML PO SUSP: 30.0000 mL | Freq: Every day | ORAL | Status: DC | PRN

## 2019-02-05 MED ORDER — NAPROXEN 500 MG PO TABS
500.0000 mg | ORAL_TABLET | Freq: Two times a day (BID) | ORAL | Status: DC | PRN
  Administered 2019-02-05 – 2019-02-06 (×2): 500 mg via ORAL
  Filled 2019-02-05 (×3): qty 1

## 2019-02-05 MED ORDER — VITAMIN B-1 100 MG PO TABS
100.0000 mg | ORAL_TABLET | Freq: Every day | ORAL | Status: DC
  Administered 2019-02-05 – 2019-02-07 (×3): 100 mg via ORAL
  Filled 2019-02-05 (×3): qty 1

## 2019-02-05 MED ORDER — HYDROXYZINE HCL 25 MG PO TABS
25.0000 mg | ORAL_TABLET | Freq: Three times a day (TID) | ORAL | Status: DC | PRN
  Administered 2019-02-05: 25 mg via ORAL
  Filled 2019-02-05: qty 1

## 2019-02-05 MED ORDER — DIVALPROEX SODIUM ER 500 MG PO TB24
1000.0000 mg | ORAL_TABLET | Freq: Two times a day (BID) | ORAL | Status: DC
  Administered 2019-02-05 – 2019-02-07 (×4): 1000 mg via ORAL
  Filled 2019-02-05 (×4): qty 2

## 2019-02-05 MED ORDER — FOLIC ACID 1 MG PO TABS
1.0000 mg | ORAL_TABLET | Freq: Every day | ORAL | Status: DC
  Administered 2019-02-05 – 2019-02-07 (×3): 1 mg via ORAL
  Filled 2019-02-05 (×3): qty 1

## 2019-02-05 MED ORDER — ACETAMINOPHEN 325 MG PO TABS
650.0000 mg | ORAL_TABLET | Freq: Four times a day (QID) | ORAL | Status: DC | PRN
  Administered 2019-02-05: 650 mg via ORAL
  Filled 2019-02-05: qty 2

## 2019-02-05 MED ORDER — THIAMINE HCL 100 MG/ML IJ SOLN: 100.0000 mg | Freq: Every day | INTRAMUSCULAR | Status: DC

## 2019-02-05 MED ORDER — PRAVASTATIN SODIUM 40 MG PO TABS
80.0000 mg | ORAL_TABLET | Freq: Every day | ORAL | Status: DC
  Administered 2019-02-05 – 2019-02-06 (×2): 80 mg via ORAL
  Filled 2019-02-05 (×4): qty 2

## 2019-02-05 MED ORDER — LISINOPRIL 20 MG PO TABS
10.0000 mg | ORAL_TABLET | Freq: Every day | ORAL | Status: DC
  Administered 2019-02-05 – 2019-02-07 (×3): 10 mg via ORAL
  Filled 2019-02-05 (×2): qty 1
  Filled 2019-02-05: qty 0.5
  Filled 2019-02-05: qty 1

## 2019-02-05 MED ORDER — DIVALPROEX SODIUM 500 MG PO DR TAB: 1000.0000 mg | DELAYED_RELEASE_TABLET | Freq: Two times a day (BID) | ORAL | Status: DC

## 2019-02-05 MED ORDER — DIPHENHYDRAMINE HCL 25 MG PO CAPS: 50.0000 mg | ORAL_CAPSULE | Freq: Every evening | ORAL | Status: DC | PRN

## 2019-02-05 MED ORDER — ADULT MULTIVITAMIN W/MINERALS CH
1.0000 | ORAL_TABLET | Freq: Every day | ORAL | Status: DC
  Administered 2019-02-05 – 2019-02-07 (×3): 1 via ORAL
  Filled 2019-02-05 (×3): qty 1

## 2019-02-05 NOTE — ED Notes (Signed)
Gave food tray with juice. 

## 2019-02-05 NOTE — Consult Note (Addendum)
[facility] Consult   Reason for Consult:  Overdose  Referring Physician:  EDP Patient Identification: Omar Davis MRN:  [id number] Principal Diagnosis: Major depressive disorder, recurrent severe without psychotic features ([place]) Diagnosis:  Principal Problem:   Major depressive disorder, recurrent severe without psychotic features ([place]) Active Problems:   Alcohol dependence with intoxication, uncomplicated ([place])  Total Time spent with patient: 30 minutes  Subjective:   Omar Davis is a 68 y.o. male patient admitted with questionable overdose after drinking.  He was found outside of his hotel room stumbling around under the influence of alcohol, police found medicine in his room.    02/05/19: Patient seen and reassessed in person by this provider. At this time he continues to endorse ongoin suicidal thoughts. He acknowledges that he suffers from PTSD and will always have depression. He also reports that he was restarted on his medication and is compliant at this time. He continues to be alert and oriented, calm and cooperative. He is aware of his psych admission.   02/04/19:  Patient seen and evaluated in person by this provider.  He continues to endorse suicidal ideations with plan to drink himself to death.  No homicidal ideations or hallucinations.  Pleasant and cooperative, inpatient recommended.  02/03/19: Patient seen and evaluated in person by this provider. He is well known to this ED but has started coming consistently.  Earlier today he was released after requesting to leave with the plan to stop drinking; however, he promptly returned to this state.  He is a English as a second language teacher and we will seek placement in the [place] system for dual diagnosis as he cannot maintain his sobriety within a few hours.  History of PTSD and depression  HPI:  Pt comes via officers from [place] after drinking and taking unknown pills. Pt was walking outside on sidewalks and bystanders called to  keep him from falling.  He was released from the ED earlier as he wanted to leave and planned to stop drinking.  He had presented to the ED two days in a row after drinking with belligerent behaviors after drinking with passive suicidal ideations.  He would sleep become coherent, refused rehab and AA.  He felt he could stop on his own as he did it for six months recently.  He is well known to this ED for similar presentations with no self harm until now.    Past Psychiatric History: PTSD, alcohol dependence, depression  Risk to Self: Suicidal Ideation: No-Not Currently/Within Last 6 Months Suicidal Intent: No-Not Currently/Within Last 6 Months Is patient at risk for suicide?: Yes Suicidal Plan?: No What has been your use of drugs/alcohol within the last 12 months?: Excessive Alcohol use  How many times?: 1 Other Self Harm Risks: noe Triggers for Past Attempts: Unknown Intentional Self Injurious Behavior: [place] Risk to Others: Homicidal Ideation: No Thoughts of Harm to Others: No Current Homicidal Intent: No Current Homicidal Plan: No Access to Homicidal Means: No Identified Victim: none History of harm to others?: No Violent Behavior Description: none Does patient have access to weapons?: No Criminal Charges Pending?: Nonone Prior Inpatient Therapy: Prior Inpatient Therapy: Yes Prior Therapy Dates: 2020(Per Pt report) Prior Therapy Facilty/Provider(s): [facility]  Reason for Treatment: depression yes Prior Outpatient Therapy: Prior Outpatient Therapy: Yes Prior Therapy Dates: Current  [facility]  Reason for Treatment: Depression  Does patient have an ACCT team?: No Does patient have Intensive In-House Services?  : No Does patient have [facility] services? :  No Does patient have P4CC services?: [place]  Past Medical History:  Past Medical History:  Diagnosis Date  . Chronic pain   . Depressed   . Hypertension   . PTSD (post-traumatic stress  disorder)   . TBI (traumatic brain injury) [facility])     Past Surgical History:  Procedure Laterality Date  . BACK SURGERY    . HERNIA REPAIR    . KNEE SURGERY    . KNEE SURGERY    . prostectomy N/A    Family History: History reviewed. No pertinent family history. Family Psychiatric  History: none Social History:  Social History   Substance and Sexual Activity  Alcohol Use Yes   Comment: 1 pint of vodka     Social History   Substance and Sexual Activity  Drug Use No    Social History   Socioeconomic History  . Marital status: Legally Separated    Spouse name: Not on file  . Number of children: Not on file  . Years of education: Not on file  . Highest education level: Not on file  Occupational History  . Not on file  Social Needs  . Financial resource strain: Not on file  . Food insecurity    Worry: Not on file    Inability: Not on file  . Transportation needs    Medical: Not on file    Non-medical: Not on file  Tobacco Use  . Smoking status: Never Smoker  . Smokeless tobacco: Never Used  Substance and Sexual Activity  . Alcohol use: Yes    Comment: 1 pint of vodka  . Drug use: No  . Sexual activity: Not Currently  Lifestyle  . Physical activity    Days per week: Not on file    Minutes per session: Not on file  . Stress: Not on file  Relationships  . Social Herbalist on phone: Not on file    Gets together: Not on file    Attends religious service: Not on file    Active member of club or organization: Not on file    Attends meetings of clubs or organizations: Not on file    Relationship status: Not on file  Other Topics Concern  . Not on file  Social History Narrative  . Not on file   Additional Social History:    Allergies:   Allergies  Allergen Reactions  . Codeine Hives and Rash  . Gabapentin Other (See Comments) and Anaphylaxis    seizures Other reaction(s): Other (See Comments) seizures Anaphylactic Shock   . Penicillins Rash     Other reaction(s): Other (See Comments) Other Reaction: Not Assessed Rash   . Lisinopril Swelling    Pt reports lip swelling    Labs:  Results for orders placed or performed during the hospital encounter of 02/03/19 (from the past 48 hour(s))  CBC with Differential/Platelet     Status: Abnormal   Collection Time: 02/03/19  2:50 PM  Result Value Ref Range   WBC 9.9 4.0 - 10.5 K/uL   RBC 6.45 (H) 4.22 - 5.81 MIL/uL   Hemoglobin 12.4 (L) 13.0 - 17.0 g/dL   HCT 42.9 39.0 - 52.0 %   MCV 66.5 (L) 80.0 - 100.0 fL   MCH 19.2 (L) 26.0 - 34.0 pg   MCHC 28.9 (L) 30.0 - 36.0 g/dL   RDW 23.2 (H) 11.5 - 15.5 %   Platelets 333 150 - 400 K/uL   nRBC 0.0 0.0 - 0.2 %  Neutrophils Relative % 70 %   Neutro Abs 6.9 1.7 - 7.7 K/uL   Lymphocytes Relative 15 %   Lymphs Abs 1.4 0.7 - 4.0 K/uL   Monocytes Relative 9 %   Monocytes Absolute 0.9 0.1 - 1.0 K/uL   Eosinophils Relative 4 %   Eosinophils Absolute 0.4 0.0 - 0.5 K/uL   Basophils Relative 1 %   Basophils Absolute 0.1 0.0 - 0.1 K/uL   WBC Morphology MORPHOLOGY UNREMARKABLE    RBC Morphology MIXED RBC POPULATION    Smear Review Normal platelet morphology    Immature Granulocytes 1 %   Abs Immature Granulocytes 0.08 (H) 0.00 - 0.07 K/uL   Ovalocytes PRESENT     Comment: Performed at [facility], [address] [place], [place] [address]  Comprehensive metabolic panel     Status: Abnormal   Collection Time: 02/03/19  2:50 PM  Result Value Ref Range   Sodium 140 135 - 145 mmol/L   Potassium 3.5 3.5 - 5.1 mmol/L   Chloride 108 98 - 111 mmol/L   CO2 19 (L) 22 - 32 mmol/L   Glucose, Bld 194 (H) 70 - 99 mg/dL   BUN 22 8 - 23 mg/dL   Creatinine, Ser 1.14 0.61 - 1.24 mg/dL   Calcium 9.1 8.9 - 10.3 mg/dL   Total Protein 7.2 6.5 - 8.1 g/dL   Albumin 4.1 3.5 - 5.0 g/dL   AST 52 (H) 15 - 41 U/L   ALT 36 0 - 44 U/L   Alkaline Phosphatase 131 (H) 38 - 126 U/L   Total Bilirubin 0.7 0.3 - 1.2 mg/dL   GFR calc non Af Amer >60 >60  mL/min   GFR calc Af Amer >60 >60 mL/min   Anion gap 13 5 - 15    Comment: Performed at [facility], [address] [place], [place] [address]  Acetaminophen level     Status: Abnormal   Collection Time: 02/03/19  2:50 PM  Result Value Ref Range   Acetaminophen (Tylenol), Serum <10 (L) 10 - 30 ug/mL    Comment: (NOTE) Therapeutic concentrations vary significantly. A range of 10-30 ug/mL  may be an effective concentration for many patients. However, some  are best treated at concentrations outside of this range. Acetaminophen concentrations >150 ug/mL at 4 hours after ingestion  and >50 ug/mL at 12 hours after ingestion are often associated with  toxic reactions. Performed at [facility], [address] [place], [place] [address]   Ethanol     Status: Abnormal   Collection Time: 02/03/19  2:50 PM  Result Value Ref Range   Alcohol, Ethyl (B) 281 (H) <10 mg/dL    Comment: (NOTE) Lowest detectable limit for serum alcohol is 10 mg/dL. For medical purposes only. Performed at [facility], [address] [place], [place] [address]   Urine Drug Screen, Qualitative [facility] only)     Status: None   Collection Time: 02/03/19  3:28 PM  Result Value Ref Range   Tricyclic, Ur Screen NONE DETECTED NONE DETECTED   Amphetamines, Ur Screen NONE DETECTED NONE DETECTED   MDMA (Ecstasy)Ur Screen NONE DETECTED NONE DETECTED   Cocaine Metabolite,Ur [place] NONE DETECTED NONE DETECTED   Opiate, Ur Screen NONE DETECTED NONE DETECTED   Phencyclidine (PCP) Ur S NONE DETECTED NONE DETECTED   Cannabinoid 50 Ng, Ur [place] NONE DETECTED NONE DETECTED   Barbiturates, Ur Screen NONE DETECTED NONE DETECTED   Benzodiazepine, Ur Scrn NONE DETECTED NONE DETECTED  Methadone Scn, Ur NONE DETECTED NONE DETECTED    Comment: (NOTE) Tricyclics + metabolites, urine    Cutoff 1000 ng/mL Amphetamines + metabolites, urine  Cutoff 1000 ng/mL MDMA (Ecstasy), urine              Cutoff 500  ng/mL Cocaine Metabolite, urine          Cutoff 300 ng/mL Opiate + metabolites, urine        Cutoff 300 ng/mL Phencyclidine (PCP), urine         Cutoff 25 ng/mL Cannabinoid, urine                 Cutoff 50 ng/mL Barbiturates + metabolites, urine  Cutoff 200 ng/mL Benzodiazepine, urine              Cutoff 200 ng/mL Methadone, urine                   Cutoff 300 ng/mL The urine drug screen provides only a preliminary, unconfirmed analytical test result and should not be used for non-medical purposes. Clinical consideration and professional judgment should be applied to any positive drug screen result due to possible interfering substances. A more specific alternate chemical method must be used in order to obtain a confirmed analytical result. Gas chromatography / mass spectrometry (GC/MS) is the preferred confirmat ory method. Performed at [facility], [address] [place], [place] [address]   SARS Coronavirus 2 [facility] order, Performed in [facility] hospital lab) Nasopharyngeal Nasopharyngeal Swab     Status: None   Collection Time: 02/03/19  3:28 PM   Specimen: Nasopharyngeal Swab  Result Value Ref Range   SARS Coronavirus 2 NEGATIVE NEGATIVE    Comment: (NOTE) If result is NEGATIVE SARS-CoV-2 target nucleic acids are NOT DETECTED. The SARS-CoV-2 RNA is generally detectable in upper and lower  respiratory specimens during the acute phase of infection. The lowest  concentration of SARS-CoV-2 viral copies this assay can detect is 250  copies / mL. A negative result does not preclude SARS-CoV-2 infection  and should not be used as the sole basis for treatment or other  patient management decisions.  A negative result may occur with  improper specimen collection / handling, submission of specimen other  than nasopharyngeal swab, presence of viral mutation(s) within the  areas targeted by this assay, and inadequate number of viral copies  (<250 copies / mL). A negative  result must be combined with clinical  observations, patient history, and epidemiological information. If result is POSITIVE SARS-CoV-2 target nucleic acids are DETECTED. The SARS-CoV-2 RNA is generally detectable in upper and lower  respiratory specimens dur ing the acute phase of infection.  Positive  results are indicative of active infection with SARS-CoV-2.  Clinical  correlation with patient history and other diagnostic information is  necessary to determine patient infection status.  Positive results do  not rule out bacterial infection or co-infection with other viruses. If result is PRESUMPTIVE POSTIVE SARS-CoV-2 nucleic acids MAY BE PRESENT.   A presumptive positive result was obtained on the submitted specimen  and confirmed on repeat testing.  While 2019 novel coronavirus  (SARS-CoV-2) nucleic acids may be present in the submitted sample  additional confirmatory testing may be necessary for epidemiological  and / or clinical management purposes  to differentiate between  SARS-CoV-2 and other Sarbecovirus currently known to infect humans.  If clinically indicated additional testing with an alternate test  methodology [phone]) is advised. The SARS-CoV-2 RNA is generally  detectable in upper and lower respiratory sp ecimens during the acute  phase of infection. The expected result is Negative. Fact Sheet for Patients:  StrictlyIdeas.no Fact Sheet for Healthcare Providers: BankingDealers.co.za This test is not yet approved or cleared by the [place] FDA and has been authorized for detection and/or diagnosis of SARS-CoV-2 by FDA under an Emergency Use Authorization (EUA).  This EUA will remain in effect (meaning this test can be used) for the duration of the COVID-19 declaration under Section 564(b)(1) of the Act, 21 U.S.C. section 360bbb-3(b)(1), unless the authorization is terminated or revoked sooner. Performed at  [facility], [address], [place], [place] [address]     Current Facility-Administered Medications  Medication Dose Route Frequency Provider Last Rate Last Dose  . divalproex (DEPAKOTE ER) 24 hr tablet 1,000 mg  1,000 mg Oral QHS Patrecia Pour, NP   1,000 mg at 02/04/19 2200  . DULoxetine (CYMBALTA) DR capsule 20 mg  20 mg Oral Daily Patrecia Pour, NP   20 mg at 02/05/19 1111  . hydrOXYzine (ATARAX/VISTARIL) tablet 25 mg  25 mg Oral Q6H PRN Patrecia Pour, NP   25 mg at 02/04/19 2057  . loperamide (IMODIUM) capsule 2-4 mg  2-4 mg Oral PRN Patrecia Pour, NP      . LORazepam (ATIVAN) tablet 1 mg  1 mg Oral Q6H PRN Patrecia Pour, NP   1 mg at 02/04/19 [id number]  . LORazepam (ATIVAN) tablet 1 mg  1 mg Oral TID Patrecia Pour, NP   1 mg at 02/05/19 1112   Followed by  . [START ON 02/06/2019] LORazepam (ATIVAN) tablet 1 mg  1 mg Oral BID Patrecia Pour, NP       Followed by  . [START ON 02/07/2019] LORazepam (ATIVAN) tablet 1 mg  1 mg Oral Daily Waylan Boga Y, NP      . multivitamin with minerals tablet 1 tablet  1 tablet Oral Daily Patrecia Pour, NP   1 tablet at 02/05/19 1111  . ondansetron (ZOFRAN-ODT) disintegrating tablet 4 mg  4 mg Oral Q6H PRN Patrecia Pour, NP      . thiamine (VITAMIN B-1) tablet 100 mg  100 mg Oral Daily Patrecia Pour, NP   100 mg at 02/05/19 1112   Current Outpatient Medications  Medication Sig Dispense Refill  . clobetasol cream (TEMOVATE) [id number] % Apply 1 application topically 2 (two) times daily.    . divalproex (DEPAKOTE ER) 500 MG 24 hr tablet Take 1,000 mg by mouth 2 (two) times daily.     . DULoxetine (CYMBALTA) 30 MG capsule Take 30 mg by mouth daily.     . hydrOXYzine (ATARAX/VISTARIL) 10 MG tablet Take 1 tablet (10 mg total) by mouth 3 (three) times daily as needed. 12 tablet 0  . ibuprofen (ADVIL,MOTRIN) 800 MG tablet Take 800 mg by mouth every 8 (eight) hours as needed.    Marland Kitchen lisinopril (PRINIVIL,ZESTRIL) 10 MG tablet Take 10 mg by  mouth daily.    Marland Kitchen nystatin ointment (MYCOSTATIN) Apply 1 application topically 2 (two) times daily.    . pravastatin (PRAVACHOL) 80 MG tablet Take 80 mg by mouth at bedtime.     Marland Kitchen tolnaftate (TINACTIN) 1 % powder Apply 1 application topically 2 (two) times daily.      Musculoskeletal: Strength & Muscle Tone: within normal limits Gait & Station: did not witness Patient leans: N/A  Psychiatric Specialty Exam: Physical Exam  Nursing note and  vitals reviewed. Constitutional: He appears well-developed and well-nourished.  HENT:  Head: Normocephalic.  Neck: Normal range of motion.  Respiratory: Effort normal.  Musculoskeletal: Normal range of motion.  Neurological: He is alert.  Psychiatric: His speech is normal and behavior is normal. Judgment normal. His mood appears anxious. Cognition and memory are impaired. He exhibits a depressed mood. He expresses suicidal ideation. He expresses suicidal plans.    Review of Systems  Psychiatric/Behavioral: Positive for depression, substance abuse and suicidal ideas. The patient is nervous/anxious.   All other systems reviewed and are negative.   Blood pressure (!) 153/93, pulse 85, temperature 97.9 F (36.6 C), temperature source Oral, resp. rate 18, height 5\' 6"  (1.676 m), weight 77 kg, SpO2 98 %.Body mass index is 27.4 kg/m.  General Appearance: Disheveled  Eye Contact:  Fair  Speech:  Slurred  Volume:  Decreased  Mood:  Anxious and Depressed  Affect:  Congruent  Thought Process:  Coherent and Descriptions of Associations: Intact  Orientation:  Other:  person  Thought Content:  Delusions  Suicidal Thoughts:  Yes.  with intent/plan  Homicidal Thoughts:  No  Memory:  Immediate;   Poor Recent;   Poor Remote;   Poor  Judgement:  Impaired  Insight:  Lacking  Psychomotor Activity:  Decreased  Concentration:  Concentration: Poor and Attention Span: Poor  Recall:  Poor  Fund of Knowledge:  Fair  Language:  Fair  Akathisia:  No  Handed:   Right  AIMS (if indicated):     Assets:  Leisure Time Resilience  ADL's:  Intact  Cognition:  Impaired,  Mild  Sleep:       Treatment Plan Summary: Daily contact with patient to assess and evaluate symptoms and progress in treatment, Medication management and Plan major depressive disorder, recurrent, severe without psychosis:  -Continued Cymbalta 30 mg daily   Alcohol dependence: -Ativan alcohol detox protocol started  Seizures: -Restarted Depakote 1000 mg BID  Disposition: Recommend psychiatric Inpatient admission when medically cleared. Patient has been accepted to our BMU at [facility]. Will continue current recommendations once admitted.   Suella Broad, FNP 02/05/2019 12:11 PM   Case discussed and plan agreed upon with [facility]

## 2019-02-05 NOTE — ED Notes (Signed)
BEHAVIORAL HEALTH ROUNDING Patient sleeping: No. Patient alert and oriented: yes Behavior appropriate: Yes.  ; If no, describe:  Nutrition and fluids offered: yes Toileting and hygiene offered: Yes  Sitter present: q15 minute observations  

## 2019-02-05 NOTE — ED Notes (Signed)
Patient observed lying in bed with eyes closed  Even, unlabored respirations observed   NAD pt appears to be sleeping  I will continue to monitor along with every 15 minute visual observations     

## 2019-02-05 NOTE — ED Notes (Signed)
BEHAVIORAL HEALTH ROUNDING Patient sleeping: Yes.   Patient alert and oriented: eyes closed  Appears to be asleep Behavior appropriate: Yes.  ; If no, describe:  Nutrition and fluids offered: Yes  Toileting and hygiene offered: sleeping Sitter present: q 15 minute observations

## 2019-02-05 NOTE — Tx Team (Signed)
Initial Treatment Plan 02/05/2019 3:48 PM [name] [id number]    PATIENT STRESSORS: Health problems Medication change or noncompliance Substance abuse   PATIENT STRENGTHS: Ability for insight Average or above average intelligence Capable of independent living Communication skills   PATIENT IDENTIFIED PROBLEMS: Suicidal 02/05/2019   Homeless  02/05/2019  Depressed  02/05/2019  Money Issues  02/05/2019               DISCHARGE CRITERIA:  Ability to meet basic life and health needs Adequate post-discharge living arrangements Improved stabilization in mood, thinking, and/or behavior  PRELIMINARY DISCHARGE PLAN: Outpatient therapy Return to previous living arrangement  PATIENT/FAMILY INVOLVEMENT: This treatment plan has been presented to and reviewed with the patient, Omar Davis, and/or family member,  .  The patient and family have been given the opportunity to ask questions and make suggestions.  Leodis Liverpool, RN 02/05/2019, 3:48 PM

## 2019-02-05 NOTE — BH Assessment (Signed)
Writer received phone call from [facility] Case Worker Leanna Sato M.-[phone]). Writer updated him about the patient's disposition.

## 2019-02-05 NOTE — ED Notes (Signed)
pts wallet is with the manager at the hotel where he has been staying

## 2019-02-05 NOTE — Plan of Care (Signed)
  Problem: Education: Goal: Knowledge of [facility] Education information/materials will improve Outcome: Progressing  Instructed patient  on [place]  education and unit programing , able to verbalize understanding

## 2019-02-05 NOTE — ED Notes (Signed)
Referral information for Psychiatric Hospitalization faxed to;     [facility] [phone]),   . [facility] [phone] -or- [phone]) [phone]  . [facility] [phone]),  Pt has been Placed on wait list  . [facility] [phone] -or- [phone]),   . [facility] [phone] or [phone])  . [facility] [phone] or [phone]),   . [facility] [phone]).  [facility] [phone])

## 2019-02-05 NOTE — ED Notes (Signed)
ED Is the patient under IVC or is there intent for IVC: Yes.   Is the patient medically cleared: Yes.   Is there vacancy in the ED BHU: Yes.   Is the population mix appropriate for patient: Yes.   Is the patient awaiting placement in inpatient or outpatient setting: Yes.  Awaiting inpatient placement  Has the patient had a psychiatric consult: Yes.   Survey of unit performed for contraband, proper placement and condition of furniture, tampering with fixtures in bathroom, shower, and each patient room: Yes.  ; Findings:  APPEARANCE/BEHAVIOR Calm and cooperative NEURO ASSESSMENT Orientation: oriented x3   Hallucinations: No.None noted (Hallucinations) denies Speech: Normal Gait: normal RESPIRATORY ASSESSMENT Even  Unlabored respirations  CARDIOVASCULAR ASSESSMENT Pulses equal   regular rate  Skin warm and dry   GASTROINTESTINAL ASSESSMENT no GI complaint EXTREMITIES Full ROM  PLAN OF CARE Provide calm/safe environment. Vital signs assessed twice daily. ED BHU Assessment once each 12-hour shift. Assure the ED provider has rounded once each shift. Provide and encourage hygiene. Provide redirection as needed. Assess for escalating behavior; address immediately and inform ED provider.  Assess family dynamic and appropriateness for visitation as needed: Yes.  ; If necessary, describe findings:  Educate the patient/family about BHU procedures/visitation: Yes.  ; If necessary, describe findings:

## 2019-02-05 NOTE — Progress Notes (Signed)
  Admission Note: Report from [place] from ER  D: Pt appeared depressed  With  a flat affect.  Pt  denies SI / AVH at this time29 year old white male in under the service of Dr. Weber Cooks. Patient presents with Alcoholism  and overdose. Patient was found outside his hotel room   [employer]  under   the influence of Alcohol brought to hospital  by   Police . Patient  continue to endorse suicidal ideations , plan to drink himself to death. Previous suicidal attempt 1998.  Voice of no homicidal or hallucination. Patient had came to the ER 6 times since the beginning of September .  Patient is working with Transport planner at [employer]  [phone]) Pt is redirectable and cooperative with assessment.  Patient noted redness on right foot  Bandage on pinky toe  And redness on side of foot  . Patient stated it was from wearing a new boot    Allergies: Codeine, Gabapentin Medical History: Chronic pain,  Depressed, Hypertension , PTSD  TBI  A: Pt admitted to unit per protocol, skin assessment and search done and no contraband found with MHT Tiffany M.  Pt  educated on therapeutic milieu rules. Pt was introduced to milieu by nursing staff.    R: Pt was receptive to education about the milieu .  15 min safety checks started. Probation officer offered support

## 2019-02-05 NOTE — BH Assessment (Signed)
Writer spoke with the patient to complete updated/reassessment. Patient was oriented x4. Patient voice SI and reports he is dealing with PTSD.

## 2019-02-05 NOTE — ED Notes (Signed)
BEHAVIORAL HEALTH ROUNDING Patient sleeping: Yes.   Patient alert and oriented: eyes closed  Appears asleep Behavior appropriate: Yes.  ; If no, describe:  Nutrition and fluids offered: Yes  Toileting and hygiene offered: sleeping Sitter present: q 15 minute observations   ENVIRONMENTAL ASSESSMENT Potentially harmful objects out of patient reach: Yes.   Personal belongings secured: Yes.   Patient dressed in hospital provided attire only: Yes.   Plastic bags out of patient reach: Yes.   Patient care equipment (cords, cables, call bells, lines, and drains) shortened, removed, or accounted for: Yes.   Equipment and supplies removed from bottom of stretcher: Yes.   Potentially toxic materials out of patient reach: Yes.   Sharps container removed or out of patient reach: Yes.   

## 2019-02-05 NOTE — BH Assessment (Signed)
Patient is to be admitted to [facility] by Psychiatric Nurse Practitioner Priscille Loveless.  Attending Physician will be Dr. Weber Cooks.   Patient has been assigned to room 309, by [place] staff is aware of the admission:  Omar Davis, ER Secretary    Dr. Jimmye Norman, ER MD   Amy T., Patient's Nurse   Gust Rung., Patient Access.

## 2019-02-05 NOTE — H&P (Signed)
Psychiatric Admission Assessment Adult  Patient Identification: Omar Davis MRN:  [id number] Date of Evaluation:  02/05/2019 Chief Complaint:  major depression Principal Diagnosis: Alcohol-induced mood disorder ([place]) Diagnosis:  Principal Problem:   Alcohol-induced mood disorder ([place]) Active Problems:   PTSD (post-traumatic stress disorder)   Alcohol dependence with intoxication, uncomplicated ([place])   Essential hypertension  History of Present Illness: Patient seen chart reviewed.  This is a 68 year old man with a history of alcohol abuse who was brought to the emergency room after local authorities were called to the [facility] for a disturbance.  Patient's blood alcohol level on presentation was 280.  Apparently he had been acting bizarrely and disruptive.  Patient does not have much memory of it but does not dispute it.  He says that over the last month to 2 months he has been back to drinking at least a pint a day.  Denies using any other drugs recently.  Says that he still been compliant with his prescribed outpatient psychiatric and other medication.  Patient denies being particularly depressed.  Denies suicidal or homicidal ideation.  Denies any current hallucinations or psychotic symptoms.  Has concern that he has been thrown out of the [employer] and will have to find himself a new place to live.  Generally cooperative and pleasant right now.  Patient appears to be alert and oriented without obvious memory or cognitive deficits.  He is rocking back and forth looks a little uncomfortable.  Denies any history of withdrawal but seems kind of restless. Associated Signs/Symptoms: Depression Symptoms:  insomnia, difficulty concentrating, (Hypo) Manic Symptoms:  Distractibility, Anxiety Symptoms:  None reported Psychotic Symptoms:  None reported PTSD Symptoms: Had a traumatic exposure:  Patient has a diagnosis of PTSD related to [employer].  Chronic situation.  Not reporting  particularly acute symptoms.  When he is not drinking still seems to stay pretty isolated at times.  Gets follow-up treatment through the [place]. Total Time spent with patient: 1 hour  Past Psychiatric History: Patient has a history of PTSD diagnosed and treated through the [place].  Also has a long and clear history of alcohol abuse.  We have seen him several times before over the years for intoxication and alcohol abuse.  He says that he has been able to stay sober for several months at a time when he tries.  Used to have a problem with abuse of other drugs but says he is given all that up for now.  Is the patient at risk to self? No.  Has the patient been a risk to self in the past 6 months? No.  Has the patient been a risk to self within the distant past? Yes.    Is the patient a risk to others? No.  Has the patient been a risk to others in the past 6 months? No.  Has the patient been a risk to others within the distant past? No.   Prior Inpatient Therapy:   Prior Outpatient Therapy:    Alcohol Screening:   Substance Abuse History in the last 12 months:  Yes.   Consequences of Substance Abuse: Medical Consequences:  Patient suffered a fall early this spring presumably related to drinking that resulted in a subdural hematoma that had to be treated at [facility].  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 Previous Psychotropic Medications: Yes  Psychological Evaluations: Yes  Past Medical History:  Past Medical History:  Diagnosis Date  . Chronic  pain   . Depressed   . Hypertension   . PTSD (post-traumatic stress disorder)   . TBI (traumatic brain injury) [facility])     Past Surgical History:  Procedure Laterality Date  . BACK SURGERY    . HERNIA REPAIR    . KNEE SURGERY    . KNEE SURGERY    . prostectomy N/A    Family History: History reviewed. No pertinent family history. Family Psychiatric  History: None reported Tobacco Screening:   Social History:   Social History   Substance and Sexual Activity  Alcohol Use Yes   Comment: 1 pint of vodka     Social History   Substance and Sexual Activity  Drug Use No    Additional Social History:                           Allergies:   Allergies  Allergen Reactions  . Codeine Hives and Rash  . Gabapentin Other (See Comments) and Anaphylaxis    seizures Other reaction(s): Other (See Comments) seizures Anaphylactic Shock   . Penicillins Rash    Other reaction(s): Other (See Comments) Other Reaction: Not Assessed Rash   . Lisinopril Swelling    Pt reports lip swelling   Lab Results: No results found for this or any previous visit (from the past 48 hour(s)).  Blood Alcohol level:  Lab Results  Component Value Date   ETH 281 (H) 02/03/2019   ETH 276 (H) [id number]    Metabolic Disorder Labs:  No results found for: HGBA1C, MPG No results found for: PROLACTIN No results found for: CHOL, TRIG, HDL, CHOLHDL, VLDL, LDLCALC  Current Medications: Current Facility-Administered Medications  Medication Dose Route Frequency Provider Last Rate Last Dose  . acetaminophen (TYLENOL) tablet 650 mg  650 mg Oral Q6H PRN Suella Broad, FNP      . alum & mag hydroxide-simeth (MAALOX/MYLANTA) 200-200-20 MG/5ML suspension 30 mL  30 mL Oral Q4H PRN Starkes-Perry, Gayland Curry, FNP      . diphenhydrAMINE (BENADRYL) capsule 50 mg  50 mg Oral QHS PRN Clapacs, John T, MD      . divalproex (DEPAKOTE ER) 24 hr tablet 1,000 mg  1,000 mg Oral BID Clapacs, John T, MD      . DULoxetine (CYMBALTA) DR capsule 30 mg  30 mg Oral Daily Clapacs, John T, MD      . folic acid (FOLVITE) tablet 1 mg  1 mg Oral Daily Clapacs, John T, MD      . hydrOXYzine (ATARAX/VISTARIL) tablet 25 mg  25 mg Oral TID PRN Suella Broad, FNP      . lisinopril (ZESTRIL) tablet 10 mg  10 mg Oral Daily Clapacs, John T, MD      . LORazepam (ATIVAN) tablet 1-4 mg  1-4 mg Oral Q1H PRN Clapacs, Madie Reno, MD       Or  .  LORazepam (ATIVAN) injection 1-4 mg  1-4 mg Intravenous Q1H PRN Clapacs, John T, MD      . magnesium hydroxide (MILK OF MAGNESIA) suspension 30 mL  30 mL Oral Daily PRN Starkes-Perry, Gayland Curry, FNP      . multivitamin with minerals tablet 1 tablet  1 tablet Oral Daily Clapacs, John T, MD      . naproxen (NAPROSYN) tablet 500 mg  500 mg Oral BID PRN Clapacs, Madie Reno, MD      . Derrill Memo ON 02/06/2019] pneumococcal 23 valent vaccine (PNU-IMMUNE) injection  0.5 mL  0.5 mL Intramuscular Tomorrow-1000 Clapacs, John T, MD      . pravastatin (PRAVACHOL) tablet 80 mg  80 mg Oral q1800 Clapacs, John T, MD      . thiamine (VITAMIN B-1) tablet 100 mg  100 mg Oral Daily Clapacs, John T, MD       Or  . thiamine (B-1) injection 100 mg  100 mg Intravenous Daily Clapacs, Madie Reno, MD       PTA Medications: Medications Prior to Admission  Medication Sig Dispense Refill Last Dose  . clobetasol cream (TEMOVATE) [id number] % Apply 1 application topically 2 (two) times daily.     . divalproex (DEPAKOTE ER) 500 MG 24 hr tablet Take 1,000 mg by mouth 2 (two) times daily.      . DULoxetine (CYMBALTA) 30 MG capsule Take 30 mg by mouth daily.      . hydrOXYzine (ATARAX/VISTARIL) 10 MG tablet Take 1 tablet (10 mg total) by mouth 3 (three) times daily as needed. 12 tablet 0   . ibuprofen (ADVIL,MOTRIN) 800 MG tablet Take 800 mg by mouth every 8 (eight) hours as needed.     Marland Kitchen lisinopril (PRINIVIL,ZESTRIL) 10 MG tablet Take 10 mg by mouth daily.     Marland Kitchen nystatin ointment (MYCOSTATIN) Apply 1 application topically 2 (two) times daily.     . pravastatin (PRAVACHOL) 80 MG tablet Take 80 mg by mouth at bedtime.      Marland Kitchen tolnaftate (TINACTIN) 1 % powder Apply 1 application topically 2 (two) times daily.       Musculoskeletal: Strength & Muscle Tone: within normal limits Gait & Station: normal Patient leans: N/A  Psychiatric Specialty Exam: Physical Exam  Nursing note and vitals reviewed. Constitutional: He appears well-developed and  well-nourished.  HENT:  Head: Normocephalic and atraumatic.  Eyes: Pupils are equal, round, and reactive to light. Conjunctivae are normal.  Neck: Normal range of motion.  Cardiovascular: Regular rhythm and normal heart sounds.  Respiratory: Effort normal. No respiratory distress.  GI: Soft.  Musculoskeletal: Normal range of motion.  Neurological: He is alert.  Skin: Skin is warm and dry.  Psychiatric: His affect is blunt. His speech is delayed. He is slowed. Thought content is not paranoid. Cognition and memory are impaired. He expresses impulsivity. He expresses no homicidal and no suicidal ideation.    Review of Systems  Constitutional: Negative.   HENT: Negative.   Eyes: Negative.   Respiratory: Negative.   Cardiovascular: Negative.   Gastrointestinal: Negative.   Musculoskeletal: Negative.   Skin: Negative.   Neurological: Negative.   Psychiatric/Behavioral: Positive for memory loss and substance abuse. Negative for depression, hallucinations and suicidal ideas. The patient has insomnia. The patient is not nervous/anxious.     Blood pressure (!) 146/84, pulse 88, temperature 98.6 F (37 C), temperature source Oral, resp. rate 20, height 5\' 8"  (1.727 m), weight 76.7 kg, SpO2 99 %.Body mass index is 25.7 kg/m.  General Appearance: Casual  Eye Contact:  Fair  Speech:  Clear and Coherent  Volume:  Normal  Mood:  Euthymic  Affect:  Constricted  Thought Process:  Goal Directed  Orientation:  Full (Time, Place, and Person)  Thought Content:  Logical  Suicidal Thoughts:  No  Homicidal Thoughts:  No  Memory:  Immediate;   Fair Recent;   Fair Remote;   Fair  Judgement:  Fair  Insight:  Fair  Psychomotor Activity:  Restlessness  Concentration:  Concentration: Fair  Recall:  [employer] of Knowledge:  Fair  Language:  Fair  Akathisia:  No  Handed:  Right  AIMS (if indicated):     Assets:  Desire for Improvement Financial Resources/Insurance  ADL's:  Impaired  Cognition:   Impaired,  Mild  Sleep:       Treatment Plan Summary: Daily contact with patient to assess and evaluate symptoms and progress in treatment, Medication management and Plan 68 year old gentleman with alcohol problems and PTSD.  No longer intoxicated.  A little bit restless possibly having some alcohol withdrawal but no sign of delirium.  I will put him on detox protocol to be on the safe side.  Restarting his usual outpatient psychiatric and medical medication.  Engage in individual and group therapy and assessment by treatment team.  He may need some assistance finding appropriate living quarters and placement but fortunately he does have financial resources.  No real indication to change any psychiatric medicines.  Observation Level/Precautions:  15 minute checks  Laboratory:  Chemistry Profile  Psychotherapy:    Medications:    Consultations:    Discharge Concerns:    Estimated LOS:  Other:     Physician Treatment Plan for Primary Diagnosis: Alcohol-induced mood disorder ([place]) Long Term Goal(s): Improvement in symptoms so as ready for discharge  Short Term Goals: Ability to demonstrate self-control will improve and Ability to maintain clinical measurements within normal limits will improve  Physician Treatment Plan for Secondary Diagnosis: Principal Problem:   Alcohol-induced mood disorder ([place]) Active Problems:   PTSD (post-traumatic stress disorder)   Alcohol dependence with intoxication, uncomplicated ([place])   Essential hypertension  Long Term Goal(s): Improvement in symptoms so as ready for discharge  Short Term Goals: Compliance with prescribed medications will improve  I certify that inpatient services furnished can reasonably be expected to improve the patient's condition.    Alethia Berthold, MD 9/15/20204:09 PM

## 2019-02-05 NOTE — ED Notes (Signed)
Patient up to restroom.

## 2019-02-05 NOTE — BHH Suicide Risk Assessment (Signed)
[facility] Admission Suicide Risk Assessment   Nursing information obtained from:  Patient Demographic factors:  Age 68 or older Current Mental Status:  NA Loss Factors:  Financial problems / change in socioeconomic status Historical Factors:  NA Risk Reduction Factors:  NA  Total Time spent with patient: 1 hour Principal Problem: Alcohol-induced mood disorder ([place]) Diagnosis:  Principal Problem:   Alcohol-induced mood disorder ([facility]) Active Problems:   PTSD (post-traumatic stress disorder)   Alcohol dependence with intoxication, uncomplicated ([place])   Essential hypertension  Subjective Data: Patient seen chart reviewed.  68 year old man with a well-known history of alcohol abuse brought to the emergency room after becoming intoxicated and acting bizarrely at a local motel.  On interview today the patient is sober pleasant and cooperative.  Identifies that he knows he got drunk.  Says he has been drinking again for a couple months now.  Denies any other drug use.  Seems to have pretty much blacked out the incident that brought him in here but does not dispute that he was acting bizarrely saying that he will say anything when he is intoxicated.  Patient denies being acutely depressed.  Denies suicidal or homicidal ideation.  He does identify that he is currently homeless once again having been thrown out of the motel where he has been staying.  Denies any hallucinations denies any symptoms consistent with delirium.  Chief complaint at the moment is his chronic pain in his right knee.  Continued Clinical Symptoms:    The "Alcohol Use Disorders Identification Test", Guidelines for Use in Primary Care, Second Edition.  World Pharmacologist [facility]). Score between 0-7:  no or low risk or alcohol related problems. Score between 8-15:  moderate risk of alcohol related problems. Score between 16-19:  high risk of alcohol related problems. Score 20 or above:  warrants further diagnostic evaluation for  alcohol dependence and treatment.   CLINICAL FACTORS:   Alcohol/Substance Abuse/Dependencies   Musculoskeletal: Strength & Muscle Tone: within normal limits Gait & Station: normal Patient leans: N/A  Psychiatric Specialty Exam: Physical Exam  Nursing note and vitals reviewed. Constitutional: He appears well-developed and well-nourished.  HENT:  Head: Normocephalic and atraumatic.  Eyes: Pupils are equal, round, and reactive to light. Conjunctivae are normal.  Neck: Normal range of motion.  Cardiovascular: Regular rhythm and normal heart sounds.  Respiratory: Effort normal. No respiratory distress.  GI: Soft.  Musculoskeletal: Normal range of motion.  Neurological: He is alert.  Skin: Skin is warm and dry.  Psychiatric: Judgment normal. His affect is blunt. His speech is delayed. He is slowed. Thought content is not paranoid. Cognition and memory are normal. He expresses no homicidal and no suicidal ideation.    Review of Systems  Constitutional: Negative.   HENT: Negative.   Eyes: Negative.   Respiratory: Negative.   Cardiovascular: Negative.   Gastrointestinal: Negative.   Musculoskeletal: Negative.   Skin: Negative.   Neurological: Negative.   Psychiatric/Behavioral: Positive for memory loss and substance abuse. Negative for depression, hallucinations and suicidal ideas. The patient is nervous/anxious. The patient does not have insomnia.     There were no vitals taken for this visit.There is no height or weight on file to calculate BMI.  General Appearance: Casual  Eye Contact:  Fair  Speech:  Clear and Coherent  Volume:  Normal  Mood:  Euthymic  Affect:  Constricted  Thought Process:  Goal Directed  Orientation:  Full (Time, Place, and Person)  Thought Content:  Logical  Suicidal Thoughts:  No  Homicidal Thoughts:  No  Memory:  Immediate;   Fair Recent;   Fair Remote;   Fair  Judgement:  Fair  Insight:  Fair  Psychomotor Activity:  Restlessness   Concentration:  Concentration: Fair  Recall:  [employer] of Knowledge:  Fair  Language:  Fair  Akathisia:  No  Handed:  Right  AIMS (if indicated):     Assets:  Desire for Improvement Resilience  ADL's:  Impaired  Cognition:  Impaired,  Mild  Sleep:         COGNITIVE FEATURES THAT CONTRIBUTE TO RISK:  None    SUICIDE RISK:   Minimal: No identifiable suicidal ideation.  Patients presenting with no risk factors but with morbid ruminations; may be classified as minimal risk based on the severity of the depressive symptoms  PLAN OF CARE: Patient admitted to the psychiatric unit.  Full assessment by treatment team.  Continue usual outpatient medications.  I will go ahead and order detox protocol in case he were to need anything from alcohol withdrawal although he does not have a history of seizures or DTs.  Patient is probably getting back pretty close to his baseline mental state.  Already has follow-up at the [place].  We will reassess dangerousness prior to discharge but will probably be looking at discharge fairly soon.  I certify that inpatient services furnished can reasonably be expected to improve the patient's condition.   Alethia Berthold, MD 02/05/2019, 4:01 PM

## 2019-02-05 NOTE — ED Provider Notes (Signed)
-----------------------------------------   5:42 AM on 02/05/2019 -----------------------------------------   Blood pressure (!) 148/90, pulse 78, temperature 98.3 F (36.8 C), temperature source Oral, resp. rate 20, height 5\' 6"  (1.676 m), weight 77 kg, SpO2 96 %.  The patient had no acute events since last update.  Calm and cooperative at this time.  Disposition is pending per Psychiatry/Behavioral Medicine team recommendations.     Alfred Levins, [place], MD 02/05/19 [phone]

## 2019-02-06 LAB — LIPID PANEL
Cholesterol: 218 mg/dL — ABNORMAL HIGH (ref 0–200)
HDL: 65 mg/dL (ref >40)
LDL Cholesterol: 125 mg/dL — ABNORMAL HIGH (ref 0–99)
Total CHOL/HDL Ratio: 3.4 RATIO
Triglycerides: 139 mg/dL (ref <150)
VLDL: 28 mg/dL (ref 0–40)

## 2019-02-06 LAB — TSH: TSH: 2.306 u[IU]/mL (ref 0.350–4.500)

## 2019-02-06 MED ORDER — DULOXETINE HCL 30 MG PO CPEP: 30.0000 mg | ORAL_CAPSULE | Freq: Every day | ORAL | 0 refills | Status: DC

## 2019-02-06 MED ORDER — INFLUENZA VAC A&B SA ADJ QUAD 0.5 ML IM PRSY
0.5000 mL | PREFILLED_SYRINGE | INTRAMUSCULAR | Status: DC
  Filled 2019-02-06: qty 0.5

## 2019-02-06 MED ORDER — PRAVASTATIN SODIUM 80 MG PO TABS: 80.0000 mg | ORAL_TABLET | Freq: Every day | ORAL | 0 refills | Status: DC

## 2019-02-06 MED ORDER — LISINOPRIL 10 MG PO TABS: 10.0000 mg | ORAL_TABLET | Freq: Every day | ORAL | 0 refills | Status: DC

## 2019-02-06 MED ORDER — DIVALPROEX SODIUM ER 500 MG PO TB24: 1000.0000 mg | ORAL_TABLET | Freq: Two times a day (BID) | ORAL | 0 refills | Status: DC

## 2019-02-06 MED ORDER — THIAMINE HCL 100 MG PO TABS: 100.0000 mg | ORAL_TABLET | Freq: Every day | ORAL | 0 refills | Status: DC

## 2019-02-06 MED ORDER — NAPROXEN 500 MG PO TABS: 500.0000 mg | ORAL_TABLET | Freq: Two times a day (BID) | ORAL | 0 refills | Status: DC | PRN

## 2019-02-06 NOTE — BHH Group Notes (Signed)
LCSW Group Therapy Note  02/06/2019 11:34 AM  Type of Therapy/Topic:  Group Therapy:  Emotion Regulation  Participation Level:  None   Description of Group:   The purpose of this group is to assist patients in learning to regulate negative emotions and experience positive emotions. Patients will be guided to discuss ways in which they have been vulnerable to their negative emotions. These vulnerabilities will be juxtaposed with experiences of positive emotions or situations, and patients will be challenged to use positive emotions to combat negative ones. Special emphasis will be placed on coping with negative emotions in conflict situations, and patients will process healthy conflict resolution skills.  Therapeutic Goals: 1. Patient will identify two positive emotions or experiences to reflect on in order to balance out negative emotions 2. Patient will label two or more emotions that they find the most difficult to experience 3. Patient will demonstrate positive conflict resolution skills through discussion and/or role plays  Summary of Patient Progress: Pt was present in group but did not participate.   Therapeutic Modalities:   Cognitive Behavioral Therapy Feelings Identification Dialectical Behavioral Therapy   Evalina Field, MSW, LCSW Clinical Social Work 02/06/2019 11:34 AM

## 2019-02-06 NOTE — Progress Notes (Signed)
D: Patient stated slept good last night .Stated appetite good and energy level  normal. Stated concentration  good . Stated on Depression scale 2 , hopeless 0 and anxiety 0 .( low 0-10 high) Denies suicidal  homicidal ideations .  No auditory hallucinations  No pain concerns . Appropriate ADL'S. Interacting with peers and staff. Aware of information received regarding [place] education . Emotional and mental health  improved, able to  verbalize understanding .  Voice of no safety concerns  . Encourage group participation  to work on coping , decision making and verbalizing  feelings . Received cell phone and wallet from [facility] . Instructed patient his clothes have been retreive  From  [place] and with a friend in back of [place]ianne Lei   A: Encourage patient participation with unit programming . Instruction  Given on  Medication , verbalize understanding.  R: Voice no other concerns. Staff continue to monitor

## 2019-02-06 NOTE — Progress Notes (Signed)
Recreation Therapy Notes   Date: 02/06/2019  Time: 9:30 am  Location: Craft room  Behavioral response: Appropriate   Intervention Topic: Goals   Discussion/Intervention:  Group content on today was focused on goals. Patients described what goals are and how they define goals. Individuals expressed how they go about setting goals and reaching them. The group identified how important goals are and if they make short term goals to reach long term goals. Patients described how many goals they work on at a time and what affects them not reaching their goal. Individuals described how much time they put into planning and obtaining their goals. The group participated in the intervention "My Goal Board" and made personal goal boards to help them achieve their goal.  Clinical Observations/Feedback:  Patient came to group and was focused on what peers and staff had to say about setting goals.Individual was social with peers and staff while participating in the intervention.   Zierra Laroque LRT/CTRS          Maxim Bedel 02/06/2019 11:56 AM

## 2019-02-06 NOTE — Plan of Care (Signed)
Aware of information received regarding [place] education . Emotional and mental health  improved, able to  verbalize understanding .  Voice of no safety concerns  . Encourage group participation  to work on coping , decision making and verbalizing  feelings . Problem: Education: Goal: Knowledge of [place] [facility] Education information/materials will improve Outcome: Progressing Goal: Emotional status will improve Outcome: Progressing Goal: Mental status will improve Outcome: Progressing Goal: Verbalization of understanding the information provided will improve Outcome: Progressing   Problem: Safety: Goal: Ability to remain free from injury will improve Outcome: Progressing   Problem: Coping: Goal: Coping ability will improve Outcome: Progressing   Problem: Coping: Goal: Ability to interact with others will improve Outcome: Progressing   Problem: Coping: Goal: Will verbalize feelings Outcome: Progressing

## 2019-02-06 NOTE — BHH Suicide Risk Assessment (Signed)
[place] INPATIENT:  Family/Significant Other Suicide Prevention Education  Suicide Prevention Education:  Patient Refusal for Family/Significant Other Suicide Prevention Education: The patient Omar Davis has refused to provide written consent for family/significant other to be provided Family/Significant Other Suicide Prevention Education during admission and/or prior to discharge.  Physician notified.  SPE completed with pt, as pt refused to consent to family contact. SPI pamphlet provided to pt and pt was encouraged to share information with support network, ask questions, and talk about any concerns relating to SPE. Pt denies access to guns/firearms and verbalized understanding of information provided. Mobile Crisis information also provided to pt.   [name] 02/06/2019, 9:46 AM

## 2019-02-06 NOTE — Plan of Care (Signed)
Patient affect is blunt and depressed , mood  speech is pressured and expresses's impulsivity , patient contract for safety of self  and others , patient denies current active or passive suicidal thoughts or psychotic symptoms no somatic complains, education and support is provided and encouraged leisure activity with peers, only requiring 15 minutes safety check no distress.   Problem: Education: Goal: Knowledge of [facility] Education information/materials will improve Outcome: Progressing Goal: Emotional status will improve Outcome: Progressing Goal: Mental status will improve Outcome: Progressing Goal: Verbalization of understanding the information provided will improve Outcome: Progressing   Problem: Safety: Goal: Ability to remain free from injury will improve Outcome: Progressing   Problem: Coping: Goal: Coping ability will improve Outcome: Progressing   Problem: Coping: Goal: Ability to interact with others will improve Outcome: Progressing   Problem: Coping: Goal: Will verbalize feelings Outcome: Progressing

## 2019-02-06 NOTE — Progress Notes (Signed)
[facility] MD Progress Note  02/06/2019 3:25 PM Omar Davis  MRN:  [id number] Subjective: Follow-up for this gentleman with alcohol abuse and PTSD.  Patient seen chart reviewed.  Patient was awake and casually dressed in his room.  Had no complaints.  Was alert and oriented.  Said that he would like to be discharged sooner rather than later.  Completely polite about it.  Denies suicidal thoughts no evidence of psychosis.  Seems less jittery than he was yesterday. Principal Problem: Alcohol-induced mood disorder (HCC) Diagnosis: Principal Problem:   Alcohol-induced mood disorder (HCC) Active Problems:   PTSD (post-traumatic stress disorder)   Alcohol dependence with intoxication, uncomplicated ([place])   Essential hypertension  Total Time spent with patient: 20 minutes  Past Psychiatric History: Patient has a long history of alcohol abuse and a history of PTSD.  Past Medical History:  Past Medical History:  Diagnosis Date  . Chronic pain   . Depressed   . Hypertension   . PTSD (post-traumatic stress disorder)   . TBI (traumatic brain injury) [facility])     Past Surgical History:  Procedure Laterality Date  . BACK SURGERY    . HERNIA REPAIR    . KNEE SURGERY    . KNEE SURGERY    . prostectomy N/A    Family History: History reviewed. No pertinent family history. Family Psychiatric  History: None reported Social History:  Social History   Substance and Sexual Activity  Alcohol Use Yes   Comment: 1 pint of vodka     Social History   Substance and Sexual Activity  Drug Use No    Social History   Socioeconomic History  . Marital status: Legally Separated    Spouse name: Not on file  . Number of children: Not on file  . Years of education: Not on file  . Highest education level: Not on file  Occupational History  . Not on file  Social Needs  . Financial resource strain: Not on file  . Food insecurity    Worry: Not on file    Inability: Not on file  . Transportation  needs    Medical: Not on file    Non-medical: Not on file  Tobacco Use  . Smoking status: Never Smoker  . Smokeless tobacco: Never Used  Substance and Sexual Activity  . Alcohol use: Yes    Comment: 1 pint of vodka  . Drug use: No  . Sexual activity: Not Currently  Lifestyle  . Physical activity    Days per week: Not on file    Minutes per session: Not on file  . Stress: Not on file  Relationships  . Social Herbalist on phone: Not on file    Gets together: Not on file    Attends religious service: Not on file    Active member of club or organization: Not on file    Attends meetings of clubs or organizations: Not on file    Relationship status: Not on file  Other Topics Concern  . Not on file  Social History Narrative  . Not on file   Additional Social History:                         Sleep: Fair  Appetite:  Fair  Current Medications: Current Facility-Administered Medications  Medication Dose Route Frequency Provider Last Rate Last Dose  . acetaminophen (TYLENOL) tablet 650 mg  650 mg Oral Q6H PRN Sheran Fava  S, FNP   650 mg at 02/05/19 2157  . alum & mag hydroxide-simeth (MAALOX/MYLANTA) 200-200-20 MG/5ML suspension 30 mL  30 mL Oral Q4H PRN Starkes-Perry, Gayland Curry, FNP      . diphenhydrAMINE (BENADRYL) capsule 50 mg  50 mg Oral QHS PRN ,  T, MD      . divalproex (DEPAKOTE ER) 24 hr tablet 1,000 mg  1,000 mg Oral BID , Madie Reno, MD   1,000 mg at 02/06/19 0803  . DULoxetine (CYMBALTA) DR capsule 30 mg  30 mg Oral Daily , Madie Reno, MD   30 mg at 02/06/19 0803  . folic acid (FOLVITE) tablet 1 mg  1 mg Oral Daily , Madie Reno, MD   1 mg at 02/06/19 0803  . hydrOXYzine (ATARAX/VISTARIL) tablet 25 mg  25 mg Oral TID PRN Suella Broad, FNP   25 mg at 02/05/19 2157  . [START ON 02/07/2019] influenza vaccine adjuvanted (FLUAD) injection 0.5 mL  0.5 mL Intramuscular Tomorrow-1000 ,  T, MD      . lisinopril  (ZESTRIL) tablet 10 mg  10 mg Oral Daily , Madie Reno, MD   10 mg at 02/06/19 0803  . LORazepam (ATIVAN) tablet 1-4 mg  1-4 mg Oral Q1H PRN , Madie Reno, MD   2 mg at 02/06/19 1215   Or  . LORazepam (ATIVAN) injection 1-4 mg  1-4 mg Intravenous Q1H PRN ,  T, MD      . magnesium hydroxide (MILK OF MAGNESIA) suspension 30 mL  30 mL Oral Daily PRN Starkes-Perry, Gayland Curry, FNP      . multivitamin with minerals tablet 1 tablet  1 tablet Oral Daily , Madie Reno, MD   1 tablet at 02/06/19 0802  . naproxen (NAPROSYN) tablet 500 mg  500 mg Oral BID PRN , Madie Reno, MD   500 mg at 02/05/19 1837  . pravastatin (PRAVACHOL) tablet 80 mg  80 mg Oral q1800 , Madie Reno, MD   80 mg at 02/05/19 1830  . thiamine (VITAMIN B-1) tablet 100 mg  100 mg Oral Daily ,  T, MD   100 mg at 02/06/19 0803   Or  . thiamine (B-1) injection 100 mg  100 mg Intravenous Daily , Madie Reno, MD        Lab Results:  Results for orders placed or performed during the hospital encounter of 02/05/19 (from the past 48 hour(s))  Comprehensive metabolic panel     Status: Abnormal   Collection Time: 02/05/19  6:25 PM  Result Value Ref Range   Sodium 140 135 - 145 mmol/L   Potassium 4.4 3.5 - 5.1 mmol/L   Chloride 108 98 - 111 mmol/L   CO2 25 22 - 32 mmol/L   Glucose, Bld 109 (H) 70 - 99 mg/dL   BUN 29 (H) 8 - 23 mg/dL   Creatinine, Ser 1.10 0.61 - 1.24 mg/dL   Calcium 9.5 8.9 - 10.3 mg/dL   Total Protein 6.7 6.5 - 8.1 g/dL   Albumin 3.5 3.5 - 5.0 g/dL   AST 29 15 - 41 U/L   ALT 27 0 - 44 U/L   Alkaline Phosphatase 120 38 - 126 U/L   Total Bilirubin 0.7 0.3 - 1.2 mg/dL   GFR calc non Af Amer >60 >60 mL/min   GFR calc Af Amer >60 >60 mL/min   Anion gap 7 5 - 15    Comment: Performed at [facility], [address] [place], [place]  [address]  Magnesium     Status: None   Collection Time: 02/05/19  6:25 PM  Result Value Ref Range   Magnesium 1.9 1.7 - 2.4 mg/dL    Comment:  Performed at [facility], [address] [place], [place] [address]  Phosphorus     Status: None   Collection Time: 02/05/19  6:25 PM  Result Value Ref Range   Phosphorus 3.4 2.5 - 4.6 mg/dL    Comment: Performed at [facility], [address] [place], [place] [address]  CBC     Status: Abnormal   Collection Time: 02/05/19  6:25 PM  Result Value Ref Range   WBC 9.3 4.0 - 10.5 K/uL   RBC 6.20 (H) 4.22 - 5.81 MIL/uL   Hemoglobin 12.1 (L) 13.0 - 17.0 g/dL   HCT 41.4 39.0 - 52.0 %   MCV 66.8 (L) 80.0 - 100.0 fL   MCH 19.5 (L) 26.0 - 34.0 pg   MCHC 29.2 (L) 30.0 - 36.0 g/dL   RDW 23.2 (H) 11.5 - 15.5 %   Platelets 307 150 - 400 K/uL   nRBC 0.0 0.0 - 0.2 %    Comment: Performed at [facility], [address] [place], [place] [address]  Lipid panel     Status: Abnormal   Collection Time: 02/06/19  6:37 AM  Result Value Ref Range   Cholesterol 218 (H) 0 - 200 mg/dL   Triglycerides 139 <150 mg/dL   HDL 65 >40 mg/dL   Total CHOL/HDL Ratio 3.4 RATIO   VLDL 28 0 - 40 mg/dL   LDL Cholesterol 125 (H) 0 - 99 mg/dL    Comment:        Total Cholesterol/HDL:CHD Risk Coronary Heart Disease Risk Table                     Men   Women  1/2 Average Risk   3.4   3.3  Average Risk       5.0   4.4  2 X Average Risk   9.6   7.1  3 X Average Risk  23.4   11.0        Use the calculated Patient Ratio above and the CHD Risk Table to determine the patient's CHD Risk.        ATP III CLASSIFICATION (LDL):  <100     mg/dL   Optimal  100-129  mg/dL   Near or Above                    Optimal  130-159  mg/dL   Borderline  160-189  mg/dL   High  >190     mg/dL   Very High Performed at [facility], [address] [place], [place] [address]   TSH     Status: None   Collection Time: 02/06/19  6:37 AM  Result Value Ref Range   TSH 2.306 0.350 - 4.500 uIU/mL    Comment: Performed by a 3rd Generation assay with a functional sensitivity of <=0.01  uIU/mL. Performed at [facility], [address] [place], [place] [address]     Blood Alcohol level:  Lab Results  Component Value Date   ETH 281 (H) 02/03/2019   ETH 276 (H) [id number]    Metabolic Disorder Labs: No results found for: HGBA1C, MPG No results found for: PROLACTIN Lab Results  Component Value Date   CHOL 218 (H) 02/06/2019   TRIG 139  02/06/2019   HDL 65 02/06/2019   CHOLHDL 3.4 02/06/2019   VLDL 28 02/06/2019   LDLCALC 125 (H) 02/06/2019    Physical Findings: AIMS:  , ,  ,  ,    CIWA:  CIWA-Ar Total: 2 COWS:     Musculoskeletal: Strength & Muscle Tone: within normal limits Gait & Station: normal Patient leans: N/A  Psychiatric Specialty Exam: Physical Exam  Nursing note and vitals reviewed. Constitutional: He appears well-developed and well-nourished.  HENT:  Head: Normocephalic and atraumatic.  Eyes: Pupils are equal, round, and reactive to light. Conjunctivae are normal.  Neck: Normal range of motion.  Cardiovascular: Regular rhythm and normal heart sounds.  Respiratory: Effort normal.  GI: Soft.  Musculoskeletal: Normal range of motion.  Neurological: He is alert.  Skin: Skin is warm and dry.  Psychiatric: He has a normal mood and affect. His speech is normal and behavior is normal. Judgment and thought content normal. Cognition and memory are normal.    Review of Systems  Constitutional: Negative.   HENT: Negative.   Eyes: Negative.   Respiratory: Negative.   Cardiovascular: Negative.   Gastrointestinal: Negative.   Musculoskeletal: Negative.   Skin: Negative.   Neurological: Negative.   Psychiatric/Behavioral: Negative.     Blood pressure (!) 149/70, pulse 79, temperature 99.1 F (37.3 C), temperature source Oral, resp. rate 18, height 5\' 8"  (1.727 m), weight 76.7 kg, SpO2 98 %.Body mass index is 25.7 kg/m.  General Appearance: Casual  Eye Contact:  Fair  Speech:  Clear and Coherent  Volume:  Normal  Mood:  Euthymic   Affect:  Constricted  Thought Process:  Coherent  Orientation:  Full (Time, Place, and Person)  Thought Content:  Logical  Suicidal Thoughts:  No  Homicidal Thoughts:  No  Memory:  Immediate;   Fair Recent;   Fair Remote;   Fair  Judgement:  Fair  Insight:  Fair  Psychomotor Activity:  Normal  Concentration:  Concentration: Fair  Recall:  [employer] of Knowledge:  Fair  Language:  Fair  Akathisia:  No  Handed:  Right  AIMS (if indicated):     Assets:  Financial Resources/Insurance Physical Health Resilience  ADL's:  Intact  Cognition:  WNL  Sleep:  Number of Hours: 8.15     Treatment Plan Summary: Daily contact with patient to assess and evaluate symptoms and progress in treatment, Medication management and Plan Patient with chronic alcohol abuse and other substance abuse problems history of PTSD.  Patient is currently not complaining of any acute symptoms.  Mood is stable.  Denies suicidal ideation.  Patient does not meet commitment criteria.  Does not wish to remain in the hospital.  He is quite aware of the available resources for substance abuse treatment through the [place] system.  No indication for further hospitalization we will try and arrange for discharge tomorrow.  He has enough money at this time that he feels confident that he can stay in a motel while he makes further plans for himself.  Alethia Berthold, MD 02/06/2019, 3:25 PM
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
## 2019-02-07 ENCOUNTER — Other Ambulatory Visit: Payer: Self-pay

## 2019-02-07 ENCOUNTER — Inpatient Hospital Stay
Admission: EM | Admit: 2019-02-07 | Discharge: 2019-02-08 | DRG: 894 | Discharge status: Against Medical Advice | Payer: No Typology Code available for payment source | Attending: Internal Medicine | Admitting: Internal Medicine

## 2019-02-07 ENCOUNTER — Emergency Department: Payer: No Typology Code available for payment source

## 2019-02-07 DIAGNOSIS — I10 Essential (primary) hypertension: Secondary | ICD-10-CM | POA: Diagnosis present

## 2019-02-07 DIAGNOSIS — A419 Sepsis, unspecified organism: Secondary | ICD-10-CM | POA: Diagnosis present

## 2019-02-07 DIAGNOSIS — Z880 Allergy status to penicillin: Secondary | ICD-10-CM

## 2019-02-07 DIAGNOSIS — Z79899 Other long term (current) drug therapy: Secondary | ICD-10-CM

## 2019-02-07 DIAGNOSIS — I959 Hypotension, unspecified: Secondary | ICD-10-CM | POA: Diagnosis present

## 2019-02-07 DIAGNOSIS — Z20828 Contact with and (suspected) exposure to other viral communicable diseases: Secondary | ICD-10-CM | POA: Diagnosis present

## 2019-02-07 DIAGNOSIS — Y907 Blood alcohol level of 200-239 mg/100 ml: Secondary | ICD-10-CM | POA: Diagnosis present

## 2019-02-07 DIAGNOSIS — F329 Major depressive disorder, single episode, unspecified: Secondary | ICD-10-CM | POA: Diagnosis present

## 2019-02-07 DIAGNOSIS — G8929 Other chronic pain: Secondary | ICD-10-CM | POA: Diagnosis present

## 2019-02-07 DIAGNOSIS — Z87820 Personal history of traumatic brain injury: Secondary | ICD-10-CM

## 2019-02-07 DIAGNOSIS — F1094 Alcohol use, unspecified with alcohol-induced mood disorder: Secondary | ICD-10-CM | POA: Diagnosis present

## 2019-02-07 DIAGNOSIS — T43595A Adverse effect of other antipsychotics and neuroleptics, initial encounter: Secondary | ICD-10-CM | POA: Diagnosis not present

## 2019-02-07 DIAGNOSIS — F4310 Post-traumatic stress disorder, unspecified: Secondary | ICD-10-CM | POA: Diagnosis present

## 2019-02-07 DIAGNOSIS — N179 Acute kidney failure, unspecified: Secondary | ICD-10-CM | POA: Diagnosis present

## 2019-02-07 DIAGNOSIS — F1024 Alcohol dependence with alcohol-induced mood disorder: Secondary | ICD-10-CM | POA: Diagnosis present

## 2019-02-07 DIAGNOSIS — R451 Restlessness and agitation: Secondary | ICD-10-CM | POA: Diagnosis present

## 2019-02-07 DIAGNOSIS — F10220 Alcohol dependence with intoxication, uncomplicated: Secondary | ICD-10-CM | POA: Diagnosis not present

## 2019-02-07 DIAGNOSIS — E871 Hypo-osmolality and hyponatremia: Secondary | ICD-10-CM | POA: Diagnosis present

## 2019-02-07 DIAGNOSIS — R45851 Suicidal ideations: Secondary | ICD-10-CM | POA: Diagnosis present

## 2019-02-07 DIAGNOSIS — Z888 Allergy status to other drugs, medicaments and biological substances status: Secondary | ICD-10-CM

## 2019-02-07 DIAGNOSIS — F10920 Alcohol use, unspecified with intoxication, uncomplicated: Secondary | ICD-10-CM | POA: Diagnosis not present

## 2019-02-07 DIAGNOSIS — Z885 Allergy status to narcotic agent status: Secondary | ICD-10-CM

## 2019-02-07 DIAGNOSIS — G40909 Epilepsy, unspecified, not intractable, without status epilepticus: Secondary | ICD-10-CM | POA: Diagnosis present

## 2019-02-07 DIAGNOSIS — E872 Acidosis: Secondary | ICD-10-CM | POA: Diagnosis present

## 2019-02-07 LAB — COMPREHENSIVE METABOLIC PANEL
ALT: 19 U/L (ref 0–44)
AST: 19 U/L (ref 15–41)
Albumin: 3.5 g/dL (ref 3.5–5.0)
Alkaline Phosphatase: 99 U/L (ref 38–126)
Anion gap: 13 (ref 5–15)
BUN: 40 mg/dL — ABNORMAL HIGH (ref 8–23)
CO2: 17 mmol/L — ABNORMAL LOW (ref 22–32)
Calcium: 9.3 mg/dL (ref 8.9–10.3)
Chloride: 104 mmol/L (ref 98–111)
Creatinine, Ser: 1.27 mg/dL — ABNORMAL HIGH (ref 0.61–1.24)
GFR calc Af Amer: >60 mL/min (ref >60)
GFR calc non Af Amer: 57 mL/min — ABNORMAL LOW (ref >60)
Glucose, Bld: 98 mg/dL (ref 70–99)
Potassium: 3.9 mmol/L (ref 3.5–5.1)
Sodium: 134 mmol/L — ABNORMAL LOW (ref 135–145)
Total Bilirubin: 0.5 mg/dL (ref 0.3–1.2)
Total Protein: 6.5 g/dL (ref 6.5–8.1)

## 2019-02-07 LAB — URINE DRUG SCREEN, QUALITATIVE (ARMC ONLY)
Amphetamines, Ur Screen: NOT DETECTED
Barbiturates, Ur Screen: NOT DETECTED
Benzodiazepine, Ur Scrn: NOT DETECTED
Cannabinoid 50 Ng, Ur ~~LOC~~: NOT DETECTED
Cocaine Metabolite,Ur ~~LOC~~: NOT DETECTED
MDMA (Ecstasy)Ur Screen: NOT DETECTED
Methadone Scn, Ur: NOT DETECTED
Opiate, Ur Screen: NOT DETECTED
Phencyclidine (PCP) Ur S: NOT DETECTED
Tricyclic, Ur Screen: NOT DETECTED

## 2019-02-07 LAB — CBC WITH DIFFERENTIAL/PLATELET
Abs Immature Granulocytes: 0.11 10*3/uL — ABNORMAL HIGH (ref 0.00–0.07)
Basophils Absolute: 0.1 10*3/uL (ref 0.0–0.1)
Basophils Relative: 1 %
Eosinophils Absolute: 0.2 10*3/uL (ref 0.0–0.5)
Eosinophils Relative: 1 %
HCT: 39.6 % (ref 39.0–52.0)
Hemoglobin: 11.8 g/dL — ABNORMAL LOW (ref 13.0–17.0)
Immature Granulocytes: 1 %
Lymphocytes Relative: 9 %
Lymphs Abs: 1.2 10*3/uL (ref 0.7–4.0)
MCH: 19.8 pg — ABNORMAL LOW (ref 26.0–34.0)
MCHC: 29.8 g/dL — ABNORMAL LOW (ref 30.0–36.0)
MCV: 66.4 fL — ABNORMAL LOW (ref 80.0–100.0)
Monocytes Absolute: 0.8 10*3/uL (ref 0.1–1.0)
Monocytes Relative: 6 %
Neutro Abs: 10.7 10*3/uL — ABNORMAL HIGH (ref 1.7–7.7)
Neutrophils Relative %: 82 %
Platelets: 290 10*3/uL (ref 150–400)
RBC: 5.96 MIL/uL — ABNORMAL HIGH (ref 4.22–5.81)
RDW: 22.8 % — ABNORMAL HIGH (ref 11.5–15.5)
Smear Review: NORMAL
WBC: 13 10*3/uL — ABNORMAL HIGH (ref 4.0–10.5)
nRBC: 0 % (ref 0.0–0.2)

## 2019-02-07 LAB — ETHANOL: Alcohol, Ethyl (B): 217 mg/dL — ABNORMAL HIGH (ref <10)

## 2019-02-07 LAB — URINALYSIS, ROUTINE W REFLEX MICROSCOPIC
Bilirubin Urine: NEGATIVE
Glucose, UA: NEGATIVE mg/dL
Hgb urine dipstick: NEGATIVE
Ketones, ur: NEGATIVE mg/dL
Leukocytes,Ua: NEGATIVE
Nitrite: NEGATIVE
Protein, ur: NEGATIVE mg/dL
Specific Gravity, Urine: 1.002 — ABNORMAL LOW (ref 1.005–1.030)
pH: 6 (ref 5.0–8.0)

## 2019-02-07 LAB — HEMOGLOBIN A1C
Hgb A1c MFr Bld: 5.4 % (ref 4.8–5.6)
Mean Plasma Glucose: 108 mg/dL

## 2019-02-07 LAB — VALPROIC ACID LEVEL: Valproic Acid Lvl: 59 ug/mL (ref 50.0–100.0)

## 2019-02-07 LAB — ACETAMINOPHEN LEVEL: Acetaminophen (Tylenol), Serum: <10 ug/mL — ABNORMAL LOW (ref 10–30)

## 2019-02-07 LAB — SALICYLATE LEVEL: Salicylate Lvl: <7 mg/dL (ref 2.8–30.0)

## 2019-02-07 LAB — GLUCOSE, CAPILLARY: Glucose-Capillary: 95 mg/dL (ref 70–99)

## 2019-02-07 MED ORDER — LORAZEPAM 2 MG/ML IJ SOLN
2.0000 mg | Freq: Once | INTRAMUSCULAR | Status: AC | PRN
  Administered 2019-02-08: 06:00:00 2 mg via INTRAVENOUS
  Filled 2019-02-07: qty 1

## 2019-02-07 MED ORDER — SODIUM CHLORIDE 0.9 % IV BOLUS
1000.0000 mL | Freq: Once | INTRAVENOUS | Status: AC
  Administered 2019-02-07: 19:00:00 1000 mL via INTRAVENOUS

## 2019-02-07 MED ORDER — THIAMINE HCL 100 MG PO TABS: 100.0000 mg | ORAL_TABLET | Freq: Every day | ORAL | 0 refills | Status: DC

## 2019-02-07 MED ORDER — LORAZEPAM 2 MG/ML IJ SOLN
2.0000 mg | Freq: Once | INTRAMUSCULAR | Status: AC
  Administered 2019-02-07: 22:00:00 2 mg via INTRAVENOUS
  Filled 2019-02-07: qty 1

## 2019-02-07 MED ORDER — NAPROXEN 500 MG PO TABS: 500.0000 mg | ORAL_TABLET | Freq: Two times a day (BID) | ORAL | 0 refills | Status: DC | PRN

## 2019-02-07 MED ORDER — LISINOPRIL 10 MG PO TABS: 10.0000 mg | ORAL_TABLET | Freq: Every day | ORAL | 0 refills | Status: DC

## 2019-02-07 MED ORDER — LORAZEPAM 2 MG/ML IJ SOLN
INTRAMUSCULAR | Status: AC
  Administered 2019-02-07: 2 mg via INTRAVENOUS
  Filled 2019-02-07: qty 1

## 2019-02-07 MED ORDER — SODIUM CHLORIDE 0.9 % IV BOLUS
1000.0000 mL | Freq: Once | INTRAVENOUS | Status: AC
  Administered 2019-02-07: 20:00:00 1000 mL via INTRAVENOUS

## 2019-02-07 MED ORDER — DIVALPROEX SODIUM ER 500 MG PO TB24: 1000.0000 mg | ORAL_TABLET | Freq: Two times a day (BID) | ORAL | 0 refills | Status: DC

## 2019-02-07 MED ORDER — DULOXETINE HCL 30 MG PO CPEP: 30.0000 mg | ORAL_CAPSULE | Freq: Every day | ORAL | 0 refills | Status: AC

## 2019-02-07 MED ORDER — SODIUM CHLORIDE 0.9 % IV BOLUS
1000.0000 mL | Freq: Once | INTRAVENOUS | Status: AC
  Administered 2019-02-07: 1000 mL via INTRAVENOUS

## 2019-02-07 MED ORDER — PRAVASTATIN SODIUM 80 MG PO TABS: 80.0000 mg | ORAL_TABLET | Freq: Every day | ORAL | 0 refills | Status: AC

## 2019-02-07 MED ORDER — ZIPRASIDONE MESYLATE 20 MG IM SOLR
20.0000 mg | Freq: Once | INTRAMUSCULAR | Status: AC
  Administered 2019-02-07: 20 mg via INTRAMUSCULAR

## 2019-02-07 NOTE — Tx Team (Signed)
Interdisciplinary Treatment and Diagnostic Plan Update  02/07/2019 Time of Session: 900am Omar Davis MRN: [id number]  Principal Diagnosis: Alcohol-induced mood disorder ([place])  Secondary Diagnoses: Principal Problem:   Alcohol-induced mood disorder ([place]) Active Problems:   PTSD (post-traumatic stress disorder)   Alcohol dependence with intoxication, uncomplicated ([place])   Essential hypertension   Current Medications:  Current Facility-Administered Medications  Medication Dose Route Frequency Provider Last Rate Last Dose  . acetaminophen (TYLENOL) tablet 650 mg  650 mg Oral Q6H PRN Suella Broad, FNP   650 mg at 02/05/19 2157  . alum & mag hydroxide-simeth (MAALOX/MYLANTA) 200-200-20 MG/5ML suspension 30 mL  30 mL Oral Q4H PRN Starkes-Perry, Gayland Curry, FNP      . diphenhydrAMINE (BENADRYL) capsule 50 mg  50 mg Oral QHS PRN Clapacs, John T, MD      . divalproex (DEPAKOTE ER) 24 hr tablet 1,000 mg  1,000 mg Oral BID Clapacs, Madie Reno, MD   1,000 mg at 02/07/19 0818  . DULoxetine (CYMBALTA) DR capsule 30 mg  30 mg Oral Daily Clapacs, Madie Reno, MD   30 mg at 02/07/19 0819  . folic acid (FOLVITE) tablet 1 mg  1 mg Oral Daily Clapacs, John T, MD   1 mg at 02/07/19 0819  . hydrOXYzine (ATARAX/VISTARIL) tablet 25 mg  25 mg Oral TID PRN Suella Broad, FNP   25 mg at 02/05/19 2157  . influenza vaccine adjuvanted (FLUAD) injection 0.5 mL  0.5 mL Intramuscular Tomorrow-1000 Clapacs, John T, MD      . lisinopril (ZESTRIL) tablet 10 mg  10 mg Oral Daily Clapacs, Madie Reno, MD   10 mg at 02/07/19 0818  . magnesium hydroxide (MILK OF MAGNESIA) suspension 30 mL  30 mL Oral Daily PRN Suella Broad, FNP      . multivitamin with minerals tablet 1 tablet  1 tablet Oral Daily Clapacs, Madie Reno, MD   1 tablet at 02/07/19 0818  . naproxen (NAPROSYN) tablet 500 mg  500 mg Oral BID PRN Clapacs, Madie Reno, MD   500 mg at 02/06/19 1719  . pravastatin (PRAVACHOL) tablet 80 mg  80 mg Oral q1800  Clapacs, Madie Reno, MD   80 mg at 02/06/19 1720  . thiamine (VITAMIN B-1) tablet 100 mg  100 mg Oral Daily Clapacs, John T, MD   100 mg at 02/07/19 0818   Or  . thiamine (B-1) injection 100 mg  100 mg Intravenous Daily Clapacs, Madie Reno, MD       PTA Medications: Medications Prior to Admission  Medication Sig Dispense Refill Last Dose  . clobetasol cream (TEMOVATE) [id number] % Apply 1 application topically 2 (two) times daily.     . DULoxetine (CYMBALTA) 30 MG capsule Take 30 mg by mouth daily.      . hydrOXYzine (ATARAX/VISTARIL) 10 MG tablet Take 1 tablet (10 mg total) by mouth 3 (three) times daily as needed. 12 tablet 0   . ibuprofen (ADVIL,MOTRIN) 800 MG tablet Take 800 mg by mouth every 8 (eight) hours as needed.     . nystatin ointment (MYCOSTATIN) Apply 1 application topically 2 (two) times daily.     Marland Kitchen tolnaftate (TINACTIN) 1 % powder Apply 1 application topically 2 (two) times daily.     . [DISCONTINUED] divalproex (DEPAKOTE ER) 500 MG 24 hr tablet Take 1,000 mg by mouth 2 (two) times daily.      . [DISCONTINUED] lisinopril (PRINIVIL,ZESTRIL) 10 MG tablet Take 10 mg by mouth daily.     . [  DISCONTINUED] pravastatin (PRAVACHOL) 80 MG tablet Take 80 mg by mouth at bedtime.        Patient Stressors: Health problems Medication change or noncompliance Substance abuse  Patient Strengths: Ability for insight Average or above average intelligence Capable of independent living Communication skills  Treatment Modalities: Medication Management, Group therapy, Case management,  1 to 1 session with clinician, Psychoeducation, Recreational therapy.   Physician Treatment Plan for Primary Diagnosis: Alcohol-induced mood disorder ([place]) Long Term Goal(s): Improvement in symptoms so as ready for discharge Improvement in symptoms so as ready for discharge   Short Term Goals: Ability to demonstrate self-control will improve Ability to maintain clinical measurements within normal limits will  improve Compliance with prescribed medications will improve  Medication Management: Evaluate patient's response, side effects, and tolerance of medication regimen.  Therapeutic Interventions: 1 to 1 sessions, Unit Group sessions and Medication administration.  Evaluation of Outcomes: Adequate for Discharge  Physician Treatment Plan for Secondary Diagnosis: Principal Problem:   Alcohol-induced mood disorder ([place]) Active Problems:   PTSD (post-traumatic stress disorder)   Alcohol dependence with intoxication, uncomplicated ([place])   Essential hypertension  Long Term Goal(s): Improvement in symptoms so as ready for discharge Improvement in symptoms so as ready for discharge   Short Term Goals: Ability to demonstrate self-control will improve Ability to maintain clinical measurements within normal limits will improve Compliance with prescribed medications will improve     Medication Management: Evaluate patient's response, side effects, and tolerance of medication regimen.  Therapeutic Interventions: 1 to 1 sessions, Unit Group sessions and Medication administration.  Evaluation of Outcomes: Adequate for Discharge   RN Treatment Plan for Primary Diagnosis: Alcohol-induced mood disorder ([place]) Long Term Goal(s): Knowledge of disease and therapeutic regimen to maintain health will improve  Short Term Goals: Ability to demonstrate self-control, Ability to identify and develop effective coping behaviors will improve and Compliance with prescribed medications will improve  Medication Management: RN will administer medications as ordered by provider, will assess and evaluate patient's response and provide education to patient for prescribed medication. RN will report any adverse and/or side effects to prescribing provider.  Therapeutic Interventions: 1 on 1 counseling sessions, Psychoeducation, Medication administration, Evaluate responses to treatment, Monitor vital signs and CBGs as  ordered, Perform/monitor CIWA, COWS, AIMS and Fall Risk screenings as ordered, Perform wound care treatments as ordered.  Evaluation of Outcomes: Adequate for Discharge   LCSW Treatment Plan for Primary Diagnosis: Alcohol-induced mood disorder ([place]) Long Term Goal(s): Safe transition to appropriate next level of care at discharge, Engage patient in therapeutic group addressing interpersonal concerns.  Short Term Goals: Engage patient in aftercare planning with referrals and resources, Increase social support and Increase skills for wellness and recovery  Therapeutic Interventions: Assess for all discharge needs, 1 to 1 time with Social worker, Explore available resources and support systems, Assess for adequacy in community support network, Educate family and significant other(s) on suicide prevention, Complete Psychosocial Assessment, Interpersonal group therapy.  Evaluation of Outcomes: Adequate for Discharge   Progress in Treatment: Attending groups: Yes. Participating in groups: No. Taking medication as prescribed: Yes. Toleration medication: Yes. Family/Significant other contact made: No, will contact:  pt declined Patient understands diagnosis: No. Discussing patient identified problems/goals with staff: Yes. Medical problems stabilized or resolved: Yes. Denies suicidal/homicidal ideation: Yes. Issues/concerns per patient self-inventory: No. Other: N/A  New problem(s) identified: No, Describe:  none  New Short Term/Long Term Goal(s): Detox, elimination of AVH/symptoms of psychosis, medication management for mood stabilization; elimination of SI  thoughts; development of comprehensive mental wellness/sobriety plan.   Patient Goals:  "Have some sober time"  Discharge Plan or Barriers: SPE pamphlet, Mobile Crisis information, and AA/NA information provided to patient for additional community support and resources. Pt will be [facility] with the [facility].  Reason for  Continuation of Hospitalization: none  Estimated Length of Stay: Today 02/07/2019  Attendees: Patient: Omar Davis 02/07/2019 10:00 AM  Physician: Dr Weber Cooks MD 02/07/2019 10:00 AM  Nursing: Nat Math Ravenell 02/07/2019 10:00 AM  RN Care Manager: 02/07/2019 10:00 AM  Social Worker: Minette Brine Jozlin Bently LCSW 02/07/2019 10:00 AM  Recreational Therapist: Roanna Epley CTRS LRT 02/07/2019 10:00 AM  Other: Assunta Curtis LCSW 02/07/2019 10:00 AM  Other: Sanjuana Kava LCSW 02/07/2019 10:00 AM  Other: 02/07/2019 10:00 AM    Scribe for Treatment Team: Mariann Laster Kashus Karlen, LCSW 02/07/2019 10:00 AM

## 2019-02-07 NOTE — Progress Notes (Signed)
Patient was has been sleeping. Did not participate in evening activities.  Slept throughout the night and had no sign of distress. Safety monitored as recommended.

## 2019-02-07 NOTE — Progress Notes (Signed)
Omar Davis is a 68 y.o. male patient is seen today in the ED after been brought in by [employer] from a hotel.  Per ED triage nursing notes, the patient was found yelling, "[facility]."  The patient was intoxicated, swinging at the police officers, making the officer place him in bilateral forensic restraints.  This provider attempted to provide the patient with an initial psychiatric assessment; the patient remains intoxicated and unable to participate in the assessment process.  The patient will be assessed at a later time.  The patient was discharged on 02/07/2019 at 1:45 PM from the inpatient unit by Dr. Weber Cooks.  He was then brought back to the ED at 4:39 PM by [facility] Department due to him being intoxicated and verbalizing wanting the police to shoot him.

## 2019-02-07 NOTE — ED Notes (Signed)
Assisted pt with the urinal. 

## 2019-02-07 NOTE — ED Notes (Signed)
Pt allowed to urinate into urinal and linens and bed cleaned and changed. Diaper placed on pt as precaution. Pt given fresh warm blankets. All questions answered. Pt states "please don;t take me to [facility]. They always want to screw me over. I hope I die." Pt requesting staff to ensure he dies. Advised pt we are trying to help and are going to keep him with [place] at least until his BP is stable.

## 2019-02-07 NOTE — ED Provider Notes (Signed)
[facility] Emergency Department Provider Note  ____________________________________________   First MD Initiated Contact with Patient 02/07/19 1644     (approximate)  I have reviewed the triage vital signs and the nursing notes.   HISTORY  Chief Complaint Alcohol Intoxication    HPI Omar Davis is a 68 y.o. male with depression, hypertension, PTSD, TBI, alcohol abuse who presents to the ED for alcohol abuse.  Patient was just discharged from the emergency department this morning.  He went to a hotel where he started to drink alcohol.  He was drinking vodka.  Patient's pills were by him but unclear if he took any of these. Patient appears intoxicated and is yelling.  He appears to be having some visual hallucinations as well.  Unable to get full HPI due to psychiatric illness.  The police did IVC patient.  Per the police he called the police and that he was really depressed and nobody cared about him about 1 police got there he was trying at the police to shoot him.    Past Medical History:  Diagnosis Date  . Chronic pain   . Depressed   . Hypertension   . PTSD (post-traumatic stress disorder)   . TBI (traumatic brain injury) [facility])     Patient Active Problem List   Diagnosis Date Noted  . Essential hypertension 02/05/2019  . Alcohol-induced mood disorder ([place]) 01/24/2019  . Alcohol dependence with intoxication, uncomplicated ([place]) [id number]  . Major depressive disorder, recurrent severe without psychotic features ([place]) 08/24/2018  . Suicidal ideation 09/25/2015  . Alcohol abuse 09/09/2015  . PTSD (post-traumatic stress disorder) 09/09/2015  . Agitation 09/09/2015  . Depressed   . Chronic pain     Past Surgical History:  Procedure Laterality Date  . BACK SURGERY    . HERNIA REPAIR    . KNEE SURGERY    . KNEE SURGERY    . prostectomy N/A     Prior to Admission medications   Medication Sig Start Date End Date Taking? Authorizing  Provider  divalproex (DEPAKOTE ER) 500 MG 24 hr tablet Take 2 tablets (1,000 mg total) by mouth 2 (two) times daily. 02/07/19   Clapacs, Madie Reno, MD  DULoxetine (CYMBALTA) 30 MG capsule Take 1 capsule (30 mg total) by mouth daily. 02/07/19   Clapacs, Madie Reno, MD  lisinopril (ZESTRIL) 10 MG tablet Take 1 tablet (10 mg total) by mouth daily. 02/07/19   Clapacs, Madie Reno, MD  naproxen (NAPROSYN) 500 MG tablet Take 1 tablet (500 mg total) by mouth 2 (two) times daily as needed for moderate pain. 02/07/19   Clapacs, Madie Reno, MD  pravastatin (PRAVACHOL) 80 MG tablet Take 1 tablet (80 mg total) by mouth at bedtime. 02/07/19   Clapacs, Madie Reno, MD  thiamine 100 MG tablet Take 1 tablet (100 mg total) by mouth daily. 02/07/19   Clapacs, Madie Reno, MD    Allergies Codeine, Gabapentin, Penicillins, and Lisinopril  No family history on file.  Social History Social History   Tobacco Use  . Smoking status: Never Smoker  . Smokeless tobacco: Never Used  Substance Use Topics  . Alcohol use: Yes    Comment: 1 pint of vodka  . Drug use: No      Review of Systems Unable to get full review of systems due to psychiatric illness ____________________________________________   PHYSICAL EXAM:  VITAL SIGNS: Blood pressure (!) 74/43, pulse 86, temperature 98.2 F (36.8 C), temperature source Oral, resp. rate (!) 22,  height 5\' 6"  (1.676 m), weight 77 kg, SpO2 96 %.  Constitutional: Agitated and yelling Eyes: Conjunctivae are normal. EOMI. Head: Atraumatic. Nose: No congestion/rhinnorhea. Mouth/Throat: Mucous membranes are moist.   Neck: No stridor. Trachea Midline. FROM Cardiovascular: Normal rate, regular rhythm. Grossly normal heart sounds.  Good peripheral circulation. Respiratory: Normal respiratory effort.  No retractions. Lungs CTAB. Gastrointestinal: Soft and nontender. No distention. No abdominal bruits.  Musculoskeletal: No lower extremity tenderness nor edema.  No joint effusions. Neurologic: Slurred  speech, moving all extremities well Skin:  Skin is warm, dry and intact. No rash noted. Psychiatric: Appears intoxicated and yelling and screaming GU: Deferred   ____________________________________________   LABS (all labs ordered are listed, but only abnormal results are displayed)  Labs Reviewed  CBC WITH DIFFERENTIAL/PLATELET - Abnormal; Notable for the following components:      Result Value   WBC 13.0 (*)    RBC 5.96 (*)    Hemoglobin 11.8 (*)    MCV 66.4 (*)    MCH 19.8 (*)    MCHC 29.8 (*)    RDW 22.8 (*)    Neutro Abs 10.7 (*)    Abs Immature Granulocytes 0.11 (*)    All other components within normal limits  COMPREHENSIVE METABOLIC PANEL - Abnormal; Notable for the following components:   Sodium 134 (*)    CO2 17 (*)    BUN 40 (*)    Creatinine, Ser 1.27 (*)    GFR calc non Af Amer 57 (*)    All other components within normal limits  ACETAMINOPHEN LEVEL - Abnormal; Notable for the following components:   Acetaminophen (Tylenol), Serum <10 (*)    All other components within normal limits  ETHANOL - Abnormal; Notable for the following components:   Alcohol, Ethyl (B) 217 (*)    All other components within normal limits  SALICYLATE LEVEL  GLUCOSE, CAPILLARY  VALPROIC ACID LEVEL  URINE DRUG SCREEN, QUALITATIVE ([facility] ONLY)  URINALYSIS, ROUTINE W REFLEX MICROSCOPIC   ____________________________________________   ED ECG REPORT I, Vanessa [place], the attending physician, personally viewed and interpreted this ECG.  EKG is sinus rate of 66, no ST elevation, no T wave inversion, normal intervals ____________________________________________  RADIOLOGY Robert Bellow, personally viewed and evaluated these images (plain radiographs) as part of my medical decision making, as well as reviewing the written report by the radiologist.  ED MD interpretation:  No PNA   Official radiology report(s): Dg Chest Portable 1 View  Result Date: 02/07/2019 CLINICAL DATA:   Shortness of breath EXAM: PORTABLE CHEST 1 VIEW COMPARISON:  Radiograph January 16, 2019 FINDINGS: No consolidation, features of edema, pneumothorax, or effusion. Pulmonary vascularity is normally distributed. The cardiomediastinal contours are unremarkable. No acute osseous or soft tissue abnormality. Degenerative changes are present in the imaged spine and shoulders. 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 IMPRESSION: No acute cardiopulmonary process. Electronically Signed   By: Lovena Le M.D.   On: 02/07/2019 19:58    ____________________________________________   PROCEDURES  Procedure(s) performed (including Critical Care):  .Critical Care Performed by: Vanessa Naalehu, MD Authorized by: Vanessa [place], MD   Critical care provider statement:    Critical care time (minutes):  30   Critical care was necessary to treat or prevent imminent or life-threatening deterioration of the following conditions:  [place]   Critical care was time spent personally by me on the following activities:  Discussions with consultants, evaluation  of patient's response to treatment, examination of patient, ordering and performing treatments and interventions, ordering and review of laboratory studies, ordering and review of radiographic studies, pulse oximetry, re-evaluation of patient's condition, obtaining history from patient or surrogate and review of old charts     ____________________________________________   INITIAL IMPRESSION / [place] / ED COURSE  Omar Davis was evaluated in Emergency Department on 02/07/2019 for the symptoms described in the history of present illness. He was evaluated in the context of the global COVID-19 pandemic, which necessitated consideration that the patient might be at risk for infection with the SARS-CoV-2 virus that causes COVID-19. Institutional protocols and algorithms that pertain to the  evaluation of patients at risk for COVID-19 are in a state of rapid change based on information released by regulatory bodies including the CDC and federal and state organizations. These policies and algorithms were followed during the patient's care in the ED.    Patient presents with alcohol intoxication and depression.  It sounds like patient was trying at the police to shoot him.  There is potential that he took some of his home medications with his alcohol but unclear.  Will get labs to evaluate for aspirin or Tylenol overdose.  Given how agitated patient is will give a dose of Geodon and closely monitor patient.  Patient is already IVC by the police.  5:53 PM patient more somnolent now the blood pressures are lower. We will give a liter fluid and reassess. Glucose normal.   White count is elevated at 13.  Given this low blood pressure secondary to infection but I have low suspicion given patient is afebrile.  Will add on urine and chest x-ray to further evaluate.  Kidney function is slightly elevated which could be a sign of some dehydration since patient is only drinking alcohol.  Ethanol level was 217.  No evidence that patient overdosed on his valproic acid.  8:06 PM reevaluated patient.  Patient endorses feeling better.  He is moving all extremities.  He denies any other infectious symptoms.  9:25 PM pressures are better  Pt is without any acute medical complaints. No exam findings to suggest medical cause of current presentation. Will order psychiatric screening labs and discuss further w/ psychiatric service.  D/d includes but is not limited to psychiatric disease, behavioral/personality disorder, inadequate socioeconomic support, medical.  Based on HPI, exam, unremarkable labs, no concern for acute medical problem at this time. No rigidity, clonus, hyperthermia, focal neurologic deficit, diaphoresis, tachycardia, meningismus, ataxia, gait abnormality or other finding to suggest this  visit represents a non-psychiatric problem. Screening labs reviewed.    Given this, pt medically cleared, to be dispositioned per Psych.    ____________________________________________   FINAL CLINICAL IMPRESSION(S) / ED DIAGNOSES   Final diagnoses:  Alcoholic intoxication without complication ([place])      MEDICATIONS GIVEN DURING THIS VISIT:  Medications  ziprasidone (GEODON) injection 20 mg (20 mg Intramuscular Given 02/07/19 1700)  sodium chloride 0.9 % bolus 1,000 mL (0 mLs Intravenous Stopped 02/07/19 1951)  sodium chloride 0.9 % bolus 1,000 mL (1,000 mLs Intravenous New Bag/Given 02/07/19 1952)     ED Discharge Orders    None       Note:  This document was prepared using Dragon voice recognition software and may include unintentional dictation errors.   Vanessa Sedgwick, MD 02/07/19 2125

## 2019-02-07 NOTE — ED Notes (Signed)
Patient assigned to appropriate care area   Introduced self to pt  Patient oriented to unit/care area: Informed that, for their safety, care areas are designed for safety and visiting and phone hours explained to patient. Patient verbalizes understanding, and verbal contract for safety obtained  Environment secured  Pt talking loudly  - appears intoxicated   Pt to ER with [employer] from a hotel. Pt yelling, "[facility], just [facility]". Pt clearly intoxicated. Swinging at [employer] officer, bilateral forensic restrains on patient. Pt unable to be vitalized due to behavior. Taken to room 23 via wheelchair by [employer] and Jonni Sanger, ED Tech. Primary RN aware.

## 2019-02-07 NOTE — ED Notes (Addendum)
Pt given food tray by this tech. Pt to lethargic at this time.

## 2019-02-07 NOTE — Progress Notes (Signed)
Recreation Therapy Notes  Date: 02/07/2019  Time: 9:30 am   Location: Craft room   Behavioral response: N/A   Intervention Topic: Problem Solving  Discussion/Intervention: Patient did not attend group.   Clinical Observations/Feedback:  Patient did not attend group.   Omar Davis LRT/CTRS        [name] 02/07/2019 10:42 AM

## 2019-02-07 NOTE — ED Notes (Signed)
Pt repositioned in bed. Pt wakes with verbal stimuli. Pt is able to assist in rolling over onto his back. BP recheck is improved. Charge RN and Dr Beather Arbour to the bedside. Fluids infusing. Will continue to monitor.

## 2019-02-07 NOTE — Progress Notes (Signed)
  [facility] Adult Case Management Discharge Plan :  Will you be returning to the same living situation after discharge:  No. At discharge, do you have transportation home?: Yes,  taxi will be provided Do you have the ability to pay for your medications: Yes,  [employer] Medicare  Release of information consent forms completed and in the chart;   Patient to Follow up at: [facility] Follow up on 02/20/2019.   Specialty: General Practice Why: You are scheduled for a telephone appointment on Wednesday, September 30th at 2:00pm. Contact information: [place] [address] [phone]           Next level of care provider has access to [place] and Suicide Prevention discussed: Yes,  SPE completed with pt as pt declined collateral contact  Have you used any form of tobacco in the last 30 days? (Cigarettes, Smokeless Tobacco, Cigars, and/or Pipes): No  Has patient been referred to the Quitline?: N/A patient is not a smoker  Patient has been referred for addiction treatment: N/A  Delfin Edis, LCSW 02/07/2019, 10:23 AM

## 2019-02-07 NOTE — [facility] Suicide Risk Assessment (Signed)
[facility] Discharge Suicide Risk Assessment   Principal Problem: Alcohol-induced mood disorder ([place]) Discharge Diagnoses: Principal Problem:   Alcohol-induced mood disorder ([place]) Active Problems:   PTSD (post-traumatic stress disorder)   Alcohol dependence with intoxication, uncomplicated ([place])   Essential hypertension   Total Time spent with patient: 45 minutes  Musculoskeletal: Strength & Muscle Tone: within normal limits Gait & Station: normal Patient leans: N/A  Psychiatric Specialty Exam: Review of Systems  Constitutional: Negative.   HENT: Negative.   Eyes: Negative.   Respiratory: Negative.   Cardiovascular: Negative.   Gastrointestinal: Negative.   Musculoskeletal: Negative.   Skin: Negative.   Neurological: Negative.   Psychiatric/Behavioral: Negative.     Blood pressure 127/84, pulse 81, temperature 97.9 F (36.6 C), temperature source Oral, resp. rate 16, height 5\' 8"  (1.727 m), weight 76.7 kg, SpO2 100 %.Body mass index is 25.7 kg/m.  General Appearance: Casual  Eye Contact::  Fair  Speech:  Clear and [id number]  Volume:  Normal  Mood:  Euthymic  Affect:  Congruent  Thought Process:  Goal Directed  Orientation:  Full (Time, Place, and Person)  Thought Content:  Logical  Suicidal Thoughts:  No  Homicidal Thoughts:  No  Memory:  Immediate;   Fair Recent;   Fair Remote;   Fair  Judgement:  Fair  Insight:  Fair  Psychomotor Activity:  Normal  Concentration:  Fair  Recall:  [employer] of Knowledge:Fair  Language: Fair  Akathisia:  No  Handed:  Right  AIMS (if indicated):     Assets:  Desire for Improvement  Sleep:  Number of Hours: 8.5  Cognition: WNL  ADL's:  Intact   Mental Status Per Nursing Assessment::   On Admission:  NA  Demographic Factors:  Age 70 or older, Caucasian and Living alone  Loss Factors: NA  Historical Factors: Impulsivity  Risk Reduction Factors:   Positive therapeutic relationship  Continued Clinical Symptoms:   Alcohol/Substance Abuse/Dependencies  Cognitive Features That Contribute To Risk:  None    Suicide Risk:  Minimal: No identifiable suicidal ideation.  Patients presenting with no risk factors but with morbid ruminations; may be classified as minimal risk based on the severity of the depressive symptoms  [place] Follow up on 02/20/2019.   Specialty: General Practice Why: You are scheduled for a telephone appointment on Wednesday, September 30th at 2:00pm. Contact information: [place] [id number] [phone]           Plan Of Care/Follow-up recommendations:  Activity:  Activity as tolerated Diet:  Regular diet Other:  Patient has outpatient follow-up for his medical and psychiatric needs through the [facility].  He is well aware of the dangers to his health from ongoing substance abuse and of the assistance that is available through the [place] system.  Alethia Berthold, MD 02/07/2019, 11:25 AM

## 2019-02-07 NOTE — Plan of Care (Signed)
Stayed in bed sleeping.

## 2019-02-07 NOTE — ED Notes (Signed)
Assisted pt with urinal at this time.  

## 2019-02-07 NOTE — Discharge Summary (Signed)
Physician Discharge Summary Note  Patient:  Omar Davis is an 68 y.o., male MRN:  [id number] DOB:  Jun 14, 1950 Patient phone:  [phone] (home)  Patient address:   [address] [place] [address],  Total Time spent with patient: 45 minutes  Date of Admission:  02/05/2019 Date of Discharge: February 07, 2019  Reason for Admission: Admitted through the emergency room after being brought in for making a disturbance while intoxicated at a local motel.  Patient was alleged to have made suicidal comments.  As he himself later said "Ill saying anything when I am drunk".  Principal Problem: Alcohol-induced mood disorder [facility]) Discharge Diagnoses: Principal Problem:   Alcohol-induced mood disorder ([place]) Active Problems:   PTSD (post-traumatic stress disorder)   Alcohol dependence with intoxication, uncomplicated ([place])   Essential hypertension   Past Psychiatric History: Patient has a longstanding history of alcohol abuse and a history of PTSD.  Followed at the [facility].  No recent self injury or suicide.  Patient is experienced with substance abuse treatment has had extended sobriety at times in the past but also a lot of extended binges or periods of using.  No history known of psychosis or mania.  Past Medical History:  Past Medical History:  Diagnosis Date  . Chronic pain   . Depressed   . Hypertension   . PTSD (post-traumatic stress disorder)   . TBI (traumatic brain injury) [facility])     Past Surgical History:  Procedure Laterality Date  . BACK SURGERY    . HERNIA REPAIR    . KNEE SURGERY    . KNEE SURGERY    . prostectomy N/A    Family History: History reviewed. No pertinent family history. Family Psychiatric  History: None reported Social History:  Social History   Substance and Sexual Activity  Alcohol Use Yes   Comment: 1 pint of vodka     Social History   Substance and Sexual Activity  Drug Use No    Social History   Socioeconomic History  .  Marital status: Legally Separated    Spouse name: Not on file  . Number of children: Not on file  . Years of education: Not on file  . Highest education level: Not on file  Occupational History  . Not on file  Social Needs  . Financial resource strain: Not on file  . Food insecurity    Worry: Not on file    Inability: Not on file  . Transportation needs    Medical: Not on file    Non-medical: Not on file  Tobacco Use  . Smoking status: Never Smoker  . Smokeless tobacco: Never Used  Substance and Sexual Activity  . Alcohol use: Yes    Comment: 1 pint of vodka  . Drug use: No  . Sexual activity: Not Currently  Lifestyle  . Physical activity    Days per week: Not on file    Minutes per session: Not on file  . Stress: Not on file  Relationships  . Social Herbalist on phone: Not on file    Gets together: Not on file    Attends religious service: Not on file    Active member of club or organization: Not on file    Attends meetings of clubs or organizations: Not on file    Relationship status: Not on file  Other Topics Concern  . Not on file  Social History Narrative  . Not on file  Hospital Course: Admitted to the psychiatric ward.  Detox protocol was provided but the patient really did not need any significant medical treatment for alcohol withdrawal.  Vitals remained stable.  He was slightly shaky for about half a day and then became stable.  Patient was treated with his outpatient psychiatric medicine including Cymbalta and Depakote which is also I believe prescribed for his past history of seizures.  Tolerated medicines well.  No complaints physically during his time in the hospital other than his knee pain which was effectively treated with Naprosyn.  Patient was not aggressive dangerous or threatening and was completely cooperative throughout his time in the hospital.  Did not appear to need further inpatient psychiatric treatment.  He is followed at the [facility] and is aware of the services available.  Encouragement provided for him to think about making some changes and get back into substance abuse treatment.  Patient has enough financial support of his own that he can afford to find a new motel which is his discharge plan.  Physical Findings: AIMS:  , ,  ,  ,    CIWA:  CIWA-Ar Total: 0 COWS:     Musculoskeletal: Strength & Muscle Tone: within normal limits Gait & Station: normal Patient leans: N/A  Psychiatric Specialty Exam: Physical Exam  Nursing note and vitals reviewed. Constitutional: He appears well-developed and well-nourished.  HENT:  Head: Normocephalic and atraumatic.  Eyes: Pupils are equal, round, and reactive to light. Conjunctivae are normal.  Neck: Normal range of motion.  Cardiovascular: Regular rhythm and normal heart sounds.  Respiratory: Effort normal. No respiratory distress.  GI: Soft.  Musculoskeletal: Normal range of motion.  Neurological: He is alert.  Skin: Skin is warm and dry.  Psychiatric: He has a normal mood and affect. His behavior is normal. Judgment and thought content normal.    Review of Systems  Constitutional: Negative.   HENT: Negative.   Eyes: Negative.   Respiratory: Negative.   Cardiovascular: Negative.   Gastrointestinal: Negative.   Musculoskeletal: Negative.   Skin: Negative.   Neurological: Negative.   Psychiatric/Behavioral: Positive for substance abuse. Negative for depression and suicidal ideas.    Blood pressure 127/84, pulse 81, temperature 97.9 F (36.6 C), temperature source Oral, resp. rate 16, height 5\' 8"  (1.727 m), weight 76.7 kg, SpO2 100 %.Body mass index is 25.7 kg/m.  General Appearance: Casual  Eye Contact:  Good  Speech:  Clear and Coherent  Volume:  Normal  Mood:  Euthymic  Affect:  Congruent  Thought Process:  Goal Directed  Orientation:  Full (Time, Place, and Person)  Thought Content:  Logical  Suicidal Thoughts:  No  Homicidal Thoughts:  No  Memory:   Immediate;   Fair Recent;   Fair Remote;   Fair  Judgement:  Fair  Insight:  Fair  Psychomotor Activity:  Normal  Concentration:  Concentration: Fair  Recall:  [employer] of Knowledge:  Fair  Language:  Fair  Akathisia:  No  Handed:  Right  AIMS (if indicated):     Assets:  Desire for Improvement Housing Resilience Social Support  ADL's:  Intact  Cognition:  WNL  Sleep:  Number of Hours: 8.5     Have you used any form of tobacco in the last 30 days? (Cigarettes, Smokeless Tobacco, Cigars, and/or Pipes): No  Has this patient used any form of tobacco in the last 30 days? (Cigarettes, Smokeless Tobacco, Cigars, and/or Pipes) Yes, No  Blood Alcohol level:  Lab  Results  Component Value Date   ETH 281 (H) 02/03/2019   ETH 276 (H) [id number]    Metabolic Disorder Labs:  Lab Results  Component Value Date   HGBA1C 5.4 02/06/2019   MPG 108 02/06/2019   No results found for: PROLACTIN Lab Results  Component Value Date   CHOL 218 (H) 02/06/2019   TRIG 139 02/06/2019   HDL 65 02/06/2019   CHOLHDL 3.4 02/06/2019   VLDL 28 02/06/2019   LDLCALC 125 (H) 02/06/2019    See Psychiatric Specialty Exam and Suicide Risk Assessment completed by Attending Physician prior to discharge.  Discharge destination:  Home  Is patient on multiple antipsychotic therapies at discharge:  No   Has Patient had three or more failed trials of antipsychotic monotherapy by history:  No  Recommended Plan for Multiple Antipsychotic Therapies: NA  Discharge Instructions    Diet - low sodium heart healthy   Complete by: As directed    Increase activity slowly   Complete by: As directed      Allergies as of 02/07/2019      Reactions   Codeine Hives, Rash   Gabapentin Other (See Comments), Anaphylaxis   seizures Other reaction(s): Other (See Comments) seizures Anaphylactic Shock   Penicillins Rash   Other reaction(s): Other (See Comments) Other Reaction: Not Assessed Rash   Lisinopril  Swelling   Pt reports lip swelling      Medication List    STOP taking these medications   clobetasol cream 0.05 % Commonly known as: TEMOVATE   hydrOXYzine 10 MG tablet Commonly known as: ATARAX/VISTARIL   ibuprofen 800 MG tablet Commonly known as: ADVIL   nystatin ointment Commonly known as: MYCOSTATIN   tolnaftate 1 % powder Commonly known as: TINACTIN     TAKE these medications     Indication  divalproex 500 MG 24 hr tablet Commonly known as: DEPAKOTE ER Take 2 tablets (1,000 mg total) by mouth 2 (two) times daily.  Indication: FOCAL SEIZURE WITH IMPAIRED AWARENESS   DULoxetine 30 MG capsule Commonly known as: CYMBALTA Take 1 capsule (30 mg total) by mouth daily.  Indication: Major Depressive Disorder   lisinopril 10 MG tablet Commonly known as: ZESTRIL Take 1 tablet (10 mg total) by mouth daily.  Indication: High Blood Pressure Disorder   naproxen 500 MG tablet Commonly known as: NAPROSYN Take 1 tablet (500 mg total) by mouth 2 (two) times daily as needed for moderate pain.  Indication: Nonspecific Inflammation of Tendon Sheath, Joint Damage causing Pain and Loss of Function   pravastatin 80 MG tablet Commonly known as: PRAVACHOL Take 1 tablet (80 mg total) by mouth at bedtime.  Indication: High Amount of Triglycerides in the Blood   thiamine 100 MG tablet Take 1 tablet (100 mg total) by mouth daily.  Indication: Loss of Appetite      Follow-up [facility] Follow up on 02/20/2019.   Specialty: General Practice Why: You are scheduled for a telephone appointment on Wednesday, September 30th at 2:00pm. Contact information: [place] [id number] [phone]           Follow-up recommendations:  Activity:  Activity as tolerated Diet:  Regular diet Other:  Follow-up with outpatient care through the [facility] system  Comments: Prescriptions and supply provided at discharge.  Signed: Alethia Berthold, MD 02/07/2019,  11:33 AM

## 2019-02-07 NOTE — ED Notes (Signed)
This Rn in with xray. Pt is sleeping and coopeartive at this time.

## 2019-02-07 NOTE — ED Notes (Signed)
Pt attempting to get out of bed at this time. Sherie, RN notified.

## 2019-02-07 NOTE — Progress Notes (Signed)
Patient denies SI/HI, denies A/V hallucinations. Patient verbalizes understanding of discharge instructions, follow up care and prescriptions.7 days medicines given to patient. Patient given all belongings from [facility] locker. Patient escorted out by staff, transported by cab. 

## 2019-02-07 NOTE — ED Notes (Signed)
Pt attempting to get out of bed; stating he wants to leave back to his apartment [place], [place] notified.

## 2019-02-07 NOTE — ED Notes (Signed)
Pt attempting to get out of bed. Sherie, RN notified.

## 2019-02-07 NOTE — ED Triage Notes (Signed)
Pt to ER with [employer] from a hotel. Pt yelling, "damn [facility], just kill me". Pt clearly intoxicated. Swinging at [employer] officer, bilateral forensic restrains on patient. Pt unable to be vitalized due to behavior. Taken to room 23 via wheelchair by [employer] and Jonni Sanger, ED Tech. Primary RN aware.

## 2019-02-07 NOTE — ED Notes (Signed)
Patient appeared to be in a daze and was not responding to staff, when asked a question he just grunted. Breathing was labored, patients eyes were glassed and watery. MD Jari Pigg informed and asked to assess.

## 2019-02-08 DIAGNOSIS — F332 Major depressive disorder, recurrent severe without psychotic features: Secondary | ICD-10-CM | POA: Diagnosis not present

## 2019-02-08 DIAGNOSIS — G8929 Other chronic pain: Secondary | ICD-10-CM | POA: Diagnosis present

## 2019-02-08 DIAGNOSIS — E871 Hypo-osmolality and hyponatremia: Secondary | ICD-10-CM | POA: Diagnosis present

## 2019-02-08 DIAGNOSIS — F10920 Alcohol use, unspecified with intoxication, uncomplicated: Secondary | ICD-10-CM | POA: Diagnosis present

## 2019-02-08 DIAGNOSIS — Z885 Allergy status to narcotic agent status: Secondary | ICD-10-CM | POA: Diagnosis not present

## 2019-02-08 DIAGNOSIS — N179 Acute kidney failure, unspecified: Secondary | ICD-10-CM | POA: Diagnosis present

## 2019-02-08 DIAGNOSIS — I10 Essential (primary) hypertension: Secondary | ICD-10-CM | POA: Diagnosis present

## 2019-02-08 DIAGNOSIS — F1094 Alcohol use, unspecified with alcohol-induced mood disorder: Secondary | ICD-10-CM

## 2019-02-08 DIAGNOSIS — F4310 Post-traumatic stress disorder, unspecified: Secondary | ICD-10-CM | POA: Diagnosis present

## 2019-02-08 DIAGNOSIS — F10220 Alcohol dependence with intoxication, uncomplicated: Secondary | ICD-10-CM | POA: Diagnosis present

## 2019-02-08 DIAGNOSIS — Z880 Allergy status to penicillin: Secondary | ICD-10-CM | POA: Diagnosis not present

## 2019-02-08 DIAGNOSIS — I959 Hypotension, unspecified: Secondary | ICD-10-CM | POA: Diagnosis present

## 2019-02-08 DIAGNOSIS — Z79899 Other long term (current) drug therapy: Secondary | ICD-10-CM | POA: Diagnosis not present

## 2019-02-08 DIAGNOSIS — F329 Major depressive disorder, single episode, unspecified: Secondary | ICD-10-CM | POA: Diagnosis present

## 2019-02-08 DIAGNOSIS — Z888 Allergy status to other drugs, medicaments and biological substances status: Secondary | ICD-10-CM | POA: Diagnosis not present

## 2019-02-08 DIAGNOSIS — Z20828 Contact with and (suspected) exposure to other viral communicable diseases: Secondary | ICD-10-CM | POA: Diagnosis present

## 2019-02-08 DIAGNOSIS — R451 Restlessness and agitation: Secondary | ICD-10-CM | POA: Diagnosis present

## 2019-02-08 DIAGNOSIS — Z87820 Personal history of traumatic brain injury: Secondary | ICD-10-CM | POA: Diagnosis not present

## 2019-02-08 DIAGNOSIS — G40909 Epilepsy, unspecified, not intractable, without status epilepticus: Secondary | ICD-10-CM | POA: Diagnosis present

## 2019-02-08 DIAGNOSIS — F339 Major depressive disorder, recurrent, unspecified: Secondary | ICD-10-CM

## 2019-02-08 DIAGNOSIS — E872 Acidosis: Secondary | ICD-10-CM | POA: Diagnosis present

## 2019-02-08 DIAGNOSIS — A419 Sepsis, unspecified organism: Secondary | ICD-10-CM | POA: Diagnosis present

## 2019-02-08 DIAGNOSIS — R45851 Suicidal ideations: Secondary | ICD-10-CM | POA: Diagnosis present

## 2019-02-08 DIAGNOSIS — Y907 Blood alcohol level of 200-239 mg/100 ml: Secondary | ICD-10-CM | POA: Diagnosis present

## 2019-02-08 DIAGNOSIS — T43595A Adverse effect of other antipsychotics and neuroleptics, initial encounter: Secondary | ICD-10-CM | POA: Diagnosis not present

## 2019-02-08 DIAGNOSIS — F1024 Alcohol dependence with alcohol-induced mood disorder: Secondary | ICD-10-CM | POA: Diagnosis present

## 2019-02-08 LAB — BASIC METABOLIC PANEL
Anion gap: 5 (ref 5–15)
BUN: 30 mg/dL — ABNORMAL HIGH (ref 8–23)
CO2: 21 mmol/L — ABNORMAL LOW (ref 22–32)
Calcium: 8.7 mg/dL — ABNORMAL LOW (ref 8.9–10.3)
Chloride: 117 mmol/L — ABNORMAL HIGH (ref 98–111)
Creatinine, Ser: 1.11 mg/dL (ref 0.61–1.24)
GFR calc Af Amer: >60 mL/min (ref >60)
GFR calc non Af Amer: >60 mL/min (ref >60)
Glucose, Bld: 87 mg/dL (ref 70–99)
Potassium: 4.4 mmol/L (ref 3.5–5.1)
Sodium: 143 mmol/L (ref 135–145)

## 2019-02-08 LAB — SARS CORONAVIRUS 2 BY RT PCR (HOSPITAL ORDER, PERFORMED IN ~~LOC~~ HOSPITAL LAB): SARS Coronavirus 2: NEGATIVE

## 2019-02-08 LAB — CBC
HCT: 39.4 % (ref 39.0–52.0)
Hemoglobin: 11.3 g/dL — ABNORMAL LOW (ref 13.0–17.0)
MCH: 19.6 pg — ABNORMAL LOW (ref 26.0–34.0)
MCHC: 28.7 g/dL — ABNORMAL LOW (ref 30.0–36.0)
MCV: 68.4 fL — ABNORMAL LOW (ref 80.0–100.0)
Platelets: 261 10*3/uL (ref 150–400)
RBC: 5.76 MIL/uL (ref 4.22–5.81)
RDW: 23.1 % — ABNORMAL HIGH (ref 11.5–15.5)
WBC: 8.5 10*3/uL (ref 4.0–10.5)
nRBC: 0 % (ref 0.0–0.2)

## 2019-02-08 LAB — MAGNESIUM: Magnesium: 2 mg/dL (ref 1.7–2.4)

## 2019-02-08 LAB — MRSA PCR SCREENING: MRSA by PCR: NEGATIVE

## 2019-02-08 LAB — LACTIC ACID, PLASMA
Lactic Acid, Venous: 2.8 mmol/L (ref 0.5–1.9)
Lactic Acid, Venous: 2.2 mmol/L (ref 0.5–1.9)

## 2019-02-08 LAB — GLUCOSE, CAPILLARY: Glucose-Capillary: 70 mg/dL (ref 70–99)

## 2019-02-08 LAB — VALPROIC ACID LEVEL: Valproic Acid Lvl: 20 ug/mL — ABNORMAL LOW (ref 50.0–100.0)

## 2019-02-08 LAB — TROPONIN I (HIGH SENSITIVITY)
Troponin I (High Sensitivity): 6 ng/L (ref <18)
Troponin I (High Sensitivity): 5 ng/L (ref <18)

## 2019-02-08 LAB — PROCALCITONIN: Procalcitonin: <0.1 ng/mL

## 2019-02-08 LAB — TSH: TSH: 1.421 u[IU]/mL (ref 0.350–4.500)

## 2019-02-08 MED ORDER — DIVALPROEX SODIUM ER 500 MG PO TB24
1000.0000 mg | ORAL_TABLET | Freq: Two times a day (BID) | ORAL | Status: DC
  Administered 2019-02-08: 1000 mg via ORAL
  Filled 2019-02-08 (×2): qty 2

## 2019-02-08 MED ORDER — VITAMIN B-1 100 MG PO TABS: 100.0000 mg | ORAL_TABLET | Freq: Every day | ORAL | Status: DC

## 2019-02-08 MED ORDER — DOCUSATE SODIUM 100 MG PO CAPS
100.0000 mg | ORAL_CAPSULE | Freq: Two times a day (BID) | ORAL | Status: DC
  Filled 2019-02-08: qty 1

## 2019-02-08 MED ORDER — METRONIDAZOLE IN NACL 5-0.79 MG/ML-% IV SOLN
500.0000 mg | Freq: Three times a day (TID) | INTRAVENOUS | Status: DC
  Filled 2019-02-08: qty 100

## 2019-02-08 MED ORDER — VANCOMYCIN HCL 10 G IV SOLR
1750.0000 mg | Freq: Once | INTRAVENOUS | Status: AC
  Administered 2019-02-08: 06:00:00 1750 mg via INTRAVENOUS
  Filled 2019-02-08: qty 1750

## 2019-02-08 MED ORDER — LORAZEPAM 2 MG/ML IJ SOLN: 0.0000 mg | Freq: Two times a day (BID) | INTRAMUSCULAR | Status: DC

## 2019-02-08 MED ORDER — DULOXETINE HCL 20 MG PO CPEP
20.0000 mg | ORAL_CAPSULE | Freq: Every day | ORAL | Status: DC
  Administered 2019-02-08: 20 mg via ORAL
  Filled 2019-02-08: qty 1

## 2019-02-08 MED ORDER — SODIUM CHLORIDE 0.9 % IV SOLN
1.0000 g | Freq: Three times a day (TID) | INTRAVENOUS | Status: DC
  Filled 2019-02-08: qty 1

## 2019-02-08 MED ORDER — THIAMINE HCL 100 MG/ML IJ SOLN: 100.0000 mg | Freq: Every day | INTRAMUSCULAR | Status: DC

## 2019-02-08 MED ORDER — CHLORHEXIDINE GLUCONATE CLOTH 2 % EX PADS
6.0000 | MEDICATED_PAD | Freq: Every day | CUTANEOUS | Status: DC
  Administered 2019-02-08: 6 via TOPICAL
  Filled 2019-02-08: qty 6

## 2019-02-08 MED ORDER — SODIUM CHLORIDE 0.9 % IV SOLN
2.0000 g | Freq: Once | INTRAVENOUS | Status: AC
  Administered 2019-02-08: 2 g via INTRAVENOUS
  Filled 2019-02-08: qty 2

## 2019-02-08 MED ORDER — VITAMIN B-1 100 MG PO TABS
100.0000 mg | ORAL_TABLET | Freq: Every day | ORAL | Status: DC
  Administered 2019-02-08: 100 mg via ORAL
  Filled 2019-02-08: qty 1

## 2019-02-08 MED ORDER — FOLIC ACID 1 MG PO TABS
1.0000 mg | ORAL_TABLET | Freq: Every day | ORAL | Status: DC
  Administered 2019-02-08: 1 mg via ORAL
  Filled 2019-02-08: qty 1

## 2019-02-08 MED ORDER — METRONIDAZOLE IN NACL 5-0.79 MG/ML-% IV SOLN
500.0000 mg | Freq: Once | INTRAVENOUS | Status: AC
  Administered 2019-02-08: 500 mg via INTRAVENOUS
  Filled 2019-02-08: qty 100

## 2019-02-08 MED ORDER — SODIUM CHLORIDE 0.9 % IV SOLN
INTRAVENOUS | Status: DC
  Administered 2019-02-08: 05:00:00 via INTRAVENOUS

## 2019-02-08 MED ORDER — LORAZEPAM 2 MG/ML IJ SOLN: 0.0000 mg | Freq: Four times a day (QID) | INTRAMUSCULAR | Status: DC

## 2019-02-08 MED ORDER — ADULT MULTIVITAMIN W/MINERALS CH
1.0000 | ORAL_TABLET | Freq: Every day | ORAL | Status: DC
  Administered 2019-02-08: 1 via ORAL
  Filled 2019-02-08: qty 1

## 2019-02-08 MED ORDER — VANCOMYCIN HCL IN DEXTROSE 1-5 GM/200ML-% IV SOLN: 1000.0000 mg | Freq: Once | INTRAVENOUS | Status: DC

## 2019-02-08 MED ORDER — ONDANSETRON HCL 4 MG/2ML IJ SOLN: 4.0000 mg | Freq: Four times a day (QID) | INTRAMUSCULAR | Status: DC | PRN

## 2019-02-08 MED ORDER — ONDANSETRON HCL 4 MG PO TABS: 4.0000 mg | ORAL_TABLET | Freq: Four times a day (QID) | ORAL | Status: DC | PRN

## 2019-02-08 MED ORDER — ENOXAPARIN SODIUM 40 MG/0.4ML ~~LOC~~ SOLN
40.0000 mg | Freq: Every day | SUBCUTANEOUS | Status: DC
  Administered 2019-02-08: 40 mg via SUBCUTANEOUS
  Filled 2019-02-08: qty 0.4

## 2019-02-08 MED ORDER — VANCOMYCIN HCL 10 G IV SOLR: 1250.0000 mg | INTRAVENOUS | Status: DC

## 2019-02-08 NOTE — Progress Notes (Signed)
Notified by RN pt stating he wants to leave against medical advise.  I discussed the risk  and possibility of sudden death if pt proceeds with leaving against medical advise.  [name] states he understands the risk.  He is alert, oriented, and denies suicidal or homicidal ideations. He does not show current signs of ETOH withdrawal.  Involuntary Commitment terminated by Psychiatry NP Waylan Boga, release respondent and terminate proceedings paperwork in pts chart.    Marda Stalker, Maria Antonia Pager [phone] (please enter 7 digits) PCCM Consult Pager [phone] (please enter 7 digits)

## 2019-02-08 NOTE — Progress Notes (Signed)
CODE SEPSIS - PHARMACY COMMUNICATION  **Broad Spectrum Antibiotics should be administered within 1 hour of Sepsis diagnosis**  Time Code Sepsis Called/Page Received: 0311  Antibiotics Ordered: Vancomycin and Aztreonam  Time of 1st antibiotic administration: 0342, Aztreonam  Additional action taken by pharmacy: n/a  If necessary, Name of Provider/Nurse Contacted: n/a   Ena Dawley ,PharmD Clinical Pharmacist  02/08/2019  3:14 AM

## 2019-02-08 NOTE — ED Provider Notes (Signed)
-----------------------------------------   2:35 AM on 02/08/2019 -----------------------------------------  Patient's blood pressure in the upper [id number] systolic.  He is sleeping, awakens and interacts appropriately when awake.  Will check blood cultures, lactic acid and troponin.  Likely mild hypotension secondary to oversedation.  Patient has received 3 L IV fluids.  Hesitate to give additional fluids for fear of volume overload.  We will continue to monitor closely.   ----------------------------------------- 3:05 AM on 02/08/2019 -----------------------------------------  Lactate elevated at 2.8; patient already received more than 30cc/kg IV NS. Will administer IV antibiotics. Discuss with hospitalist Dr. Marcille Blanco to evaluate patient in the emergency department for admission.   Paulette Blanch, MD 02/08/19 [phone]

## 2019-02-08 NOTE — H&P (Signed)
Omar Davis is an 68 y.o. male.   Chief Complaint: Alcohol intoxication HPI: The patient with past medical history of alcohol abuse, hypertension and depression was being boarded in the emergency department due to suicidal ideation and alcohol intoxication when vital signs indicated possible sepsis.  Lactic acid was found to be elevated and the patient was subsequently started on broad-spectrum antibiotics.  He reports that he feels fine and denies fevers, chills, nausea, vomiting, shortness of breath or pain anywhere. Initiation of sepsis protocol the emergency department staff called the hospitalist service for admission.  Past Medical History:  Diagnosis Date  . Chronic pain   . Depressed   . Hypertension   . PTSD (post-traumatic stress disorder)   . TBI (traumatic brain injury) [facility])     Past Surgical History:  Procedure Laterality Date  . BACK SURGERY    . HERNIA REPAIR    . KNEE SURGERY    . KNEE SURGERY    . prostectomy N/A     No family history on file.  The patient is not a good historian Social History:  reports that he has never smoked. He has never used smokeless tobacco. He reports current alcohol use. He reports that he does not use drugs.  Allergies:  Allergies  Allergen Reactions  . Codeine Hives and Rash  . Gabapentin Other (See Comments) and Anaphylaxis    seizures Other reaction(s): Other (See Comments) seizures Anaphylactic Shock   . Penicillins Rash    Other reaction(s): Other (See Comments) Other Reaction: Not Assessed Rash   . Lisinopril Swelling    Pt reports lip swelling    (Not in a hospital admission)   Results for orders placed or performed during the hospital encounter of 02/07/19 (from the past 48 hour(s))  Glucose, capillary     Status: None   Collection Time: 02/07/19  5:52 PM  Result Value Ref Range   Glucose-Capillary 95 70 - 99 mg/dL   Comment 1 Notify RN   CBC with Differential     Status: Abnormal   Collection Time:  02/07/19  5:57 PM  Result Value Ref Range   WBC 13.0 (H) 4.0 - 10.5 K/uL   RBC 5.96 (H) 4.22 - 5.81 MIL/uL   Hemoglobin 11.8 (L) 13.0 - 17.0 g/dL   HCT 39.6 39.0 - 52.0 %   MCV 66.4 (L) 80.0 - 100.0 fL   MCH 19.8 (L) 26.0 - 34.0 pg   MCHC 29.8 (L) 30.0 - 36.0 g/dL   RDW 22.8 (H) 11.5 - 15.5 %   Platelets 290 150 - 400 K/uL   nRBC 0.0 0.0 - 0.2 %   Neutrophils Relative % 82 %   Neutro Abs 10.7 (H) 1.7 - 7.7 K/uL   Lymphocytes Relative 9 %   Lymphs Abs 1.2 0.7 - 4.0 K/uL   Monocytes Relative 6 %   Monocytes Absolute 0.8 0.1 - 1.0 K/uL   Eosinophils Relative 1 %   Eosinophils Absolute 0.2 0.0 - 0.5 K/uL   Basophils Relative 1 %   Basophils Absolute 0.1 0.0 - 0.1 K/uL   WBC Morphology MORPHOLOGY UNREMARKABLE    RBC Morphology MIXED RBC POPULATION    Smear Review Normal platelet morphology    Immature Granulocytes 1 %   Abs Immature Granulocytes 0.11 (H) 0.00 - 0.07 K/uL    Comment: Performed at [facility], [address], [place], [place] [address]  Comprehensive metabolic panel     Status: Abnormal   Collection Time:  02/07/19  5:57 PM  Result Value Ref Range   Sodium 134 (L) 135 - 145 mmol/L   Potassium 3.9 3.5 - 5.1 mmol/L   Chloride 104 98 - 111 mmol/L   CO2 17 (L) 22 - 32 mmol/L   Glucose, Bld 98 70 - 99 mg/dL   BUN 40 (H) 8 - 23 mg/dL   Creatinine, Ser 1.27 (H) 0.61 - 1.24 mg/dL   Calcium 9.3 8.9 - 10.3 mg/dL   Total Protein 6.5 6.5 - 8.1 g/dL   Albumin 3.5 3.5 - 5.0 g/dL   AST 19 15 - 41 U/L   ALT 19 0 - 44 U/L   Alkaline Phosphatase 99 38 - 126 U/L   Total Bilirubin 0.5 0.3 - 1.2 mg/dL   GFR calc non Af Amer 57 (L) >60 mL/min   GFR calc Af Amer >60 >60 mL/min   Anion gap 13 5 - 15    Comment: Performed at [facility], [address], [place], [place] [id number]  Salicylate level     Status: None   Collection Time: 02/07/19  5:57 PM  Result Value Ref Range   Salicylate Lvl [id number] 2.8 - 30.0 mg/dL    Comment: Performed at [facility], [address] [place], [place] [address]  Acetaminophen level     Status: Abnormal   Collection Time: 02/07/19  5:57 PM  Result Value Ref Range   Acetaminophen (Tylenol), Serum <10 (L) 10 - 30 ug/mL    Comment: (NOTE) Therapeutic concentrations vary significantly. A range of 10-30 ug/mL  may be an effective concentration for many patients. However, some  are best treated at concentrations outside of this range. Acetaminophen concentrations >150 ug/mL at 4 hours after ingestion  and >50 ug/mL at 12 hours after ingestion are often associated with  toxic reactions. Performed at [facility], [address] [place], [place] [address]   Ethanol     Status: Abnormal   Collection Time: 02/07/19  5:57 PM  Result Value Ref Range   Alcohol, Ethyl (B) 217 (H) <10 mg/dL    Comment: (NOTE) Lowest detectable limit for serum alcohol is 10 mg/dL. For medical purposes only. Performed at [facility], [address] [place], [place] [address]   Valproic acid level     Status: None   Collection Time: 02/07/19  5:57 PM  Result Value Ref Range   Valproic Acid Lvl 59 50.0 - 100.0 ug/mL    Comment: Performed at [facility], [address] [place], [place] [address]  Urine Drug Screen, Qualitative ([facility] only)     Status: None   Collection Time: 02/07/19  5:58 PM  Result Value Ref Range   Tricyclic, Ur Screen NONE DETECTED NONE DETECTED   Amphetamines, Ur Screen NONE DETECTED NONE DETECTED   MDMA (Ecstasy)Ur Screen NONE DETECTED NONE DETECTED   Cocaine Metabolite,Ur [place] NONE DETECTED NONE DETECTED   Opiate, Ur Screen NONE DETECTED NONE DETECTED   Phencyclidine (PCP) Ur S NONE DETECTED NONE DETECTED   Cannabinoid 50 Ng, Ur [place] NONE DETECTED NONE DETECTED   Barbiturates, Ur Screen NONE DETECTED NONE DETECTED   Benzodiazepine, Ur Scrn NONE DETECTED NONE DETECTED   Methadone Scn, Ur NONE DETECTED NONE DETECTED    Comment: (NOTE) Tricyclics + metabolites, urine     Cutoff 1000 ng/mL Amphetamines + metabolites, urine  Cutoff 1000 ng/mL MDMA (Ecstasy), urine              Cutoff 500 ng/mL  Cocaine Metabolite, urine          Cutoff 300 ng/mL Opiate + metabolites, urine        Cutoff 300 ng/mL Phencyclidine (PCP), urine         Cutoff 25 ng/mL Cannabinoid, urine                 Cutoff 50 ng/mL Barbiturates + metabolites, urine  Cutoff 200 ng/mL Benzodiazepine, urine              Cutoff 200 ng/mL Methadone, urine                   Cutoff 300 ng/mL The urine drug screen provides only a preliminary, unconfirmed analytical test result and should not be used for non-medical purposes. Clinical consideration and professional judgment should be applied to any positive drug screen result due to possible interfering substances. A more specific alternate chemical method must be used in order to obtain a confirmed analytical result. Gas chromatography / mass spectrometry (GC/MS) is the preferred confirmat ory method. Performed at [facility], [address] [place], [place] [address]   Urinalysis, Routine w reflex microscopic     Status: Abnormal   Collection Time: 02/07/19  5:58 PM  Result Value Ref Range   Color, Urine COLORLESS (A) YELLOW   APPearance CLEAR (A) CLEAR   Specific Gravity, Urine 1.002 (L) 1.005 - 1.030   pH 6.0 5.0 - 8.0   Glucose, UA NEGATIVE NEGATIVE mg/dL   Hgb urine dipstick NEGATIVE NEGATIVE   Bilirubin Urine NEGATIVE NEGATIVE   Ketones, ur NEGATIVE NEGATIVE mg/dL   Protein, ur NEGATIVE NEGATIVE mg/dL   Nitrite NEGATIVE NEGATIVE   Leukocytes,Ua NEGATIVE NEGATIVE    Comment: Performed at [facility], [address], [place], [place] [address]  SARS Coronavirus 2 [facility] order, Performed in [facility] hospital lab) Nasopharyngeal Nasopharyngeal Swab     Status: None   Collection Time: 02/08/19  2:31 AM   Specimen: Nasopharyngeal Swab  Result Value Ref Range   SARS Coronavirus 2 NEGATIVE NEGATIVE    Comment:  (NOTE) If result is NEGATIVE SARS-CoV-2 target nucleic acids are NOT DETECTED. The SARS-CoV-2 RNA is generally detectable in upper and lower  respiratory specimens during the acute phase of infection. The lowest  concentration of SARS-CoV-2 viral copies this assay can detect is 250  copies / mL. A negative result does not preclude SARS-CoV-2 infection  and should not be used as the sole basis for treatment or other  patient management decisions.  A negative result may occur with  improper specimen collection / handling, submission of specimen other  than nasopharyngeal swab, presence of viral mutation(s) within the  areas targeted by this assay, and inadequate number of viral copies  (<250 copies / mL). A negative result must be combined with clinical  observations, patient history, and epidemiological information. If result is POSITIVE SARS-CoV-2 target nucleic acids are DETECTED. The SARS-CoV-2 RNA is generally detectable in upper and lower  respiratory specimens dur ing the acute phase of infection.  Positive  results are indicative of active infection with SARS-CoV-2.  Clinical  correlation with patient history and other diagnostic information is  necessary to determine patient infection status.  Positive results do  not rule out bacterial infection or co-infection with other viruses. If result is PRESUMPTIVE POSTIVE SARS-CoV-2 nucleic acids MAY BE PRESENT.   A presumptive positive result was obtained on the submitted specimen  and confirmed on repeat testing.  While  2019 novel coronavirus  (SARS-CoV-2) nucleic acids may be present in the submitted sample  additional confirmatory testing may be necessary for epidemiological  and / or clinical management purposes  to differentiate between  SARS-CoV-2 and other Sarbecovirus currently known to infect humans.  If clinically indicated additional testing with an alternate test  methodology [phone]) is advised. The SARS-CoV-2 RNA is  generally  detectable in upper and lower respiratory sp ecimens during the acute  phase of infection. The expected result is Negative. Fact Sheet for Patients:  StrictlyIdeas.no Fact Sheet for Healthcare Providers: BankingDealers.co.za This test is not yet approved or cleared by the [place] FDA and has been authorized for detection and/or diagnosis of SARS-CoV-2 by FDA under an Emergency Use Authorization (EUA).  This EUA will remain in effect (meaning this test can be used) for the duration of the COVID-19 declaration under Section 564(b)(1) of the Act, 21 U.S.C. section 360bbb-3(b)(1), unless the authorization is terminated or revoked sooner. Performed at [facility], [address] [place], [place] [address]   Lactic acid, plasma     Status: Abnormal   Collection Time: 02/08/19  2:31 AM  Result Value Ref Range   Lactic Acid, Venous 2.8 (HH) 0.5 - 1.9 mmol/L    Comment: CRITICAL RESULT CALLED TO, READ BACK BY AND VERIFIED WITH SHERRIE ALLISON ON 02/08/19 AT [facility] Performed at [facility], [address] [place],  [address]    Dg Chest Portable 1 View  Result Date: 02/07/2019 CLINICAL DATA:  Shortness of breath EXAM: PORTABLE CHEST 1 VIEW COMPARISON:  Radiograph January 16, 2019 FINDINGS: No consolidation, features of edema, pneumothorax, or effusion. Pulmonary vascularity is normally distributed. The cardiomediastinal contours are unremarkable. No acute osseous or soft tissue abnormality. Degenerative changes are present in the imaged spine and shoulders. 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 IMPRESSION: No acute cardiopulmonary process. Electronically Signed   By: Lovena Le M.D.   On: 02/07/2019 19:58    Review of Systems  Constitutional: Negative for chills and fever.  HENT: Negative for sore throat and tinnitus.   Eyes: Negative for  blurred vision and redness.  Respiratory: Negative for cough and shortness of breath.   Cardiovascular: Negative for chest pain, palpitations, orthopnea and PND.  Gastrointestinal: Negative for abdominal pain, diarrhea, nausea and vomiting.  Genitourinary: Negative for dysuria, frequency and urgency.  Musculoskeletal: Negative for joint pain and myalgias.  Skin: Negative for rash.       No lesions  Neurological: Negative for speech change, focal weakness and weakness.  Endo/Heme/Allergies: Does not bruise/bleed easily.       No temperature intolerance  Psychiatric/Behavioral: Negative for depression and suicidal ideas.    Blood pressure 124/75, pulse 78, temperature 98.2 F (36.8 C), temperature source Oral, resp. rate (!) 27, height 5\' 6"  (1.676 m), weight 77 kg, SpO2 99 %. Physical Exam  Vitals reviewed. Constitutional: He is oriented to person, place, and time. He appears well-developed and well-nourished. No distress.  HENT:  Head: Normocephalic and atraumatic.  Mouth/Throat: Oropharynx is clear and moist.  Eyes: Pupils are equal, round, and reactive to light. Conjunctivae and EOM are normal. No scleral icterus.  Neck: Normal range of motion. Neck supple. No JVD present. No tracheal deviation present. No thyromegaly present.  Cardiovascular: Normal rate, regular rhythm and normal heart sounds.  Respiratory: Effort normal and breath sounds normal. Tachypnea noted. No respiratory distress.  GI: Soft. Bowel sounds are normal.  He exhibits no distension. There is no abdominal tenderness.  Genitourinary:    Genitourinary Comments: Deferred   Musculoskeletal: Normal range of motion.        General: No edema.  Lymphadenopathy:    He has no cervical adenopathy.  Neurological: He is alert and oriented to person, place, and time. No cranial nerve deficit.  Skin: Skin is warm and dry. No rash noted. No erythema.  Psychiatric: He has a normal mood and affect. His behavior is normal.  Judgment and thought content normal.     Assessment/Plan This is a 68 year old male admitted for sepsis. 1.  Sepsis: The patient meets criteria via tachypnea and elevated lactic acid.  The latter may be secondary to Ativan metabolism.  Nonetheless, follow blood cultures for growth and sensitivities.  No source of infection has been determined.  Continue aztreonam, vancomycin and Flagyl.  The patient is hemodynamically stable.  Hydrate with intravenous fluid. 2.  Alcohol abuse: Acute withdrawal seems to be over; continue CIWA protocol.  Depakote for seizure prophylaxis per home regimen 3.  Hypertension: Controlled; resume antihypertensive medication when blood pressure is consistently normal 4.  Depression: The patient is still involuntarily committed due to suicidal ideation.  The patient does not have a plan or active suicidal thoughts at this time. 5.  DVT prophylaxis: Lovenox 6.  GI prophylaxis: None the patient is a full code.  Time spent on admission orders and patient care approximately 45 minutes  Harrie Foreman, MD 02/08/2019, 3:50 AM

## 2019-02-08 NOTE — Consult Note (Signed)
Name: Omar Davis MRN: [id number] DOB: 1951/02/17    ADMISSION DATE:  02/07/2019 CONSULTATION DATE:  02/08/2019  REFERRING MD : Dr. Marcille Blanco  CHIEF COMPLAINT: Alcohol intoxication and Depression  BRIEF PATIENT DESCRIPTION:  68 year old male with a past medical history notable for PTSD, depression, alcohol abuse presented 9/17 with alcohol intoxication and depression.  He required Ativan and Geodon in the ED for severe agitation, and eventually developed hypotension likely in the setting of oversedation.  His qSOFA score was 2, however no obvious source of infection at this time.  Concern for eventual impending DTs.  Involuntarily committed, Psych consult pending.  SIGNIFICANT EVENTS  9/17>> presented to ED for alcohol intoxication, required Geodon and Ativan for agitation 9/18>> developed hypotension, admission to ICU 9/18>> Hypotension resolved upon presentation to ICU  STUDIES:  N/A  CULTURES: SARS-CoV-2 PCR 9/18>> negative Blood x2 9/18>> Urine 9/18>>  ANTIBIOTICS: Aztreonam 9/18>> Flagyl 9/18>> Vancomycin 9/18>>  HISTORY OF PRESENT ILLNESS:   Omar Davis is a 68 year old male with a past medical history notable for PTSD, alcohol abuse, depression, TBI, hypertension who presents to [facility] ED on 02/07/2019 after being brought in by the [employer] from a hotel as he was found yelling  "damn [facility], just kill me", and verbalizing that he wanted the police to shoot him.  He was intoxicated, swinging at police officers, of which she was placed in restraints and involuntarily committed.  He was just discharged from the psych unit yesterday morning, then he went to the hotel and started drinking vodka.  Given his severe agitation upon presentation to the ED, he was given Geodon and Ativan.  Initial work-up in the ED revealed ethanol level 217, serum acetaminophen < 10, salicylates <7, valproic acid 59, WBC 13, hemoglobin 11.8, sodium 134, creatinine 1.27. Urine drug  screen is negative.  Chest x-ray was negative.  Upon physical examination in the ED he exhibited no rigidity, clonus, hyperthermia, focal neurologic deficit, diaphoresis, tachycardia, meningismus, ataxia, or gait abnormality.  He was medically cleared and to be dispositioned per Psychiatry.  Psychiatry attempted to provide an initial psychiatric assessment, but the pt remained intoxicated and unable to participate in the assessment process.  Plan was for assessment at a later time.  Early in the morning on 9/18 he became hypotensive with systolic blood pressures in the 80s.  Lactic acid found to be 2.8, high-sensitivity troponin 6.0.  Blood cultures were obtained. His COVID-19 PCR is negative.  He was given 3 L of IV fluids and placed on broad-spectrum antibiotics.  He is being admitted to the stepdown unit by the hospitalist for further work-up and treatment of hypotension.  PCCM is consulted for further management. Suspect his hypotension is secondary to oversedation.  There is also current concern for eventual impending DTs.  Upon presentation to Stepdown, pt is Awake and alert, calm and cooperative, exhibits no focal neuro deficits, afebrile, and Normotensive. He reports that he was not suicidal yesterday and had no intentions of hurting himself, but that "when I drink I will say anything."  PAST MEDICAL HISTORY :   has a past medical history of Chronic pain, Depressed, Hypertension, PTSD (post-traumatic stress disorder), and TBI (traumatic brain injury) ([place]).  has a past surgical history that includes Knee surgery; Hernia repair; prostectomy (N/A); Knee surgery; and Back surgery. Prior to Admission medications   Medication Sig Start Date End Date Taking? Authorizing Provider  divalproex (DEPAKOTE ER) 500 MG 24 hr tablet Take 2 tablets (1,000 mg total) by  mouth 2 (two) times daily. 02/07/19  Yes Clapacs, Madie Reno, MD  DULoxetine (CYMBALTA) 30 MG capsule Take 1 capsule (30 mg total) by mouth daily.  02/07/19  Yes Clapacs, Madie Reno, MD  lisinopril (ZESTRIL) 10 MG tablet Take 1 tablet (10 mg total) by mouth daily. 02/07/19  Yes Clapacs, Madie Reno, MD  naproxen (NAPROSYN) 500 MG tablet Take 1 tablet (500 mg total) by mouth 2 (two) times daily as needed for moderate pain. 02/07/19  Yes Clapacs, Madie Reno, MD  pravastatin (PRAVACHOL) 80 MG tablet Take 1 tablet (80 mg total) by mouth at bedtime. 02/07/19  Yes Clapacs, Madie Reno, MD  thiamine 100 MG tablet Take 1 tablet (100 mg total) by mouth daily. 02/07/19  Yes Clapacs, Madie Reno, MD   Allergies  Allergen Reactions   Codeine Hives and Rash   Gabapentin Other (See Comments) and Anaphylaxis    seizures Other reaction(s): Other (See Comments) seizures Anaphylactic Shock    Penicillins Rash    Other reaction(s): Other (See Comments) Other Reaction: Not Assessed Rash    Lisinopril Swelling    Pt reports lip swelling    FAMILY HISTORY:  family history is not on file. SOCIAL HISTORY:  reports that he has never smoked. He has never used smokeless tobacco. He reports current alcohol use. He reports that he does not use drugs.   COVID-19 DISASTER DECLARATION:  FULL CONTACT PHYSICAL EXAMINATION WAS NOT POSSIBLE DUE TO TREATMENT OF COVID-19 AND  CONSERVATION OF PERSONAL PROTECTIVE EQUIPMENT, LIMITED EXAM FINDINGS INCLUDE-  Patient assessed or the symptoms described in the history of present illness.  In the context of the Global COVID-19 pandemic, which necessitated consideration that the patient might be at risk for infection with the SARS-CoV-2 virus that causes COVID-19, Institutional protocols and algorithms that pertain to the evaluation of patients at risk for COVID-19 are in a state of rapid change based on information released by regulatory bodies including the CDC and federal and state organizations. These policies and algorithms were followed during the patient's care while in hospital.  REVIEW OF SYSTEMS:  Positives in BOLD: Pt denies all  complaints Constitutional: Negative for fever, chills, weight loss, malaise/fatigue and diaphoresis.  HENT: Negative for hearing loss, ear pain, nosebleeds, congestion, sore throat, neck pain, tinnitus and ear discharge.   Eyes: Negative for blurred vision, double vision, photophobia, pain, discharge and redness.  Respiratory: Negative for cough, hemoptysis, sputum production, shortness of breath, wheezing and stridor.   Cardiovascular: Negative for chest pain, palpitations, orthopnea, claudication, leg swelling and PND.  Gastrointestinal: Negative for heartburn, nausea, vomiting, abdominal pain, diarrhea, constipation, blood in stool and melena.  Genitourinary: Negative for dysuria, urgency, frequency, hematuria and flank pain.  Musculoskeletal: Negative for myalgias, back pain, joint pain and falls.  Skin: Negative for itching and rash.  Neurological: Negative for dizziness, tingling, tremors, sensory change, speech change, focal weakness, seizures, loss of consciousness, weakness and headaches.  Endo/Heme/Allergies: Negative for environmental allergies and polydipsia. Does not bruise/bleed easily.  SUBJECTIVE:  Pt denies shortness of breath, chest pain, palpitations, dizziness, abdominal pain, nausea, vomiting, fever/chills On room air Denies suicidal ideation or intent to harm others Wanting to know when he will be able to be discharged  VITAL SIGNS: Temp:  [98.2 F (36.8 C)] 98.2 F (36.8 C) (09/17 1800) Pulse Rate:  [74-97] 78 (09/18 0330) Resp:  [12-28] 27 (09/18 0330) BP: (60-132)/(33-82) 124/75 (09/18 0330) SpO2:  [92 %-100 %] 99 % (09/18 0330) Weight:  [77 kg] 77 kg (  09/17 1639)  PHYSICAL EXAMINATION: General:  Sitting in bed, on room air, in no acute distess Neuro:  Awake and alert, oriented x3, follows commands, no focal deficits, speech clear HEENT:  Atraumatic, normocephalic, neck supple, no JVD, pupils PERRLA Cardiovascular:  Regular rate and rhythm, s1s2, no M/R/G, 2+  pulses throughout Lungs:  Clear to auscultation bilaterally, even, nonlabored, normal effort Abdomen:  Soft, nontender, nondistended, no guarding or rebound tenderness, BS+ x4 Musculoskeletal:  Normal bulk and tone, no deformities, no edema Skin:  Warm and dry, no obvious rashes, lesions, or ulcerations  Recent Labs  Lab 02/03/19 1450 02/05/19 1825 02/07/19 1757  NA 140 140 134*  K 3.5 4.4 3.9  CL 108 108 104  CO2 19* 25 17*  BUN 22 29* 40*  CREATININE 1.14 1.10 1.27*  GLUCOSE 194* 109* 98   Recent Labs  Lab 02/03/19 1450 02/05/19 1825 02/07/19 1757  HGB 12.4* 12.1* 11.8*  HCT 42.9 41.4 39.6  WBC 9.9 9.3 13.0*  PLT 333 307 290   Dg Chest Portable 1 View  Result Date: 02/07/2019 CLINICAL DATA:  Shortness of breath EXAM: PORTABLE CHEST 1 VIEW COMPARISON:  Radiograph January 16, 2019 FINDINGS: No consolidation, features of edema, pneumothorax, or effusion. Pulmonary vascularity is normally distributed. The cardiomediastinal contours are unremarkable. No acute osseous or soft tissue abnormality. Degenerative changes are present in the imaged spine and shoulders. 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 IMPRESSION: No acute cardiopulmonary process. Electronically Signed   By: Lovena Le M.D.   On: 02/07/2019 19:58    ASSESSMENT / PLAN:  Transient Hypotension, likely in the setting of oversedation>> now resolved Lactic acidosis -Continuous cardiac monitoring -Maintain map greater than 65 -Received 3 L IV fluid -Neo-Synephrine if needed to maintain map goal -High-sensitivity troponin ~ 6 -Trend lactic acid  qSOFA score 2, no obvious source of infection -Monitor fever curve -Trend WBCs and procalcitonin -Follow cultures as above  -CXR negative -UA not concerning for UTI -Continue antibiotics as above ~if procalcitonin is negative can discontinue antibiotics  AKI Mild hyponatremia -Monitor I&O's / urinary  output -Follow BMP -Ensure adequate renal perfusion -Avoid nephrotoxic agents as able -Replace electrolytes as indicated  -IV fluids  Anemia without signs and symptoms of bleeding -Monitor for S/Sx of bleeding -Trend CBC -Lovenox subcu for VTE Prophylaxis  -Transfuse for Hgb <7  Acute encephalopathy in setting of alcohol intoxication>>resolved Concern for possible impending DTs Hx: PTSD, TBI, alcohol abuse, depression -Provide supportive care -Psychiatry consulted, appreciate input -Involuntary committed  -CIWA protocol -Continue thiamine, folic acid, and Multivitamin -Encourage substance abuse cessation      Disposition: Stepdown Goals of care: Full code VTE prophylaxis: Lovenox subcu Updates: Updated patient at bedside 02/08/2019  Darel Hong, [facility] [place] [facility] Pager: [phone] Cell: [phone]  02/08/2019, 4:21 AM

## 2019-02-08 NOTE — Progress Notes (Signed)
All patient belongings including cell phone, charger, and wallet are bagged and locked up in the ED behavioral unit.   Cameron Ali, RN

## 2019-02-08 NOTE — Progress Notes (Signed)
Admitted this morning for alcohol intoxication, possible sepsis.  Patient procalcitonin level less than 1, antibiotics stopped, patient is alert and awake during my visit this morning, 1.  Alcohol intoxication, patient admitted to ICU for close monitoring, hopefully can be transferred out of ICU and follow CIWA protocol and monitor for withdrawal symptoms 2.  Alcohol dependence with intoxication, fluids given, use Ativan for alcohol detox program, seen by psychiatry 3.  Depression, continue Cymbalta 20 mg p.o. daily 4.  Seizure disorder, continue Depakote.

## 2019-02-08 NOTE — BH Assessment (Signed)
Completed IVC rescission.  Tubed back to ICU.

## 2019-02-08 NOTE — Clinical Social Work Note (Signed)
CSW acknowledges consults for substance abuse and intentional overdose. Following for psych recommendations.  Dayton Scrape, [place]

## 2019-02-08 NOTE — Progress Notes (Signed)
Patient left AMA. See Omar Dyer NP's note. Patient pleasant but wanted to go back to his motel room. All belongings retrieved from [facility] in ED and sent with patient. Patient called a cab.

## 2019-02-08 NOTE — Progress Notes (Signed)
PHARMACY -  BRIEF ANTIBIOTIC NOTE   Pharmacy has received consult(s) for Vancomycin and Aztreonam from an ED provider.  The patient's profile has been reviewed for ht/wt/allergies/indication/available labs.    One time order(s) placed for Vancomycin 1750mg  and Aztreonam 2gm  Further antibiotics/pharmacy consults should be ordered by admitting physician if indicated.                       Thank you, Hart Robinsons A 02/08/2019  3:15 AM
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
## 2019-02-08 NOTE — Progress Notes (Signed)
Pharmacy Antibiotic Note  Omar Davis is a 68 y.o. male admitted on 02/07/2019 with sepsis.  Pharmacy has been consulted for Aztreonam and Vancomycin dosing.  Pt received Aztreonam 2gm and Vancomycin 1750mg  loading dose was ordered.  Plan: Aztreonam 1gm IV q8hrs  Vancomycin 1250 mg IV Q 24 hrs. Goal AUC 400-550. Expected AUC: 501.7 SCr used: 1.27  Height: 5\' 6"  (167.6 cm) Weight: 169 lb 12.1 oz (77 kg) IBW/kg (Calculated) : 63.8  Temp (24hrs), Avg:98.1 F (36.7 C), Min:97.9 F (36.6 C), Max:98.2 F (36.8 C)  Recent Labs  Lab 02/01/19 2223 02/02/19 1400 02/03/19 1450 02/05/19 1825 02/07/19 1757 02/08/19 0231  WBC 9.0 10.6* 9.9 9.3 13.0*  --   CREATININE 1.14 1.24 1.14 1.10 1.27*  --   LATICACIDVEN  --   --   --   --   --  2.8*    Estimated Creatinine Clearance: 52.9 mL/min (A) (by C-G formula based on SCr of 1.27 mg/dL (H)).    Allergies  Allergen Reactions  . Codeine Hives and Rash  . Gabapentin Other (See Comments) and Anaphylaxis    seizures Other reaction(s): Other (See Comments) seizures Anaphylactic Shock   . Penicillins Rash    Other reaction(s): Other (See Comments) Other Reaction: Not Assessed Rash   . Lisinopril Swelling    Pt reports lip swelling   Antimicrobials this admission: Aztreonam 9/18 >>  Vancomycin 9/18 >>  Flagyl 9/18 >>  Dose adjustments this admission:   Microbiology results:  BCx:   UCx:    Sputum:    MRSA PCR:   Thank you for allowing pharmacy to be a part of this patient's care.  Hart Robinsons A 02/08/2019 4:41 AM

## 2019-02-08 NOTE — Progress Notes (Signed)
Pts vss no signs of ETOH withdrawal at this time, alert and oriented, nsr on cardiac monitor, hypotension resolved, septic workup negative. Pt stable for transfer to the medsurg unit remains involuntarily committed and sitter at bedside awaiting psychiatric evaluation.  Will continue to monitor and assess pt.  Marda Stalker, [place] Pager [phone] (please enter 7 digits) PCCM Consult Pager [phone] (please enter 7 digits)

## 2019-02-08 NOTE — ED Notes (Signed)
Went thru pts belonging bags and retrieved his teeth.  Placed them in his room.  Pt aware.

## 2019-02-08 NOTE — ED Notes (Signed)
Pt given his teeth as requested. Pt told he is to be admitted. Pt is calm and cooperative at this time.

## 2019-02-08 NOTE — ED Notes (Signed)
Pt has rolled back on his left side and BP reads low. Pt readjusted to obtain a more accurate reading. BP improved.

## 2019-02-08 NOTE — ED Notes (Signed)
ED TO INPATIENT HANDOFF REPORT  ED Nurse Name and Phone #:  [place]  S Name/Age/Gender Omar Davis 68 y.o. male Room/Bed: ED23A/ED23AA  Code Status   Code Status: Prior  Home/SNF/Other Home Patient oriented to: self, place, time and situation Is this baseline? Yes   Triage Complete: Triage complete  Chief Complaint Alcohol intoxication  Triage Note Pt to ER with [employer] from a hotel. Pt yelling, "[facility], just kill [facility]". Pt clearly intoxicated. Swinging at [employer] officer, bilateral forensic restrains on patient. Pt unable to be vitalized due to behavior. Taken to room 23 via wheelchair by [employer] and Jonni Sanger, ED Tech. Primary RN aware.   Allergies Allergies  Allergen Reactions  . Codeine Hives and Rash  . Gabapentin Other (See Comments) and Anaphylaxis    seizures Other reaction(s): Other (See Comments) seizures Anaphylactic Shock   . Penicillins Rash    Other reaction(s): Other (See Comments) Other Reaction: Not Assessed Rash   . Lisinopril Swelling    Pt reports lip swelling    Level of Care/Admitting Diagnosis ED Disposition    ED Disposition Condition [facility]: [place] [[id number]]  Level of Care: Stepdown [14]  Covid Evaluation: Asymptomatic Screening Protocol (No Symptoms)  Diagnosis: Sepsis [id number]  Admitting Physician: Harrie Foreman [id number]  Attending Physician: Harrie Foreman [id number]  Estimated length of stay: past midnight tomorrow  Certification:: I certify this patient will need inpatient services for at least 2 midnights  PT Class (Do Not Modify): Inpatient [101]  PT Acc Code (Do Not Modify): Private [1]       B Medical/Surgery History Past Medical History:  Diagnosis Date  . Chronic pain   . Depressed   . Hypertension   . PTSD (post-traumatic stress disorder)   . TBI (traumatic brain injury) [facility])    Past Surgical History:  Procedure Laterality Date  . BACK SURGERY     . HERNIA REPAIR    . KNEE SURGERY    . KNEE SURGERY    . prostectomy N/A      A IV Location/Drains/Wounds Patient Lines/Drains/Airways Status   Active Line/Drains/Airways    Name:   Placement date:   Placement time:   Site:   Days:   Peripheral IV 02/07/19 Right Forearm   02/07/19    1823    Forearm   1   Peripheral IV 02/08/19 Left Forearm   02/08/19    0343    Forearm   less than 1          Intake/Output Last 24 hours  Intake/Output Summary (Last 24 hours) at 02/08/2019 0345 Last data filed at 02/08/2019 0137 Gross per 24 hour  Intake 1000 ml  Output 1400 ml  Net -400 ml    Labs/Imaging Results for orders placed or performed during the hospital encounter of 02/07/19 (from the past 48 hour(s))  Glucose, capillary     Status: None   Collection Time: 02/07/19  5:52 PM  Result Value Ref Range   Glucose-Capillary 95 70 - 99 mg/dL   Comment 1 Notify RN   CBC with Differential     Status: Abnormal   Collection Time: 02/07/19  5:57 PM  Result Value Ref Range   WBC 13.0 (H) 4.0 - 10.5 K/uL   RBC 5.96 (H) 4.22 - 5.81 MIL/uL   Hemoglobin 11.8 (L) 13.0 - 17.0 g/dL   HCT 39.6 39.0 - 52.0 %   MCV 66.4 (L) 80.0 -  100.0 fL   MCH 19.8 (L) 26.0 - 34.0 pg   MCHC 29.8 (L) 30.0 - 36.0 g/dL   RDW 22.8 (H) 11.5 - 15.5 %   Platelets 290 150 - 400 K/uL   nRBC 0.0 0.0 - 0.2 %   Neutrophils Relative % 82 %   Neutro Abs 10.7 (H) 1.7 - 7.7 K/uL   Lymphocytes Relative 9 %   Lymphs Abs 1.2 0.7 - 4.0 K/uL   Monocytes Relative 6 %   Monocytes Absolute 0.8 0.1 - 1.0 K/uL   Eosinophils Relative 1 %   Eosinophils Absolute 0.2 0.0 - 0.5 K/uL   Basophils Relative 1 %   Basophils Absolute 0.1 0.0 - 0.1 K/uL   WBC Morphology MORPHOLOGY UNREMARKABLE    RBC Morphology MIXED RBC POPULATION    Smear Review Normal platelet morphology    Immature Granulocytes 1 %   Abs Immature Granulocytes 0.11 (H) 0.00 - 0.07 K/uL    Comment: Performed at [facility], [address] [place],  [place] [address]  Comprehensive metabolic panel     Status: Abnormal   Collection Time: 02/07/19  5:57 PM  Result Value Ref Range   Sodium 134 (L) 135 - 145 mmol/L   Potassium 3.9 3.5 - 5.1 mmol/L   Chloride 104 98 - 111 mmol/L   CO2 17 (L) 22 - 32 mmol/L   Glucose, Bld 98 70 - 99 mg/dL   BUN 40 (H) 8 - 23 mg/dL   Creatinine, Ser 1.27 (H) 0.61 - 1.24 mg/dL   Calcium 9.3 8.9 - 10.3 mg/dL   Total Protein 6.5 6.5 - 8.1 g/dL   Albumin 3.5 3.5 - 5.0 g/dL   AST 19 15 - 41 U/L   ALT 19 0 - 44 U/L   Alkaline Phosphatase 99 38 - 126 U/L   Total Bilirubin 0.5 0.3 - 1.2 mg/dL   GFR calc non Af Amer 57 (L) >60 mL/min   GFR calc Af Amer >60 >60 mL/min   Anion gap 13 5 - 15    Comment: Performed at [facility], [address] [place], [place] [id number]  Salicylate level     Status: None   Collection Time: 02/07/19  5:57 PM  Result Value Ref Range   Salicylate Lvl [id number] 2.8 - 30.0 mg/dL    Comment: Performed at [facility], [address] [place], [place] [address]  Acetaminophen level     Status: Abnormal   Collection Time: 02/07/19  5:57 PM  Result Value Ref Range   Acetaminophen (Tylenol), Serum <10 (L) 10 - 30 ug/mL    Comment: (NOTE) Therapeutic concentrations vary significantly. A range of 10-30 ug/mL  may be an effective concentration for many patients. However, some  are best treated at concentrations outside of this range. Acetaminophen concentrations >150 ug/mL at 4 hours after ingestion  and >50 ug/mL at 12 hours after ingestion are often associated with  toxic reactions. Performed at [facility], [address] [place], [place] [address]   Ethanol     Status: Abnormal   Collection Time: 02/07/19  5:57 PM  Result Value Ref Range   Alcohol, Ethyl (B) 217 (H) <10 mg/dL    Comment: (NOTE) Lowest detectable limit for serum alcohol is 10 mg/dL. For medical purposes only. Performed at [facility], [address] [place], [place]  [address]   Valproic acid level     Status: None   Collection Time: 02/07/19  5:57 PM  Result Value Ref Range   Valproic Acid Lvl 59 50.0 - 100.0 ug/mL    Comment: Performed at [facility], [address] [place], [place] [address]  Urine Drug Screen, Qualitative [facility] only)     Status: None   Collection Time: 02/07/19  5:58 PM  Result Value Ref Range   Tricyclic, Ur Screen NONE DETECTED NONE DETECTED   Amphetamines, Ur Screen NONE DETECTED NONE DETECTED   MDMA (Ecstasy)Ur Screen NONE DETECTED NONE DETECTED   Cocaine Metabolite,Ur [place] NONE DETECTED NONE DETECTED   Opiate, Ur Screen NONE DETECTED NONE DETECTED   Phencyclidine (PCP) Ur S NONE DETECTED NONE DETECTED   Cannabinoid 50 Ng, Ur [place] NONE DETECTED NONE DETECTED   Barbiturates, Ur Screen NONE DETECTED NONE DETECTED   Benzodiazepine, Ur Scrn NONE DETECTED NONE DETECTED   Methadone Scn, Ur NONE DETECTED NONE DETECTED    Comment: (NOTE) Tricyclics + metabolites, urine    Cutoff 1000 ng/mL Amphetamines + metabolites, urine  Cutoff 1000 ng/mL MDMA (Ecstasy), urine              Cutoff 500 ng/mL Cocaine Metabolite, urine          Cutoff 300 ng/mL Opiate + metabolites, urine        Cutoff 300 ng/mL Phencyclidine (PCP), urine         Cutoff 25 ng/mL Cannabinoid, urine                 Cutoff 50 ng/mL Barbiturates + metabolites, urine  Cutoff 200 ng/mL Benzodiazepine, urine              Cutoff 200 ng/mL Methadone, urine                   Cutoff 300 ng/mL The urine drug screen provides only a preliminary, unconfirmed analytical test result and should not be used for non-medical purposes. Clinical consideration and professional judgment should be applied to any positive drug screen result due to possible interfering substances. A more specific alternate chemical method must be used in order to obtain a confirmed analytical result. Gas chromatography / mass spectrometry (GC/MS) is the preferred confirmat ory method. Performed at  [facility], [address] [place], [place] [address]   Urinalysis, Routine w reflex microscopic     Status: Abnormal   Collection Time: 02/07/19  5:58 PM  Result Value Ref Range   Color, Urine COLORLESS (A) YELLOW   APPearance CLEAR (A) CLEAR   Specific Gravity, Urine 1.002 (L) 1.005 - 1.030   pH 6.0 5.0 - 8.0   Glucose, UA NEGATIVE NEGATIVE mg/dL   Hgb urine dipstick NEGATIVE NEGATIVE   Bilirubin Urine NEGATIVE NEGATIVE   Ketones, ur NEGATIVE NEGATIVE mg/dL   Protein, ur NEGATIVE NEGATIVE mg/dL   Nitrite NEGATIVE NEGATIVE   Leukocytes,Ua NEGATIVE NEGATIVE    Comment: Performed at [facility], [address] [place], [place] [address]  SARS Coronavirus 2 [facility] order, Performed in [facility] hospital lab) Nasopharyngeal Nasopharyngeal Swab     Status: None   Collection Time: 02/08/19  2:31 AM   Specimen: Nasopharyngeal Swab  Result Value Ref Range   SARS Coronavirus 2 NEGATIVE NEGATIVE    Comment: (NOTE) If result is NEGATIVE SARS-CoV-2 target nucleic acids are NOT DETECTED. The SARS-CoV-2 RNA is generally detectable in upper and lower  respiratory specimens during the acute phase of infection. The lowest  concentration of SARS-CoV-2 viral copies this assay can detect is 250  copies / mL.  A negative result does not preclude SARS-CoV-2 infection  and should not be used as the sole basis for treatment or other  patient management decisions.  A negative result may occur with  improper specimen collection / handling, submission of specimen other  than nasopharyngeal swab, presence of viral mutation(s) within the  areas targeted by this assay, and inadequate number of viral copies  (<250 copies / mL). A negative result must be combined with clinical  observations, patient history, and epidemiological information. If result is POSITIVE SARS-CoV-2 target nucleic acids are DETECTED. The SARS-CoV-2 RNA is generally detectable in upper and lower   respiratory specimens dur ing the acute phase of infection.  Positive  results are indicative of active infection with SARS-CoV-2.  Clinical  correlation with patient history and other diagnostic information is  necessary to determine patient infection status.  Positive results do  not rule out bacterial infection or co-infection with other viruses. If result is PRESUMPTIVE POSTIVE SARS-CoV-2 nucleic acids MAY BE PRESENT.   A presumptive positive result was obtained on the submitted specimen  and confirmed on repeat testing.  While 2019 novel coronavirus  (SARS-CoV-2) nucleic acids may be present in the submitted sample  additional confirmatory testing may be necessary for epidemiological  and / or clinical management purposes  to differentiate between  SARS-CoV-2 and other Sarbecovirus currently known to infect humans.  If clinically indicated additional testing with an alternate test  methodology [id number]) is advised. The SARS-CoV-2 RNA is generally  detectable in upper and lower respiratory sp ecimens during the acute  phase of infection. The expected result is Negative. Fact Sheet for Patients:  StrictlyIdeas.no Fact Sheet for Healthcare Providers: BankingDealers.co.za This test is not yet approved or cleared by the [place] FDA and has been authorized for detection and/or diagnosis of SARS-CoV-2 by FDA under an Emergency Use Authorization (EUA).  This EUA will remain in effect (meaning this test can be used) for the duration of the COVID-19 declaration under Section 564(b)(1) of the Act, 21 U.S.C. section 360bbb-3(b)(1), unless the authorization is terminated or revoked sooner. Performed at [facility], [address] [place], [place] [address]   Lactic acid, plasma     Status: Abnormal   Collection Time: 02/08/19  2:31 AM  Result Value Ref Range   Lactic Acid, Venous 2.8 (HH) 0.5 - 1.9 mmol/L    Comment:  CRITICAL RESULT CALLED TO, READ BACK BY AND VERIFIED WITH SHERRIE Jakya Dovidio ON 02/08/19 AT [facility] Performed at [facility], [address] [place], [place] [address]    Dg Chest Portable 1 View  Result Date: 02/07/2019 CLINICAL DATA:  Shortness of breath EXAM: PORTABLE CHEST 1 VIEW COMPARISON:  Radiograph January 16, 2019 FINDINGS: No consolidation, features of edema, pneumothorax, or effusion. Pulmonary vascularity is normally distributed. The cardiomediastinal contours are unremarkable. No acute osseous or soft tissue abnormality. Degenerative changes are present in the imaged spine and shoulders. 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 IMPRESSION: No acute cardiopulmonary process. Electronically Signed   By: Lovena Le M.D.   On: 02/07/2019 19:58    Pending Labs Unresulted Labs (From admission, onward)    Start     Ordered   02/08/19 0225  Culture, blood (routine x 2)  BLOOD CULTURE X 2,   STAT     02/08/19 0224   02/08/19 0225  Lactic acid, plasma  Now then every 2 hours,   STAT  02/08/19 0224   Signed and Held  Creatinine, serum  (enoxaparin (LOVENOX)    CrCl >/= 30 ml/min)  Weekly,   R    Comments: while on enoxaparin therapy    Signed and Held   Signed and Held  TSH  Add-on,   R     Signed and Held          Vitals/Pain Today's Vitals   02/08/19 0045 02/08/19 0130 02/08/19 0145 02/08/19 0200  BP: 110/74 (!) 96/59 (!) 103/56 101/64  Pulse: 84 81 75 79  Resp:  17 15 20   Temp:      TempSrc:      SpO2: 100% 94% 95% 95%  Weight:      Height:      PainSc:        Isolation Precautions Airborne and Contact precautions  Medications Medications  LORazepam (ATIVAN) injection 2 mg (has no administration in time range)  aztreonam (AZACTAM) 2 g in sodium chloride 0.9 % 100 mL IVPB (2 g Intravenous New Bag/Given 02/08/19 0342)  metroNIDAZOLE (FLAGYL) IVPB 500 mg (has no administration in time range)   vancomycin (VANCOCIN) 1,750 mg in sodium chloride 0.9 % 500 mL IVPB (has no administration in time range)  ziprasidone (GEODON) injection 20 mg (20 mg Intramuscular Given 02/07/19 1700)  sodium chloride 0.9 % bolus 1,000 mL (0 mLs Intravenous Stopped 02/07/19 1951)  sodium chloride 0.9 % bolus 1,000 mL (0 mLs Intravenous Stopped 02/07/19 2300)  LORazepam (ATIVAN) injection 2 mg (2 mg Intravenous Given 02/07/19 2145)  LORazepam (ATIVAN) 2 MG/ML injection (2 mg Intravenous Given 02/07/19 2231)  sodium chloride 0.9 % bolus 1,000 mL (0 mLs Intravenous Stopped 02/08/19 0027)    Mobility walks High fall risk   Focused Assessments n/a   R Recommendations: See Admitting Provider Note  Report given to:   Additional Notes: n/a

## 2019-02-09 LAB — URINE CULTURE: Culture: NO GROWTH

## 2019-02-13 LAB — CULTURE, BLOOD (ROUTINE X 2)
Culture: NO GROWTH
Culture: NO GROWTH
Special Requests: ADEQUATE

## 2019-02-13 NOTE — Discharge Summary (Signed)
Omar Davis, is a 68 y.o. male  DOB [birth year]  MRN [id number].  Admission date:  02/07/2019  Admitting Physician  Harrie Foreman, MD  Discharge Date:  02/08/2019   Primary MD  Patient, No Pcp Per  Recommendations for primary care physician for things to follow:  Has no pcp,left Against medical advice. [facility]    Admission Diagnosis  Alcoholic intoxication without complication (HCC) 123456 Hypotension, unspecified hypotension type [I95.9] Sepsis, due to unspecified organism, unspecified whether acute organ dysfunction present [facility]) [A41.9]   Discharge Diagnosis  Alcoholic intoxication without complication ([place]) 123456 Hypotension, unspecified hypotension type [I95.9] Sepsis, due to unspecified organism, unspecified whether acute organ dysfunction present ([place]) [A41.9]    Active Problems:   Alcohol-induced mood disorder ([place])   Sepsis ([place])      Past Medical History:  Diagnosis Date  . Chronic pain   . Depressed   . Hypertension   . PTSD (post-traumatic stress disorder)   . TBI (traumatic brain injury) [facility])     Past Surgical History:  Procedure Laterality Date  . BACK SURGERY    . HERNIA REPAIR    . KNEE SURGERY    . KNEE SURGERY    . prostectomy N/A        History of present illness and  Hospital Course:     Kindly see H&P for history of present illness and admission details, please review complete Labs, Consult reports and Test reports for all details in brief  HPI  from the history and physical done on the day of admission   68 yr old male  With depression,seizure disorder  Admitted because of alcohol intoxication,  1.Alcohol intoxication ;admitted to ICU  For close monitroing of seizures/DT.pt reived IV fluids,ativan ,thiamine,folic acid/. Seen by PSychiatry,recommended  rehab,AA but pt declined. Pt signed out AMA.  Hospital Course   Alcohol intoxication ;admitted to ICU  For close monitroing of seizures/DT.pt reived IV fluids,ativan ,thiamine,folic acid/. Seen by PSychiatry,recommended rehab,AA but pt declined. Pt needed to be monitored for alcohol withdrawal  Symptoms and he signed out AMA. Depression: -Restarted Cymbalta 20 mg daily  Anxiety: -Continue hydroxyzine 10 mg TID PRN  Seizures: -Continue Depakote 1000 mg BID  Discharge Condition: stable   Follow UP      Discharge Instructions  and  Discharge Medications      Allergies as of 02/08/2019      Reactions   Codeine Hives, Rash   Gabapentin Other (See Comments), Anaphylaxis   seizures Other reaction(s): Other (See Comments) seizures Anaphylactic Shock   Penicillins Rash   Other reaction(s): Other (See Comments) Other Reaction: Not Assessed Rash   Lisinopril Swelling   Pt reports lip swelling      Medication List    ASK your doctor about these medications   divalproex 500 MG 24 hr tablet Commonly known as: DEPAKOTE ER Take 2 tablets (1,000 mg total) by mouth 2 (two) times daily.   DULoxetine 30 MG capsule Commonly known as: CYMBALTA Take 1 capsule (30 mg total) by mouth daily.   lisinopril 10 MG tablet Commonly known as: ZESTRIL Take 1 tablet (10 mg total) by mouth daily.   naproxen 500 MG tablet Commonly known as: NAPROSYN Take 1 tablet (500 mg total) by mouth 2 (two) times daily as needed for moderate pain.   pravastatin 80 MG tablet Commonly known as: PRAVACHOL Take 1 tablet (80 mg total) by mouth at bedtime.   thiamine 100 MG tablet Take 1 tablet (  100 mg total) by mouth daily.         Diet and Activity recommendation: See Discharge Instructions above   Consults obtained - psychiatry   Major procedures and Radiology Reports - PLEASE review detailed and final reports for all details, in brief -      Ct Head Wo Contrast  Result Date:  01/16/2019 CLINICAL DATA:  Stinging all over. EXAM: CT HEAD WITHOUT CONTRAST TECHNIQUE: Contiguous axial images were obtained from the base of the skull through the vertex without intravenous contrast. COMPARISON:  None available FINDINGS: Brain: No evidence of acute infarction, hydrocephalus, extra-axial collection or mass lesion/mass effect. Remote right-sided craniotomy with associated dural thickening measuring up to 4 mm. No related brain mass effect. Mild volume loss and periventricular low-density. Vascular: No hyperdense vessel or unexpected calcification. Skull: Unremarkable right-sided craniotomy. Sinuses/Orbits: Negative IMPRESSION: Mild (4 mm thick) dural/sub dural thickening along a right-sided craniotomy flap without cerebral mass effect, probably baseline although no comparison imaging report. No suspected acute process. Electronically Signed   By: Monte Fantasia M.D.   On: 01/16/2019 06:21   Dg Chest Portable 1 View  Result Date: 02/07/2019 CLINICAL DATA:  Shortness of breath EXAM: PORTABLE CHEST 1 VIEW COMPARISON:  Radiograph January 16, 2019 FINDINGS: No consolidation, features of edema, pneumothorax, or effusion. Pulmonary vascularity is normally distributed. The cardiomediastinal contours are unremarkable. No acute osseous or soft tissue abnormality. Degenerative changes are present in the imaged spine and shoulders. 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 IMPRESSION: No acute cardiopulmonary process. Electronically Signed   By: Lovena Le M.D.   On: 02/07/2019 19:58   Dg Chest Port 1 View  Result Date: 01/16/2019 CLINICAL DATA:  Stinging all over EXAM: PORTABLE CHEST 1 VIEW COMPARISON:  None available FINDINGS: Normal heart size and mediastinal contours. No acute infiltrate or edema. Borderline linear opacity at the left base from scarring or atelectasis. No effusion or pneumothorax. No acute osseous findings. 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 IMPRESSION: No acute finding. Electronically Signed   By: Monte Fantasia M.D.   On: 01/16/2019 05:47    Micro Results     Recent Results (from the past 240 hour(s))  Urine Culture     Status: None   Collection Time: 02/07/19  5:58 PM   Specimen: Urine, Random  Result Value Ref Range Status   Specimen Description   Final    URINE, RANDOM Performed at [facility], [address], [place], [place] [address]    Special Requests   Final    NONE Performed at [facility], [address], [place], [place] [address]    Culture   Final    NO GROWTH Performed at [facility], [place] [address] [place], [place] [address]    Report Status 02/09/2019 FINAL  Final  SARS Coronavirus 2 [facility] order, Performed in [facility] hospital lab) Nasopharyngeal Nasopharyngeal Swab     Status: None   Collection Time: 02/08/19  2:31 AM   Specimen: Nasopharyngeal Swab  Result Value Ref Range Status   SARS Coronavirus 2 NEGATIVE NEGATIVE Final    Comment: (NOTE) If result is NEGATIVE SARS-CoV-2 target nucleic acids are NOT DETECTED. The SARS-CoV-2 RNA is generally detectable in upper and lower  respiratory specimens during the acute phase of infection. The lowest  concentration of SARS-CoV-2 viral copies this assay can detect is 250  copies / mL. A negative result does not preclude SARS-CoV-2  infection  and should not be used as the sole basis for treatment or other  patient management decisions.  A negative result may occur with  improper specimen collection / handling, submission of specimen other  than nasopharyngeal swab, presence of viral mutation(s) within the  areas targeted by this assay, and inadequate number of viral copies  (<250 copies / mL). A negative result must be combined with clinical  observations, patient history, and epidemiological information. If result is POSITIVE SARS-CoV-2 target nucleic acids are DETECTED. The  SARS-CoV-2 RNA is generally detectable in upper and lower  respiratory specimens dur ing the acute phase of infection.  Positive  results are indicative of active infection with SARS-CoV-2.  Clinical  correlation with patient history and other diagnostic information is  necessary to determine patient infection status.  Positive results do  not rule out bacterial infection or co-infection with other viruses. If result is PRESUMPTIVE POSTIVE SARS-CoV-2 nucleic acids MAY BE PRESENT.   A presumptive positive result was obtained on the submitted specimen  and confirmed on repeat testing.  While 2019 novel coronavirus  (SARS-CoV-2) nucleic acids may be present in the submitted sample  additional confirmatory testing may be necessary for epidemiological  and / or clinical management purposes  to differentiate between  SARS-CoV-2 and other Sarbecovirus currently known to infect humans.  If clinically indicated additional testing with an alternate test  methodology [phone]) is advised. The SARS-CoV-2 RNA is generally  detectable in upper and lower respiratory sp ecimens during the acute  phase of infection. The expected result is Negative. Fact Sheet for Patients:  StrictlyIdeas.no Fact Sheet for Healthcare Providers: BankingDealers.co.za This test is not yet approved or cleared by the [place] FDA and has been authorized for detection and/or diagnosis of SARS-CoV-2 by FDA under an Emergency Use Authorization (EUA).  This EUA will remain in effect (meaning this test can be used) for the duration of the COVID-19 declaration under Section 564(b)(1) of the Act, 21 U.S.C. section 360bbb-3(b)(1), unless the authorization is terminated or revoked sooner. Performed at [facility], [address] [place], [place] [address]   Culture, blood (routine x 2)     Status: None   Collection Time: 02/08/19  2:31 AM   Specimen: BLOOD  Result  Value Ref Range Status   Specimen Description BLOOD RIGHT ASSIST CONTROL  Final   Special Requests   Final    BOTTLES DRAWN AEROBIC AND ANAEROBIC Blood Culture adequate volume   Culture   Final    NO GROWTH 5 DAYS Performed at [facility], [address] [place], [place] [address]    Report Status 02/13/2019 FINAL  Final  Culture, blood (routine x 2)     Status: None   Collection Time: 02/08/19  2:31 AM   Specimen: BLOOD  Result Value Ref Range Status   Specimen Description BLOOD LEFT ASSIST CONTROL  Final   Special Requests   Final    BOTTLES DRAWN AEROBIC AND ANAEROBIC Blood Culture results may not be optimal due to an excessive volume of blood received in culture bottles   Culture   Final    NO GROWTH 5 DAYS Performed at [facility], [address], [place], [place] [address]    Report Status 02/13/2019 FINAL  Final  MRSA PCR Screening     Status: None   Collection Time: 02/08/19  4:40 AM   Specimen: Nasal Mucosa; Nasopharyngeal  Result Value Ref Range Status   MRSA by PCR  NEGATIVE NEGATIVE Final    Comment:        The GeneXpert MRSA Assay (FDA approved for NASAL specimens only), is one component of a comprehensive MRSA colonization surveillance program. It is not intended to diagnose MRSA infection nor to guide or monitor treatment for MRSA infections. Performed at [facility], [address], [place], [place] [address]        Today   Subjective:   Omar Davis  Left Against medical advice.  Objective:   Blood pressure (!) 149/79, pulse 86, temperature 98.8 F (37.1 C), temperature source Oral, resp. rate 12, height 5\' 6"  (1.676 m), weight 79.1 kg, SpO2 97 %.  No intake or output data in the 24 hours ending 02/13/19 2138  Exam Awake Alert, Oriented x 3, No new F.N deficits, Normal affect [place].AT,PERRAL Supple Neck,No JVD, No cervical lymphadenopathy appriciated.  Symmetrical Chest wall movement, Good air movement  bilaterally, CTAB RRR,No Gallops,Rubs or new Murmurs, No Parasternal Heave +ve B.Sounds, Abd Soft, Non tender, No organomegaly appriciated, No rebound -guarding or rigidity. No Cyanosis, Clubbing or edema, No new Rash or bruise  Data Review   CBC w Diff:  Lab Results  Component Value Date   WBC 8.5 02/08/2019   HGB 11.3 (L) 02/08/2019   HGB 13.5 05/12/2014   HCT 39.4 02/08/2019   HCT 42.4 05/12/2014   PLT 261 02/08/2019   PLT 113 (L) 05/12/2014   LYMPHOPCT 9 02/07/2019   LYMPHOPCT 23.6 05/12/2014   MONOPCT 6 02/07/2019   MONOPCT 12.9 05/12/2014   EOSPCT 1 02/07/2019   EOSPCT 10.0 05/12/2014   BASOPCT 1 02/07/2019   BASOPCT 0.7 05/12/2014    CMP:  Lab Results  Component Value Date   NA 143 02/08/2019   NA 148 (H) 05/12/2014   K 4.4 02/08/2019   K 4.5 05/12/2014   CL 117 (H) 02/08/2019   CL 116 (H) 05/12/2014   CO2 21 (L) 02/08/2019   CO2 24 05/12/2014   BUN 30 (H) 02/08/2019   BUN 24 (H) 05/12/2014   CREATININE 1.11 02/08/2019   CREATININE 1.65 (H) 05/12/2014   PROT 6.5 02/07/2019   PROT 6.6 02/16/2014   ALBUMIN 3.5 02/07/2019   ALBUMIN 3.1 (L) 02/16/2014   BILITOT 0.5 02/07/2019   BILITOT 0.5 02/16/2014   ALKPHOS 99 02/07/2019   ALKPHOS 70 02/16/2014   AST 19 02/07/2019   AST 39 (H) 02/16/2014   ALT 19 02/07/2019   ALT 40 02/16/2014  .   Total Time in preparing paper work, data evaluation and todays exam - 35 minutes  Epifanio Lesches M.D on 02/08/2019 at 9:38 PM    Note: This dictation was prepared with Dragon dictation along with smaller phrase technology. Any transcriptional errors that result from this process are unintentional.

## 2019-02-19 ENCOUNTER — Other Ambulatory Visit: Payer: Self-pay

## 2019-02-19 ENCOUNTER — Encounter: Payer: Self-pay | Admitting: Emergency Medicine

## 2019-02-19 ENCOUNTER — Emergency Department
Admission: EM | Admit: 2019-02-19 | Discharge: 2019-02-19 | Disposition: A | Discharge status: Home / Self Care | Payer: No Typology Code available for payment source | Attending: Emergency Medicine | Admitting: Emergency Medicine

## 2019-02-19 ENCOUNTER — Emergency Department
Admission: EM | Admit: 2019-02-19 | Discharge: 2019-02-20 | Disposition: A | Discharge status: Home / Self Care | Payer: No Typology Code available for payment source | Source: Home / Self Care | Attending: Emergency Medicine | Admitting: Emergency Medicine

## 2019-02-19 DIAGNOSIS — F1010 Alcohol abuse, uncomplicated: Secondary | ICD-10-CM

## 2019-02-19 DIAGNOSIS — Z79899 Other long term (current) drug therapy: Secondary | ICD-10-CM | POA: Insufficient documentation

## 2019-02-19 DIAGNOSIS — I10 Essential (primary) hypertension: Secondary | ICD-10-CM | POA: Diagnosis present

## 2019-02-19 DIAGNOSIS — Y908 Blood alcohol level of 240 mg/100 ml or more: Secondary | ICD-10-CM | POA: Insufficient documentation

## 2019-02-19 DIAGNOSIS — Y906 Blood alcohol level of 120-199 mg/100 ml: Secondary | ICD-10-CM | POA: Insufficient documentation

## 2019-02-19 DIAGNOSIS — F10220 Alcohol dependence with intoxication, uncomplicated: Secondary | ICD-10-CM | POA: Diagnosis present

## 2019-02-19 DIAGNOSIS — F329 Major depressive disorder, single episode, unspecified: Secondary | ICD-10-CM | POA: Insufficient documentation

## 2019-02-19 DIAGNOSIS — F1094 Alcohol use, unspecified with alcohol-induced mood disorder: Secondary | ICD-10-CM | POA: Diagnosis present

## 2019-02-19 DIAGNOSIS — F1020 Alcohol dependence, uncomplicated: Secondary | ICD-10-CM | POA: Diagnosis present

## 2019-02-19 DIAGNOSIS — R451 Restlessness and agitation: Secondary | ICD-10-CM | POA: Diagnosis present

## 2019-02-19 DIAGNOSIS — Z046 Encounter for general psychiatric examination, requested by authority: Secondary | ICD-10-CM

## 2019-02-19 DIAGNOSIS — R45851 Suicidal ideations: Secondary | ICD-10-CM

## 2019-02-19 DIAGNOSIS — A419 Sepsis, unspecified organism: Secondary | ICD-10-CM | POA: Diagnosis present

## 2019-02-19 DIAGNOSIS — F332 Major depressive disorder, recurrent severe without psychotic features: Secondary | ICD-10-CM | POA: Diagnosis present

## 2019-02-19 DIAGNOSIS — F10920 Alcohol use, unspecified with intoxication, uncomplicated: Secondary | ICD-10-CM

## 2019-02-19 DIAGNOSIS — F10120 Alcohol abuse with intoxication, uncomplicated: Secondary | ICD-10-CM | POA: Insufficient documentation

## 2019-02-19 DIAGNOSIS — G8929 Other chronic pain: Secondary | ICD-10-CM | POA: Diagnosis present

## 2019-02-19 DIAGNOSIS — F4310 Post-traumatic stress disorder, unspecified: Secondary | ICD-10-CM | POA: Diagnosis present

## 2019-02-19 DIAGNOSIS — F32A Depression, unspecified: Secondary | ICD-10-CM | POA: Diagnosis present

## 2019-02-19 LAB — SALICYLATE LEVEL
Salicylate Lvl: <7 mg/dL (ref 2.8–30.0)
Salicylate Lvl: <7 mg/dL (ref 2.8–30.0)

## 2019-02-19 LAB — CBC
HCT: 38.4 % — ABNORMAL LOW (ref 39.0–52.0)
Hemoglobin: 11.4 g/dL — ABNORMAL LOW (ref 13.0–17.0)
MCH: 20.4 pg — ABNORMAL LOW (ref 26.0–34.0)
MCHC: 29.7 g/dL — ABNORMAL LOW (ref 30.0–36.0)
MCV: 68.7 fL — ABNORMAL LOW (ref 80.0–100.0)
Platelets: 362 10*3/uL (ref 150–400)
RBC: 5.59 MIL/uL (ref 4.22–5.81)
RDW: 23 % — ABNORMAL HIGH (ref 11.5–15.5)
WBC: 9 10*3/uL (ref 4.0–10.5)
nRBC: 0 % (ref 0.0–0.2)

## 2019-02-19 LAB — COMPREHENSIVE METABOLIC PANEL
ALT: 17 U/L (ref 0–44)
ALT: 20 U/L (ref 0–44)
AST: 22 U/L (ref 15–41)
AST: 26 U/L (ref 15–41)
Albumin: 3.5 g/dL (ref 3.5–5.0)
Albumin: 3.8 g/dL (ref 3.5–5.0)
Alkaline Phosphatase: 69 U/L (ref 38–126)
Alkaline Phosphatase: 84 U/L (ref 38–126)
Anion gap: 9 (ref 5–15)
Anion gap: 9 (ref 5–15)
BUN: 19 mg/dL (ref 8–23)
BUN: 15 mg/dL (ref 8–23)
CO2: 23 mmol/L (ref 22–32)
CO2: 24 mmol/L (ref 22–32)
Calcium: 8.6 mg/dL — ABNORMAL LOW (ref 8.9–10.3)
Calcium: 9.1 mg/dL (ref 8.9–10.3)
Chloride: 107 mmol/L (ref 98–111)
Chloride: 109 mmol/L (ref 98–111)
Creatinine, Ser: 1.32 mg/dL — ABNORMAL HIGH (ref 0.61–1.24)
Creatinine, Ser: 1.15 mg/dL (ref 0.61–1.24)
GFR calc Af Amer: >60 mL/min (ref >60)
GFR calc Af Amer: >60 mL/min (ref >60)
GFR calc non Af Amer: 54 mL/min — ABNORMAL LOW (ref >60)
GFR calc non Af Amer: >60 mL/min (ref >60)
Glucose, Bld: 107 mg/dL — ABNORMAL HIGH (ref 70–99)
Glucose, Bld: 110 mg/dL — ABNORMAL HIGH (ref 70–99)
Potassium: 3.9 mmol/L (ref 3.5–5.1)
Potassium: 4 mmol/L (ref 3.5–5.1)
Sodium: 139 mmol/L (ref 135–145)
Sodium: 142 mmol/L (ref 135–145)
Total Bilirubin: 0.4 mg/dL (ref 0.3–1.2)
Total Bilirubin: 0.5 mg/dL (ref 0.3–1.2)
Total Protein: 6 g/dL — ABNORMAL LOW (ref 6.5–8.1)
Total Protein: 6.8 g/dL (ref 6.5–8.1)

## 2019-02-19 LAB — URINE DRUG SCREEN, QUALITATIVE (ARMC ONLY)
Amphetamines, Ur Screen: NOT DETECTED
Amphetamines, Ur Screen: NOT DETECTED
Barbiturates, Ur Screen: NOT DETECTED
Barbiturates, Ur Screen: NOT DETECTED
Benzodiazepine, Ur Scrn: NOT DETECTED
Benzodiazepine, Ur Scrn: NOT DETECTED
Cannabinoid 50 Ng, Ur ~~LOC~~: NOT DETECTED
Cannabinoid 50 Ng, Ur ~~LOC~~: NOT DETECTED
Cocaine Metabolite,Ur ~~LOC~~: NOT DETECTED
Cocaine Metabolite,Ur ~~LOC~~: NOT DETECTED
MDMA (Ecstasy)Ur Screen: NOT DETECTED
MDMA (Ecstasy)Ur Screen: NOT DETECTED
Methadone Scn, Ur: NOT DETECTED
Methadone Scn, Ur: NOT DETECTED
Opiate, Ur Screen: NOT DETECTED
Opiate, Ur Screen: NOT DETECTED
Phencyclidine (PCP) Ur S: NOT DETECTED
Phencyclidine (PCP) Ur S: NOT DETECTED
Tricyclic, Ur Screen: NOT DETECTED
Tricyclic, Ur Screen: NOT DETECTED

## 2019-02-19 LAB — CBC WITH DIFFERENTIAL/PLATELET
Abs Immature Granulocytes: 0.34 10*3/uL — ABNORMAL HIGH (ref 0.00–0.07)
Basophils Absolute: 0.1 10*3/uL (ref 0.0–0.1)
Basophils Relative: 1 %
Eosinophils Absolute: 0.3 10*3/uL (ref 0.0–0.5)
Eosinophils Relative: 4 %
HCT: 34.9 % — ABNORMAL LOW (ref 39.0–52.0)
Hemoglobin: 10.2 g/dL — ABNORMAL LOW (ref 13.0–17.0)
Immature Granulocytes: 5 %
Lymphocytes Relative: 21 %
Lymphs Abs: 1.6 10*3/uL (ref 0.7–4.0)
MCH: 20.2 pg — ABNORMAL LOW (ref 26.0–34.0)
MCHC: 29.2 g/dL — ABNORMAL LOW (ref 30.0–36.0)
MCV: 69.1 fL — ABNORMAL LOW (ref 80.0–100.0)
Monocytes Absolute: 1 10*3/uL (ref 0.1–1.0)
Monocytes Relative: 13 %
Neutro Abs: 4.3 10*3/uL (ref 1.7–7.7)
Neutrophils Relative %: 56 %
Platelets: 283 10*3/uL (ref 150–400)
RBC: 5.05 MIL/uL (ref 4.22–5.81)
RDW: 22.3 % — ABNORMAL HIGH (ref 11.5–15.5)
Smear Review: NORMAL
WBC: 7.6 10*3/uL (ref 4.0–10.5)
nRBC: 0 % (ref 0.0–0.2)

## 2019-02-19 LAB — ETHANOL
Alcohol, Ethyl (B): 179 mg/dL — ABNORMAL HIGH (ref <10)
Alcohol, Ethyl (B): 243 mg/dL — ABNORMAL HIGH (ref <10)

## 2019-02-19 LAB — VALPROIC ACID LEVEL: Valproic Acid Lvl: <10 ug/mL — ABNORMAL LOW (ref 50.0–100.0)

## 2019-02-19 LAB — ACETAMINOPHEN LEVEL
Acetaminophen (Tylenol), Serum: <10 ug/mL — ABNORMAL LOW (ref 10–30)
Acetaminophen (Tylenol), Serum: <10 ug/mL — ABNORMAL LOW (ref 10–30)

## 2019-02-19 MED ORDER — VITAMIN B-1 100 MG PO TABS
100.0000 mg | ORAL_TABLET | Freq: Every day | ORAL | Status: DC
  Administered 2019-02-19: 11:00:00 100 mg via ORAL
  Filled 2019-02-19: qty 1

## 2019-02-19 MED ORDER — PRAVASTATIN SODIUM 40 MG PO TABS
80.0000 mg | ORAL_TABLET | Freq: Every day | ORAL | Status: DC
  Filled 2019-02-19: qty 2

## 2019-02-19 MED ORDER — DULOXETINE HCL 30 MG PO CPEP
30.0000 mg | ORAL_CAPSULE | Freq: Every day | ORAL | Status: DC
  Administered 2019-02-19: 30 mg via ORAL
  Filled 2019-02-19: qty 1

## 2019-02-19 MED ORDER — DIVALPROEX SODIUM ER 250 MG PO TB24
1000.0000 mg | ORAL_TABLET | Freq: Two times a day (BID) | ORAL | Status: DC
  Administered 2019-02-19: 1000 mg via ORAL
  Filled 2019-02-19: qty 4

## 2019-02-19 NOTE — ED Notes (Signed)
Pt states he slept well, feels good. Given breakfast.

## 2019-02-19 NOTE — ED Provider Notes (Signed)
-----------------------------------------   12:09 PM on 02/19/2019 -----------------------------------------  Patient has been seen and evaluated by psychiatry.  They believe the patient is safe for discharge home from a psychiatric standpoint.  Medically the patient's alcohol level is elevated otherwise largely baseline labs for the patient.  It is been approximately 8 hours since that lab was drawn and I believe the patient is safe for discharge home from a medical standpoint as well.   Harvest Dark, MD 02/19/19 1210

## 2019-02-19 NOTE — ED Notes (Signed)
Pt given breakfast tray

## 2019-02-19 NOTE — ED Triage Notes (Signed)
Patient here from hotel after staff called [employer]. EMS states patient told PD that he wanted to hurt himself. Patient states he drank 7 "Boot Leggers" and took 4 of his 100mg  Trazadone and (2) 25mg  Hydroxazine. Patient sleepy but answers questions without difficulty.

## 2019-02-19 NOTE — ED Provider Notes (Signed)
[facility] Emergency Department Provider Note   ____________________________________________   I have reviewed the triage vital signs and the nursing notes.   HISTORY  Chief Complaint Psychiatric Evaluation   History limited by: Not Limited   HPI Omar Davis is a 68 y.o. male who presents to the emergency department today under IVC because of concerns for suicidal ideation.  The patient does admit to drinking today.  He had been discharged from the emergency department earlier today for similar complaints.  At the time my exam the patient denies any SI.  He denies any recent illness.   Records reviewed. Per medical record review patient has a history of visit to emergency department recently with discharge yesterday  Past Medical History:  Diagnosis Date  . Chronic pain   . Depressed   . Hypertension   . PTSD (post-traumatic stress disorder)   . TBI (traumatic brain injury) [facility])     Patient Active Problem List   Diagnosis Date Noted  . Sepsis ([place]) 02/08/2019  . Essential hypertension 02/05/2019  . Alcohol-induced mood disorder ([place]) 01/24/2019  . Alcohol dependence with intoxication, uncomplicated ([place]) [id number]  . Major depressive disorder, recurrent severe without psychotic features ([place]) 08/24/2018  . Suicidal ideation 09/25/2015  . Alcohol abuse 09/09/2015  . PTSD (post-traumatic stress disorder) 09/09/2015  . Agitation 09/09/2015  . Depressed   . Chronic pain     Past Surgical History:  Procedure Laterality Date  . BACK SURGERY    . HERNIA REPAIR    . KNEE SURGERY    . KNEE SURGERY    . prostectomy N/A     Prior to Admission medications   Medication Sig Start Date End Date Taking? Authorizing Provider  divalproex (DEPAKOTE ER) 500 MG 24 hr tablet Take 2 tablets (1,000 mg total) by mouth 2 (two) times daily. 02/07/19  Yes Clapacs, Madie Reno, MD  DULoxetine (CYMBALTA) 30 MG capsule Take 1 capsule (30 mg total) by mouth  daily. 02/07/19  Yes Clapacs, Madie Reno, MD  lisinopril (ZESTRIL) 10 MG tablet Take 1 tablet (10 mg total) by mouth daily. 02/07/19  Yes Clapacs, Madie Reno, MD  naproxen (NAPROSYN) 500 MG tablet Take 1 tablet (500 mg total) by mouth 2 (two) times daily as needed for moderate pain. 02/07/19  Yes Clapacs, Madie Reno, MD  pravastatin (PRAVACHOL) 80 MG tablet Take 1 tablet (80 mg total) by mouth at bedtime. 02/07/19  Yes Clapacs, Madie Reno, MD  thiamine 100 MG tablet Take 1 tablet (100 mg total) by mouth daily. 02/07/19  Yes Clapacs, Madie Reno, MD    Allergies Codeine, Gabapentin, Penicillins, and Lisinopril  No family history on file.  Social History Social History   Tobacco Use  . Smoking status: Never Smoker  . Smokeless tobacco: Never Used  Substance Use Topics  . Alcohol use: Yes    Comment: 1 pint of vodka  . Drug use: No    Review of Systems Constitutional: No fever/chills Eyes: No visual changes. ENT: No sore throat. Cardiovascular: Denies chest pain. Respiratory: Denies shortness of breath. Gastrointestinal: No abdominal pain.  No nausea, no vomiting.  No diarrhea.   Genitourinary: Negative for dysuria. Musculoskeletal: Negative for back pain. Skin: Negative for rash. Neurological: Negative for headaches, focal weakness or numbness.  ____________________________________________   PHYSICAL EXAM:  VITAL SIGNS: ED Triage Vitals  Enc Vitals Group     BP 02/19/19 2052 111/84     Pulse Rate 02/19/19 2052 90  Resp 02/19/19 2052 20     Temp 02/19/19 2052 98.6 F (37 C)     Temp Source 02/19/19 2052 Oral     SpO2 02/19/19 2052 95 %     Weight 02/19/19 2053 174 lb 6.1 oz (79.1 kg)     Height 02/19/19 2053 5\' 6"  (1.676 m)     Head Circumference --      Peak Flow --      Pain Score 02/19/19 2052 7   Constitutional: Alert and oriented.  Eyes: Conjunctivae are normal.  ENT      Head: Normocephalic and atraumatic.      Nose: No congestion/rhinnorhea.      Mouth/Throat: Mucous  membranes are moist.      Neck: No stridor. Hematological/Lymphatic/Immunilogical: No cervical lymphadenopathy. Cardiovascular: Normal rate, regular rhythm.  No murmurs, rubs, or gallops.  Respiratory: Normal respiratory effort without tachypnea nor retractions. Breath sounds are clear and equal bilaterally. No wheezes/rales/rhonchi. Gastrointestinal: Soft and non tender. No rebound. No guarding.  Genitourinary: Deferred Musculoskeletal: Normal range of motion in all extremities. No lower extremity edema. Neurologic:  Normal speech and language. No gross focal neurologic deficits are appreciated.  Skin:  Skin is warm, dry and intact. No rash noted. Psychiatric: Mood and affect are normal. Speech and behavior are normal. Patient exhibits appropriate insight and judgment.  ____________________________________________    LABS (pertinent positives/negatives)  UDS negative CBC wbc 9.0, hgb 99991111, plt [id number] Salicylate, acetaminophen below threshold Ethanol 243 CMP wnl except glu 110  ____________________________________________   EKG  None  ____________________________________________    RADIOLOGY  None  ____________________________________________   PROCEDURES  Procedures  ____________________________________________   INITIAL IMPRESSION / ASSESSMENT AND PLAN / ED COURSE  Pertinent labs & imaging results that were available during my care of the patient were reviewed by me and considered in my medical decision making (see chart for details).   Patient presented to the emergency department today under IVC because of concerns for alcohol abuse and suicidal ideation.  On exam patient denies any SI.  Will have psychiatry evaluate.   ____________________________________________   FINAL CLINICAL IMPRESSION(S) / ED DIAGNOSES  Final diagnoses:  Alcohol abuse  Involuntary commitment     Note: This dictation was prepared with Dragon dictation. Any transcriptional errors  that result from this process are unintentional     Nance Pear, MD 02/19/19 2304

## 2019-02-19 NOTE — ED Triage Notes (Signed)
Patient to the ER for c/o needing psych eval. Patient has IVC papers that state patient verbalized wanting to die.

## 2019-02-19 NOTE — ED Notes (Signed)
Belongings: Pair of shoes, pair of socks, button down shirt, pants, pair of underwear, wallet with $19 and 1 penny.  Glasses to remain with patient.

## 2019-02-19 NOTE — ED Notes (Signed)
Hourly rounding reveals patient in room. No complaints, stable, in no acute distress. Q15 minute rounds and monitoring via Rover and Officer to continue.   

## 2019-02-19 NOTE — ED Notes (Signed)
Pair of black sneakers, grey camo pants with brown belt, multicolored shirt and blue checked boxers.

## 2019-02-19 NOTE — ED Notes (Signed)
Pt. Transferred from Triage to room after dressing out and screening for contraband. Pt. Oriented to Quad including Q15 minute rounds as well as Rover and Officer for their protection. Patient is alert and oriented, warm and dry in no acute distress. Patient denies SI, HI, and AVH. Pt. Encouraged to let me know if needs arise.  

## 2019-02-19 NOTE — Discharge Instructions (Addendum)
You have been seen in the emergency department for a  psychiatric concern. You have been evaluated both medically as well as psychiatrically. Please follow-up with your outpatient resources provided. Return to the emergency department for any worsening symptoms, or any thoughts of hurting yourself or anyone else so that we may attempt to help you. 

## 2019-02-19 NOTE — ED Notes (Addendum)
IVC resinded. Patient evaluated by psych and discharged to home.

## 2019-02-19 NOTE — ED Provider Notes (Signed)
[facility] Emergency Department Provider Note   ____________________________________________   First MD Initiated Contact with Patient 02/19/19 [phone]     (approximate)  I have reviewed the triage vital signs and the nursing notes.   HISTORY  Chief Complaint Medical Clearance (SI with some ETOH and patients prescribed medications.)    HPI Omar Davis is a 67 y.o. male brought to the ED by EMS for alcohol intoxication and suicidal ideation.  Patient is well-known to the emergency department for same.  States his girlfriend threw him out of the house and he mixed alcohol with his medications.  Reportedly IVC papers are en route.  Voices no medical complaints.       Past Medical History:  Diagnosis Date  . Chronic pain   . Depressed   . Hypertension   . PTSD (post-traumatic stress disorder)   . TBI (traumatic brain injury) [facility])     Patient Active Problem List   Diagnosis Date Noted  . Sepsis ([place]) 02/08/2019  . Essential hypertension 02/05/2019  . Alcohol-induced mood disorder ([place]) 01/24/2019  . Alcohol dependence with intoxication, uncomplicated ([place]) [id number]  . Major depressive disorder, recurrent severe without psychotic features ([place]) 08/24/2018  . Suicidal ideation 09/25/2015  . Alcohol abuse 09/09/2015  . PTSD (post-traumatic stress disorder) 09/09/2015  . Agitation 09/09/2015  . Depressed   . Chronic pain     Past Surgical History:  Procedure Laterality Date  . BACK SURGERY    . HERNIA REPAIR    . KNEE SURGERY    . KNEE SURGERY    . prostectomy N/A     Prior to Admission medications   Medication Sig Start Date End Date Taking? Authorizing Provider  divalproex (DEPAKOTE ER) 500 MG 24 hr tablet Take 2 tablets (1,000 mg total) by mouth 2 (two) times daily. 02/07/19   Clapacs, Madie Reno, MD  DULoxetine (CYMBALTA) 30 MG capsule Take 1 capsule (30 mg total) by mouth daily. 02/07/19   Clapacs, Madie Reno, MD  lisinopril (ZESTRIL)  10 MG tablet Take 1 tablet (10 mg total) by mouth daily. 02/07/19   Clapacs, Madie Reno, MD  naproxen (NAPROSYN) 500 MG tablet Take 1 tablet (500 mg total) by mouth 2 (two) times daily as needed for moderate pain. 02/07/19   Clapacs, Madie Reno, MD  pravastatin (PRAVACHOL) 80 MG tablet Take 1 tablet (80 mg total) by mouth at bedtime. 02/07/19   Clapacs, Madie Reno, MD  thiamine 100 MG tablet Take 1 tablet (100 mg total) by mouth daily. 02/07/19   Clapacs, Madie Reno, MD    Allergies Codeine, Gabapentin, Penicillins, and Lisinopril  No family history on file.  Social History Social History   Tobacco Use  . Smoking status: Never Smoker  . Smokeless tobacco: Never Used  Substance Use Topics  . Alcohol use: Yes    Comment: 1 pint of vodka  . Drug use: No    Review of Systems  Constitutional: No fever/chills Eyes: No visual changes. ENT: No sore throat. Cardiovascular: Denies chest pain. Respiratory: Denies shortness of breath. Gastrointestinal: No abdominal pain.  No nausea, no vomiting.  No diarrhea.  No constipation. Genitourinary: Negative for dysuria. Musculoskeletal: Negative for back pain. Skin: Negative for rash. Neurological: Negative for headaches, focal weakness or numbness. Psychiatric:  Positive for intoxication, depression with SI.  ____________________________________________   PHYSICAL EXAM:  VITAL SIGNS: ED Triage Vitals [02/19/19 0437]  Enc Vitals Group     BP (!) 91/53  Pulse Rate 84     Resp 12     Temp 98 F (36.7 C)     Temp Source Oral     SpO2 93 %     Weight      Height      Head Circumference      Peak Flow      Pain Score      Pain Loc      Pain Edu?      Excl. in [place]?     Constitutional: Alert and oriented.  Disheveled appearing and in no acute distress. Eyes: Conjunctivae are normal. PERRL. EOMI. Head: Atraumatic. Nose: No congestion/rhinnorhea. Mouth/Throat: Mucous membranes are moist.  Oropharynx non-erythematous. Neck: No stridor.    Cardiovascular: Normal rate, regular rhythm. Grossly normal heart sounds.  Good peripheral circulation. Respiratory: Normal respiratory effort.  No retractions. Lungs CTAB. Gastrointestinal: Soft and nontender. No distention. No abdominal bruits. No CVA tenderness. Musculoskeletal: No lower extremity tenderness nor edema.  No joint effusions. Neurologic:  Normal speech and language. No gross focal neurologic deficits are appreciated. No gait instability. Skin:  Skin is warm, dry and intact. No rash noted. Psychiatric: Mood and affect are intoxicated. Speech and behavior are normal.  ____________________________________________   LABS (all labs ordered are listed, but only abnormal results are displayed)  Labs Reviewed  CBC WITH DIFFERENTIAL/PLATELET  COMPREHENSIVE METABOLIC PANEL  ETHANOL  ACETAMINOPHEN LEVEL  SALICYLATE LEVEL  URINE DRUG SCREEN, QUALITATIVE ([place] ONLY)  VALPROIC ACID LEVEL   ____________________________________________  EKG  ED ECG REPORT I, Ian Castagna J, the attending physician, personally viewed and interpreted this ECG.   Date: 02/19/2019  EKG Time: 0504  Rate: 81  Rhythm: normal EKG, normal sinus rhythm  Axis: Normal  Intervals:none  ST&T Change: Nonspecific  ____________________________________________  RADIOLOGY  ED MD interpretation: None  Official radiology report(s): No results found.  ____________________________________________   PROCEDURES  Procedure(s) performed (including Critical Care):  Procedures   ____________________________________________   INITIAL IMPRESSION / ASSESSMENT AND PLAN / ED COURSE  As part of my medical decision making, I reviewed the following data within the [place] notes reviewed and incorporated, Labs reviewed, EKG interpreted, A consult was requested and obtained from this/these consultant(s) Psychiatry and Notes from prior ED visits     Omar Davis was evaluated  in Emergency Department on 02/19/2019 for the symptoms described in the history of present illness. He was evaluated in the context of the global COVID-19 pandemic, which necessitated consideration that the patient might be at risk for infection with the SARS-CoV-2 virus that causes COVID-19. Institutional protocols and algorithms that pertain to the evaluation of patients at risk for COVID-19 are in a state of rapid change based on information released by regulatory bodies including the CDC and federal and state organizations. These policies and algorithms were followed during the patient's care in the ED.    68 year old male who presents with intoxication and suicidal ideation; frequent visits to the ED for same.  Will maintain IVC pending psychiatric evaluation and disposition.      ____________________________________________   FINAL CLINICAL IMPRESSION(S) / ED DIAGNOSES  Final diagnoses:  Alcoholic intoxication without complication [facility])     ED Discharge Orders    None       Note:  This document was prepared using Dragon voice recognition software and may include unintentional dictation errors.   Paulette Blanch, MD 02/19/19 [phone]

## 2019-02-19 NOTE — ED Notes (Signed)
Assumed care of patient, patient aox4, requesting someone call his hotel and request they keep his room for a few days. Number was goggled and phone provided to patient to call his place of residency and work out living arrangements. Patient presently denies pain/sob/ si/hv/si. Awaiting Md psych eval and plan of care. Safety maintained will continue to monitor.

## 2019-02-19 NOTE — ED Notes (Signed)
Patient has 3 additional bags with wet belongings with him to be sorted in quad area. Patient's bags will all be placed in one bag together in addition to bag of clothes that patient dressed out of (2 total bags).

## 2019-02-19 NOTE — ED Notes (Signed)
Hourly rounding reveals patient sleeping in room. No complaints, stable, in no acute distress. Q15 minute rounds and monitoring via Rover and Officer to continue.  

## 2019-02-20 MED ORDER — NAPROXEN 500 MG PO TABS
500.0000 mg | ORAL_TABLET | Freq: Once | ORAL | Status: AC
  Administered 2019-02-20: 09:00:00 500 mg via ORAL
  Filled 2019-02-20: qty 1

## 2019-02-20 NOTE — ED Notes (Signed)
Pt given meal tray.

## 2019-02-20 NOTE — ED Notes (Signed)
Hourly rounding reveals patient in room. No complaints, stable, in no acute distress. Q15 minute rounds and monitoring via Rover and Officer to continue.   

## 2019-02-20 NOTE — ED Notes (Signed)
Pt c/o psoriasis on his face and legs. MD notified. Pt given some moisturizer.

## 2019-02-20 NOTE — Consult Note (Signed)
[place] [facility] Psychiatry Consult   Reason for Consult: Psychiatric evaluation Referring Physician: Dr. Archie Balboa Patient Identification: Omar Davis MRN:  [id number] Principal Diagnosis: <principal problem not specified> Diagnosis:  Active Problems:   Depressed   Chronic pain   Alcohol abuse   PTSD (post-traumatic stress disorder)   Agitation   Suicidal ideation   Alcohol dependence with intoxication, uncomplicated ([place])   Major depressive disorder, recurrent severe without psychotic features ([place])   Alcohol-induced mood disorder ([place])   Essential hypertension   Sepsis ([place])   Total Time spent with patient: 30 minutes  Subjective: "No I do not want to hurt myself." Omar Davis is a 68 y.o. male patient presented to [facility] ED via law enforcement under involuntary commitment status (IVC). The patient was seen in the ED yesterday and discharge for similar behaviors and was under the influence of alcohol.  He was discharged at 12:28 PM and was brought back to the ED at 8:51 PM.  Per the ED triage nursing note, the initial patient assessment, he is voicing that he wants to die. The patient's ETOH level is 243 mg/dl.  Per the patient assessment by this provider, he denies suicidal ideation and self-injuries behavior.  The patient was seen face-to-face by this provider; chart reviewed and consulted with Dr. Archie Balboa on 02/19/2019 due to the patient's care. It was discussed with the EDP that the patient would be observed overnight and reassess in the a.m. by the TTS counselor to determine if he meets the criteria for psychiatric inpatient admission or be referred to outpatient detox.  The patient is alert and oriented x2, calm, cooperative, and mood-congruent with affect on evaluation. The patient does not appear to be responding to internal or external stimuli. Neither is the patient presenting with any delusional thinking. The patient denies auditory or visual hallucinations. The  patient denies any suicidal, homicidal, or self-harm ideations. The patient is not presenting with any psychotic or paranoid behaviors. During an encounter with the patient, he was able to answer questions appropriately.  Plan: The patient is not a safety risk to self or others. The patient will be observed overnight and reassess in the a.m. by TTS counselor to determine if he meets the criteria for psychiatric inpatient admission or be referred to outpatient detox.   HPI: Per Dr. Archie Balboa; Omar Davis is a 67 y.o. male who presents to the emergency department today under IVC because of concerns for suicidal ideation.  The patient does admit to drinking today.  He had been discharged from the emergency department earlier today for similar complaints.  At the time my exam the patient denies any SI.  He denies any recent illness.   Records reviewed. Per medical record review patient has a history of visit to emergency department recently with discharge yesterday   Past Psychiatric History:  Chronic pain Depressed PTSD (post-traumatic stress disorder) TBI (traumatic brain injury) ([place])   Risk to Self:  No Risk to Others:  No Prior Inpatient Therapy:  Yes Prior Outpatient Therapy:  Yes  Past Medical History:  Past Medical History:  Diagnosis Date  . Chronic pain   . Depressed   . Hypertension   . PTSD (post-traumatic stress disorder)   . TBI (traumatic brain injury) [facility])     Past Surgical History:  Procedure Laterality Date  . BACK SURGERY    . HERNIA REPAIR    . KNEE SURGERY    . KNEE SURGERY    . prostectomy N/A  Family History: No family history on file. Family Psychiatric  History: None provided Social History:  Social History   Substance and Sexual Activity  Alcohol Use Yes   Comment: 1 pint of vodka     Social History   Substance and Sexual Activity  Drug Use No    Social History   Socioeconomic History  . Marital status: Legally Separated     Spouse name: Not on file  . Number of children: Not on file  . Years of education: Not on file  . Highest education level: Not on file  Occupational History  . Not on file  Social Needs  . Financial resource strain: Not on file  . Food insecurity    Worry: Not on file    Inability: Not on file  . Transportation needs    Medical: Not on file    Non-medical: Not on file  Tobacco Use  . Smoking status: Never Smoker  . Smokeless tobacco: Never Used  Substance and Sexual Activity  . Alcohol use: Yes    Comment: 1 pint of vodka  . Drug use: No  . Sexual activity: Not Currently  Lifestyle  . Physical activity    Days per week: Not on file    Minutes per session: Not on file  . Stress: Not on file  Relationships  . Social Herbalist on phone: Not on file    Gets together: Not on file    Attends religious service: Not on file    Active member of club or organization: Not on file    Attends meetings of clubs or organizations: Not on file    Relationship status: Not on file  Other Topics Concern  . Not on file  Social History Narrative  . Not on file   Additional Social History:    Allergies:   Allergies  Allergen Reactions  . Codeine Hives and Rash  . Gabapentin Other (See Comments) and Anaphylaxis    seizures Other reaction(s): Other (See Comments) seizures Anaphylactic Shock   . Penicillins Rash    Other reaction(s): Other (See Comments) Other Reaction: Not Assessed Rash   . Lisinopril Swelling    Pt reports lip swelling    Labs:  Results for orders placed or performed during the hospital encounter of 02/19/19 (from the past 48 hour(s))  Comprehensive metabolic panel     Status: Abnormal   Collection Time: 02/19/19  9:06 PM  Result Value Ref Range   Sodium 142 135 - 145 mmol/L   Potassium 4.0 3.5 - 5.1 mmol/L   Chloride 109 98 - 111 mmol/L   CO2 24 22 - 32 mmol/L   Glucose, Bld 110 (H) 70 - 99 mg/dL   BUN 15 8 - 23 mg/dL   Creatinine, Ser  1.15 0.61 - 1.24 mg/dL   Calcium 9.1 8.9 - 10.3 mg/dL   Total Protein 6.8 6.5 - 8.1 g/dL   Albumin 3.8 3.5 - 5.0 g/dL   AST 26 15 - 41 U/L   ALT 20 0 - 44 U/L   Alkaline Phosphatase 84 38 - 126 U/L   Total Bilirubin 0.5 0.3 - 1.2 mg/dL   GFR calc non Af Amer >60 >60 mL/min   GFR calc Af Amer >60 >60 mL/min   Anion gap 9 5 - 15    Comment: Performed at [facility], [address], [place], [place] [address]  Ethanol     Status: Abnormal   Collection Time:  02/19/19  9:06 PM  Result Value Ref Range   Alcohol, Ethyl (B) 243 (H) <10 mg/dL    Comment: (NOTE) Lowest detectable limit for serum alcohol is 10 mg/dL. For medical purposes only. Performed at [facility], [address] [place], [place] [id number]   Salicylate level     Status: None   Collection Time: 02/19/19  9:06 PM  Result Value Ref Range   Salicylate Lvl [id number] 2.8 - 30.0 mg/dL    Comment: Performed at [facility], [address] [place], [place] [address]  Acetaminophen level     Status: Abnormal   Collection Time: 02/19/19  9:06 PM  Result Value Ref Range   Acetaminophen (Tylenol), Serum <10 (L) 10 - 30 ug/mL    Comment: (NOTE) Therapeutic concentrations vary significantly. A range of 10-30 ug/mL  may be an effective concentration for many patients. However, some  are best treated at concentrations outside of this range. Acetaminophen concentrations >150 ug/mL at 4 hours after ingestion  and >50 ug/mL at 12 hours after ingestion are often associated with  toxic reactions. Performed at [facility], [address] [place], [place] [address]   cbc     Status: Abnormal   Collection Time: 02/19/19  9:06 PM  Result Value Ref Range   WBC 9.0 4.0 - 10.5 K/uL   RBC 5.59 4.22 - 5.81 MIL/uL   Hemoglobin 11.4 (L) 13.0 - 17.0 g/dL   HCT 38.4 (L) 39.0 - 52.0 %   MCV 68.7 (L) 80.0 - 100.0 fL   MCH 20.4 (L) 26.0 - 34.0 pg   MCHC 29.7 (L) 30.0 - 36.0 g/dL   RDW 23.0 (H) 11.5 -  15.5 %   Platelets 362 150 - 400 K/uL   nRBC 0.0 0.0 - 0.2 %    Comment: Performed at [facility], [address] [place], [place] [address]  Urine Drug Screen, Qualitative     Status: None   Collection Time: 02/19/19  9:22 PM  Result Value Ref Range   Tricyclic, Ur Screen NONE DETECTED NONE DETECTED   Amphetamines, Ur Screen NONE DETECTED NONE DETECTED   MDMA (Ecstasy)Ur Screen NONE DETECTED NONE DETECTED   Cocaine Metabolite,Ur [place] NONE DETECTED NONE DETECTED   Opiate, Ur Screen NONE DETECTED NONE DETECTED   Phencyclidine (PCP) Ur S NONE DETECTED NONE DETECTED   Cannabinoid 50 Ng, Ur [place] NONE DETECTED NONE DETECTED   Barbiturates, Ur Screen NONE DETECTED NONE DETECTED   Benzodiazepine, Ur Scrn NONE DETECTED NONE DETECTED   Methadone Scn, Ur NONE DETECTED NONE DETECTED    Comment: (NOTE) Tricyclics + metabolites, urine    Cutoff 1000 ng/mL Amphetamines + metabolites, urine  Cutoff 1000 ng/mL MDMA (Ecstasy), urine              Cutoff 500 ng/mL Cocaine Metabolite, urine          Cutoff 300 ng/mL Opiate + metabolites, urine        Cutoff 300 ng/mL Phencyclidine (PCP), urine         Cutoff 25 ng/mL Cannabinoid, urine                 Cutoff 50 ng/mL Barbiturates + metabolites, urine  Cutoff 200 ng/mL Benzodiazepine, urine              Cutoff 200 ng/mL Methadone, urine                   Cutoff 300 ng/mL The urine  drug screen provides only a preliminary, unconfirmed analytical test result and should not be used for non-medical purposes. Clinical consideration and professional judgment should be applied to any positive drug screen result due to possible interfering substances. A more specific alternate chemical method must be used in order to obtain a confirmed analytical result. Gas chromatography / mass spectrometry (GC/MS) is the preferred confirmat ory method. Performed at [facility], [address] [place], [place] [address]     No current  facility-administered medications for this encounter.    Current Outpatient Medications  Medication Sig Dispense Refill  . divalproex (DEPAKOTE ER) 500 MG 24 hr tablet Take 2 tablets (1,000 mg total) by mouth 2 (two) times daily. 120 tablet 0  . DULoxetine (CYMBALTA) 30 MG capsule Take 1 capsule (30 mg total) by mouth daily. 30 capsule 0  . lisinopril (ZESTRIL) 10 MG tablet Take 1 tablet (10 mg total) by mouth daily. 30 tablet 0  . naproxen (NAPROSYN) 500 MG tablet Take 1 tablet (500 mg total) by mouth 2 (two) times daily as needed for moderate pain. 60 tablet 0  . pravastatin (PRAVACHOL) 80 MG tablet Take 1 tablet (80 mg total) by mouth at bedtime. 30 tablet 0  . thiamine 100 MG tablet Take 1 tablet (100 mg total) by mouth daily. 30 tablet 0    Musculoskeletal: Strength & Muscle Tone: within normal limits Gait & Station: normal Patient leans: N/A  Psychiatric Specialty Exam: Physical Exam  Nursing note and vitals reviewed. Constitutional: He appears well-developed.  Neck: Normal range of motion. Neck supple.  Respiratory: Effort normal.  Musculoskeletal: Normal range of motion.    Review of Systems  Psychiatric/Behavioral: Positive for substance abuse. The patient is nervous/anxious.     Blood pressure 111/84, pulse 90, temperature 98.6 F (37 C), temperature source Oral, resp. rate 20, height 5\' 6"  (1.676 m), weight 79.1 kg, SpO2 95 %.Body mass index is 28.15 kg/m.  General Appearance: Bizarre and Disheveled  Eye Contact:  Minimal  Speech:  Garbled  Volume:  Decreased  Mood:  Anxious and Depressed  Affect:  Congruent, Depressed and Flat  Thought Process:  Coherent  Orientation:  Full (Time, Place, and Person)  Thought Content:  Logical  Suicidal Thoughts:  No  Homicidal Thoughts:  No  Memory:  Immediate;   Fair Recent;   Fair Remote;   Fair  Judgement:  Poor  Insight:  Lacking  Psychomotor Activity:  Decreased  Concentration:  Concentration: Poor and Attention Span:  Poor  Recall:  Poor  Fund of Knowledge:  Poor  Language:  Fair  Akathisia:  Negative  Handed:  Right  AIMS (if indicated):     Assets:  Desire for Improvement Physical Health Social Support  ADL's:  Intact  Cognition:  Impaired,  Mild  Sleep:   Good     Treatment Plan Summary: Daily contact with patient to assess and evaluate symptoms and progress in treatment, Medication management and Plan Patient will be observed overnight and reassess in the a.m. by TTS counselor inorder to determine if he meets criteria for detox placement  Disposition: No evidence of imminent risk to self or others at present.   Patient does not meet criteria for psychiatric inpatient admission. Supportive therapy provided about ongoing stressors.  Caroline Sauger, NP 02/20/2019 12:01 AM

## 2019-02-20 NOTE — ED Notes (Signed)
Pt's clothes and belongings returned to patient.

## 2019-02-20 NOTE — ED Notes (Signed)
Omar Davis taxi called to take pt back to hotel.

## 2019-02-20 NOTE — ED Provider Notes (Addendum)
-----------------------------------------   8:10 AM on 02/20/2019 -----------------------------------------   Blood pressure 111/84, pulse 90, temperature 98.6 F (37 C), temperature source Oral, resp. rate 20, height 5\' 6"  (1.676 m), weight 79.1 kg, SpO2 95 %.  The patient is calm and cooperative at this time.  There have been no acute events since the last update.  Awaiting disposition plan from Behavioral Medicine and/or Social Work team(s).   Carrie Mew, MD 02/20/19 [phone]   ----------------------------------------- 12:44 PM on 02/20/2019 -----------------------------------------  Patient is awake, clinically sober, denies any intent to harm himself.  He was cleared by psychiatry last night.  I will reverse the IVC and discharge home.   Carrie Mew, MD 02/20/19 [phone]

## 2019-02-25 ENCOUNTER — Emergency Department
Admission: EM | Admit: 2019-02-25 | Discharge: 2019-02-25 | Disposition: A | Discharge status: Home / Self Care | Payer: No Typology Code available for payment source

## 2019-02-25 NOTE — ED Notes (Signed)
When RN started to ask pt questions pt reported to this RN that he did not want to be in the ED. Pt questioned as to why he came to the ED with the police if he did not want to be here and and pt reported, "I don't know." Pt cussing but calm and stating he does not want to be in the hospital because hospitals can not help him. Pt reporting, "I am a Vet and I am just a fucked up old man that wants to be back at my motel" Pt started to make sexual comments to this RN about, " I would love to have sex with you" and "If I was younger you and me would have one hell of a time." Per BPD and [employer] pt living at [facility] in [place] and pt confirmed he can pay for a cab home. [place] Mow called by this RN and pt escorted outside. Pt touching RNs arms and attempting to hold RNs hands but remained calm and is sitting outside of ER waiting for Taxi to take patient home.

## 2019-02-28 ENCOUNTER — Other Ambulatory Visit: Payer: Self-pay

## 2019-02-28 ENCOUNTER — Emergency Department
Admission: EM | Admit: 2019-02-28 | Discharge: 2019-02-28 | Disposition: A | Discharge status: Home / Self Care | Payer: No Typology Code available for payment source | Attending: Emergency Medicine | Admitting: Emergency Medicine

## 2019-02-28 ENCOUNTER — Encounter: Payer: Self-pay | Admitting: Emergency Medicine

## 2019-02-28 ENCOUNTER — Other Ambulatory Visit: Payer: Self-pay | Admitting: Acute Care

## 2019-02-28 DIAGNOSIS — F1010 Alcohol abuse, uncomplicated: Secondary | ICD-10-CM | POA: Diagnosis not present

## 2019-02-28 DIAGNOSIS — Y908 Blood alcohol level of 240 mg/100 ml or more: Secondary | ICD-10-CM | POA: Diagnosis not present

## 2019-02-28 DIAGNOSIS — R200 Anesthesia of skin: Secondary | ICD-10-CM

## 2019-02-28 DIAGNOSIS — R456 Violent behavior: Secondary | ICD-10-CM | POA: Insufficient documentation

## 2019-02-28 DIAGNOSIS — I10 Essential (primary) hypertension: Secondary | ICD-10-CM | POA: Diagnosis not present

## 2019-02-28 DIAGNOSIS — R451 Restlessness and agitation: Secondary | ICD-10-CM | POA: Diagnosis not present

## 2019-02-28 DIAGNOSIS — Z046 Encounter for general psychiatric examination, requested by authority: Secondary | ICD-10-CM | POA: Diagnosis not present

## 2019-02-28 DIAGNOSIS — R4182 Altered mental status, unspecified: Secondary | ICD-10-CM | POA: Diagnosis present

## 2019-02-28 DIAGNOSIS — Z79899 Other long term (current) drug therapy: Secondary | ICD-10-CM | POA: Insufficient documentation

## 2019-02-28 DIAGNOSIS — R45851 Suicidal ideations: Secondary | ICD-10-CM | POA: Diagnosis not present

## 2019-02-28 DIAGNOSIS — R202 Paresthesia of skin: Secondary | ICD-10-CM

## 2019-02-28 LAB — CBC
HCT: 42.3 % (ref 39.0–52.0)
Hemoglobin: 12.3 g/dL — ABNORMAL LOW (ref 13.0–17.0)
MCH: 20 pg — ABNORMAL LOW (ref 26.0–34.0)
MCHC: 29.1 g/dL — ABNORMAL LOW (ref 30.0–36.0)
MCV: 68.8 fL — ABNORMAL LOW (ref 80.0–100.0)
Platelets: 328 10*3/uL (ref 150–400)
RBC: 6.15 MIL/uL — ABNORMAL HIGH (ref 4.22–5.81)
RDW: 22.5 % — ABNORMAL HIGH (ref 11.5–15.5)
WBC: 8.8 10*3/uL (ref 4.0–10.5)
nRBC: 0 % (ref 0.0–0.2)

## 2019-02-28 LAB — URINALYSIS, COMPLETE (UACMP) WITH MICROSCOPIC
Bacteria, UA: NONE SEEN
Bilirubin Urine: NEGATIVE
Glucose, UA: NEGATIVE mg/dL
Hgb urine dipstick: NEGATIVE
Ketones, ur: NEGATIVE mg/dL
Leukocytes,Ua: NEGATIVE
Nitrite: NEGATIVE
Protein, ur: NEGATIVE mg/dL
Specific Gravity, Urine: 1.008 (ref 1.005–1.030)
Squamous Epithelial / HPF: NONE SEEN (ref 0–5)
pH: 6 (ref 5.0–8.0)

## 2019-02-28 LAB — URINE DRUG SCREEN, QUALITATIVE (ARMC ONLY)
Amphetamines, Ur Screen: NOT DETECTED
Barbiturates, Ur Screen: NOT DETECTED
Benzodiazepine, Ur Scrn: NOT DETECTED
Cannabinoid 50 Ng, Ur ~~LOC~~: NOT DETECTED
Cocaine Metabolite,Ur ~~LOC~~: NOT DETECTED
MDMA (Ecstasy)Ur Screen: NOT DETECTED
Methadone Scn, Ur: NOT DETECTED
Opiate, Ur Screen: NOT DETECTED
Phencyclidine (PCP) Ur S: NOT DETECTED
Tricyclic, Ur Screen: NOT DETECTED

## 2019-02-28 LAB — COMPREHENSIVE METABOLIC PANEL
ALT: 14 U/L (ref 0–44)
AST: 22 U/L (ref 15–41)
Albumin: 4.1 g/dL (ref 3.5–5.0)
Alkaline Phosphatase: 91 U/L (ref 38–126)
Anion gap: 11 (ref 5–15)
BUN: 23 mg/dL (ref 8–23)
CO2: 20 mmol/L — ABNORMAL LOW (ref 22–32)
Calcium: 9.2 mg/dL (ref 8.9–10.3)
Chloride: 109 mmol/L (ref 98–111)
Creatinine, Ser: 1.18 mg/dL (ref 0.61–1.24)
GFR calc Af Amer: >60 mL/min (ref >60)
GFR calc non Af Amer: >60 mL/min (ref >60)
Glucose, Bld: 127 mg/dL — ABNORMAL HIGH (ref 70–99)
Potassium: 3.6 mmol/L (ref 3.5–5.1)
Sodium: 140 mmol/L (ref 135–145)
Total Bilirubin: 0.5 mg/dL (ref 0.3–1.2)
Total Protein: 7.4 g/dL (ref 6.5–8.1)

## 2019-02-28 LAB — SALICYLATE LEVEL: Salicylate Lvl: <7 mg/dL (ref 2.8–30.0)

## 2019-02-28 LAB — ETHANOL: Alcohol, Ethyl (B): 305 mg/dL (ref <10)

## 2019-02-28 LAB — ACETAMINOPHEN LEVEL: Acetaminophen (Tylenol), Serum: <10 ug/mL — ABNORMAL LOW (ref 10–30)

## 2019-02-28 MED ORDER — HALOPERIDOL LACTATE 5 MG/ML IJ SOLN
10.0000 mg | Freq: Once | INTRAMUSCULAR | Status: AC
  Administered 2019-02-28: 10 mg via INTRAMUSCULAR
  Filled 2019-02-28: qty 2

## 2019-02-28 NOTE — ED Notes (Signed)
Pt sleeping at this time.

## 2019-02-28 NOTE — ED Notes (Signed)
Pt denture fell out onto floor, one tooth lost and broken. PT refused for staff to put up dentures. Tooth placed into urine cup and placed into belongings bag

## 2019-02-28 NOTE — ED Notes (Signed)
tts and psych at bedside

## 2019-02-28 NOTE — ED Notes (Signed)
Pt yelling and cursing again at this time, refusing to stay in bed. NT and security at bedside for pt safety

## 2019-02-28 NOTE — ED Provider Notes (Signed)
[facility] Emergency Department Provider Note  ____________________________________________   First MD Initiated Contact with Patient 02/28/19 1400     (approximate)  I have reviewed the triage vital signs and the nursing notes.  History  Chief Complaint Suicidal    HPI Omar Davis is a 68 y.o. male with a history of alcohol abuse, alcohol induced mood disorder, PTSD, depression, who presents to the emergency department for suicidal ideation and alcohol intoxication.  Per report, patient called 911 complaining of suicidal ideation.  He was combative and intoxicated with officers.  On arrival he is screaming, cursing, and agitated.  He is stating that he is suicidal.  Remainder of history limited due to patient's intoxication.   Past Medical Hx Past Medical History:  Diagnosis Date  . Chronic pain   . Depressed   . Hypertension   . PTSD (post-traumatic stress disorder)   . TBI (traumatic brain injury) [facility])     Problem List Patient Active Problem List   Diagnosis Date Noted  . Sepsis ([place]) 02/08/2019  . Essential hypertension 02/05/2019  . Alcohol-induced mood disorder ([place]) 01/24/2019  . Alcohol dependence with intoxication, uncomplicated ([place]) [id number]  . Major depressive disorder, recurrent severe without psychotic features ([place]) 08/24/2018  . Suicidal ideation 09/25/2015  . Alcohol abuse 09/09/2015  . PTSD (post-traumatic stress disorder) 09/09/2015  . Agitation 09/09/2015  . Depressed   . Chronic pain     Past Surgical Hx Past Surgical History:  Procedure Laterality Date  . BACK SURGERY    . HERNIA REPAIR    . KNEE SURGERY    . KNEE SURGERY    . prostectomy N/A     Medications Prior to Admission medications   Medication Sig Start Date End Date Taking? Authorizing Provider  divalproex (DEPAKOTE ER) 500 MG 24 hr tablet Take 2 tablets (1,000 mg total) by mouth 2 (two) times daily. 02/07/19   Clapacs, Madie Reno, MD   DULoxetine (CYMBALTA) 30 MG capsule Take 1 capsule (30 mg total) by mouth daily. 02/07/19   Clapacs, Madie Reno, MD  lisinopril (ZESTRIL) 10 MG tablet Take 1 tablet (10 mg total) by mouth daily. 02/07/19   Clapacs, Madie Reno, MD  naproxen (NAPROSYN) 500 MG tablet Take 1 tablet (500 mg total) by mouth 2 (two) times daily as needed for moderate pain. 02/07/19   Clapacs, Madie Reno, MD  pravastatin (PRAVACHOL) 80 MG tablet Take 1 tablet (80 mg total) by mouth at bedtime. 02/07/19   Clapacs, Madie Reno, MD  thiamine 100 MG tablet Take 1 tablet (100 mg total) by mouth daily. 02/07/19   Clapacs, Madie Reno, MD    Allergies Codeine, Gabapentin, Penicillins, and Lisinopril  Family Hx No family history on file.  Social Hx Social History   Tobacco Use  . Smoking status: Never Smoker  . Smokeless tobacco: Never Used  Substance Use Topics  . Alcohol use: Yes    Comment: 1 pint of vodka  . Drug use: No     Review of Systems Unable to obtain due to patient's intoxication.   Physical Exam  Vital Signs: ED Triage Vitals  Enc Vitals Group     BP --      Pulse Rate 02/28/19 1417 (!) 102     Resp 02/28/19 1417 20     Temp 02/28/19 1417 98.1 F (36.7 C)     Temp Source 02/28/19 1417 Oral     SpO2 02/28/19 1417 100 %     Weight --  Height --      Head Circumference --      Peak Flow --      Pain Score 02/28/19 1355 0     Pain Loc --      Pain Edu? --      Excl. in [place]? --     Constitutional: Screaming, cursing, combative.  Appears intoxicated.  No evidence of trauma. Head: Normocephalic. Atraumatic. Eyes: Conjunctivae clear. Sclera anicteric. Neck: No stridor.   Cardiovascular: HR low 100s. Respiratory: Normal respiratory effort.  Gastrointestinal: Non-distended.  Musculoskeletal: No deformities. Neurologic: Appears intoxicated. Skin: Skin is warm, dry and intact.  Psychiatric: Intoxicated.  Reports SI.  EKG  N/A    Radiology  N/A   Procedures  Procedure(s) performed (including  critical care):  Procedures   Initial Impression / Assessment and Plan / ED Course  68 y.o. male who presents to the ED for suicidal ideation and intoxication as above.  Patient has a history of alcohol abuse, depression, as well as alcohol induced mood disorder.  Suspect his presentation today is consistent with his known history.  He does appear intoxicated on arrival.  Will obtain basic screening labs, including alcohol level, UDS.  We will plan for psychiatry/TTS evaluation when sober.  Given his intoxication and statements of suicidal ideation, will place under IVC.   Final Clinical Impression(s) / ED Diagnosis  Final diagnoses:  Suicidal ideation  Alcohol abuse  Involuntary commitment       Note:  This document was prepared using Dragon voice recognition software and may include unintentional dictation errors.   Lilia Pro., MD 02/28/19 1539

## 2019-02-28 NOTE — ED Provider Notes (Signed)
Patient was given Haldol for extreme agitation   Earleen Newport, MD 02/28/19 1620

## 2019-02-28 NOTE — Consult Note (Signed)
[place] Psychiatry Consult   Reason for Consult: Substance use and SI Referring Physician: Lenise Arena Patient Identification: Omar Davis MRN:  [id number] Principal Diagnosis: <principal problem not specified> Diagnosis:  Active Problems:   * No active hospital problems. *   Total Time spent with patient: 30 minutes  Subjective:   Omar Davis is a 68 y.o. male patient presented brought in by police due to alcoholism and SI. HPI: Patient is a 68 year old male well-known to [facility] for his multiple visits typically while he is under the influence of extreme amounts of alcohol.  Patient presented agitated and aggressive with staff, requiring the use of Haldol for extreme agitation.  Patient was then sleeping for some time.  Patient awoke and was much more cooperative.  Patient states that he was drinking today approximately half pint of vodka.  Patient states that he was drinking for no other reason other than he is an alcoholic.  Patient states that he then called 911 and told him that he wanted to stop drinking and he was suicidal.  Now and when then transported him to the ED.  Patient is unsure why he said that to the 911 as he states that he is not suicidal.  Patient has maintained that he has never been suicidal and rather just says that in the moment due to feelings of loneliness.  Patient denies feeling lonely now.  Patient denies any suicidal ideation at the current moment.  Patient denies any homicidal ideation.  When patient is asked to make sense of his bizarre presentations and his abrupt changes in mood patient offers the following excuse "I am a [place] veteran and I have a lot of bad dreams.  Those dreams make me drink and sometimes when I drink I say stuff that I do not mean".  Patient was offered assistance with his symptoms of PTSD.  Patient denied stating instead that medication does not work for him and he is not interested in taking any medication at this  time.  Patient was also offered rehab services to which she refused stating that he would obtain his own rehab.  Patient states that he plans to drink less and as a way to cut himself off eventually.  Patient was educated as to the potential risks of self discontinuing himself from alcohol.  Patient acknowledged these risks and still requested to be discharged.   Past Psychiatric History: Numerous ED visits.  Patient has been psychiatrically hospitalized in the past.  Does not take medication is does not follow-up with care.  Risk to Self:  No Risk to Others:  No Prior Inpatient Therapy:  Yes Prior Outpatient Therapy:  Yes  Past Medical History:  Past Medical History:  Diagnosis Date  . Chronic pain   . Depressed   . Hypertension   . PTSD (post-traumatic stress disorder)   . TBI (traumatic brain injury) [facility])     Past Surgical History:  Procedure Laterality Date  . BACK SURGERY    . HERNIA REPAIR    . KNEE SURGERY    . KNEE SURGERY    . prostectomy N/A    Family History: No family history on file. Family Psychiatric  History: Denies Social History:  Social History   Substance and Sexual Activity  Alcohol Use Yes   Comment: 1 pint of vodka     Social History   Substance and Sexual Activity  Drug Use No    Social History   Socioeconomic History  .  Marital status: Legally Separated    Spouse name: Not on file  . Number of children: Not on file  . Years of education: Not on file  . Highest education level: Not on file  Occupational History  . Not on file  Social Needs  . Financial resource strain: Not on file  . Food insecurity    Worry: Not on file    Inability: Not on file  . Transportation needs    Medical: Not on file    Non-medical: Not on file  Tobacco Use  . Smoking status: Never Smoker  . Smokeless tobacco: Never Used  Substance and Sexual Activity  . Alcohol use: Yes    Comment: 1 pint of vodka  . Drug use: No  . Sexual activity: Not Currently   Lifestyle  . Physical activity    Days per week: Not on file    Minutes per session: Not on file  . Stress: Not on file  Relationships  . Social Herbalist on phone: Not on file    Gets together: Not on file    Attends religious service: Not on file    Active member of club or organization: Not on file    Attends meetings of clubs or organizations: Not on file    Relationship status: Not on file  Other Topics Concern  . Not on file  Social History Narrative  . Not on file   Additional Social History: Patient lives alone by himself in a motel has a girlfriend but has not spoken to her in some time due to interpersonal difficulties.    Allergies:   Allergies  Allergen Reactions  . Codeine Hives and Rash  . Gabapentin Other (See Comments) and Anaphylaxis    seizures Other reaction(s): Other (See Comments) seizures Anaphylactic Shock   . Penicillins Rash    Other reaction(s): Other (See Comments) Other Reaction: Not Assessed Rash   . Lisinopril Swelling    Pt reports lip swelling    Labs:  Results for orders placed or performed during the hospital encounter of 02/28/19 (from the past 48 hour(s))  Acetaminophen level     Status: Abnormal   Collection Time: 02/28/19  2:03 PM  Result Value Ref Range   Acetaminophen (Tylenol), Serum <10 (L) 10 - 30 ug/mL    Comment: (NOTE) Therapeutic concentrations vary significantly. A range of 10-30 ug/mL  may be an effective concentration for many patients. However, some  are best treated at concentrations outside of this range. Acetaminophen concentrations >150 ug/mL at 4 hours after ingestion  and >50 ug/mL at 12 hours after ingestion are often associated with  toxic reactions. Performed at [facility], [address] [place], [place] [address]   CBC     Status: Abnormal   Collection Time: 02/28/19  2:03 PM  Result Value Ref Range   WBC 8.8 4.0 - 10.5 K/uL   RBC 6.15 (H) 4.22 - 5.81 MIL/uL    Hemoglobin 12.3 (L) 13.0 - 17.0 g/dL   HCT 42.3 39.0 - 52.0 %   MCV 68.8 (L) 80.0 - 100.0 fL   MCH 20.0 (L) 26.0 - 34.0 pg   MCHC 29.1 (L) 30.0 - 36.0 g/dL   RDW 22.5 (H) 11.5 - 15.5 %   Platelets 328 150 - 400 K/uL   nRBC 0.0 0.0 - 0.2 %    Comment: Performed at [facility], [address], [place], [place] [address]  Comprehensive metabolic panel  Status: Abnormal   Collection Time: 02/28/19  2:03 PM  Result Value Ref Range   Sodium 140 135 - 145 mmol/L   Potassium 3.6 3.5 - 5.1 mmol/L   Chloride 109 98 - 111 mmol/L   CO2 20 (L) 22 - 32 mmol/L   Glucose, Bld 127 (H) 70 - 99 mg/dL   BUN 23 8 - 23 mg/dL   Creatinine, Ser 1.18 0.61 - 1.24 mg/dL   Calcium 9.2 8.9 - 10.3 mg/dL   Total Protein 7.4 6.5 - 8.1 g/dL   Albumin 4.1 3.5 - 5.0 g/dL   AST 22 15 - 41 U/L   ALT 14 0 - 44 U/L   Alkaline Phosphatase 91 38 - 126 U/L   Total Bilirubin 0.5 0.3 - 1.2 mg/dL   GFR calc non Af Amer >60 >60 mL/min   GFR calc Af Amer >60 >60 mL/min   Anion gap 11 5 - 15    Comment: Performed at [facility], [address] [place], [place] [address]  Ethanol     Status: Abnormal   Collection Time: 02/28/19  2:03 PM  Result Value Ref Range   Alcohol, Ethyl (B) 305 (HH) <10 mg/dL    Comment: CRITICAL RESULT CALLED TO, READ BACK BY AND VERIFIED WITH [place] MOORE 1444 02/28/2019 KMP/DAS (NOTE) Lowest detectable limit for serum alcohol is 10 mg/dL. For medical purposes only. Performed at [facility], [address] [place], [place] [id number]   Salicylate level     Status: None   Collection Time: 02/28/19  2:03 PM  Result Value Ref Range   Salicylate Lvl [id number] 2.8 - 30.0 mg/dL    Comment: Performed at [facility], [address] [place], [place] [address]    No current facility-administered medications for this encounter.    Current Outpatient Medications  Medication Sig Dispense Refill  . divalproex (DEPAKOTE ER) 500 MG 24 hr tablet Take 2 tablets  (1,000 mg total) by mouth 2 (two) times daily. 120 tablet 0  . DULoxetine (CYMBALTA) 30 MG capsule Take 1 capsule (30 mg total) by mouth daily. 30 capsule 0  . lisinopril (ZESTRIL) 10 MG tablet Take 1 tablet (10 mg total) by mouth daily. 30 tablet 0  . naproxen (NAPROSYN) 500 MG tablet Take 1 tablet (500 mg total) by mouth 2 (two) times daily as needed for moderate pain. 60 tablet 0  . pravastatin (PRAVACHOL) 80 MG tablet Take 1 tablet (80 mg total) by mouth at bedtime. 30 tablet 0  . thiamine 100 MG tablet Take 1 tablet (100 mg total) by mouth daily. 30 tablet 0    Musculoskeletal: Strength & Muscle Tone: within normal limits Gait & Station: normal Patient leans: N/A  Psychiatric Specialty Exam: Physical Exam  Review of Systems  Constitutional: Negative for chills.  HENT: Positive for ear pain.   Respiratory: Negative for cough.   Cardiovascular: Negative for chest pain.  Psychiatric/Behavioral: Positive for substance abuse. Negative for depression, hallucinations and suicidal ideas. The patient is not nervous/anxious.     Pulse (!) 102, temperature 98.1 F (36.7 C), temperature source Oral, resp. rate 20, SpO2 100 %.There is no height or weight on file to calculate BMI.  General Appearance: Disheveled  Eye Contact:  Good  Speech:  Garbled and Normal Rate  Volume:  Normal  Mood:  Angry and Euthymic  Affect:  Full Range  Thought Process:  Coherent  Orientation:  Full (Time, Place, and Person)  Thought Content:  Logical  Suicidal Thoughts:  No  Homicidal Thoughts:  No  Memory:  Immediate;   Fair  Judgement:  Fair  Insight:  Shallow  Psychomotor Activity:  Mannerisms and Restlessness  Concentration:  Concentration: Fair  Recall:  [employer] of Knowledge:  Fair  Language:  Fair  Akathisia:  No  Handed:  Right  AIMS (if indicated):     Assets:  Communication Skills Resilience  ADL's:  Intact  Cognition:  WNL  Sleep:        Treatment Plan Summary:  Assessment:  68 year old male patient with history of alcoholism.  Patient chronically presents in a similar fashion where he will get intoxicated and make suicidal claims to police and then immediately deny those claims or recent those claims upon evaluation.  This is a second time patient has been seen by Probation officer.  Writer is attempted to encourage patient to limit his drinking and to get the necessary services including potential psych services, if not inpatient at  least on an outpatient basis, in order to better control his mood so that he would not have to drink as much.  Patient does state that he will go forward with these ideas.   Disposition: Patient does not meet criteria for psychiatric inpatient admission. Discussed crisis plan, support from social network, calling 911, coming to the Emergency Department, and calling Suicide Hotline.  [place]ie Dials, MD 02/28/2019 4:32 PM

## 2019-02-28 NOTE — BH Assessment (Signed)
Assessment Note  Omar Davis is an 68 y.o. male who presents to the ER due to calling law enforcement and voicing SI. Patient admits to drinking alcohol and he started thinking about his girlfriend.  He wanted to call her but she no longer answers his calls. Therefore, he called 911.  Patient is well known to the ER for similar presentation. When sober, patient is able to discharge without any problems. Upon arrival to the ER BAC was 305. He was agitated and irritable.  During the interview, the patient was able to provide appropriate answers to the questions. Throughout the interview, patient denies SI/IHI and AV/H. He further reports he drinks because he was in [place] and have no desire to stop drinking. "I don't won't rehab. I'll stop when I get ready..."  Diagnosis: Alcohol Use Disorder  Past Medical History:  Past Medical History:  Diagnosis Date  . Chronic pain   . Depressed   . Hypertension   . PTSD (post-traumatic stress disorder)   . TBI (traumatic brain injury) [facility])     Past Surgical History:  Procedure Laterality Date  . BACK SURGERY    . HERNIA REPAIR    . KNEE SURGERY    . KNEE SURGERY    . prostectomy N/A     Family History: No family history on file.  Social History:  reports that he has never smoked. He has never used smokeless tobacco. He reports current alcohol use. He reports that he does not use drugs.  Additional Social History:  Alcohol / Drug Use Pain Medications: See PTA Prescriptions: See PTA Over the Counter: See PTA History of alcohol / drug use?: Yes Substance #1 Name of Substance 1: Alcohol 1 - Age of First Use: Unable to quantify 1 - Amount (size/oz): "Half of pint" 1 - Frequency: Binge Drink 1 - Duration: Unable to quantify 1 - Last Use / Amount: 02/28/2019  CIWA: CIWA-Ar BP: (pt will not hold still, will try again ) Pulse Rate: (!) 102 COWS:    Allergies:  Allergies  Allergen Reactions  . Codeine Hives and Rash  .  Gabapentin Other (See Comments) and Anaphylaxis    seizures Other reaction(s): Other (See Comments) seizures Anaphylactic Shock   . Penicillins Rash    Other reaction(s): Other (See Comments) Other Reaction: Not Assessed Rash   . Lisinopril Swelling    Pt reports lip swelling    Home Medications: (Not in a hospital admission)   OB/GYN Status:  No LMP for male patient.  General Assessment Data Location of Assessment: [facility] ED TTS Assessment: In system Is this a Tele or Face-to-Face Assessment?: Face-to-Face Is this an Initial Assessment or a Re-assessment for this encounter?: Initial Assessment Language Other than English: No Living Arrangements: Other (Comment)(Hotel) What gender do you identify as?: Male Marital status: Single Pregnancy Status: No Living Arrangements: Alone Can pt return to current living arrangement?: Yes Admission Status: Voluntary Petitioner: Police Is patient capable of signing voluntary admission?: Yes Referral Source: Self/Family/Friend Insurance type: VA Benefits  Medical Screening Exam ([place]) Medical Exam completed: Yes  Crisis Care Plan Living Arrangements: Alone Name of Psychiatrist: [facility] [place] Name of Therapist: none  Education Status Is patient currently in school?: No Highest grade of school patient has completed: Pt reports "year and 1/2 of tech school". Is the patient employed, unemployed or receiving disability?: Receiving disability income  Risk to self with the past 6 months Suicidal Ideation: No Has patient been a risk  to self within the past 6 months prior to admission? : No Suicidal Intent: No Has patient had any suicidal intent within the past 6 months prior to admission? : No Is patient at risk for suicide?: No Suicidal Plan?: No Has patient had any suicidal plan within the past 6 months prior to admission? : No Access to Means: No What has been your use of drugs/alcohol within the last 12 months?:  Alcohol Previous Attempts/Gestures: No How many times?: 0 Other Self Harm Risks: Ongoing alcohol abuse Triggers for Past Attempts: None known Intentional Self Injurious Behavior: None Family Suicide History: No Recent stressful life event(s): Other (Comment)(Alcohol Use) Persecutory voices/beliefs?: No Depression: Yes Depression Symptoms: Isolating, Tearfulness, Feeling angry/irritable, Loss of interest in usual pleasures Substance abuse history and/or treatment for substance abuse?: Yes Suicide prevention information given to non-admitted patients: Not applicable  Risk to Others within the past 6 months Homicidal Ideation: No Does patient have any lifetime risk of violence toward others beyond the six months prior to admission? : No Thoughts of Harm to Others: No Current Homicidal Intent: No Current Homicidal Plan: No Access to Homicidal Means: No Identified Victim: Reports of none History of harm to others?: No Assessment of Violence: None Noted Violent Behavior Description: Reports of none Does patient have access to weapons?: No Criminal Charges Pending?: No Does patient have a court date: No Is patient on probation?: No  Psychosis Hallucinations: None noted Delusions: None noted  Mental Status Report Appearance/Hygiene: Unremarkable, In scrubs Eye Contact: Poor Motor Activity: Freedom of movement, Unremarkable Speech: Logical/coherent, Unremarkable Level of Consciousness: Alert Mood: Depressed, Anxious, Helpless, Pleasant Affect: Appropriate to circumstance, Sad Anxiety Level: Minimal Thought Processes: Coherent, Relevant Judgement: Partial Orientation: Person, Place, Time, Situation, Appropriate for developmental age Obsessive Compulsive Thoughts/Behaviors: Minimal  Cognitive Functioning Concentration: Decreased Memory: Recent Intact, Remote Intact Is patient IDD: No Insight: Fair Impulse Control: Fair Appetite: Fair Have you had any weight changes? : No  Change Sleep: No Change Total Hours of Sleep: 7 Vegetative Symptoms: None  ADLScreening [facility] Assessment Services) Patient's cognitive ability adequate to safely complete daily activities?: Yes Patient able to express need for assistance with ADLs?: Yes Independently performs ADLs?: Yes (appropriate for developmental age)  Prior Inpatient Therapy Prior Inpatient Therapy: Yes Prior Therapy Dates: 2020 Prior Therapy Facilty/Provider(s): [facility]  Reason for Treatment: depression   Prior Outpatient Therapy Prior Outpatient Therapy: Yes Prior Therapy Dates: Current  Prior Therapy Facilty/Provider(s): [facility] Reason for Treatment: Depression  Does patient have an ACCT team?: No Does patient have Intensive In-House Services?  : No Does patient have [facility] services? : No Does patient have P4CC services?: No  ADL Screening (condition at time of admission) Patient's cognitive ability adequate to safely complete daily activities?: Yes Is the patient deaf or have difficulty hearing?: No Does the patient have difficulty seeing, even when wearing glasses/contacts?: No Does the patient have difficulty concentrating, remembering, or making decisions?: No Patient able to express need for assistance with ADLs?: Yes Does the patient have difficulty dressing or bathing?: No Independently performs ADLs?: Yes (appropriate for developmental age) Does the patient have difficulty walking or climbing stairs?: No Weakness of Legs: None Weakness of Arms/Hands: None  Home Assistive Devices/Equipment Home Assistive Devices/Equipment: None  Therapy Consults (therapy consults require a physician order) PT Evaluation Needed: No OT Evalulation Needed: No SLP Evaluation Needed: No Abuse/Neglect Assessment (Assessment to be complete while patient is alone) Abuse/Neglect Assessment Can Be Completed: Yes Physical Abuse: Denies Verbal Abuse: Denies  Sexual Abuse: Denies Exploitation of  patient/patient's resources: Denies Self-Neglect: Denies Values / Beliefs Cultural Requests During Hospitalization: None Spiritual Requests During Hospitalization: None Consults Spiritual Care Consult Needed: No Social Work Consult Needed: No Regulatory affairs officer (For Healthcare) Does Patient Have a Medical Advance Directive?: No       Child/Adolescent Assessment Running Away Risk: Denies(Patient is an adult)  Disposition:  Disposition Initial Assessment Completed for this Encounter: Yes  On Site Evaluation by:   Reviewed with Physician:    Gunnar Fusi MS, LCAS, [facility], [place] Therapeutic Triage Specialist 02/28/2019 6:24 PM

## 2019-02-28 NOTE — ED Notes (Signed)
MD Williams at bedside.  

## 2019-02-28 NOTE — ED Notes (Signed)
Pt has upper and lower set of dentures, they are in his mouth at this time.

## 2019-02-28 NOTE — ED Notes (Signed)
Pt belongings consist of : shirt, socks, tennis shoes, jeans, belt, and eye glasses. Pt pants have full pockets.

## 2019-02-28 NOTE — ED Triage Notes (Signed)
Pt via [employer] from motel, called 911 c/o SI. Pt was combative and intoxicated on scene per officers. PT comes in hand cuffs. [employer] at bedside, PT is screaming and cursing at bedside

## 2019-03-02 ENCOUNTER — Emergency Department
Admission: EM | Admit: 2019-03-02 | Discharge: 2019-03-03 | Disposition: A | Discharge status: Home / Self Care | Payer: No Typology Code available for payment source | Attending: Emergency Medicine | Admitting: Emergency Medicine

## 2019-03-02 ENCOUNTER — Other Ambulatory Visit: Payer: Self-pay

## 2019-03-02 DIAGNOSIS — Z046 Encounter for general psychiatric examination, requested by authority: Secondary | ICD-10-CM | POA: Insufficient documentation

## 2019-03-02 DIAGNOSIS — Z87820 Personal history of traumatic brain injury: Secondary | ICD-10-CM | POA: Insufficient documentation

## 2019-03-02 DIAGNOSIS — F10929 Alcohol use, unspecified with intoxication, unspecified: Secondary | ICD-10-CM

## 2019-03-02 DIAGNOSIS — R4689 Other symptoms and signs involving appearance and behavior: Secondary | ICD-10-CM | POA: Diagnosis present

## 2019-03-02 DIAGNOSIS — F10229 Alcohol dependence with intoxication, unspecified: Secondary | ICD-10-CM | POA: Diagnosis not present

## 2019-03-02 DIAGNOSIS — F1924 Other psychoactive substance dependence with psychoactive substance-induced mood disorder: Secondary | ICD-10-CM | POA: Insufficient documentation

## 2019-03-02 DIAGNOSIS — I10 Essential (primary) hypertension: Secondary | ICD-10-CM | POA: Insufficient documentation

## 2019-03-02 DIAGNOSIS — Y908 Blood alcohol level of 240 mg/100 ml or more: Secondary | ICD-10-CM | POA: Diagnosis not present

## 2019-03-02 LAB — COMPREHENSIVE METABOLIC PANEL
ALT: 15 U/L (ref 0–44)
AST: 26 U/L (ref 15–41)
Albumin: 4 g/dL (ref 3.5–5.0)
Alkaline Phosphatase: 101 U/L (ref 38–126)
Anion gap: 9 (ref 5–15)
BUN: 17 mg/dL (ref 8–23)
CO2: 23 mmol/L (ref 22–32)
Calcium: 9.5 mg/dL (ref 8.9–10.3)
Chloride: 112 mmol/L — ABNORMAL HIGH (ref 98–111)
Creatinine, Ser: 1.05 mg/dL (ref 0.61–1.24)
GFR calc Af Amer: >60 mL/min (ref >60)
GFR calc non Af Amer: >60 mL/min (ref >60)
Glucose, Bld: 113 mg/dL — ABNORMAL HIGH (ref 70–99)
Potassium: 4 mmol/L (ref 3.5–5.1)
Sodium: 144 mmol/L (ref 135–145)
Total Bilirubin: 0.4 mg/dL (ref 0.3–1.2)
Total Protein: 7.5 g/dL (ref 6.5–8.1)

## 2019-03-02 LAB — CBC
HCT: 42.4 % (ref 39.0–52.0)
Hemoglobin: 12.4 g/dL — ABNORMAL LOW (ref 13.0–17.0)
MCH: 20 pg — ABNORMAL LOW (ref 26.0–34.0)
MCHC: 29.2 g/dL — ABNORMAL LOW (ref 30.0–36.0)
MCV: 68.3 fL — ABNORMAL LOW (ref 80.0–100.0)
Platelets: 310 10*3/uL (ref 150–400)
RBC: 6.21 MIL/uL — ABNORMAL HIGH (ref 4.22–5.81)
RDW: 22.2 % — ABNORMAL HIGH (ref 11.5–15.5)
WBC: 7.1 10*3/uL (ref 4.0–10.5)
nRBC: 0 % (ref 0.0–0.2)

## 2019-03-02 LAB — ETHANOL: Alcohol, Ethyl (B): 292 mg/dL — ABNORMAL HIGH (ref <10)

## 2019-03-02 LAB — SALICYLATE LEVEL: Salicylate Lvl: <7 mg/dL (ref 2.8–30.0)

## 2019-03-02 LAB — ACETAMINOPHEN LEVEL: Acetaminophen (Tylenol), Serum: <10 ug/mL — ABNORMAL LOW (ref 10–30)

## 2019-03-02 NOTE — ED Triage Notes (Signed)
Pt here with IVC papers with [employer] from [facility]. Pt is intoxicated. Per police they took IVC papers out on pt because he would not follow commands and they were concerned for his safety.

## 2019-03-02 NOTE — ED Provider Notes (Signed)
[facility] Emergency Department Provider Note  ____________________________________________   First MD Initiated Contact with Patient 03/02/19 2302     (approximate)  I have reviewed the triage vital signs and the nursing notes.   HISTORY  Chief Complaint Alcohol Intoxication and IVC  Level 5 caveat:  history/ROS limited by acute intoxication  HPI Omar Davis is a 68 y.o. male well-known to the emergency department for frequent behavioral/psychiatric visits and with chronic alcohol abuse.  He presents tonight under involuntary commitment for intoxication, erratic and aggressive behavior in public, and law enforcement feeling like he was a danger to himself and others.  He is currently easily agitated but lying down in bed.  Compared to prior visits he is relatively calm although he is quick to anger and quick to threaten physical aggression.  He says he just wants to be left alone for right now and would like to know when he can leave.  He denies any medical complaints but is minimally cooperative with exam and history.         Past Medical History:  Diagnosis Date  . Chronic pain   . Depressed   . Hypertension   . PTSD (post-traumatic stress disorder)   . TBI (traumatic brain injury) [facility])     Patient Active Problem List   Diagnosis Date Noted  . Sepsis ([place]) 02/08/2019  . Essential hypertension 02/05/2019  . Alcohol-induced mood disorder ([place]) 01/24/2019  . Alcohol dependence with intoxication, uncomplicated ([place]) [id number]  . Major depressive disorder, recurrent severe without psychotic features ([place]) 08/24/2018  . Suicidal ideation 09/25/2015  . Alcohol abuse 09/09/2015  . PTSD (post-traumatic stress disorder) 09/09/2015  . Agitation 09/09/2015  . Depressed   . Chronic pain     Past Surgical History:  Procedure Laterality Date  . BACK SURGERY    . HERNIA REPAIR    . KNEE SURGERY    . KNEE SURGERY    . prostectomy N/A      Prior to Admission medications   Medication Sig Start Date End Date Taking? Authorizing Provider  divalproex (DEPAKOTE ER) 500 MG 24 hr tablet Take 2 tablets (1,000 mg total) by mouth 2 (two) times daily. Patient not taking: Reported on 03/03/2019 02/07/19   Clapacs, Madie Reno, MD  DULoxetine (CYMBALTA) 30 MG capsule Take 1 capsule (30 mg total) by mouth daily. Patient not taking: Reported on 03/03/2019 02/07/19   Clapacs, Madie Reno, MD  lisinopril (ZESTRIL) 10 MG tablet Take 1 tablet (10 mg total) by mouth daily. Patient not taking: Reported on 03/03/2019 02/07/19   Clapacs, Madie Reno, MD  naproxen (NAPROSYN) 500 MG tablet Take 1 tablet (500 mg total) by mouth 2 (two) times daily as needed for moderate pain. Patient not taking: Reported on 03/03/2019 02/07/19   Clapacs, Madie Reno, MD  pravastatin (PRAVACHOL) 80 MG tablet Take 1 tablet (80 mg total) by mouth at bedtime. Patient not taking: Reported on 03/03/2019 02/07/19   Clapacs, Madie Reno, MD  thiamine 100 MG tablet Take 1 tablet (100 mg total) by mouth daily. Patient not taking: Reported on 03/03/2019 02/07/19   Clapacs, Madie Reno, MD    Allergies Codeine, Gabapentin, Penicillins, and Lisinopril  No family history on file.  Social History Social History   Tobacco Use  . Smoking status: Never Smoker  . Smokeless tobacco: Never Used  Substance Use Topics  . Alcohol use: Yes    Comment: 1 pint of vodka  . Drug use: No  Review of Systems Level 5 caveat:  history/ROS limited by acute intoxication  ____________________________________________   PHYSICAL EXAM:  VITAL SIGNS: ED Triage Vitals [03/02/19 2155]  Enc Vitals Group     BP (!) 171/80     Pulse Rate 95     Resp 14     Temp 97.9 F (36.6 C)     Temp Source Oral     SpO2 99 %     Weight 77.1 kg (170 lb)     Height 1.727 m (5\' 8" )     Head Circumference      Peak Flow      Pain Score 5     Pain Loc      Pain Edu?      Excl. in [place]?     Constitutional: Alert, no acute distress  but very labile emotionally, appears intoxicated, disheveled. Eyes: Conjunctivae are normal.  Head: Atraumatic. Nose: No congestion/rhinnorhea. Neck: No stridor.  No meningeal signs.   Cardiovascular: Normal rate, regular rhythm. Good peripheral circulation. Grossly normal heart sounds. Respiratory: Normal respiratory effort.  No retractions. Gastrointestinal: Soft and nontender. No distention.  Musculoskeletal: No lower extremity tenderness nor edema. No gross deformities of extremities. Neurologic:  Normal speech and language. No gross focal neurologic deficits are appreciated.  Skin:  Skin is warm, dry and intact. Psychiatric: Mood and affect are essentially at his baseline which is very emotionally labile, intermittently aggressive, intoxicated.  Not expressing any suicidal ideation nor homicidal ideation.  ____________________________________________   LABS (all labs ordered are listed, but only abnormal results are displayed)  Labs Reviewed  COMPREHENSIVE METABOLIC PANEL - Abnormal; Notable for the following components:      Result Value   Chloride 112 (*)    Glucose, Bld 113 (*)    All other components within normal limits  ETHANOL - Abnormal; Notable for the following components:   Alcohol, Ethyl (B) 292 (*)    All other components within normal limits  CBC - Abnormal; Notable for the following components:   RBC 6.21 (*)    Hemoglobin 12.4 (*)    MCV 68.3 (*)    MCH 20.0 (*)    MCHC 29.2 (*)    RDW 22.2 (*)    All other components within normal limits  ACETAMINOPHEN LEVEL - Abnormal; Notable for the following components:   Acetaminophen (Tylenol), Serum <10 (*)    All other components within normal limits  SALICYLATE LEVEL  URINE DRUG SCREEN, QUALITATIVE ([facility] ONLY)   ____________________________________________  EKG  None - EKG not ordered by ED physician ____________________________________________  RADIOLOGY Ursula Alert, personally viewed and evaluated  these images (plain radiographs) as part of my medical decision making, as well as reviewing the written report by the radiologist.  ED MD interpretation: No indication for emergent imaging  Official radiology report(s): No results found.  ____________________________________________   PROCEDURES   Procedure(s) performed (including Critical Care):  Procedures   ____________________________________________   INITIAL IMPRESSION / MDM / ASSESSMENT AND PLAN / ED COURSE  As part of my medical decision making, I reviewed the following data within the [place] notes reviewed and incorporated, Labs reviewed , Old chart reviewed, Notes from prior ED visits and [place] Controlled Substance Database   Differential diagnosis includes, but is not limited to, alcohol intoxication with history of alcohol abuse, other substance abuse, substance-induced mood disorder, PTSD, depression.  The patient is under involuntary commitment by law enforcement and does not have the capacity  to make his own decisions at this moment.  He was seen just a couple of days ago and released by psychiatry.  I see no indication for emergent psychiatric evaluation at this time.  I will let him sleep and he likely will require some degree of calming agent to be administered during the night as he frequently becomes aggressive before he is in a condition to be discharged.  Labs are essentially normal other than an alcohol level of 292.  He will be left alone under involuntary commitment at this time.      Clinical Course as of Mar 02 708  Sun Mar 03, 2019  0620 Patient has been calm and cooperative all night.  He does not meet inpatient psychiatric treatment criteria and no longer meets involuntary commitment criteria.  Once he is awake and alert and clinically sober he may be discharged.  He has just recently had multiple psychiatric evaluations and will not benefit from additional evaluation this  morning.  I gave my usual customary follow-up recommendations and return precautions.   [CF]    Clinical Course User Index [CF] Hinda Kehr, MD     ____________________________________________  FINAL CLINICAL IMPRESSION(S) / ED DIAGNOSES  Final diagnoses:  Alcoholic intoxication with complication [facility])     MEDICATIONS GIVEN DURING THIS VISIT:  Medications - No data to display   ED Discharge Orders    None      *Please note:  Demarion Overbaugh was evaluated in Emergency Department on 03/03/2019 for the symptoms described in the history of present illness. He was evaluated in the context of the global COVID-19 pandemic, which necessitated consideration that the patient might be at risk for infection with the SARS-CoV-2 virus that causes COVID-19. Institutional protocols and algorithms that pertain to the evaluation of patients at risk for COVID-19 are in a state of rapid change based on information released by regulatory bodies including the CDC and federal and state organizations. These policies and algorithms were followed during the patient's care in the ED.  Some ED evaluations and interventions may be delayed as a result of limited staffing during the pandemic.*  Note:  This document was prepared using Dragon voice recognition software and may include unintentional dictation errors.   Hinda Kehr, MD 03/03/19 [phone]

## 2019-03-02 NOTE — ED Notes (Signed)
Pt dressed out. Pt belongings bag:   1 pair black shoes  1 pair black socks 1 black belt 1 black pants 1 yellow shirt

## 2019-03-03 NOTE — ED Notes (Signed)
Patient discharged home, patient paid for a cab, patient received discharge papers. Patient received belongings and verbalized he has received all of his belongings. Patient appropriate and cooperative, Denies SI/HI AVH. Vital signs taken. NAD noted.

## 2019-03-03 NOTE — ED Notes (Signed)
BEHAVIORAL HEALTH ROUNDING Patient sleeping: No. Patient alert and oriented: yes Behavior appropriate: Yes.  ; If no, describe:  Nutrition and fluids offered: yes Toileting and hygiene offered: Yes  Sitter present: q15 minute observations and security monitoring Law enforcement present: Yes    

## 2019-03-03 NOTE — Discharge Instructions (Addendum)
You were seen in the emergency department for alcohol intoxication.  Please seek help from the recommended resources for assistance with your alcohol dependence.  If you have any thoughts of hurting yourself or others, please call 911 or return to the emergency department.  Please avoid drug and alcohol use.  Never drive a vehicle or operate machinery while intoxicated.

## 2019-03-05 ENCOUNTER — Emergency Department
Admission: EM | Admit: 2019-03-05 | Discharge: 2019-03-05 | Disposition: A | Discharge status: Home / Self Care | Payer: No Typology Code available for payment source | Attending: Student in an Organized Health Care Education/Training Program | Admitting: Student in an Organized Health Care Education/Training Program

## 2019-03-05 ENCOUNTER — Other Ambulatory Visit: Payer: Self-pay

## 2019-03-05 ENCOUNTER — Encounter: Payer: Self-pay | Admitting: *Deleted

## 2019-03-05 DIAGNOSIS — I10 Essential (primary) hypertension: Secondary | ICD-10-CM | POA: Insufficient documentation

## 2019-03-05 DIAGNOSIS — R4182 Altered mental status, unspecified: Secondary | ICD-10-CM | POA: Diagnosis present

## 2019-03-05 DIAGNOSIS — F1010 Alcohol abuse, uncomplicated: Secondary | ICD-10-CM | POA: Diagnosis not present

## 2019-03-05 DIAGNOSIS — Y908 Blood alcohol level of 240 mg/100 ml or more: Secondary | ICD-10-CM | POA: Diagnosis not present

## 2019-03-05 LAB — CBC
HCT: 39.7 % (ref 39.0–52.0)
Hemoglobin: 11.9 g/dL — ABNORMAL LOW (ref 13.0–17.0)
MCH: 20.2 pg — ABNORMAL LOW (ref 26.0–34.0)
MCHC: 30 g/dL (ref 30.0–36.0)
MCV: 67.5 fL — ABNORMAL LOW (ref 80.0–100.0)
Platelets: 258 10*3/uL (ref 150–400)
RBC: 5.88 MIL/uL — ABNORMAL HIGH (ref 4.22–5.81)
RDW: 21.6 % — ABNORMAL HIGH (ref 11.5–15.5)
WBC: 7.1 10*3/uL (ref 4.0–10.5)
nRBC: 0 % (ref 0.0–0.2)

## 2019-03-05 LAB — ACETAMINOPHEN LEVEL: Acetaminophen (Tylenol), Serum: <10 ug/mL — ABNORMAL LOW (ref 10–30)

## 2019-03-05 LAB — URINE DRUG SCREEN, QUALITATIVE (ARMC ONLY)
Amphetamines, Ur Screen: NOT DETECTED
Barbiturates, Ur Screen: NOT DETECTED
Benzodiazepine, Ur Scrn: NOT DETECTED
Cannabinoid 50 Ng, Ur ~~LOC~~: NOT DETECTED
Cocaine Metabolite,Ur ~~LOC~~: NOT DETECTED
MDMA (Ecstasy)Ur Screen: NOT DETECTED
Methadone Scn, Ur: NOT DETECTED
Opiate, Ur Screen: NOT DETECTED
Phencyclidine (PCP) Ur S: NOT DETECTED
Tricyclic, Ur Screen: NOT DETECTED

## 2019-03-05 LAB — COMPREHENSIVE METABOLIC PANEL
ALT: 15 U/L (ref 0–44)
AST: 22 U/L (ref 15–41)
Albumin: 4 g/dL (ref 3.5–5.0)
Alkaline Phosphatase: 89 U/L (ref 38–126)
Anion gap: 10 (ref 5–15)
BUN: 19 mg/dL (ref 8–23)
CO2: 21 mmol/L — ABNORMAL LOW (ref 22–32)
Calcium: 9.6 mg/dL (ref 8.9–10.3)
Chloride: 110 mmol/L (ref 98–111)
Creatinine, Ser: 1.06 mg/dL (ref 0.61–1.24)
GFR calc Af Amer: >60 mL/min (ref >60)
GFR calc non Af Amer: >60 mL/min (ref >60)
Glucose, Bld: 105 mg/dL — ABNORMAL HIGH (ref 70–99)
Potassium: 3.9 mmol/L (ref 3.5–5.1)
Sodium: 141 mmol/L (ref 135–145)
Total Bilirubin: 0.6 mg/dL (ref 0.3–1.2)
Total Protein: 7.1 g/dL (ref 6.5–8.1)

## 2019-03-05 LAB — SALICYLATE LEVEL: Salicylate Lvl: <7 mg/dL (ref 2.8–30.0)

## 2019-03-05 LAB — ETHANOL: Alcohol, Ethyl (B): 240 mg/dL — ABNORMAL HIGH (ref <10)

## 2019-03-05 MED ORDER — VITAMIN B-1 100 MG PO TABS
100.0000 mg | ORAL_TABLET | Freq: Once | ORAL | Status: AC
  Administered 2019-03-05: 100 mg via ORAL
  Filled 2019-03-05: qty 1

## 2019-03-05 NOTE — ED Provider Notes (Addendum)
[facility] Emergency Department Provider Note    First MD Initiated Contact with Patient 03/05/19 1808     (approximate)  I have reviewed the triage vital signs and the nursing notes.   HISTORY  Chief Complaint Altered Mental Status    HPI Omar Davis is a 68 y.o. male plosive past medical history presents the ER for evaluation of intoxication.  Admits to drinking alcohol today.  States he took his prescribed trazodone medication this morning.  Denies any SI or HI.  States he was seen by police department as he felt intoxicated and started having flashbacks of [place].  He is not having any flashbacks or hallucinations right now he is requesting discharge home.  Patient appears at his baseline is well-known to this facility.  Has had extensive evaluation by psychiatry presentation seems primarily related to alcohol dependence.  Patient declining detox or assistance with his alcohol dependence.    Past Medical History:  Diagnosis Date  . Chronic pain   . Depressed   . Hypertension   . PTSD (post-traumatic stress disorder)   . TBI (traumatic brain injury) ([place])    No family history on file. Past Surgical History:  Procedure Laterality Date  . BACK SURGERY    . HERNIA REPAIR    . KNEE SURGERY    . KNEE SURGERY    . prostectomy N/A    Patient Active Problem List   Diagnosis Date Noted  . Sepsis ([place]) 02/08/2019  . Essential hypertension 02/05/2019  . Alcohol-induced mood disorder ([place]) 01/24/2019  . Alcohol dependence with intoxication, uncomplicated ([place]) [id number]  . Major depressive disorder, recurrent severe without psychotic features ([place]) 08/24/2018  . Suicidal ideation 09/25/2015  . Alcohol abuse 09/09/2015  . PTSD (post-traumatic stress disorder) 09/09/2015  . Agitation 09/09/2015  . Depressed   . Chronic pain       Prior to Admission medications   Medication Sig Start Date End Date Taking? Authorizing Provider   divalproex (DEPAKOTE ER) 500 MG 24 hr tablet Take 2 tablets (1,000 mg total) by mouth 2 (two) times daily. Patient not taking: Reported on 03/03/2019 02/07/19   Clapacs, Madie Reno, MD  DULoxetine (CYMBALTA) 30 MG capsule Take 1 capsule (30 mg total) by mouth daily. Patient not taking: Reported on 03/03/2019 02/07/19   Clapacs, Madie Reno, MD  lisinopril (ZESTRIL) 10 MG tablet Take 1 tablet (10 mg total) by mouth daily. Patient not taking: Reported on 03/03/2019 02/07/19   Clapacs, Madie Reno, MD  naproxen (NAPROSYN) 500 MG tablet Take 1 tablet (500 mg total) by mouth 2 (two) times daily as needed for moderate pain. Patient not taking: Reported on 03/03/2019 02/07/19   Clapacs, Madie Reno, MD  pravastatin (PRAVACHOL) 80 MG tablet Take 1 tablet (80 mg total) by mouth at bedtime. Patient not taking: Reported on 03/03/2019 02/07/19   Clapacs, Madie Reno, MD  thiamine 100 MG tablet Take 1 tablet (100 mg total) by mouth daily. Patient not taking: Reported on 03/03/2019 02/07/19   Clapacs, Madie Reno, MD    Allergies Codeine, Gabapentin, Penicillins, and Lisinopril    Social History Social History   Tobacco Use  . Smoking status: Never Smoker  . Smokeless tobacco: Never Used  Substance Use Topics  . Alcohol use: Yes    Comment: 1 pint of vodka  . Drug use: Not Currently    Review of Systems Patient denies headaches, rhinorrhea, blurry vision, numbness, shortness of breath, chest pain, edema, cough, abdominal pain, nausea,  vomiting, diarrhea, dysuria, fevers, rashes or hallucinations unless otherwise stated above in HPI. ____________________________________________   PHYSICAL EXAM:  VITAL SIGNS: Vitals:   03/05/19 1707 03/05/19 1923  BP: 90/63 131/70  Pulse: 86 88  Resp: 18 18  Temp: 97.8 F (36.6 C) 98.8 F (37.1 C)  SpO2: 98% 98%    Constitutional: Alert, calm and cooperative Eyes: Conjunctivae are normal.  Head: Atraumatic. Nose: No congestion/rhinnorhea. Mouth/Throat: Mucous membranes are  moist.   Neck: No stridor. Painless ROM.  Cardiovascular: Normal rate, regular rhythm. Grossly normal heart sounds.  Good peripheral circulation. Respiratory: Normal respiratory effort.  No retractions. Lungs CTAB. Gastrointestinal: Soft and nontender. No distention. No abdominal bruits. No CVA tenderness. Genitourinary:  Musculoskeletal: No lower extremity tenderness nor edema.  No joint effusions. Neurologic:  Normal speech and language. No gross focal neurologic deficits are appreciated. No facial droop, ambulates with steady gait Skin:  Skin is warm, dry and intact. No rash noted. Psychiatric calm and cooperative, states he was having flashbacks when intoxicated but feels better now having time to sober up in the ER. Denies SI or HI.  ____________________________________________   LABS (all labs ordered are listed, but only abnormal results are displayed)  Results for orders placed or performed during the hospital encounter of 03/05/19 (from the past 24 hour(s))  Comprehensive metabolic panel     Status: Abnormal   Collection Time: 03/05/19  5:31 PM  Result Value Ref Range   Sodium 141 135 - 145 mmol/L   Potassium 3.9 3.5 - 5.1 mmol/L   Chloride 110 98 - 111 mmol/L   CO2 21 (L) 22 - 32 mmol/L   Glucose, Bld 105 (H) 70 - 99 mg/dL   BUN 19 8 - 23 mg/dL   Creatinine, Ser 1.06 0.61 - 1.24 mg/dL   Calcium 9.6 8.9 - 10.3 mg/dL   Total Protein 7.1 6.5 - 8.1 g/dL   Albumin 4.0 3.5 - 5.0 g/dL   AST 22 15 - 41 U/L   ALT 15 0 - 44 U/L   Alkaline Phosphatase 89 38 - 126 U/L   Total Bilirubin 0.6 0.3 - 1.2 mg/dL   GFR calc non Af Amer >60 >60 mL/min   GFR calc Af Amer >60 >60 mL/min   Anion gap 10 5 - 15  Ethanol     Status: Abnormal   Collection Time: 03/05/19  5:31 PM  Result Value Ref Range   Alcohol, Ethyl (B) 240 (H) <10 mg/dL  cbc     Status: Abnormal   Collection Time: 03/05/19  5:31 PM  Result Value Ref Range   WBC 7.1 4.0 - 10.5 K/uL   RBC 5.88 (H) 4.22 - 5.81 MIL/uL    Hemoglobin 11.9 (L) 13.0 - 17.0 g/dL   HCT 39.7 39.0 - 52.0 %   MCV 67.5 (L) 80.0 - 100.0 fL   MCH 20.2 (L) 26.0 - 34.0 pg   MCHC 30.0 30.0 - 36.0 g/dL   RDW 21.6 (H) 11.5 - 15.5 %   Platelets 258 150 - 400 K/uL   nRBC 0.0 0.0 - 0.2 %  Urine Drug Screen, Qualitative     Status: None   Collection Time: 03/05/19  5:31 PM  Result Value Ref Range   Tricyclic, Ur Screen NONE DETECTED NONE DETECTED   Amphetamines, Ur Screen NONE DETECTED NONE DETECTED   MDMA (Ecstasy)Ur Screen NONE DETECTED NONE DETECTED   Cocaine Metabolite,Ur Rea NONE DETECTED NONE DETECTED   Opiate, Ur Screen NONE DETECTED NONE DETECTED  Phencyclidine (PCP) Ur S NONE DETECTED NONE DETECTED   Cannabinoid 50 Ng, Ur  NONE DETECTED NONE DETECTED   Barbiturates, Ur Screen NONE DETECTED NONE DETECTED   Benzodiazepine, Ur Scrn NONE DETECTED NONE DETECTED   Methadone Scn, Ur NONE DETECTED NONE DETECTED  Salicylate level     Status: None   Collection Time: 03/05/19  5:31 PM  Result Value Ref Range   Salicylate Lvl [id number] 2.8 - 30.0 mg/dL  Acetaminophen level     Status: Abnormal   Collection Time: 03/05/19  5:31 PM  Result Value Ref Range   Acetaminophen (Tylenol), Serum <10 (L) 10 - 30 ug/mL   ____________________________________________  EKG____________________________________________  RADIOLOGY   ____________________________________________   PROCEDURES  Procedure(s) performed:  Procedures    Critical Care performed: no ____________________________________________   INITIAL IMPRESSION / ASSESSMENT AND PLAN / ED COURSE  Pertinent labs & imaging results that were available during my care of the patient were reviewed by me and considered in my medical decision making (see chart for details).   DDX: Psychosis, delirium, medication effect, noncompliance, polysubstance abuse, Si, Hi, depression   Omar Davis is a 68 y.o. who presents to the ED with alcohol dependence PTSD.  He is here voluntarily  brought by [employer].  He denies any SI or HI.  Not having any hallucinations or flashbacks.  Seems calm and cooperative.  Presentation encounter with police in the setting of intoxication.  He is declining any assistance with his substance abuse issues.  Do not see any indication for psychiatric evaluation or IVC at this time patient will be observed in the ER until clinically sober and appropriate for discharge home.  Clinical Course as of Mar 05 1947  Tue Mar 05, 2019  1943 Patient ambulated with steady gait.  Remains without any SI or HI.  No odd behavior.  No evidence of aggressive behavior.  I do believe he is appropriate for discharge at this time.   [PR]    Clinical Course User Index [PR] Merlyn Lot, MD    The patient was evaluated in Emergency Department today for the symptoms described in the history of present illness. He/she was evaluated in the context of the global COVID-19 pandemic, which necessitated consideration that the patient might be at risk for infection with the SARS-CoV-2 virus that causes COVID-19. Institutional protocols and algorithms that pertain to the evaluation of patients at risk for COVID-19 are in a state of rapid change based on information released by regulatory bodies including the CDC and federal and state organizations. These policies and algorithms were followed during the patient's care in the ED.  As part of my medical decision making, I reviewed the following data within the [place] notes reviewed and incorporated, Labs reviewed, notes from prior ED visits and [place] Controlled Substance Database   ____________________________________________   FINAL CLINICAL IMPRESSION(S) / ED DIAGNOSES  Final diagnoses:  Alcohol abuse      NEW MEDICATIONS STARTED DURING THIS VISIT:  New Prescriptions   No medications on file     Note:  This document was prepared using Dragon voice recognition software and  may include unintentional dictation errors.    Merlyn Lot, MD 03/05/19 Allegra Lai    Merlyn Lot, MD 03/05/19 1944    Merlyn Lot, MD 03/05/19 [phone]

## 2019-03-05 NOTE — ED Notes (Signed)
Patient is stable in NAD. He is discharged to home. Patient belongings given to patient. Discharge instruction reviewed. Patient verbalized understanding. No issues.

## 2019-03-05 NOTE — ED Notes (Signed)
VOL  

## 2019-03-05 NOTE — ED Notes (Signed)
Assumed care of  patient dress out in triage, patient with ETOH reports SI/HI. Patient to room , awaiting md eval and plan of care. Labs sent from triage. Safety maintained. sandwich box given. Patient currently calm and cooperative, denies SI/HV

## 2019-03-05 NOTE — ED Notes (Signed)
Pt dressed out by this tech and [employer] Officer. Pt Belongings:  Black and gray pants Purple shirt Black shoes Black socks (1) [employer]

## 2019-03-05 NOTE — ED Triage Notes (Signed)
Patient was brought in by [facility] for voluntary commit. Patient drank a fifth of alcohol today and took 3 pill of Trazadone 100mg  per patient's report. Patient states he drinks daily and takes 3-4 pills of Trazadone daily for right arm pain from [place]. Patient is swearing, loud, making threatening gestures to others. Patient is occasionally cooperative. Patient is here voluntarily to make sure "the alcohol and pills aren't causing a problem." Patient's live-in girlfriend called for police, stating patient was acting "oddly." Patient states he is not here to "fuck anyone over like the government did to him in [place]."

## 2019-03-05 NOTE — ED Notes (Signed)
Report to include Situation, Background, Assessment, and Recommendations received from Jeanette RN. Patient alert and oriented, warm and dry, in no acute distress. Patient denies SI, HI, AVH and pain. Patient made aware of Q15 minute rounds and [place] and Officer presence for their safety. Patient instructed to come to me with needs or concerns.   

## 2019-03-11 ENCOUNTER — Emergency Department
Admission: EM | Admit: 2019-03-11 | Discharge: 2019-03-12 | Disposition: A | Discharge status: Home / Self Care | Payer: No Typology Code available for payment source | Attending: Student | Admitting: Student

## 2019-03-11 DIAGNOSIS — Z87820 Personal history of traumatic brain injury: Secondary | ICD-10-CM | POA: Diagnosis not present

## 2019-03-11 DIAGNOSIS — F1020 Alcohol dependence, uncomplicated: Secondary | ICD-10-CM | POA: Diagnosis present

## 2019-03-11 DIAGNOSIS — F4310 Post-traumatic stress disorder, unspecified: Secondary | ICD-10-CM | POA: Diagnosis present

## 2019-03-11 DIAGNOSIS — A419 Sepsis, unspecified organism: Secondary | ICD-10-CM | POA: Diagnosis present

## 2019-03-11 DIAGNOSIS — R451 Restlessness and agitation: Secondary | ICD-10-CM | POA: Insufficient documentation

## 2019-03-11 DIAGNOSIS — F1024 Alcohol dependence with alcohol-induced mood disorder: Secondary | ICD-10-CM | POA: Diagnosis not present

## 2019-03-11 DIAGNOSIS — F1010 Alcohol abuse, uncomplicated: Secondary | ICD-10-CM

## 2019-03-11 DIAGNOSIS — G8929 Other chronic pain: Secondary | ICD-10-CM | POA: Diagnosis present

## 2019-03-11 DIAGNOSIS — F10129 Alcohol abuse with intoxication, unspecified: Secondary | ICD-10-CM | POA: Diagnosis present

## 2019-03-11 DIAGNOSIS — I10 Essential (primary) hypertension: Secondary | ICD-10-CM | POA: Insufficient documentation

## 2019-03-11 DIAGNOSIS — F332 Major depressive disorder, recurrent severe without psychotic features: Secondary | ICD-10-CM | POA: Insufficient documentation

## 2019-03-11 DIAGNOSIS — R45851 Suicidal ideations: Secondary | ICD-10-CM | POA: Diagnosis not present

## 2019-03-11 DIAGNOSIS — Z046 Encounter for general psychiatric examination, requested by authority: Secondary | ICD-10-CM

## 2019-03-11 DIAGNOSIS — F10220 Alcohol dependence with intoxication, uncomplicated: Secondary | ICD-10-CM | POA: Diagnosis present

## 2019-03-11 DIAGNOSIS — F329 Major depressive disorder, single episode, unspecified: Secondary | ICD-10-CM | POA: Diagnosis present

## 2019-03-11 DIAGNOSIS — F10921 Alcohol use, unspecified with intoxication delirium: Secondary | ICD-10-CM | POA: Diagnosis not present

## 2019-03-11 DIAGNOSIS — F32A Depression, unspecified: Secondary | ICD-10-CM | POA: Diagnosis present

## 2019-03-11 DIAGNOSIS — F1094 Alcohol use, unspecified with alcohol-induced mood disorder: Secondary | ICD-10-CM | POA: Diagnosis present

## 2019-03-11 LAB — COMPREHENSIVE METABOLIC PANEL
ALT: 16 U/L (ref 0–44)
AST: 23 U/L (ref 15–41)
Albumin: 4 g/dL (ref 3.5–5.0)
Alkaline Phosphatase: 83 U/L (ref 38–126)
Anion gap: 9 (ref 5–15)
BUN: 18 mg/dL (ref 8–23)
CO2: 25 mmol/L (ref 22–32)
Calcium: 9.4 mg/dL (ref 8.9–10.3)
Chloride: 111 mmol/L (ref 98–111)
Creatinine, Ser: 1.07 mg/dL (ref 0.61–1.24)
GFR calc Af Amer: >60 mL/min (ref >60)
GFR calc non Af Amer: >60 mL/min (ref >60)
Glucose, Bld: 103 mg/dL — ABNORMAL HIGH (ref 70–99)
Potassium: 4 mmol/L (ref 3.5–5.1)
Sodium: 145 mmol/L (ref 135–145)
Total Bilirubin: 0.5 mg/dL (ref 0.3–1.2)
Total Protein: 7.1 g/dL (ref 6.5–8.1)

## 2019-03-11 LAB — CBC
HCT: 39.3 % (ref 39.0–52.0)
Hemoglobin: 11.5 g/dL — ABNORMAL LOW (ref 13.0–17.0)
MCH: 20.2 pg — ABNORMAL LOW (ref 26.0–34.0)
MCHC: 29.3 g/dL — ABNORMAL LOW (ref 30.0–36.0)
MCV: 69.2 fL — ABNORMAL LOW (ref 80.0–100.0)
Platelets: 206 10*3/uL (ref 150–400)
RBC: 5.68 MIL/uL (ref 4.22–5.81)
RDW: 21.6 % — ABNORMAL HIGH (ref 11.5–15.5)
WBC: 6.8 10*3/uL (ref 4.0–10.5)
nRBC: 0 % (ref 0.0–0.2)

## 2019-03-11 LAB — URINE DRUG SCREEN, QUALITATIVE (ARMC ONLY)
Amphetamines, Ur Screen: NOT DETECTED
Barbiturates, Ur Screen: NOT DETECTED
Benzodiazepine, Ur Scrn: NOT DETECTED
Cannabinoid 50 Ng, Ur ~~LOC~~: NOT DETECTED
Cocaine Metabolite,Ur ~~LOC~~: NOT DETECTED
MDMA (Ecstasy)Ur Screen: NOT DETECTED
Methadone Scn, Ur: NOT DETECTED
Opiate, Ur Screen: NOT DETECTED
Phencyclidine (PCP) Ur S: NOT DETECTED
Tricyclic, Ur Screen: NOT DETECTED

## 2019-03-11 LAB — ETHANOL: Alcohol, Ethyl (B): 277 mg/dL — ABNORMAL HIGH (ref <10)

## 2019-03-11 LAB — ACETAMINOPHEN LEVEL: Acetaminophen (Tylenol), Serum: <10 ug/mL — ABNORMAL LOW (ref 10–30)

## 2019-03-11 LAB — SALICYLATE LEVEL: Salicylate Lvl: <7 mg/dL (ref 2.8–30.0)

## 2019-03-11 MED ORDER — DROPERIDOL 2.5 MG/ML IJ SOLN
5.0000 mg | Freq: Once | INTRAMUSCULAR | Status: AC
  Administered 2019-03-11: 20:00:00 5 mg via INTRAMUSCULAR
  Filled 2019-03-11: qty 2

## 2019-03-11 MED ORDER — DROPERIDOL 2.5 MG/ML IJ SOLN: 10.0000 mg | Freq: Once | INTRAMUSCULAR | Status: DC

## 2019-03-11 NOTE — ED Notes (Signed)
Patient is screaming and yelling to the point scaring other patients. He is making an advance on male staff and using  inappropriate sexual language. Patient unable to redirect and calm down. EDP notified.

## 2019-03-11 NOTE — ED Notes (Signed)
Hourly rounding reveals patient in room. No complaints, stable, in no acute distress. Q15 minute rounds and monitoring via Rover and Officer to continue.   

## 2019-03-11 NOTE — ED Triage Notes (Signed)
Patient has hx of Alcohol abuse. He is here IVC'd via PD for voicing to harm himslef.

## 2019-03-11 NOTE — ED Notes (Signed)
TTS unable to assess at this time due to him being intoxicated and unable to participate at this time.

## 2019-03-11 NOTE — Consult Note (Signed)
[facility] Face-to-Face Psychiatry Consult   Reason for Consult: Psychiatric evaluation Referring Physician: Dr. Joan Mayans Patient Identification: Omar Davis MRN:  [id number] Principal Diagnosis: Alcohol abuse,  Alcohol dependence with intoxication, uncomplicated ([place]) Alcohol-induced mood disorder ([place]) Chronic pain Diagnosis: Agitation. Depressed None essential hypertension Major depressive disorder, recurrent severe without psychotic features ([place]) PTSD (posttraumatic stress disorder) Sepsis ([place]) Suicidal ideation  Total Time spent with patient: 30 minutes  Subjective: "No I do not want to hurt myself. I say that when I drink" Omar Davis is a 68 y.o. male patient presented to [facility] ED via law enforcement under involuntary commitment status (IVC).  Per the ED triage nursing note, the initial patient assessment, he is voiced that he was suicidal. The patient's ETOH level is 277 mg/dl.  Per the patient assessment by this provider, he denies suicidal ideation and self-injuries behavior. The patient was seen face-to-face by this provider; chart reviewed and consulted with Dr. Karma Greaser on 03/11/2019 due to the patient's care. It was discussed with the EDP that the patient does not meet the criteria for psychiatric inpatient admission.  The patient can be discharged in the a.m. when he is sober and safe to leave the hospital.  This behavior is a continuous behavior of the patient.  He voiced to this provider that he is hoping that this would be the last time he drinks alcohol.  He states, "I do not like how it makes me feel."  The patient is alert and oriented x3, calm, cooperative, and mood-congruent with affect on evaluation. The patient does not appear to be responding to internal or external stimuli. Neither is the patient presenting with any delusional thinking. The patient denies auditory or visual hallucinations. The patient denies any suicidal, homicidal, or self-harm ideations. The  patient is not presenting with any psychotic or paranoid behaviors. During an encounter with the patient, he was able to answer questions appropriately. Collateral was not obtained by this provider.  Plan: The patient is not a safety risk to self or others. The patient will be observed overnight and discharged in the a.m when he is sober and safe to go home.Marland Kitchen   HPI: Per EDP; Omar Davis is a 68 y.o. male who presents to the emergency department today under IVC because of concerns for suicidal ideation.  The patient does admit to drinking today.  He had been discharged from the emergency department earlier today for similar complaints.  At the time my exam the patient denies any SI.  He denies any recent illness.   Records reviewed. Per medical record review patient has lots of visit to emergency department due to him being intoxicated, belligerent, verbally abusive who comes in contact with him and unsafe while he is intoxicated.  Past Psychiatric History:  Chronic pain Depressed PTSD (post-traumatic stress disorder) TBI (traumatic brain injury) ([place])   Risk to Self:  No Risk to Others:  No Prior Inpatient Therapy:  Yes Prior Outpatient Therapy:  Yes  Past Medical History:  Past Medical History:  Diagnosis Date  . Chronic pain   . Depressed   . Hypertension   . PTSD (post-traumatic stress disorder)   . TBI (traumatic brain injury) [facility])     Past Surgical History:  Procedure Laterality Date  . BACK SURGERY    . HERNIA REPAIR    . KNEE SURGERY    . KNEE SURGERY    . prostectomy N/A    Family History: No family history on file. Family Psychiatric  History: None provided Social History:  Social History   Substance and Sexual Activity  Alcohol Use Yes   Comment: 1 pint of vodka     Social History   Substance and Sexual Activity  Drug Use Not Currently    Social History   Socioeconomic History  . Marital status: Legally Separated    Spouse name: Not on file   . Number of children: Not on file  . Years of education: Not on file  . Highest education level: Not on file  Occupational History  . Not on file  Social Needs  . Financial resource strain: Not on file  . Food insecurity    Worry: Not on file    Inability: Not on file  . Transportation needs    Medical: Not on file    Non-medical: Not on file  Tobacco Use  . Smoking status: Never Smoker  . Smokeless tobacco: Never Used  Substance and Sexual Activity  . Alcohol use: Yes    Comment: 1 pint of vodka  . Drug use: Not Currently  . Sexual activity: Not Currently  Lifestyle  . Physical activity    Days per week: Not on file    Minutes per session: Not on file  . Stress: Not on file  Relationships  . Social Herbalist on phone: Not on file    Gets together: Not on file    Attends religious service: Not on file    Active member of club or organization: Not on file    Attends meetings of clubs or organizations: Not on file    Relationship status: Not on file  Other Topics Concern  . Not on file  Social History Narrative  . Not on file   Additional Social History:    Allergies:   Allergies  Allergen Reactions  . Codeine Hives and Rash  . Gabapentin Other (See Comments) and Anaphylaxis    seizures Other reaction(s): Other (See Comments) seizures Anaphylactic Shock   . Penicillins Rash    Other reaction(s): Other (See Comments) Other Reaction: Not Assessed Rash   . Lisinopril Swelling    Pt reports lip swelling    Labs:  Results for orders placed or performed during the hospital encounter of 03/11/19 (from the past 48 hour(s))  Comprehensive metabolic panel     Status: Abnormal   Collection Time: 03/11/19  7:41 PM  Result Value Ref Range   Sodium 145 135 - 145 mmol/L   Potassium 4.0 3.5 - 5.1 mmol/L   Chloride 111 98 - 111 mmol/L   CO2 25 22 - 32 mmol/L   Glucose, Bld 103 (H) 70 - 99 mg/dL   BUN 18 8 - 23 mg/dL   Creatinine, Ser 1.07 0.61 - 1.24  mg/dL   Calcium 9.4 8.9 - 10.3 mg/dL   Total Protein 7.1 6.5 - 8.1 g/dL   Albumin 4.0 3.5 - 5.0 g/dL   AST 23 15 - 41 U/L   ALT 16 0 - 44 U/L   Alkaline Phosphatase 83 38 - 126 U/L   Total Bilirubin 0.5 0.3 - 1.2 mg/dL   GFR calc non Af Amer >60 >60 mL/min   GFR calc Af Amer >60 >60 mL/min   Anion gap 9 5 - 15    Comment: Performed at [facility], [address] [place], [place] [address]  Ethanol     Status: Abnormal   Collection Time: 03/11/19  7:41 PM  Result Value Ref  Range   Alcohol, Ethyl (B) 277 (H) <10 mg/dL    Comment: (NOTE) Lowest detectable limit for serum alcohol is 10 mg/dL. For medical purposes only. Performed at [facility], [address] [place], [place] [address]   cbc     Status: Abnormal   Collection Time: 03/11/19  7:41 PM  Result Value Ref Range   WBC 6.8 4.0 - 10.5 K/uL   RBC 5.68 4.22 - 5.81 MIL/uL   Hemoglobin 11.5 (L) 13.0 - 17.0 g/dL   HCT 39.3 39.0 - 52.0 %   MCV 69.2 (L) 80.0 - 100.0 fL   MCH 20.2 (L) 26.0 - 34.0 pg   MCHC 29.3 (L) 30.0 - 36.0 g/dL   RDW 21.6 (H) 11.5 - 15.5 %   Platelets 206 150 - 400 K/uL   nRBC 0.0 0.0 - 0.2 %    Comment: Performed at [facility], [address] [place], [place] [address]  Acetaminophen level     Status: Abnormal   Collection Time: 03/11/19  7:41 PM  Result Value Ref Range   Acetaminophen (Tylenol), Serum <10 (L) 10 - 30 ug/mL    Comment: (NOTE) Therapeutic concentrations vary significantly. A range of 10-30 ug/mL  may be an effective concentration for many patients. However, some  are best treated at concentrations outside of this range. Acetaminophen concentrations >150 ug/mL at 4 hours after ingestion  and >50 ug/mL at 12 hours after ingestion are often associated with  toxic reactions. Performed at [facility], [address] [place], [place] [id number]   Salicylate level     Status: None   Collection Time: 03/11/19  7:41 PM  Result Value Ref Range    Salicylate Lvl [id number] 2.8 - 30.0 mg/dL    Comment: Performed at [facility], [address] [place], [place] [address]  Urine Drug Screen, Qualitative     Status: None   Collection Time: 03/11/19  7:51 PM  Result Value Ref Range   Tricyclic, Ur Screen NONE DETECTED NONE DETECTED   Amphetamines, Ur Screen NONE DETECTED NONE DETECTED   MDMA (Ecstasy)Ur Screen NONE DETECTED NONE DETECTED   Cocaine Metabolite,Ur [place] NONE DETECTED NONE DETECTED   Opiate, Ur Screen NONE DETECTED NONE DETECTED   Phencyclidine (PCP) Ur S NONE DETECTED NONE DETECTED   Cannabinoid 50 Ng, Ur [place] NONE DETECTED NONE DETECTED   Barbiturates, Ur Screen NONE DETECTED NONE DETECTED   Benzodiazepine, Ur Scrn NONE DETECTED NONE DETECTED   Methadone Scn, Ur NONE DETECTED NONE DETECTED    Comment: (NOTE) Tricyclics + metabolites, urine    Cutoff 1000 ng/mL Amphetamines + metabolites, urine  Cutoff 1000 ng/mL MDMA (Ecstasy), urine              Cutoff 500 ng/mL Cocaine Metabolite, urine          Cutoff 300 ng/mL Opiate + metabolites, urine        Cutoff 300 ng/mL Phencyclidine (PCP), urine         Cutoff 25 ng/mL Cannabinoid, urine                 Cutoff 50 ng/mL Barbiturates + metabolites, urine  Cutoff 200 ng/mL Benzodiazepine, urine              Cutoff 200 ng/mL Methadone, urine                   Cutoff 300 ng/mL The urine drug screen provides only a preliminary, unconfirmed analytical test  result and should not be used for non-medical purposes. Clinical consideration and professional judgment should be applied to any positive drug screen result due to possible interfering substances. A more specific alternate chemical method must be used in order to obtain a confirmed analytical result. Gas chromatography / mass spectrometry (GC/MS) is the preferred confirmat ory method. Performed at [facility], [address] [place], [place] [address]     No current facility-administered medications for  this encounter.    Current Outpatient Medications  Medication Sig Dispense Refill  . divalproex (DEPAKOTE ER) 500 MG 24 hr tablet Take 2 tablets (1,000 mg total) by mouth 2 (two) times daily. (Patient not taking: Reported on 03/03/2019) 120 tablet 0  . DULoxetine (CYMBALTA) 30 MG capsule Take 1 capsule (30 mg total) by mouth daily. (Patient not taking: Reported on 03/03/2019) 30 capsule 0  . lisinopril (ZESTRIL) 10 MG tablet Take 1 tablet (10 mg total) by mouth daily. (Patient not taking: Reported on 03/03/2019) 30 tablet 0  . naproxen (NAPROSYN) 500 MG tablet Take 1 tablet (500 mg total) by mouth 2 (two) times daily as needed for moderate pain. (Patient not taking: Reported on 03/03/2019) 60 tablet 0  . pravastatin (PRAVACHOL) 80 MG tablet Take 1 tablet (80 mg total) by mouth at bedtime. (Patient not taking: Reported on 03/03/2019) 30 tablet 0  . thiamine 100 MG tablet Take 1 tablet (100 mg total) by mouth daily. (Patient not taking: Reported on 03/03/2019) 30 tablet 0    Musculoskeletal: Strength & Muscle Tone: within normal limits Gait & Station: normal Patient leans: N/A  Psychiatric Specialty Exam: Physical Exam  Nursing note and vitals reviewed. Constitutional: He is oriented to person, place, and time. He appears well-developed.  Neck: Normal range of motion. Neck supple.  Respiratory: Effort normal.  Musculoskeletal: Normal range of motion.  Neurological: He is alert and oriented to person, place, and time.    Review of Systems  Psychiatric/Behavioral: Positive for substance abuse. The patient is nervous/anxious.     Blood pressure (!) 149/94, pulse 89, temperature 98.9 F (37.2 C), temperature source Oral, resp. rate 17, height 5\' 8"  (1.727 m), weight 74.8 kg, SpO2 97 %.Body mass index is 25.09 kg/m.  General Appearance: Casual  Eye Contact:  Minimal  Speech:  Clear and Coherent and but with mostly yes and no answers.  Volume:  Decreased  Mood:  Depressed  Affect:   Congruent, Depressed and Flat  Thought Process:  Coherent  Orientation:  Full (Time, Place, and Person)  Thought Content:  Logical  Suicidal Thoughts:  No  Homicidal Thoughts:  No  Memory:  Immediate;   Fair Recent;   Fair Remote;   Fair  Judgement:  Poor  Insight:  Lacking  Psychomotor Activity:  Decreased  Concentration:  Concentration: Poor and Attention Span: Poor  Recall:  Poor  Fund of Knowledge:  Poor  Language:  Fair  Akathisia:  Negative  Handed:  Right  AIMS (if indicated):     Assets:  Desire for Improvement Physical Health Social Support  ADL's:  Intact  Cognition:  Impaired,  Mild  Sleep:   Good     Treatment Plan Summary: Medication management and Plan Patient will be observed overnight and discharge in the A.M. once collateral can be obtained.   Disposition: No evidence of imminent risk to self or others at present.   Patient does not meet criteria for psychiatric inpatient admission. Supportive therapy provided about ongoing stressors.  Caroline Sauger, NP  03/11/2019 11:44 PM

## 2019-03-12 ENCOUNTER — Ambulatory Visit: Payer: Medicare HMO

## 2019-03-12 ENCOUNTER — Emergency Department (EMERGENCY_DEPARTMENT_HOSPITAL)
Admission: EM | Admit: 2019-03-12 | Discharge: 2019-03-13 | Disposition: A | Discharge status: Other Acute Inpatient Hospital | Payer: No Typology Code available for payment source | Source: Home / Self Care | Attending: Emergency Medicine | Admitting: Emergency Medicine

## 2019-03-12 DIAGNOSIS — Y908 Blood alcohol level of 240 mg/100 ml or more: Secondary | ICD-10-CM | POA: Insufficient documentation

## 2019-03-12 DIAGNOSIS — R45851 Suicidal ideations: Secondary | ICD-10-CM | POA: Insufficient documentation

## 2019-03-12 DIAGNOSIS — F10121 Alcohol abuse with intoxication delirium: Secondary | ICD-10-CM | POA: Insufficient documentation

## 2019-03-12 DIAGNOSIS — I10 Essential (primary) hypertension: Secondary | ICD-10-CM | POA: Insufficient documentation

## 2019-03-12 DIAGNOSIS — F1010 Alcohol abuse, uncomplicated: Secondary | ICD-10-CM

## 2019-03-12 DIAGNOSIS — F10921 Alcohol use, unspecified with intoxication delirium: Secondary | ICD-10-CM

## 2019-03-12 DIAGNOSIS — F329 Major depressive disorder, single episode, unspecified: Secondary | ICD-10-CM | POA: Insufficient documentation

## 2019-03-12 LAB — HEPATIC FUNCTION PANEL
ALT: 17 U/L (ref 0–44)
AST: 28 U/L (ref 15–41)
Albumin: 3.7 g/dL (ref 3.5–5.0)
Alkaline Phosphatase: 83 U/L (ref 38–126)
Bilirubin, Direct: <0.1 mg/dL (ref 0.0–0.2)
Total Bilirubin: 0.6 mg/dL (ref 0.3–1.2)
Total Protein: 6.4 g/dL — ABNORMAL LOW (ref 6.5–8.1)

## 2019-03-12 LAB — BASIC METABOLIC PANEL
Anion gap: 13 (ref 5–15)
BUN: 24 mg/dL — ABNORMAL HIGH (ref 8–23)
CO2: 22 mmol/L (ref 22–32)
Calcium: 8.4 mg/dL — ABNORMAL LOW (ref 8.9–10.3)
Chloride: 108 mmol/L (ref 98–111)
Creatinine, Ser: 1.34 mg/dL — ABNORMAL HIGH (ref 0.61–1.24)
GFR calc Af Amer: >60 mL/min (ref >60)
GFR calc non Af Amer: 53 mL/min — ABNORMAL LOW (ref >60)
Glucose, Bld: 94 mg/dL (ref 70–99)
Potassium: 3.6 mmol/L (ref 3.5–5.1)
Sodium: 143 mmol/L (ref 135–145)

## 2019-03-12 LAB — ETHANOL: Alcohol, Ethyl (B): 308 mg/dL (ref <10)

## 2019-03-12 LAB — CBC WITH DIFFERENTIAL/PLATELET
Abs Immature Granulocytes: 0.08 10*3/uL — ABNORMAL HIGH (ref 0.00–0.07)
Basophils Absolute: 0.1 10*3/uL (ref 0.0–0.1)
Basophils Relative: 1 %
Eosinophils Absolute: 0.3 10*3/uL (ref 0.0–0.5)
Eosinophils Relative: 3 %
HCT: 36.9 % — ABNORMAL LOW (ref 39.0–52.0)
Hemoglobin: 10.9 g/dL — ABNORMAL LOW (ref 13.0–17.0)
Immature Granulocytes: 1 %
Lymphocytes Relative: 16 %
Lymphs Abs: 1.5 10*3/uL (ref 0.7–4.0)
MCH: 20.3 pg — ABNORMAL LOW (ref 26.0–34.0)
MCHC: 29.5 g/dL — ABNORMAL LOW (ref 30.0–36.0)
MCV: 68.8 fL — ABNORMAL LOW (ref 80.0–100.0)
Monocytes Absolute: 0.8 10*3/uL (ref 0.1–1.0)
Monocytes Relative: 9 %
Neutro Abs: 6.6 10*3/uL (ref 1.7–7.7)
Neutrophils Relative %: 70 %
Platelets: 218 10*3/uL (ref 150–400)
RBC: 5.36 MIL/uL (ref 4.22–5.81)
RDW: 21.1 % — ABNORMAL HIGH (ref 11.5–15.5)
Smear Review: ADEQUATE
WBC: 9.3 10*3/uL (ref 4.0–10.5)
nRBC: 0 % (ref 0.0–0.2)

## 2019-03-12 LAB — SALICYLATE LEVEL: Salicylate Lvl: <7 mg/dL (ref 2.8–30.0)

## 2019-03-12 LAB — ACETAMINOPHEN LEVEL: Acetaminophen (Tylenol), Serum: <10 ug/mL — ABNORMAL LOW (ref 10–30)

## 2019-03-12 MED ORDER — LORAZEPAM 2 MG/ML IJ SOLN
2.0000 mg | Freq: Once | INTRAMUSCULAR | Status: AC
  Administered 2019-03-12: 2 mg via INTRAMUSCULAR
  Filled 2019-03-12: qty 1

## 2019-03-12 MED ORDER — HALOPERIDOL LACTATE 5 MG/ML IJ SOLN
5.0000 mg | Freq: Once | INTRAMUSCULAR | Status: AC
  Administered 2019-03-12: 5 mg via INTRAMUSCULAR
  Filled 2019-03-12: qty 1

## 2019-03-12 NOTE — ED Provider Notes (Signed)
[facility] Emergency Department Provider Note ____________________________________________   First MD Initiated Contact with Patient 03/12/19 2041     (approximate)  I have reviewed the triage vital signs and the nursing notes.   HISTORY  Chief Complaint Medical Clearance (Patient yelling and out of control poss. ETOH)  Level 5 caveat: History present illness limited due to alcohol intoxication and uncooperative behavior  HPI Omar Davis is a 68 y.o. male with PMH as noted below including depression and alcohol abuse who presents after he was found by police apparently intoxicated and behaving aggressively.  In the ED, the patient is intermittently screaming and although his speech is somewhat slurred he is making some threatening gestures.  He is unable to give any other history.  Past Medical History:  Diagnosis Date  . Chronic pain   . Depressed   . Hypertension   . PTSD (post-traumatic stress disorder)   . TBI (traumatic brain injury) [facility])     Patient Active Problem List   Diagnosis Date Noted  . Sepsis ([place]) 02/08/2019  . Essential hypertension 02/05/2019  . Alcohol-induced mood disorder ([place]) 01/24/2019  . Alcohol dependence with intoxication, uncomplicated ([place]) [id number]  . Major depressive disorder, recurrent severe without psychotic features ([place]) 08/24/2018  . Suicidal ideation 09/25/2015  . Alcohol abuse 09/09/2015  . PTSD (post-traumatic stress disorder) 09/09/2015  . Agitation 09/09/2015  . Depressed   . Chronic pain     Past Surgical History:  Procedure Laterality Date  . BACK SURGERY    . HERNIA REPAIR    . KNEE SURGERY    . KNEE SURGERY    . prostectomy N/A     Prior to Admission medications   Medication Sig Start Date End Date Taking? Authorizing Provider  divalproex (DEPAKOTE ER) 500 MG 24 hr tablet Take 2 tablets (1,000 mg total) by mouth 2 (two) times daily. Patient not taking: Reported on 03/03/2019  02/07/19   Clapacs, Madie Reno, MD  DULoxetine (CYMBALTA) 30 MG capsule Take 1 capsule (30 mg total) by mouth daily. Patient not taking: Reported on 03/03/2019 02/07/19   Clapacs, Madie Reno, MD  lisinopril (ZESTRIL) 10 MG tablet Take 1 tablet (10 mg total) by mouth daily. Patient not taking: Reported on 03/03/2019 02/07/19   Clapacs, Madie Reno, MD  naproxen (NAPROSYN) 500 MG tablet Take 1 tablet (500 mg total) by mouth 2 (two) times daily as needed for moderate pain. Patient not taking: Reported on 03/03/2019 02/07/19   Clapacs, Madie Reno, MD  pravastatin (PRAVACHOL) 80 MG tablet Take 1 tablet (80 mg total) by mouth at bedtime. Patient not taking: Reported on 03/03/2019 02/07/19   Clapacs, Madie Reno, MD  thiamine 100 MG tablet Take 1 tablet (100 mg total) by mouth daily. Patient not taking: Reported on 03/03/2019 02/07/19   Clapacs, Madie Reno, MD    Allergies Codeine, Gabapentin, Penicillins, and Lisinopril  No family history on file.  Social History Social History   Tobacco Use  . Smoking status: Never Smoker  . Smokeless tobacco: Never Used  Substance Use Topics  . Alcohol use: Yes    Comment: 1 pint of vodka  . Drug use: Not Currently    Review of Systems Level 5 caveat: Unable to obtain review of systems due to alcohol intoxication and uncooperative behavior    ____________________________________________   PHYSICAL EXAM:  VITAL SIGNS: ED Triage Vitals [03/12/19 2106]  Enc Vitals Group     BP      Pulse Rate Marland Kitchen)  101     Resp 20     Temp      Temp src      SpO2      Weight      Height      Head Circumference      Peak Flow      Pain Score      Pain Loc      Pain Edu?      Excl. in [place]?     Constitutional: Alert, intoxicated appearing.  Very agitated. Eyes: Conjunctivae are normal.  EOMI.  PERRLA. Head: Atraumatic. Nose: No congestion/rhinnorhea. Mouth/Throat: Mucous membranes are moist.   Neck: Normal range of motion.  Cardiovascular: Tachycardic, regular rhythm. Good  peripheral circulation. Respiratory: Normal respiratory effort.  No retractions.  Gastrointestinal: No distention.  Musculoskeletal:  Extremities warm and well perfused.  Neurologic: Slurred speech.  Motor intact in all extremities. Skin:  Skin is warm and dry. No rash noted. Psychiatric: Agitated, combative.  ____________________________________________   LABS (all labs ordered are listed, but only abnormal results are displayed)  Labs Reviewed  ETHANOL - Abnormal; Notable for the following components:      Result Value   Alcohol, Ethyl (B) 308 (*)    All other components within normal limits  CBC WITH DIFFERENTIAL/PLATELET - Abnormal; Notable for the following components:   Hemoglobin 10.9 (*)    HCT 36.9 (*)    MCV 68.8 (*)    MCH 20.3 (*)    MCHC 29.5 (*)    RDW 21.1 (*)    Abs Immature Granulocytes 0.08 (*)    All other components within normal limits  BASIC METABOLIC PANEL  HEPATIC FUNCTION PANEL  ACETAMINOPHEN LEVEL  SALICYLATE LEVEL  URINE DRUG SCREEN, QUALITATIVE ([facility] ONLY)  URINALYSIS, COMPLETE ([facility]) WITH MICROSCOPIC  COMPREHENSIVE METABOLIC PANEL  ETHANOL  CBC  URINE DRUG SCREEN, QUALITATIVE ([facility] ONLY)   ____________________________________________  EKG   ____________________________________________  RADIOLOGY    ____________________________________________   PROCEDURES  Procedure(s) performed: No  Procedures  Critical Care performed: No ____________________________________________   INITIAL IMPRESSION / ASSESSMENT AND PLAN / ED COURSE  Pertinent labs & imaging results that were available during my care of the patient were reviewed by me and considered in my medical decision making (see chart for details).  68 year old male with PMH as noted above presents with alcohol intoxication and aggressive behavior.  The patient was placed under involuntary commitment by the police after he was found intoxicated and behaving in an incoherent  way.  In the ED, the patient is persistently screaming and is unable to be verbally redirected.  He made threatening gestures when I approached him to evaluate and talk to him.  He appears very angry and labile.  At this time, the patient demonstrates acute danger to self and others, and does not demonstrate decision-making capacity to decline care.  He required sedation due to his acute agitation, and to ensure his safety and that staff.  On physical exam, the vital signs are normal and the patient has no signs of trauma.  He is very intoxicated appearing, but the neurologic exam is nonfocal.  We will obtain labs for medical clearance, observe for sobriety, and then he will need psychiatry evaluation.  ----------------------------------------- 11:00 PM on 03/12/2019 -----------------------------------------  Patient is now sleeping comfortably.  Lab work-up is pending.  I am signing the patient out to the oncoming physician Dr. Alfred Levins. ____________________________________________   FINAL CLINICAL IMPRESSION(S) / ED DIAGNOSES  Final diagnoses:  Alcohol  intoxication with delirium ([place])      NEW MEDICATIONS STARTED DURING THIS VISIT:  New Prescriptions   No medications on file     Note:  This document was prepared using Dragon voice recognition software and may include unintentional dictation errors.    Arta Silence, MD 03/12/19 [phone]

## 2019-03-12 NOTE — ED Provider Notes (Signed)
-----------------------------------------   1:46 PM on 03/12/2019 -----------------------------------------  Blood pressure (!) 149/94, pulse 89, temperature 98.9 F (37.2 C), temperature source Oral, resp. rate 17, height 5\' 8"  (1.727 m), weight 74.8 kg, SpO2 97 %.  The patient is calm and cooperative at this time.  There have been no acute events since the last update.  Awaiting disposition plan from Behavioral Medicine team.  Patient now awake and alert, reevaluated by psychiatry and deemed to not be a threat to himself or others.  IVC was rescinded by psychiatry and recommendation made for discharge home with outpatient follow-up.  Counseled patient return to the ED for new or worsening symptoms, patient agrees with plan.   Blake Divine, MD 03/12/19 1346

## 2019-03-12 NOTE — ED Notes (Signed)
BEHAVIORAL HEALTH ROUNDING Patient sleeping: No. Patient alert and oriented: yes Behavior appropriate: Yes.  ; If no, describe:  Nutrition and fluids offered: yes Toileting and hygiene offered: Yes  Sitter present: q15 minute observations  

## 2019-03-12 NOTE — ED Provider Notes (Signed)
[facility] Emergency Department Provider Note  ____________________________________________   First MD Initiated Contact with Patient 03/11/19 1926     (approximate)  I have reviewed the triage vital signs and the nursing notes.  History  Chief Complaint Suicidal    HPI Omar Davis is a 68 y.o. male with a long history of alcohol abuse and alcohol-related suicidal ideation, who presents under IVC for intoxication and suicidal ideation.  On arrival, patient is significantly intoxicated and shouting "I want to die."   Past Medical Hx Past Medical History:  Diagnosis Date  . Chronic pain   . Depressed   . Hypertension   . PTSD (post-traumatic stress disorder)   . TBI (traumatic brain injury) [facility])     Problem List Patient Active Problem List   Diagnosis Date Noted  . Sepsis ([place]) 02/08/2019  . Essential hypertension 02/05/2019  . Alcohol-induced mood disorder ([place]) 01/24/2019  . Alcohol dependence with intoxication, uncomplicated ([place]) [id number]  . Major depressive disorder, recurrent severe without psychotic features ([place]) 08/24/2018  . Suicidal ideation 09/25/2015  . Alcohol abuse 09/09/2015  . PTSD (post-traumatic stress disorder) 09/09/2015  . Agitation 09/09/2015  . Depressed   . Chronic pain     Past Surgical Hx Past Surgical History:  Procedure Laterality Date  . BACK SURGERY    . HERNIA REPAIR    . KNEE SURGERY    . KNEE SURGERY    . prostectomy N/A     Medications Prior to Admission medications   Medication Sig Start Date End Date Taking? Authorizing Provider  divalproex (DEPAKOTE ER) 500 MG 24 hr tablet Take 2 tablets (1,000 mg total) by mouth 2 (two) times daily. Patient not taking: Reported on 03/03/2019 02/07/19   Clapacs, Madie [place], MD  DULoxetine (CYMBALTA) 30 MG capsule Take 1 capsule (30 mg total) by mouth daily. Patient not taking: Reported on 03/03/2019 02/07/19   Clapacs, Madie Reno, MD  lisinopril (ZESTRIL) 10  MG tablet Take 1 tablet (10 mg total) by mouth daily. Patient not taking: Reported on 03/03/2019 02/07/19   Clapacs, Madie Reno, MD  naproxen (NAPROSYN) 500 MG tablet Take 1 tablet (500 mg total) by mouth 2 (two) times daily as needed for moderate pain. Patient not taking: Reported on 03/03/2019 02/07/19   Clapacs, Madie Reno, MD  pravastatin (PRAVACHOL) 80 MG tablet Take 1 tablet (80 mg total) by mouth at bedtime. Patient not taking: Reported on 03/03/2019 02/07/19   Clapacs, Madie Reno, MD  thiamine 100 MG tablet Take 1 tablet (100 mg total) by mouth daily. Patient not taking: Reported on 03/03/2019 02/07/19   Clapacs, Madie Reno, MD    Allergies Codeine, Gabapentin, Penicillins, and Lisinopril  Family Hx No family history on file.  Social Hx Social History   Tobacco Use  . Smoking status: Never Smoker  . Smokeless tobacco: Never Used  Substance Use Topics  . Alcohol use: Yes    Comment: 1 pint of vodka  . Drug use: Not Currently     Review of Systems  Constitutional: Negative for fever, chills. Eyes: Negative for visual changes. ENT: Negative for sore throat. Cardiovascular: Negative for chest pain. Respiratory: Negative for shortness of breath. Gastrointestinal: Negative for nausea, vomiting.  Genitourinary: Negative for dysuria. Musculoskeletal: Negative for leg swelling. Skin: Negative for rash. Neurological: Negative for for headaches.   Physical Exam  Vital Signs: ED Triage Vitals  Enc Vitals Group     BP 03/11/19 1929 (!) 149/94  Pulse Rate 03/11/19 1929 89     Resp 03/11/19 1929 17     Temp 03/11/19 1929 98.9 F (37.2 C)     Temp Source 03/11/19 1929 Oral     SpO2 03/11/19 1929 97 %     Weight 03/11/19 1931 165 lb (74.8 kg)     Height 03/11/19 1931 5\' 8"  (1.727 m)     Head Circumference --      Peak Flow --      Pain Score 03/11/19 2009 0     Pain Loc --      Pain Edu? --      Excl. in [place]? --     Constitutional: Awake, visibly intoxicated and belligerent.  Head: Normocephalic. Atraumatic. Eyes: Conjunctivae clear. Sclera anicteric. Nose: No epistaxis. Mouth/Throat: Mucous membranes are moist.  Neck: No stridor.   Cardiovascular: Normal rate. Extremities well perfused. Respiratory: Normal respiratory effort.   Gastrointestinal: Non-distended.  Musculoskeletal: No deformities. Neurologic: Ambulatory to the restroom.  No gross focal neurologic deficits are appreciated.  Skin: Skin is warm, dry and intact.  Psychiatric: Visibly intoxicated and belligerent.  Yelling that he wants to die.  EKG  N/A    Radiology  N/A   Procedures  Procedure(s) performed (including critical care):  Procedures   Initial Impression / Assessment and Plan / ED Course  68 y.o. male who presents to the ED for alcohol intoxication and suicidal ideation as above.  Arrives under IVC by PD.  Suspect patient's presentation is related to their known psychiatric diagnosis and related alcohol abuse. Will obtain basic screening labs, continue to monitor for improved sobriety, and consult psychiatry.  Unfortunately, patient arrives and continues to be screaming and belligerent, will administer a dose of medication for patient and staff safety.    Final Clinical Impression(s) / ED Diagnosis  Final diagnoses:  Suicidal ideation  Alcohol abuse  Involuntary commitment       Note:  This document was prepared using Dragon voice recognition software and may include unintentional dictation errors.   Lilia Pro., MD 03/12/19 [phone]

## 2019-03-12 NOTE — ED Notes (Signed)
Hourly rounding reveals patient in room. No complaints, stable, in no acute distress. Q15 minute rounds and monitoring via Rover and Officer to continue.   

## 2019-03-12 NOTE — ED Notes (Signed)

## 2019-03-12 NOTE — ED Notes (Signed)
This tech obtained blood draw and pt was changed into behavorial shirt, pt pants remained on, RN notified

## 2019-03-12 NOTE — ED Notes (Signed)
Hourly rounding reveals patient sleeping in room. No complaints, stable, in no acute distress. Q15 minute rounds and monitoring via Rover and Officer to continue.  

## 2019-03-12 NOTE — ED Triage Notes (Signed)
Patient here via [employer] for ETOH aggressive yelling out of control.

## 2019-03-12 NOTE — ED Notes (Signed)
He is awake - ambulating in his room - pt stating  "I am ready to go  - my girlfriend is blind - can I call and check on her?  Phone provided  Pt verbalizes a desire to go to the [place] for hospitalization

## 2019-03-12 NOTE — ED Notes (Signed)
Unable to obtain full set of vitals d/t pt being uncooperative. Pt was medicated upon arrival and is starting to settle down, will attempt vitals shortly. RN Dominica Severin notified

## 2019-03-12 NOTE — ED Notes (Signed)
ED  Is the patient under IVC or is there intent for IVC: Yes.   Is the patient medically cleared: Yes.   Is there vacancy in the ED BHU: Yes.   Is the population mix appropriate for patient: Yes.   Is the patient awaiting placement in inpatient or outpatient setting:  Has the patient had a psychiatric consult: Yes.   - reassessment completed  Pt to discharge  Survey of unit performed for contraband, proper placement and condition of furniture, tampering with fixtures in bathroom, shower, and each patient room: Yes.  ; Findings:  APPEARANCE/BEHAVIOR Calm and cooperative NEURO ASSESSMENT Orientation: oriented x3  Denies pain Hallucinations: No.None noted (Hallucinations) denies  Speech: Normal Gait: normal RESPIRATORY ASSESSMENT Even  Unlabored respirations  CARDIOVASCULAR ASSESSMENT Pulses equal   regular rate  Skin warm and dry   GASTROINTESTINAL ASSESSMENT no GI complaint EXTREMITIES Full ROM  PLAN OF CARE Provide calm/safe environment. Vital signs assessed twice daily. ED BHU Assessment once each 12-hour shift. Collaborate with TTS daily or as condition indicates. Assure the ED provider has rounded once each shift. Provide and encourage hygiene. Provide redirection as needed. Assess for escalating behavior; address immediately and inform ED provider.  Assess family dynamic and appropriateness for visitation as needed: Yes.  ; If necessary, describe findings:  Educate the patient/family about BHU procedures/visitation: Yes.  ; If necessary, describe findings:

## 2019-03-12 NOTE — ED Notes (Signed)
IVC PAPERS  RESCINDED  INFORMED  RN  AMY  TEAGUE

## 2019-03-12 NOTE — ED Notes (Signed)
BEHAVIORAL HEALTH ROUNDING Patient sleeping: No. Patient alert and oriented: yes Behavior appropriate: Yes.  ; If no, describe:  Nutrition and fluids offered: yes Toileting and hygiene offered: Yes  Sitter present: q15 minute observations Law enforcement present: Yes  ACSD  

## 2019-03-13 DIAGNOSIS — F10921 Alcohol use, unspecified with intoxication delirium: Secondary | ICD-10-CM | POA: Insufficient documentation

## 2019-03-13 LAB — URINALYSIS, COMPLETE (UACMP) WITH MICROSCOPIC
Bacteria, UA: NONE SEEN
Bilirubin Urine: NEGATIVE
Glucose, UA: NEGATIVE mg/dL
Hgb urine dipstick: NEGATIVE
Ketones, ur: NEGATIVE mg/dL
Leukocytes,Ua: NEGATIVE
Nitrite: NEGATIVE
Protein, ur: NEGATIVE mg/dL
Specific Gravity, Urine: 1.012 (ref 1.005–1.030)
pH: 5 (ref 5.0–8.0)

## 2019-03-13 LAB — URINE DRUG SCREEN, QUALITATIVE (ARMC ONLY)
Amphetamines, Ur Screen: NOT DETECTED
Barbiturates, Ur Screen: NOT DETECTED
Benzodiazepine, Ur Scrn: NOT DETECTED
Cannabinoid 50 Ng, Ur ~~LOC~~: NOT DETECTED
Cocaine Metabolite,Ur ~~LOC~~: NOT DETECTED
MDMA (Ecstasy)Ur Screen: NOT DETECTED
Methadone Scn, Ur: NOT DETECTED
Opiate, Ur Screen: NOT DETECTED
Phencyclidine (PCP) Ur S: NOT DETECTED
Tricyclic, Ur Screen: NOT DETECTED

## 2019-03-13 NOTE — BH Assessment (Signed)
Per the instructions of [facility] medical Director, Dr. Dwyane Dee, writer attempted to refer the patient to [facility] but no available beds.   [place] ([id number] ext. [phone]), currently on [place] (Mohammad-[phone] ext [phone]), Currently on [place] ([place]) Currently on Psych Diversion  [facility] ([phone]), they have a IPRS bed available. Writer was advised to fax patient's information.

## 2019-03-13 NOTE — ED Notes (Signed)
Hourly rounding reveals patient sleeping in room. No complaints, stable, in no acute distress. Q15 minute rounds and monitoring via Rover and Officer to continue.  

## 2019-03-13 NOTE — ED Notes (Signed)
ED  Is the patient under IVC or is there intent for IVC: Yes.   Is the patient medically cleared: Yes.   Is there vacancy in the ED BHU: Yes.   Is the population mix appropriate for patient: Yes.   Is the patient awaiting placement in inpatient or outpatient setting: Yes.  He has been accepted to [facility]    Has the patient had a psychiatric consult: Yes.   Survey of unit performed for contraband, proper placement and condition of furniture, tampering with fixtures in bathroom, shower, and each patient room: Yes.  ; Findings:  APPEARANCE/BEHAVIOR Calm and cooperative NEURO ASSESSMENT Orientation: oriented x3  Denies pain Hallucinations: No.None noted (Hallucinations)  Denies  Speech: Normal Gait: normal RESPIRATORY ASSESSMENT Even  Unlabored respirations  CARDIOVASCULAR ASSESSMENT Pulses equal   regular rate  Skin warm and dry   GASTROINTESTINAL ASSESSMENT no GI complaint EXTREMITIES Full ROM  PLAN OF CARE Provide calm/safe environment. Vital signs assessed twice daily. ED BHU Assessment once each 12-hour shift. Collaborate with TTS daily or as condition indicates. Assure the ED provider has rounded once each shift. Provide and encourage hygiene. Provide redirection as needed. Assess for escalating behavior; address immediately and inform ED provider.  Assess family dynamic and appropriateness for visitation as needed: Yes.  ; If necessary, describe findings:  Educate the patient/family about BHU procedures/visitation: Yes.  ; If necessary, describe findings:

## 2019-03-13 NOTE — BH Assessment (Signed)
Assessment Note  Omar Davis is an 68 y.o. male who presents to the ER for the second time within twenty-four hours, intoxicated voicing SI. Patient is well known to the ER for similar presentation. This is the patient's six ER visit for the month of October 2020. Upon arrival to the ER his BAC was 308.  When patient arrived to the ER he told staff he wanted to die.   When sober, patient is able to walk and complete his ADL's without assistance.  Due to patient's BAC, he was unable to participate in this assessment. Information was collected by notes and ER staff.  Diagnosis: Alcohol Abuse Disorder  Past Medical History:  Past Medical History:  Diagnosis Date  . Chronic pain   . Depressed   . Hypertension   . PTSD (post-traumatic stress disorder)   . TBI (traumatic brain injury) [facility])     Past Surgical History:  Procedure Laterality Date  . BACK SURGERY    . HERNIA REPAIR    . KNEE SURGERY    . KNEE SURGERY    . prostectomy N/A     Family History: No family history on file.  Social History:  reports that he has never smoked. He has never used smokeless tobacco. He reports current alcohol use. He reports previous drug use.  Additional Social History:  Alcohol / Drug Use Pain Medications: See PTA Prescriptions: See PTA Over the Counter: See PTA History of alcohol / drug use?: Yes Longest period of sobriety (when/how long): Unable to quantify Negative Consequences of Use: Personal relationships, Financial Substance #1 Name of Substance 1: Alcohol 1 - Age of First Use: Unable to quantify 1 - Amount (size/oz): Unable to quantify 1 - Frequency: Daily 1 - Duration: Unable to quantify 1 - Last Use / Amount: 03/12/2019  CIWA: CIWA-Ar BP: (!) 100/58 Pulse Rate: 77 Nausea and Vomiting: no nausea and no vomiting Tactile Disturbances: none Tremor: no tremor Auditory Disturbances: not present Paroxysmal Sweats: no sweat visible Visual Disturbances: not  present Anxiety: no anxiety, at ease Headache, Fullness in Head: none present Agitation: normal activity Orientation and Clouding of Sensorium: (sleeping) COWS:    Allergies:  Allergies  Allergen Reactions  . Codeine Hives and Rash  . Gabapentin Other (See Comments) and Anaphylaxis    seizures Other reaction(s): Other (See Comments) seizures Anaphylactic Shock   . Penicillins Rash    Other reaction(s): Other (See Comments) Other Reaction: Not Assessed Rash   . Lisinopril Swelling    Pt reports lip swelling    Home Medications: (Not in a hospital admission)   OB/GYN Status:  No LMP for male patient.  General Assessment Data Location of Assessment: [facility] ED TTS Assessment: In system Is this a Tele or Face-to-Face Assessment?: Face-to-Face Is this an Initial Assessment or a Re-assessment for this encounter?: Initial Assessment Patient Accompanied by:: N/A Language Other than English: No Living Arrangements: Other (Comment)(Private Home) What gender do you identify as?: Male Marital status: Single Pregnancy Status: No Living Arrangements: Alone Can pt return to current living arrangement?: Yes Admission Status: Involuntary Petitioner: Police Is patient capable of signing voluntary admission?: No(Under IVC) Referral Source: Self/Family/Friend Insurance type: VA Tax adviser Exam ([place]) Medical Exam completed: Yes  Crisis Care Plan Living Arrangements: Alone Legal Guardian: Other:(Self) Name of Psychiatrist: [place] [place] Name of Therapist: none  Education Status Is patient currently in school?: No Highest grade of school patient has completed: Pt reports "year and  1/2 of tech school". Is the patient employed, unemployed or receiving disability?: Receiving disability income  Risk to self with the past 6 months Suicidal Ideation: Yes-Currently Present Has patient been a risk to self within the past 6 months prior to  admission? : No Suicidal Intent: No Has patient had any suicidal intent within the past 6 months prior to admission? : No Is patient at risk for suicide?: No Suicidal Plan?: No Has patient had any suicidal plan within the past 6 months prior to admission? : No Access to Means: No What has been your use of drugs/alcohol within the last 12 months?: Alcohol Abuse Previous Attempts/Gestures: No How many times?: 0 Other Self Harm Risks: Alcohol Abuse Triggers for Past Attempts: None known Intentional Self Injurious Behavior: None Family Suicide History: No Recent stressful life event(s): Other (Comment) Persecutory voices/beliefs?: No Depression: Yes Depression Symptoms: Tearfulness, Feeling worthless/self pity Substance abuse history and/or treatment for substance abuse?: Yes Suicide prevention information given to non-admitted patients: Not applicable  Risk to Others within the past 6 months Homicidal Ideation: No Does patient have any lifetime risk of violence toward others beyond the six months prior to admission? : No Thoughts of Harm to Others: No Current Homicidal Intent: No Current Homicidal Plan: No Access to Homicidal Means: No Identified Victim: Reports of none History of harm to others?: No Assessment of Violence: None Noted Violent Behavior Description: Reports of none Does patient have access to weapons?: No Criminal Charges Pending?: No Does patient have a court date: No Is patient on probation?: No  Psychosis Hallucinations: None noted Delusions: None noted  Mental Status Report Appearance/Hygiene: Unremarkable, In scrubs Eye Contact: Poor Motor Activity: Unsteady Speech: Logical/coherent, Unremarkable Level of Consciousness: Drowsy Mood: Depressed, Anxious Affect: Appropriate to circumstance Anxiety Level: Moderate Thought Processes: Flight of Ideas, Irrelevant Judgement: Impaired Orientation: Person, Place, Time, Situation, Appropriate for  developmental age Obsessive Compulsive Thoughts/Behaviors: None  Cognitive Functioning Concentration: Decreased Memory: Recent Impaired, Remote Impaired Is patient IDD: No Insight: Poor Impulse Control: Fair Appetite: Good Have you had any weight changes? : No Change Sleep: No Change Total Hours of Sleep: 7 Vegetative Symptoms: None  ADLScreening [facility] Assessment Services) Patient's cognitive ability adequate to safely complete daily activities?: Yes Patient able to express need for assistance with ADLs?: Yes Independently performs ADLs?: Yes (appropriate for developmental age)  Prior Inpatient Therapy Prior Inpatient Therapy: Yes Prior Therapy Dates: 2020 Prior Therapy Facilty/Provider(s): [facility]  Reason for Treatment: depression   Prior Outpatient Therapy Prior Outpatient Therapy: Yes Prior Therapy Dates: Current  Prior Therapy Facilty/Provider(s): [facility] Reason for Treatment: Depression  Does patient have an ACCT team?: No Does patient have Intensive In-House Services?  : No Does patient have [facility] services? : No Does patient have P4CC services?: No  ADL Screening (condition at time of admission) Patient's cognitive ability adequate to safely complete daily activities?: Yes Is the patient deaf or have difficulty hearing?: No Does the patient have difficulty seeing, even when wearing glasses/contacts?: No Does the patient have difficulty concentrating, remembering, or making decisions?: No Patient able to express need for assistance with ADLs?: Yes Does the patient have difficulty dressing or bathing?: No Independently performs ADLs?: Yes (appropriate for developmental age) Does the patient have difficulty walking or climbing stairs?: No Weakness of Legs: None Weakness of Arms/Hands: None     Therapy Consults (therapy consults require a physician order) PT Evaluation Needed: No OT Evalulation Needed: No SLP Evaluation Needed: No Abuse/Neglect  Assessment (Assessment to  be complete while patient is alone) Abuse/Neglect Assessment Can Be Completed: Yes Physical Abuse: Denies Verbal Abuse: Denies Sexual Abuse: Denies Exploitation of patient/patient's resources: Denies Self-Neglect: Denies Values / Beliefs Cultural Requests During Hospitalization: None Spiritual Requests During Hospitalization: None Consults Spiritual Care Consult Needed: No Social Work Consult Needed: No         Child/Adolescent Assessment Running Away Risk: Denies(Patient is an adult)  Disposition:     On Site Evaluation by:   Reviewed with Physician:    Gunnar Fusi MS, LCAS, [facility], [place] Therapeutic Triage Specialist 03/13/2019 4:17 AM

## 2019-03-13 NOTE — ED Notes (Signed)
BEHAVIORAL HEALTH ROUNDING Patient sleeping: No. Patient alert and oriented: yes Behavior appropriate: Yes.  ; If no, describe:  Nutrition and fluids offered: yes Toileting and hygiene offered: Yes  Sitter present: q15 minute observations  Law enforcement present: Yes  [employer]   ENVIRONMENTAL ASSESSMENT Potentially harmful objects out of patient reach: Yes.   Personal belongings secured: Yes.   Patient dressed in hospital provided attire only: Yes.   Plastic bags out of patient reach: Yes.   Patient care equipment (cords, cables, call bells, lines, and drains) shortened, removed, or accounted for: Yes.   Equipment and supplies removed from bottom of stretcher: Yes.   Potentially toxic materials out of patient reach: Yes.   Sharps container removed or out of patient reach: Yes.   

## 2019-03-13 NOTE — ED Notes (Signed)
Patient observed lying in bed with eyes closed  Even, unlabored respirations observed   NAD pt appears to be sleeping  I will continue to monitor along with every 15 minute visual observations     

## 2019-03-13 NOTE — Consult Note (Signed)
Omar Davis   Reason for Davis:Medical Clearance (Patient yelling and out of control poss. ETOH) Referring Physician: Dr.Siadecki Patient Identification: Omar Davis MRN:  [id number] Principal Diagnosis: Alcohol abuse,  Alcohol dependence with intoxication, uncomplicated ([place]) Alcohol-induced mood disorder ([place]) Chronic pain Diagnosis: Agitation. Depressed None essential hypertension Major depressive disorder, recurrent severe without psychotic features ([place]) PTSD (posttraumatic stress disorder) Sepsis ([place]) Suicidal ideation  Total Time spent with patient: 30 minutes  Subjective: "No I do not want to hurt myself. I say that when I drink" Omar Davis is a 68 y.o. male patient presented to [facility] ED via law enforcement under involuntary commitment status (IVC). The patient is a veteran, and during this visit, TTS counselor Mr. Tammi Klippel will attempt to get the patient into the [place] hospital's custody.  During the initial patient assessment, he is voiced that he was suicidal. The patient's ETOH level is 308 mg/dl.  Per the patient assessment by this provider, he denies suicidal ideation and self-injurious behavior. The patient was seen face-to-face by this provider; chart reviewed and consulted with Dr. Cherylann Banas on 03/12/2019 due to the patient's care. It was discussed with the EDP that the patient does meet the criteria to be referred to as psychiatric inpatient admission. The patient can be transferee into [place] care when he is sober per Dr. Dwyane Dee.  This behavior is the continuous behavior of the patient. The patient is alert and oriented x3 but very intoxicated; he is calm, cooperative, and mood-congruent with an affect on evaluation. The patient does not appear to be responding to internal or external stimuli. Neither is the patient presenting with any delusional thinking. The patient denies auditory or visual hallucinations. The patient denies any suicidal,  homicidal, or self-harm ideations. The patient is not presenting with any psychotic or paranoid behaviors. During an encounter with the patient, he was able to answer questions appropriately.   Plan: The patient is a safety risk to himself. The patient will be observed overnight, and TTS counselor Mr. Tammi Klippel will get the patient into the [place] custody.   HPI: Per Siadecki; Omar Davis is a 68 y.o. male with PMH as noted below including depression and alcohol abuse who presents after he was found by police apparently intoxicated and behaving aggressively.  In the ED, the patient is intermittently screaming and although his speech is somewhat slurred he is making some threatening gestures.  He is unable to give any other history.   Records reviewed. Per medical record review patient has lots of visit to emergency department due to him being intoxicated, belligerent, verbally abusive who comes in contact with him and unsafe while he is intoxicated.  Past Psychiatric History :  Chronic pain Depressed PTSD (post-traumatic stress disorder) TBI (traumatic brain injury) ([place])   Risk to Self:  No Risk to Others:  No Prior Inpatient Therapy:  Yes Prior Outpatient Therapy:  Yes  Past Medical History:  Past Medical History:  Diagnosis Date  . Chronic pain   . Depressed   . Hypertension   . PTSD (post-traumatic stress disorder)   . TBI (traumatic brain injury) [facility])     Past Surgical History:  Procedure Laterality Date  . BACK SURGERY    . HERNIA REPAIR    . KNEE SURGERY    . KNEE SURGERY    . prostectomy N/A    Family History: No family history on file. Family Psychiatric  History: None provided Social History:  Social History   Substance and  Sexual Activity  Alcohol Use Yes   Comment: 1 pint of vodka     Social History   Substance and Sexual Activity  Drug Use Not Currently    Social History   Socioeconomic History  . Marital status: Legally Separated    Spouse name:  Not on file  . Number of children: Not on file  . Years of education: Not on file  . Highest education level: Not on file  Occupational History  . Not on file  Social Needs  . Financial resource strain: Not on file  . Food insecurity    Worry: Not on file    Inability: Not on file  . Transportation needs    Medical: Not on file    Non-medical: Not on file  Tobacco Use  . Smoking status: Never Smoker  . Smokeless tobacco: Never Used  Substance and Sexual Activity  . Alcohol use: Yes    Comment: 1 pint of vodka  . Drug use: Not Currently  . Sexual activity: Not Currently  Lifestyle  . Physical activity    Days per week: Not on file    Minutes per session: Not on file  . Stress: Not on file  Relationships  . Social Herbalist on phone: Not on file    Gets together: Not on file    Attends religious service: Not on file    Active member of club or organization: Not on file    Attends meetings of clubs or organizations: Not on file    Relationship status: Not on file  Other Topics Concern  . Not on file  Social History Narrative  . Not on file   Additional Social History:    Allergies:   Allergies  Allergen Reactions  . Codeine Hives and Rash  . Gabapentin Other (See Comments) and Anaphylaxis    seizures Other reaction(s): Other (See Comments) seizures Anaphylactic Shock   . Penicillins Rash    Other reaction(s): Other (See Comments) Other Reaction: Not Assessed Rash   . Lisinopril Swelling    Pt reports lip swelling    Labs:  Results for orders placed or performed during the hospital encounter of 03/12/19 (from the past 48 hour(s))  Basic metabolic panel     Status: Abnormal   Collection Time: 03/12/19  9:52 PM  Result Value Ref Range   Sodium 143 135 - 145 mmol/L   Potassium 3.6 3.5 - 5.1 mmol/L   Chloride 108 98 - 111 mmol/L   CO2 22 22 - 32 mmol/L   Glucose, Bld 94 70 - 99 mg/dL   BUN 24 (H) 8 - 23 mg/dL   Creatinine, Ser 1.34 (H) 0.61  - 1.24 mg/dL   Calcium 8.4 (L) 8.9 - 10.3 mg/dL   GFR calc non Af Amer 53 (L) >60 mL/min   GFR calc Af Amer >60 >60 mL/min   Anion gap 13 5 - 15    Comment: Performed at [facility], [address] [place], [place] [address]  Hepatic function panel     Status: Abnormal   Collection Time: 03/12/19  9:52 PM  Result Value Ref Range   Total Protein 6.4 (L) 6.5 - 8.1 g/dL   Albumin 3.7 3.5 - 5.0 g/dL   AST 28 15 - 41 U/L   ALT 17 0 - 44 U/L   Alkaline Phosphatase 83 38 - 126 U/L   Total Bilirubin 0.6 0.3 - 1.2 mg/dL   Bilirubin, Direct <0.1  0.0 - 0.2 mg/dL   Indirect Bilirubin NOT CALCULATED 0.3 - 0.9 mg/dL    Comment: Performed at [facility], [address] [place], [place] [address]  Ethanol     Status: Abnormal   Collection Time: 03/12/19  9:52 PM  Result Value Ref Range   Alcohol, Ethyl (B) 308 (HH) <10 mg/dL    Comment: CRITICAL RESULT CALLED TO, READ BACK BY AND VERIFIED WITH SHANNON MARTIN AT 2254 ON 03/12/19 RWW (NOTE) Lowest detectable limit for serum alcohol is 10 mg/dL. For medical purposes only. Performed at [facility], [address] [place], [place] [address]   Acetaminophen level     Status: Abnormal   Collection Time: 03/12/19  9:52 PM  Result Value Ref Range   Acetaminophen (Tylenol), Serum <10 (L) 10 - 30 ug/mL    Comment: (NOTE) Therapeutic concentrations vary significantly. A range of 10-30 ug/mL  may be an effective concentration for many patients. However, some  are best treated at concentrations outside of this range. Acetaminophen concentrations >150 ug/mL at 4 hours after ingestion  and >50 ug/mL at 12 hours after ingestion are often associated with  toxic reactions. Performed at [facility], [address] [place], [place] [id number]   Salicylate level     Status: None   Collection Time: 03/12/19  9:52 PM  Result Value Ref Range   Salicylate Lvl [id number] 2.8 - 30.0 mg/dL    Comment: Performed at [facility], [address] [place], [place] [address]  CBC with Differential     Status: Abnormal   Collection Time: 03/12/19  9:52 PM  Result Value Ref Range   WBC 9.3 4.0 - 10.5 K/uL   RBC 5.36 4.22 - 5.81 MIL/uL   Hemoglobin 10.9 (L) 13.0 - 17.0 g/dL   HCT 36.9 (L) 39.0 - 52.0 %   MCV 68.8 (L) 80.0 - 100.0 fL   MCH 20.3 (L) 26.0 - 34.0 pg   MCHC 29.5 (L) 30.0 - 36.0 g/dL   RDW 21.1 (H) 11.5 - 15.5 %   Platelets 218 150 - 400 K/uL   nRBC 0.0 0.0 - 0.2 %   Neutrophils Relative % 70 %   Neutro Abs 6.6 1.7 - 7.7 K/uL   Lymphocytes Relative 16 %   Lymphs Abs 1.5 0.7 - 4.0 K/uL   Monocytes Relative 9 %   Monocytes Absolute 0.8 0.1 - 1.0 K/uL   Eosinophils Relative 3 %   Eosinophils Absolute 0.3 0.0 - 0.5 K/uL   Basophils Relative 1 %   Basophils Absolute 0.1 0.0 - 0.1 K/uL   WBC Morphology MORPHOLOGY UNREMARKABLE    Smear Review PLATELETS APPEAR ADEQUATE    Immature Granulocytes 1 %   Abs Immature Granulocytes 0.08 (H) 0.00 - 0.07 K/uL   Tear Drop Cells PRESENT    Polychromasia PRESENT    Ovalocytes PRESENT     Comment: Performed at [facility], [address] [place], [place] [address]    No current facility-administered medications for this encounter.    Current Outpatient Medications  Medication Sig Dispense Refill  . divalproex (DEPAKOTE ER) 500 MG 24 hr tablet Take 2 tablets (1,000 mg total) by mouth 2 (two) times daily. (Patient not taking: Reported on 03/03/2019) 120 tablet 0  . DULoxetine (CYMBALTA) 30 MG capsule Take 1 capsule (30 mg total) by mouth daily. (Patient not taking: Reported on 03/03/2019) 30 capsule 0  . lisinopril (ZESTRIL) 10 MG tablet Take 1 tablet (10  mg total) by mouth daily. (Patient not taking: Reported on 03/03/2019) 30 tablet 0  . naproxen (NAPROSYN) 500 MG tablet Take 1 tablet (500 mg total) by mouth 2 (two) times daily as needed for moderate pain. (Patient not taking: Reported on 03/03/2019) 60 tablet 0  . pravastatin (PRAVACHOL)  80 MG tablet Take 1 tablet (80 mg total) by mouth at bedtime. (Patient not taking: Reported on 03/03/2019) 30 tablet 0  . thiamine 100 MG tablet Take 1 tablet (100 mg total) by mouth daily. (Patient not taking: Reported on 03/03/2019) 30 tablet 0    Musculoskeletal: Strength & Muscle Tone: within normal limits Gait & Station: normal Patient leans: N/A  Psychiatric Specialty Exam: Physical Exam  Nursing note and vitals reviewed. Constitutional: He is oriented to person, place, and time. He appears well-developed.  Neck: Normal range of motion. Neck supple.  Respiratory: Effort normal.  Musculoskeletal: Normal range of motion.  Neurological: He is alert and oriented to person, place, and time.    Review of Systems  Psychiatric/Behavioral: Positive for substance abuse. The patient is nervous/anxious.     Blood pressure (!) 100/58, pulse 77, temperature 98.7 F (37.1 C), temperature source Oral, resp. rate 18, SpO2 94 %.There is no height or weight on file to calculate BMI.  General Appearance: Casual  Eye Contact:  Minimal  Speech:  Clear and Coherent and but with mostly yes and no answers.  Volume:  Decreased  Mood:  Depressed  Affect:  Congruent, Depressed and Flat  Thought Process:  Coherent  Orientation:  Full (Time, Place, and Person)  Thought Content:  Logical  Suicidal Thoughts:  No  Homicidal Thoughts:  No  Memory:  Immediate;   Fair Recent;   Fair Remote;   Fair  Judgement:  Poor  Insight:  Lacking  Psychomotor Activity:  Decreased  Concentration:  Concentration: Poor and Attention Span: Poor  Recall:  Poor  Fund of Knowledge:  Poor  Language:  Fair  Akathisia:  Negative  Handed:  Right  AIMS (if indicated):     Assets:  Desire for Improvement Physical Health Social Support  ADL's:  Intact  Cognition:  Impaired,  Mild  Sleep:   Good     Treatment Plan Summary: Medication management and Plan Patient will be observed overnight and discharge in the A.M.  once collateral can be obtained.   Disposition: No evidence of imminent risk to self or others at present.   Patient does not meet criteria for psychiatric inpatient admission. Supportive therapy provided about ongoing stressors.  Caroline Sauger, NP 03/13/2019 2:33 AM

## 2019-03-13 NOTE — BH Assessment (Signed)
Patient has been accepted to [facility].  Patient assigned to the [facility] Accepting physician is Dr. Jonelle Sports.  Call report to [phone].  Representative was Pax.   ER Staff is aware of it:  Tye Maryland, ER Secretary  Dr. Alfred Levins, ER MD  Ozella Almond., Patient's Nurse   Bed available anytime after 10:00am   Address:  [address]  [place], [place] [address]

## 2019-03-22 ENCOUNTER — Other Ambulatory Visit: Payer: Self-pay

## 2019-03-22 ENCOUNTER — Ambulatory Visit
Admission: RE | Admit: 2019-03-22 | Discharge: 2019-03-22 | Disposition: A | Discharge status: Home / Self Care | Payer: Medicare HMO | Source: Ambulatory Visit | Attending: Acute Care | Admitting: Acute Care

## 2019-03-22 DIAGNOSIS — R200 Anesthesia of skin: Secondary | ICD-10-CM | POA: Diagnosis present

## 2019-03-22 DIAGNOSIS — R202 Paresthesia of skin: Secondary | ICD-10-CM | POA: Diagnosis present

## 2019-03-25 ENCOUNTER — Encounter: Payer: Self-pay | Admitting: Emergency Medicine

## 2019-03-25 ENCOUNTER — Emergency Department
Admission: EM | Admit: 2019-03-25 | Discharge: 2019-03-25 | Disposition: A | Discharge status: Home / Self Care | Payer: No Typology Code available for payment source | Attending: Emergency Medicine | Admitting: Emergency Medicine

## 2019-03-25 ENCOUNTER — Other Ambulatory Visit: Payer: Self-pay

## 2019-03-25 DIAGNOSIS — Y906 Blood alcohol level of 120-199 mg/100 ml: Secondary | ICD-10-CM | POA: Insufficient documentation

## 2019-03-25 DIAGNOSIS — I10 Essential (primary) hypertension: Secondary | ICD-10-CM | POA: Diagnosis not present

## 2019-03-25 DIAGNOSIS — R45851 Suicidal ideations: Secondary | ICD-10-CM | POA: Diagnosis not present

## 2019-03-25 DIAGNOSIS — F1010 Alcohol abuse, uncomplicated: Secondary | ICD-10-CM | POA: Insufficient documentation

## 2019-03-25 LAB — URINE DRUG SCREEN, QUALITATIVE (ARMC ONLY)
Amphetamines, Ur Screen: NOT DETECTED
Barbiturates, Ur Screen: NOT DETECTED
Benzodiazepine, Ur Scrn: NOT DETECTED
Cannabinoid 50 Ng, Ur ~~LOC~~: NOT DETECTED
Cocaine Metabolite,Ur ~~LOC~~: NOT DETECTED
MDMA (Ecstasy)Ur Screen: NOT DETECTED
Methadone Scn, Ur: NOT DETECTED
Opiate, Ur Screen: NOT DETECTED
Phencyclidine (PCP) Ur S: NOT DETECTED
Tricyclic, Ur Screen: NOT DETECTED

## 2019-03-25 LAB — CBC
HCT: 41.9 % (ref 39.0–52.0)
Hemoglobin: 12.3 g/dL — ABNORMAL LOW (ref 13.0–17.0)
MCH: 20.6 pg — ABNORMAL LOW (ref 26.0–34.0)
MCHC: 29.4 g/dL — ABNORMAL LOW (ref 30.0–36.0)
MCV: 70.1 fL — ABNORMAL LOW (ref 80.0–100.0)
Platelets: 280 10*3/uL (ref 150–400)
RBC: 5.98 MIL/uL — ABNORMAL HIGH (ref 4.22–5.81)
RDW: 20.5 % — ABNORMAL HIGH (ref 11.5–15.5)
WBC: 7.2 10*3/uL (ref 4.0–10.5)
nRBC: 0 % (ref 0.0–0.2)

## 2019-03-25 LAB — COMPREHENSIVE METABOLIC PANEL
ALT: 18 U/L (ref 0–44)
AST: 23 U/L (ref 15–41)
Albumin: 3.8 g/dL (ref 3.5–5.0)
Alkaline Phosphatase: 66 U/L (ref 38–126)
Anion gap: 11 (ref 5–15)
BUN: 25 mg/dL — ABNORMAL HIGH (ref 8–23)
CO2: 22 mmol/L (ref 22–32)
Calcium: 9.7 mg/dL (ref 8.9–10.3)
Chloride: 107 mmol/L (ref 98–111)
Creatinine, Ser: 1.09 mg/dL (ref 0.61–1.24)
GFR calc Af Amer: >60 mL/min (ref >60)
GFR calc non Af Amer: >60 mL/min (ref >60)
Glucose, Bld: 94 mg/dL (ref 70–99)
Potassium: 4.2 mmol/L (ref 3.5–5.1)
Sodium: 140 mmol/L (ref 135–145)
Total Bilirubin: 0.4 mg/dL (ref 0.3–1.2)
Total Protein: 6.8 g/dL (ref 6.5–8.1)

## 2019-03-25 LAB — ETHANOL: Alcohol, Ethyl (B): 157 mg/dL — ABNORMAL HIGH (ref <10)

## 2019-03-25 NOTE — ED Provider Notes (Signed)
[facility] Emergency Department Provider Note  ____________________________________________  Time seen: Approximately 2:16 PM  I have reviewed the triage vital signs and the nursing notes.   HISTORY  Chief Complaint Suicidal    HPI Omar Davis is a 68 y.o. male with a history of hypertension, chronic pain, PTSD who was brought to the ED voluntarily due to reported suicidal ideation.  The patient is well-known to this emergency department  due to frequent presentations from alcohol use complications.  Currently states that he just got upset, feels fine.  Denies any current SI HI or hallucinations.  He feels calm and safe and wants to leave.  He is eating.     Past Medical History:  Diagnosis Date  . Chronic pain   . Depressed   . Hypertension   . PTSD (post-traumatic stress disorder)   . TBI (traumatic brain injury) [facility])      Patient Active Problem List   Diagnosis Date Noted  . Alcohol intoxication with delirium ([place])   . Sepsis ([place]) 02/08/2019  . Essential hypertension 02/05/2019  . Alcohol-induced mood disorder ([place]) 01/24/2019  . Alcohol dependence with intoxication, uncomplicated ([place]) [id number]  . Major depressive disorder, recurrent severe without psychotic features ([place]) 08/24/2018  . Suicidal ideation 09/25/2015  . Alcohol abuse 09/09/2015  . PTSD (post-traumatic stress disorder) 09/09/2015  . Agitation 09/09/2015  . Depressed   . Chronic pain      Past Surgical History:  Procedure Laterality Date  . BACK SURGERY    . HERNIA REPAIR    . KNEE SURGERY    . KNEE SURGERY    . prostectomy N/A      Prior to Admission medications   Medication Sig Start Date End Date Taking? Authorizing Provider  divalproex (DEPAKOTE ER) 500 MG 24 hr tablet Take 2 tablets (1,000 mg total) by mouth 2 (two) times daily. Patient not taking: Reported on 03/03/2019 02/07/19   Clapacs, Madie Reno, MD  DULoxetine (CYMBALTA) 30 MG capsule Take 1  capsule (30 mg total) by mouth daily. Patient not taking: Reported on 03/03/2019 02/07/19   Clapacs, Madie Reno, MD  lisinopril (ZESTRIL) 10 MG tablet Take 1 tablet (10 mg total) by mouth daily. Patient not taking: Reported on 03/03/2019 02/07/19   Clapacs, Madie Reno, MD  naproxen (NAPROSYN) 500 MG tablet Take 1 tablet (500 mg total) by mouth 2 (two) times daily as needed for moderate pain. Patient not taking: Reported on 03/03/2019 02/07/19   Clapacs, Madie Reno, MD  pravastatin (PRAVACHOL) 80 MG tablet Take 1 tablet (80 mg total) by mouth at bedtime. Patient not taking: Reported on 03/03/2019 02/07/19   Clapacs, Madie Reno, MD  thiamine 100 MG tablet Take 1 tablet (100 mg total) by mouth daily. Patient not taking: Reported on 03/03/2019 02/07/19   Clapacs, Madie Reno, MD     Allergies Codeine, Gabapentin, Penicillins, and Lisinopril   No family history on file.  Social History Social History   Tobacco Use  . Smoking status: Never Smoker  . Smokeless tobacco: Never Used  Substance Use Topics  . Alcohol use: Yes    Comment: 1 pint of vodka  . Drug use: Not Currently    Review of Systems  Constitutional:   No fever or chills.  ENT:   No sore throat. No rhinorrhea. Cardiovascular:   No chest pain or syncope. Respiratory:   No dyspnea or cough. Gastrointestinal:   Negative for abdominal pain, vomiting and diarrhea.  Musculoskeletal:   Negative  for focal pain or swelling All other systems reviewed and are negative except as documented above in ROS and HPI.  ____________________________________________   PHYSICAL EXAM:  VITAL SIGNS: ED Triage Vitals  Enc Vitals Group     BP 03/25/19 1224 (!) 144/81     Pulse Rate 03/25/19 1224 (!) 101     Resp 03/25/19 1224 16     Temp 03/25/19 1224 98.3 F (36.8 C)     Temp Source 03/25/19 1224 Oral     SpO2 03/25/19 1224 94 %     Weight 03/25/19 1220 175 lb (79.4 kg)     Height 03/25/19 1220 5\' 8"  (1.727 m)     Head Circumference --      Peak Flow --       Pain Score 03/25/19 1220 5     Pain Loc --      Pain Edu? --      Excl. in [place]? --     Vital signs reviewed, nursing assessments reviewed.   Constitutional:   Alert and oriented. Non-toxic appearance. Eyes:   Conjunctivae are normal. EOMI.  ENT      Head:   Normocephalic and atraumatic.      Nose:   Normal      Mouth/Throat:   Moist mucous membranes      Neck:   No meningismus. Full ROM. Hematological/Lymphatic/Immunilogical:   No cervical lymphadenopathy. Cardiovascular:   RRR. Symmetric bilateral radial and DP pulses.  No murmurs. Cap refill less than 2 seconds. Respiratory:   Normal respiratory effort without tachypnea/retractions. Breath sounds are clear and equal bilaterally. No wheezes/rales/rhonchi. Gastrointestinal:   Soft and nontender. Non distended. There is no CVA tenderness.  No rebound, rigidity, or guarding.  Musculoskeletal:   Normal range of motion in all extremities. No joint effusions.  No lower extremity tenderness.  No edema. Neurologic:   Normal speech and language.  Motor grossly intact. Ambulatory with steady gait.  Normal coordination. No acute focal neurologic deficits are appreciated.  Skin:    Skin is warm, dry and intact. No rash noted.  No petechiae, purpura, or bullae.  ____________________________________________    LABS (pertinent positives/negatives) (all labs ordered are listed, but only abnormal results are displayed) Labs Reviewed  COMPREHENSIVE METABOLIC PANEL - Abnormal; Notable for the following components:      Result Value   BUN 25 (*)    All other components within normal limits  ETHANOL - Abnormal; Notable for the following components:   Alcohol, Ethyl (B) 157 (*)    All other components within normal limits  CBC - Abnormal; Notable for the following components:   RBC 5.98 (*)    Hemoglobin 12.3 (*)    MCV 70.1 (*)    MCH 20.6 (*)    MCHC 29.4 (*)    RDW 20.5 (*)    All other components within normal limits  URINE DRUG  SCREEN, QUALITATIVE ([facility] ONLY)   ____________________________________________   EKG    ____________________________________________    RADIOLOGY  No results found.  ____________________________________________   PROCEDURES Procedures  ____________________________________________    CLINICAL IMPRESSION / ASSESSMENT AND PLAN / ED COURSE  Medications ordered in the ED: Medications - No data to display  Pertinent labs & imaging results that were available during my care of the patient were reviewed by me and considered in my medical decision making (see chart for details).  Kang Brilla was evaluated in Emergency Department on 03/25/2019 for the symptoms described in the  history of present illness. He was evaluated in the context of the global COVID-19 pandemic, which necessitated consideration that the patient might be at risk for infection with the SARS-CoV-2 virus that causes COVID-19. Institutional protocols and algorithms that pertain to the evaluation of patients at risk for COVID-19 are in a state of rapid change based on information released by regulatory bodies including the CDC and federal and state organizations. These policies and algorithms were followed during the patient's care in the ED.   Patient brought to the ED due to suicidal ideation.  He is currently clinically sober, denies SI or HI, not a danger to himself or others.  Not committable.  He wishes to leave which is reasonable and he can be discharged under his own care at this point.      ____________________________________________   FINAL CLINICAL IMPRESSION(S) / ED DIAGNOSES    Final diagnoses:  Alcohol abuse     ED Discharge Orders    None      Portions of this note were generated with dragon dictation software. Dictation errors may occur despite best attempts at proofreading.   Carrie Mew, MD 03/25/19 [phone]

## 2019-03-25 NOTE — ED Notes (Signed)
BEHAVIORAL HEALTH ROUNDING Patient sleeping: No. Patient alert and oriented: yes Behavior appropriate: Yes.  ; If no, describe:  Nutrition and fluids offered: yes Toileting and hygiene offered: Yes  Sitter present: q15 minute observations  Law enforcement present: Yes

## 2019-03-25 NOTE — ED Triage Notes (Signed)
Pt to ED via [employer], voluntarily for SI. Pt told [employer] that he got into an argument with his girl friend and said that he wanted to die. Pt states that he took 4 Trazodone tablets and 1 Nyquil pill. Pt states that he is not having SI any longer and that he just wants to go to a hotel and be by himself. Pt states that he is having a lot of flashbacks from the war and just wants to be alone for a while. Pt states that "no doctor can help me, only God above can help me, but he isn't doing it". Pt is in NAD at this time.

## 2019-03-25 NOTE — ED Notes (Addendum)
BEHAVIORAL HEALTH ROUNDING Patient sleeping: No. Patient alert : yes Behavior appropriate: Yes.  ; If no, describe:  Nutrition and fluids offered: yes Toileting and hygiene offered: Yes  Sitter present: q15 minute observations  Law enforcement present: Yes    ENVIRONMENTAL ASSESSMENT Potentially harmful objects out of patient reach: Yes.   Personal belongings secured: Yes.   Patient dressed in hospital provided attire only: Yes.   Plastic bags out of patient reach: Yes.   Patient care equipment (cords, cables, call bells, lines, and drains) shortened, removed, or accounted for: Yes.   Equipment and supplies removed from bottom of stretcher: Yes.   Potentially toxic materials out of patient reach: Yes.   Sharps container removed or out of patient reach: Yes.

## 2019-03-25 NOTE — ED Notes (Signed)
He arrives to the ED via [employer] from his girlfriend's house - pt appears intoxicated  He has been drinking this am and verbalized suicidal ideation to his girlfriend  [employer] reports that he came here voluntarily

## 2019-03-27 ENCOUNTER — Emergency Department: Payer: No Typology Code available for payment source

## 2019-03-27 ENCOUNTER — Emergency Department
Admission: EM | Admit: 2019-03-27 | Discharge: 2019-03-28 | Disposition: A | Discharge status: Home / Self Care | Payer: No Typology Code available for payment source | Attending: Emergency Medicine | Admitting: Emergency Medicine

## 2019-03-27 DIAGNOSIS — I10 Essential (primary) hypertension: Secondary | ICD-10-CM | POA: Insufficient documentation

## 2019-03-27 DIAGNOSIS — Z79899 Other long term (current) drug therapy: Secondary | ICD-10-CM | POA: Diagnosis not present

## 2019-03-27 DIAGNOSIS — S022XXA Fracture of nasal bones, initial encounter for closed fracture: Secondary | ICD-10-CM | POA: Insufficient documentation

## 2019-03-27 DIAGNOSIS — S0012XA Contusion of left eyelid and periocular area, initial encounter: Secondary | ICD-10-CM | POA: Insufficient documentation

## 2019-03-27 DIAGNOSIS — X58XXXA Exposure to other specified factors, initial encounter: Secondary | ICD-10-CM | POA: Insufficient documentation

## 2019-03-27 DIAGNOSIS — F10221 Alcohol dependence with intoxication delirium: Secondary | ICD-10-CM | POA: Diagnosis present

## 2019-03-27 DIAGNOSIS — Y9259 Other trade areas as the place of occurrence of the external cause: Secondary | ICD-10-CM | POA: Insufficient documentation

## 2019-03-27 DIAGNOSIS — Y999 Unspecified external cause status: Secondary | ICD-10-CM | POA: Insufficient documentation

## 2019-03-27 DIAGNOSIS — Y908 Blood alcohol level of 240 mg/100 ml or more: Secondary | ICD-10-CM | POA: Diagnosis not present

## 2019-03-27 DIAGNOSIS — F10921 Alcohol use, unspecified with intoxication delirium: Secondary | ICD-10-CM

## 2019-03-27 DIAGNOSIS — Y939 Activity, unspecified: Secondary | ICD-10-CM | POA: Diagnosis not present

## 2019-03-27 LAB — CBC WITH DIFFERENTIAL/PLATELET
Abs Immature Granulocytes: 0.08 10*3/uL — ABNORMAL HIGH (ref 0.00–0.07)
Basophils Absolute: 0.1 10*3/uL (ref 0.0–0.1)
Basophils Relative: 1 %
Eosinophils Absolute: 0.3 10*3/uL (ref 0.0–0.5)
Eosinophils Relative: 4 %
HCT: 42.8 % (ref 39.0–52.0)
Hemoglobin: 12.6 g/dL — ABNORMAL LOW (ref 13.0–17.0)
Immature Granulocytes: 1 %
Lymphocytes Relative: 26 %
Lymphs Abs: 2.1 10*3/uL (ref 0.7–4.0)
MCH: 20.5 pg — ABNORMAL LOW (ref 26.0–34.0)
MCHC: 29.4 g/dL — ABNORMAL LOW (ref 30.0–36.0)
MCV: 69.7 fL — ABNORMAL LOW (ref 80.0–100.0)
Monocytes Absolute: 0.8 10*3/uL (ref 0.1–1.0)
Monocytes Relative: 10 %
Neutro Abs: 4.6 10*3/uL (ref 1.7–7.7)
Neutrophils Relative %: 58 %
Platelets: 391 10*3/uL (ref 150–400)
RBC: 6.14 MIL/uL — ABNORMAL HIGH (ref 4.22–5.81)
RDW: 21 % — ABNORMAL HIGH (ref 11.5–15.5)
Smear Review: NORMAL
WBC: 8 10*3/uL (ref 4.0–10.5)
nRBC: 0 % (ref 0.0–0.2)

## 2019-03-27 LAB — BASIC METABOLIC PANEL
Anion gap: 11 (ref 5–15)
BUN: 17 mg/dL (ref 8–23)
CO2: 27 mmol/L (ref 22–32)
Calcium: 9.3 mg/dL (ref 8.9–10.3)
Chloride: 107 mmol/L (ref 98–111)
Creatinine, Ser: 1.11 mg/dL (ref 0.61–1.24)
GFR calc Af Amer: >60 mL/min (ref >60)
GFR calc non Af Amer: >60 mL/min (ref >60)
Glucose, Bld: 107 mg/dL — ABNORMAL HIGH (ref 70–99)
Potassium: 3.9 mmol/L (ref 3.5–5.1)
Sodium: 145 mmol/L (ref 135–145)

## 2019-03-27 LAB — ETHANOL: Alcohol, Ethyl (B): 418 mg/dL (ref <10)

## 2019-03-27 MED ORDER — DROPERIDOL 2.5 MG/ML IJ SOLN
5.0000 mg | Freq: Once | INTRAMUSCULAR | Status: AC
  Administered 2019-03-27: 5 mg via INTRAMUSCULAR
  Filled 2019-03-27: qty 2

## 2019-03-27 MED ORDER — THIAMINE HCL 100 MG/ML IJ SOLN
Freq: Once | INTRAVENOUS | Status: AC
  Administered 2019-03-28: 02:00:00 via INTRAVENOUS
  Filled 2019-03-27: qty 1000

## 2019-03-27 NOTE — ED Notes (Signed)
Patient continues to scream periodically while laying down on the bed.

## 2019-03-27 NOTE — ED Triage Notes (Addendum)
Patient to ED via [facility]. He was found at [facility] and suites screaming. The person next to his room told the receptionist and they called EMS. Patient is intoxicated, NAD. Patient came with right black eye and abrasion on his forehead. EMS

## 2019-03-27 NOTE — ED Provider Notes (Signed)
-----------------------------------------   12:00 AM on 03/28/2019 -----------------------------------------  Patient awoke from slumber and is screaming again.  Unable to get a CT at this time.  Will obtain EKG prior to redosing droperidol.   ----------------------------------------- 12:57 AM on 03/28/2019 -----------------------------------------  ED ECG REPORT I, Atilano Covelli J, the attending physician, personally viewed and interpreted this ECG.   Date: 03/28/2019  EKG Time: 0038  Rate: 77  Rhythm: normal EKG, normal sinus rhythm  Axis: Normal  Intervals:none  ST&T Change: Nonspecific QTC 436   ----------------------------------------- 6:11 AM on 03/28/2019 -----------------------------------------  No further events.  CT scans unremarkable.  Anticipate discharge once patient is awake and ambulatory.  CT head/cervical spine/maxillofacial interpreted per Dr. Ardeen Garland: Prior right craniotomy with underlying dural thickening, stable  since prior study. No acute intracranial abnormality.    Nasal bone fractures bilaterally. Buckling of the nasal septum.  These are age indeterminate. No overlying soft tissue swelling.    No acute bony abnormality in the cervical spine. Spondylosis.       Paulette Blanch, MD 03/28/19 [phone]

## 2019-03-27 NOTE — ED Provider Notes (Signed)
[facility] Emergency Department Provider Note   ____________________________________________   First MD Initiated Contact with Patient 03/27/19 2146     (approximate)  I have reviewed the triage vital signs and the nursing notes.   HISTORY  Chief Complaint Alcohol Intoxication    HPI Omar Davis is a 68 y.o. male with possible history of TBI, PTSD, hypertension, and alcohol abuse who presents to the ED for alcohol intoxication.  History is limited due to patient's level of intoxication.  EM[facility] was called [facility] [place], where the patient has been staying, when the person in the next room called to say that he was screaming over and over again.  EMS found the patient to be intoxicated and with ecchymosis to his left eye and abrasion to his forehead.  Patient continues to yell out upon arrival to the ED, is not able to provide any coherent history.        Past Medical History:  Diagnosis Date  . Chronic pain   . Depressed   . Hypertension   . PTSD (post-traumatic stress disorder)   . TBI (traumatic brain injury) [facility])     Patient Active Problem List   Diagnosis Date Noted  . Alcohol intoxication with delirium ([place])   . Sepsis ([place]) 02/08/2019  . Essential hypertension 02/05/2019  . Alcohol-induced mood disorder ([place]) 01/24/2019  . Alcohol dependence with intoxication, uncomplicated ([place]) [id number]  . Major depressive disorder, recurrent severe without psychotic features ([place]) 08/24/2018  . Suicidal ideation 09/25/2015  . Alcohol abuse 09/09/2015  . PTSD (post-traumatic stress disorder) 09/09/2015  . Agitation 09/09/2015  . Depressed   . Chronic pain     Past Surgical History:  Procedure Laterality Date  . BACK SURGERY    . HERNIA REPAIR    . KNEE SURGERY    . KNEE SURGERY    . prostectomy N/A     Prior to Admission medications   Medication Sig Start Date End Date Taking? Authorizing Provider  divalproex (DEPAKOTE  ER) 500 MG 24 hr tablet Take 2 tablets (1,000 mg total) by mouth 2 (two) times daily. Patient not taking: Reported on 03/03/2019 02/07/19   Clapacs, Madie Reno, MD  DULoxetine (CYMBALTA) 30 MG capsule Take 1 capsule (30 mg total) by mouth daily. Patient not taking: Reported on 03/03/2019 02/07/19   Clapacs, Madie Reno, MD  lisinopril (ZESTRIL) 10 MG tablet Take 1 tablet (10 mg total) by mouth daily. Patient not taking: Reported on 03/03/2019 02/07/19   Clapacs, Madie Reno, MD  naproxen (NAPROSYN) 500 MG tablet Take 1 tablet (500 mg total) by mouth 2 (two) times daily as needed for moderate pain. Patient not taking: Reported on 03/03/2019 02/07/19   Clapacs, Madie Reno, MD  pravastatin (PRAVACHOL) 80 MG tablet Take 1 tablet (80 mg total) by mouth at bedtime. Patient not taking: Reported on 03/03/2019 02/07/19   Clapacs, Madie Reno, MD  thiamine 100 MG tablet Take 1 tablet (100 mg total) by mouth daily. Patient not taking: Reported on 03/03/2019 02/07/19   Clapacs, Madie Reno, MD    Allergies Codeine, Gabapentin, Penicillins, and Lisinopril  No family history on file.  Social History Social History   Tobacco Use  . Smoking status: Never Smoker  . Smokeless tobacco: Never Used  Substance Use Topics  . Alcohol use: Yes    Comment: 1 pint of vodka  . Drug use: Not Currently    Review of Systems Unable to obtain secondary to intoxication  ____________________________________________   PHYSICAL EXAM:  VITAL SIGNS: ED Triage Vitals  Enc Vitals Group     BP      Pulse      Resp      Temp      Temp src      SpO2      Weight      Height      Head Circumference      Peak Flow      Pain Score      Pain Loc      Pain Edu?      Excl. in [place]?     Constitutional: Intoxicated appearing, intermittently yelling out. Eyes: Conjunctivae are normal.  Pupils equal round and reactive to light bilaterally.  Left periorbital ecchymosis with no proptosis. Head: Abrasion to left forehead. Nose: No  congestion/rhinnorhea. Mouth/Throat: Mucous membranes are moist. Neck: Normal ROM Cardiovascular: Normal rate, regular rhythm. Grossly normal heart sounds. Respiratory: Normal respiratory effort.  No retractions. Lungs CTAB. Gastrointestinal: Soft and nontender. No distention. Genitourinary: deferred Musculoskeletal: No lower extremity tenderness nor edema. Neurologic: Moving all extremities spontaneously. Skin:  Skin is warm, dry and intact. No rash noted.  ____________________________________________   LABS (all labs ordered are listed, but only abnormal results are displayed)  Labs Reviewed  CBC WITH DIFFERENTIAL/PLATELET - Abnormal; Notable for the following components:      Result Value   RBC 6.14 (*)    Hemoglobin 12.6 (*)    MCV 69.7 (*)    MCH 20.5 (*)    MCHC 29.4 (*)    RDW 21.0 (*)    Abs Immature Granulocytes 0.08 (*)    All other components within normal limits  ETHANOL - Abnormal; Notable for the following components:   Alcohol, Ethyl (B) 418 (*)    All other components within normal limits  BASIC METABOLIC PANEL - Abnormal; Notable for the following components:   Glucose, Bld 107 (*)    All other components within normal limits     PROCEDURES  Procedure(s) performed (including Critical Care):  Procedures   ____________________________________________   INITIAL IMPRESSION / ASSESSMENT AND PLAN / ED COURSE       68 year old male presents to the ED intermittently yelling out and unable to provide any significant history.  Patient well-known to this department with recurrent visits for alcohol intoxication and he appears intoxicated again today.  He does have signs of trauma to his forehead and left eye and given lack of history, will obtain CT head, maxillofacial, and C-spine.  There are no other obvious signs of trauma.  Patient is uncooperative with exam and required droperidol in order to facilitate CT scans.  He is now calm and resting comfortably,  will continue to observe.  Blood alcohol significantly elevated, labs unremarkable.  Patient CTs are pending, however if these are negative he would be appropriate for discharge home once clinically sober.      ____________________________________________   FINAL CLINICAL IMPRESSION(S) / ED DIAGNOSES  Final diagnoses:  Alcohol intoxication with delirium ([place])  Periorbital ecchymosis of left eye, initial encounter     ED Discharge Orders    None       Note:  This document was prepared using Dragon voice recognition software and may include unintentional dictation errors.   Blake Divine, MD 03/27/19 2320

## 2019-03-27 NOTE — ED Notes (Signed)
Patient is resting with eyes closed in no acute distress.Q15 minute rounds and Engineer, drilling presence for safety.

## 2019-03-28 ENCOUNTER — Emergency Department: Payer: No Typology Code available for payment source

## 2019-03-28 MED ORDER — LORAZEPAM 2 MG/ML IJ SOLN
0.0000 mg | Freq: Four times a day (QID) | INTRAMUSCULAR | Status: DC
  Administered 2019-03-28 (×2): 2 mg via INTRAVENOUS
  Filled 2019-03-28 (×2): qty 1

## 2019-03-28 MED ORDER — DROPERIDOL 2.5 MG/ML IJ SOLN
1.2500 mg | Freq: Once | INTRAMUSCULAR | Status: AC
  Administered 2019-03-28: 01:00:00 1.25 mg via INTRAVENOUS
  Filled 2019-03-28: qty 2

## 2019-03-28 NOTE — ED Notes (Signed)
Hourly rounding reveals patient in room. No complaints, stable, in no acute distress. Q15 minute rounds and monitoring via Rover and Officer to continue.   

## 2019-03-28 NOTE — Discharge Instructions (Signed)
Drink alcohol only in moderation.  Return to the ER for worsening symptoms, persistent vomiting, difficulty breathing or other concerns. 

## 2019-03-28 NOTE — ED Notes (Signed)
VOL/INTOXICATED

## 2019-03-28 NOTE — ED Notes (Signed)
Pt will be discharged.  [employer] cab called by Therapist, sports.  Pt is paying for the cab - no voucher.

## 2019-03-28 NOTE — ED Notes (Signed)
Pt discharged home in cab. Phone and wallet returned to patient. VS stable. Pt denies SI. Discharge instructions reviewed with patient.

## 2019-03-28 NOTE — ED Notes (Signed)
Pt ate food in meal tray.

## 2019-03-28 NOTE — ED Notes (Signed)
Pt asking when he can leave and return to his motel. Pt denies SI. Pt is calm and cooperative with staff.

## 2019-03-30 ENCOUNTER — Encounter: Payer: Self-pay | Admitting: Emergency Medicine

## 2019-03-30 ENCOUNTER — Other Ambulatory Visit: Payer: Self-pay

## 2019-03-30 ENCOUNTER — Emergency Department
Admission: EM | Admit: 2019-03-30 | Discharge: 2019-03-30 | Disposition: A | Discharge status: Home / Self Care | Payer: No Typology Code available for payment source | Attending: Emergency Medicine | Admitting: Emergency Medicine

## 2019-03-30 DIAGNOSIS — F1010 Alcohol abuse, uncomplicated: Secondary | ICD-10-CM

## 2019-03-30 DIAGNOSIS — I10 Essential (primary) hypertension: Secondary | ICD-10-CM | POA: Diagnosis not present

## 2019-03-30 DIAGNOSIS — F10120 Alcohol abuse with intoxication, uncomplicated: Secondary | ICD-10-CM | POA: Insufficient documentation

## 2019-03-30 NOTE — Discharge Instructions (Addendum)
Please seek medical attention and help for any thoughts about wanting to harm yourself, harm others, any concerning change in behavior, severe depression, inappropriate drug use or any other new or concerning symptoms. ° °

## 2019-03-30 NOTE — ED Notes (Signed)
Called pts emergency contact, Dottie, She states that she is unable to come and pick pt up. Dottie states that pt has money to pay for a taxi home.

## 2019-03-30 NOTE — ED Notes (Signed)
Pt refused to sign for discharge 

## 2019-03-30 NOTE — ED Notes (Signed)
Pt ambulatory with assistance to restroom

## 2019-03-30 NOTE — ED Triage Notes (Signed)
Pt to ED via [facility] from home for apparent drug overdose. Per EMS they were called out by [employer] who stated that pt ingested an unknown substance and unknown amount. Upon arrival to ED pt yelling and screaming "Fuck me". When pt is asked what he took pt yells "I'm fucked up sis". Pt vital signs are stable at this time.

## 2019-03-30 NOTE — ED Provider Notes (Signed)
[facility] Emergency Department Provider Note   ____________________________________________   I have reviewed the triage vital signs and the nursing notes.   HISTORY  Chief Complaint Alcohol intoxication  History limited by and level 5 caveat due to: Intoxication  HPI Benyamin Nakamura is a 68 y.o. male who presents to the emergency department today brought in by EMS because of concerns for unusual ingestion.  Patient is well-known to the emergency department.  Patient is seen frequently for alcohol intoxication.  Patient states "fuck me" during exam.   Records reviewed. Per medical record review patient has a history of frequent visits to the emergency department for alcohol intoxication. Seen 7 times in the past month for alcohol intoxication.   Past Medical History:  Diagnosis Date  . Chronic pain   . Depressed   . Hypertension   . PTSD (post-traumatic stress disorder)   . TBI (traumatic brain injury) [facility])     Patient Active Problem List   Diagnosis Date Noted  . Alcohol intoxication with delirium ([place])   . Sepsis ([place]) 02/08/2019  . Essential hypertension 02/05/2019  . Alcohol-induced mood disorder ([place]) 01/24/2019  . Alcohol dependence with intoxication, uncomplicated ([place]) [id number]  . Major depressive disorder, recurrent severe without psychotic features ([place]) 08/24/2018  . Suicidal ideation 09/25/2015  . Alcohol abuse 09/09/2015  . PTSD (post-traumatic stress disorder) 09/09/2015  . Agitation 09/09/2015  . Depressed   . Chronic pain     Past Surgical History:  Procedure Laterality Date  . BACK SURGERY    . HERNIA REPAIR    . KNEE SURGERY    . KNEE SURGERY    . prostectomy N/A     Prior to Admission medications   Medication Sig Start Date End Date Taking? Authorizing Provider  divalproex (DEPAKOTE ER) 500 MG 24 hr tablet Take 2 tablets (1,000 mg total) by mouth 2 (two) times daily. Patient not taking: Reported on 03/03/2019  02/07/19   Clapacs, Madie Reno, MD  DULoxetine (CYMBALTA) 30 MG capsule Take 1 capsule (30 mg total) by mouth daily. Patient not taking: Reported on 03/03/2019 02/07/19   Clapacs, Madie Reno, MD  lisinopril (ZESTRIL) 10 MG tablet Take 1 tablet (10 mg total) by mouth daily. Patient not taking: Reported on 03/03/2019 02/07/19   Clapacs, Madie Reno, MD  naproxen (NAPROSYN) 500 MG tablet Take 1 tablet (500 mg total) by mouth 2 (two) times daily as needed for moderate pain. Patient not taking: Reported on 03/03/2019 02/07/19   Clapacs, Madie Reno, MD  pravastatin (PRAVACHOL) 80 MG tablet Take 1 tablet (80 mg total) by mouth at bedtime. Patient not taking: Reported on 03/03/2019 02/07/19   Clapacs, Madie Reno, MD  thiamine 100 MG tablet Take 1 tablet (100 mg total) by mouth daily. Patient not taking: Reported on 03/03/2019 02/07/19   Clapacs, Madie Reno, MD    Allergies Codeine, Gabapentin, Penicillins, and Lisinopril  No family history on file.  Social History Social History   Tobacco Use  . Smoking status: Never Smoker  . Smokeless tobacco: Never Used  Substance Use Topics  . Alcohol use: Yes    Comment: 1 pint of vodka  . Drug use: Not Currently    Review of Systems Unable to obtain reliable ROS secondary to intoxication.  ____________________________________________   PHYSICAL EXAM:  VITAL SIGNS: ED Triage Vitals  Enc Vitals Group     BP 03/30/19 1642 131/90     Pulse Rate 03/30/19 1642 75     Resp  03/30/19 1651 16     Temp 03/30/19 1651 97.8 F (36.6 C)     Temp Source 03/30/19 1651 Oral     SpO2 03/30/19 1642 96 %     Weight --      Height --      Head Circumference --      Peak Flow --      Pain Score 03/30/19 1648 0   Constitutional: Awake and alert. Intoxicated. Yelling.  Eyes: Conjunctivae are normal.  ENT      Head: Normocephalic and atraumatic.      Nose: No congestion/rhinnorhea.      Mouth/Throat: Mucous membranes are moist.      Neck: No  stridor. Hematological/Lymphatic/Immunilogical: No cervical lymphadenopathy. Cardiovascular: Normal rate, regular rhythm.  No murmurs, rubs, or gallops.  Respiratory: Normal respiratory effort without tachypnea nor retractions. Breath sounds are clear and equal bilaterally. No wheezes/rales/rhonchi. Gastrointestinal: Soft and non tender. No rebound. No guarding.  Genitourinary: Deferred Musculoskeletal: Normal range of motion in all extremities. No lower extremity edema. Neurologic:  Appears intoxicated. Yelling.  Skin:  Skin is warm, dry and intact. No rash noted. ____________________________________________    LABS (pertinent positives/negatives)  None  ____________________________________________   EKG  None  ____________________________________________    RADIOLOGY  None  ____________________________________________   PROCEDURES  Procedures  ____________________________________________   INITIAL IMPRESSION / ASSESSMENT AND PLAN / ED COURSE  Pertinent labs & imaging results that were available during my care of the patient were reviewed by me and considered in my medical decision making (see chart for details).   Patient presented to the emergency department today brought in because of concerns for abnormal ingestion.  On exam patient appears intoxicated.  He has had similar presentation many times in the past.  He does not appear to be acting any differently than he normally does with his intoxication.  Patient's normal course is to become sober in the emergency department and then be discharged home.  At this point however do think patient can sober up at home.  Do not have any reason to suspect that today's episode is any different than his previous episodes.  Vital signs are normal.  ____________________________________________   FINAL CLINICAL IMPRESSION(S) / ED DIAGNOSES  Final diagnoses:  Alcohol abuse     Note: This dictation was prepared with Dragon  dictation. Any transcriptional errors that result from this process are unintentional     Nance Pear, MD 03/30/19 1723

## 2019-04-05 ENCOUNTER — Inpatient Hospital Stay: Payer: Medicare HMO | Attending: Oncology | Admitting: Oncology

## 2019-04-05 ENCOUNTER — Encounter: Payer: Self-pay | Admitting: Oncology

## 2019-04-05 ENCOUNTER — Other Ambulatory Visit: Payer: Self-pay

## 2019-04-05 VITALS — BP 127/84 | HR 88 | Temp 96.8°F | Ht 68.0 in | Wt 179.0 lb

## 2019-04-05 DIAGNOSIS — D509 Iron deficiency anemia, unspecified: Secondary | ICD-10-CM | POA: Diagnosis not present

## 2019-04-05 DIAGNOSIS — F1010 Alcohol abuse, uncomplicated: Secondary | ICD-10-CM | POA: Insufficient documentation

## 2019-04-05 NOTE — Progress Notes (Signed)
Patient is here today to establish care for low ferritin.

## 2019-04-08 ENCOUNTER — Encounter: Payer: Self-pay | Admitting: Emergency Medicine

## 2019-04-08 ENCOUNTER — Emergency Department
Admission: EM | Admit: 2019-04-08 | Discharge: 2019-04-08 | Disposition: A | Discharge status: Home / Self Care | Payer: No Typology Code available for payment source | Attending: Emergency Medicine | Admitting: Emergency Medicine

## 2019-04-08 ENCOUNTER — Other Ambulatory Visit: Payer: Self-pay

## 2019-04-08 ENCOUNTER — Encounter: Payer: Self-pay | Admitting: Oncology

## 2019-04-08 DIAGNOSIS — Z79899 Other long term (current) drug therapy: Secondary | ICD-10-CM | POA: Diagnosis not present

## 2019-04-08 DIAGNOSIS — D509 Iron deficiency anemia, unspecified: Secondary | ICD-10-CM | POA: Insufficient documentation

## 2019-04-08 DIAGNOSIS — I10 Essential (primary) hypertension: Secondary | ICD-10-CM | POA: Diagnosis not present

## 2019-04-08 DIAGNOSIS — F10920 Alcohol use, unspecified with intoxication, uncomplicated: Secondary | ICD-10-CM | POA: Diagnosis present

## 2019-04-08 DIAGNOSIS — F1010 Alcohol abuse, uncomplicated: Secondary | ICD-10-CM | POA: Diagnosis not present

## 2019-04-08 NOTE — ED Notes (Signed)
Pt resting comfortably in bed

## 2019-04-08 NOTE — ED Notes (Signed)
Waiting for cab

## 2019-04-08 NOTE — ED Triage Notes (Signed)
Pt in via [facility] from home; per EMS call out was for Trazadone overdose.  Pt reports taking prescribed amount of Trazadone; EMS reports drug count was correct.  Pt belligerent upon arrival, screaming, smells of alcohol.  [employer] at bedside.

## 2019-04-08 NOTE — ED Notes (Signed)
Cab Service called for patient to return home.  Pt to remain in room until cab arrives.

## 2019-04-08 NOTE — ED Notes (Signed)
Omar Davis will be here in 20 minutes

## 2019-04-08 NOTE — Discharge Instructions (Addendum)
Please seek medical attention for any high fevers, chest pain, shortness of breath, change in behavior, persistent vomiting, bloody stool or any other new or concerning symptoms.  

## 2019-04-08 NOTE — ED Notes (Signed)
Patient wheeled to cab, refused vitals at discharge, verbally aggressive. Escorted by tech

## 2019-04-08 NOTE — Progress Notes (Signed)
Hematology/Oncology Consult note [facility] [phone] [phone]  Patient Care Team: [place] as PCP - General (General Practice)   Name of the patient: Omar Davis  [id number]  12-25-1950    Reason for referral- low ferritin   Referring physician- Dr. Kristine Linea  Date of visit: 04/08/19   History of presenting illness- Patient is a 68 year old male with history of alcohol abuse and intoxication and frequent ER visits in the last 6 to 7 months.  Patient reports that he has been drinking almost daily alcohol for the last 2 years.  He was recently seen by neurology for symptoms of tingling numbness in his extremities.  During that time he was found to have a low ferritin of 13.  Folic acid was normal at 12.3.  Iron saturation was low at 18.  B1 levels were 240 which were normal to high.  B12 levels were normal at 910.  Most recent CBC showed white count of 8, H&H of 12.6/42.8 with an MCV of 69.7 and a platelet count of 391.  Patient also gets his care at the [place] and reports that he has had recent endoscopy and colonoscopy there in the last 6 months.  He denies any dark tarry stools or bright red blood in his stools.  Denies any consistent use of NSAIDs.  Denies any unintentional weight loss  ECOG PS- 1  Pain scale- 0   Review of systems- Review of Systems  Constitutional: Positive for malaise/fatigue. Negative for chills, fever and weight loss.  HENT: Negative for congestion, ear discharge and nosebleeds.   Eyes: Negative for blurred vision.  Respiratory: Negative for cough, hemoptysis, sputum production, shortness of breath and wheezing.   Cardiovascular: Negative for chest pain, palpitations, orthopnea and claudication.  Gastrointestinal: Negative for abdominal pain, blood in stool, constipation, diarrhea, heartburn, melena, nausea and vomiting.  Genitourinary: Negative for dysuria, flank pain, frequency, hematuria and urgency.   Musculoskeletal: Negative for back pain, joint pain and myalgias.  Skin: Negative for rash.  Neurological: Positive for sensory change (Peripheral neuropathy). Negative for dizziness, tingling, focal weakness, seizures, weakness and headaches.  Endo/Heme/Allergies: Does not bruise/bleed easily.  Psychiatric/Behavioral: Negative for depression and suicidal ideas. The patient does not have insomnia.     Allergies  Allergen Reactions  . Codeine Hives and Rash  . Gabapentin Other (See Comments) and Anaphylaxis    seizures Other reaction(s): Other (See Comments) seizures Anaphylactic Shock   . Penicillins Rash    Other reaction(s): Other (See Comments) Other Reaction: Not Assessed Rash   . Lisinopril Swelling    Pt reports lip swelling    Patient Active Problem List   Diagnosis Date Noted  . Alcohol intoxication with delirium ([place])   . Sepsis ([place]) 02/08/2019  . Essential hypertension 02/05/2019  . Alcohol-induced mood disorder ([place]) 01/24/2019  . Alcohol dependence with intoxication, uncomplicated ([place]) [id number]  . Major depressive disorder, recurrent severe without psychotic features ([place]) 08/24/2018  . Suicidal ideation 09/25/2015  . Alcohol abuse 09/09/2015  . PTSD (post-traumatic stress disorder) 09/09/2015  . Agitation 09/09/2015  . Depressed   . Chronic pain      Past Medical History:  Diagnosis Date  . Chronic pain   . Depressed   . Hypertension   . PTSD (post-traumatic stress disorder)   . TBI (traumatic brain injury) [facility])      Past Surgical History:  Procedure Laterality Date  . BACK SURGERY    . HERNIA REPAIR    .  KNEE SURGERY    . KNEE SURGERY    . prostectomy N/A     Social History   Socioeconomic History  . Marital status: Legally Separated    Spouse name: Not on file  . Number of children: Not on file  . Years of education: Not on file  . Highest education level: Not on file  Occupational History  . Not on file  Social Needs  .  Financial resource strain: Not on file  . Food insecurity    Worry: Not on file    Inability: Not on file  . Transportation needs    Medical: Not on file    Non-medical: Not on file  Tobacco Use  . Smoking status: Never Smoker  . Smokeless tobacco: Never Used  Substance and Sexual Activity  . Alcohol use: Yes    Comment: 1 pint of vodka  . Drug use: Not Currently  . Sexual activity: Not Currently  Lifestyle  . Physical activity    Days per week: Not on file    Minutes per session: Not on file  . Stress: Not on file  Relationships  . Social Herbalist on phone: Not on file    Gets together: Not on file    Attends religious service: Not on file    Active member of club or organization: Not on file    Attends meetings of clubs or organizations: Not on file    Relationship status: Not on file  . Intimate partner violence    Fear of current or ex partner: Not on file    Emotionally abused: Not on file    Physically abused: Not on file    Forced sexual activity: Not on file  Other Topics Concern  . Not on file  Social History Narrative  . Not on file     History reviewed. No pertinent family history.   Current Outpatient Medications:  .  DULoxetine (CYMBALTA) 30 MG capsule, Take 1 capsule (30 mg total) by mouth daily., Disp: 30 capsule, Rfl: 0 .  divalproex (DEPAKOTE ER) 500 MG 24 hr tablet, Take 2 tablets (1,000 mg total) by mouth 2 (two) times daily. (Patient not taking: Reported on 04/05/2019), Disp: 120 tablet, Rfl: 0 .  lisinopril (ZESTRIL) 10 MG tablet, Take 1 tablet (10 mg total) by mouth daily. (Patient not taking: Reported on 04/05/2019), Disp: 30 tablet, Rfl: 0 .  naproxen (NAPROSYN) 500 MG tablet, Take 1 tablet (500 mg total) by mouth 2 (two) times daily as needed for moderate pain. (Patient not taking: Reported on 04/05/2019), Disp: 60 tablet, Rfl: 0 .  pravastatin (PRAVACHOL) 80 MG tablet, Take 1 tablet (80 mg total) by mouth at bedtime. (Patient not  taking: Reported on 04/05/2019), Disp: 30 tablet, Rfl: 0 .  thiamine 100 MG tablet, Take 1 tablet (100 mg total) by mouth daily. (Patient not taking: Reported on 03/03/2019), Disp: 30 tablet, Rfl: 0   Physical exam:  Vitals:   04/05/19 1007  BP: 127/84  Pulse: 88  Temp: (!) 96.8 F (36 C)  TempSrc: Tympanic  Weight: 179 lb (81.2 kg)  Height: 5\' 8"  (1.727 m)   Physical Exam Constitutional:      General: He is not in acute distress. HENT:     Head: Normocephalic and atraumatic.  Eyes:     Pupils: Pupils are equal, round, and reactive to light.  Neck:     Musculoskeletal: Normal range of motion.  Cardiovascular:  Rate and Rhythm: Normal rate and regular rhythm.     Heart sounds: Normal heart sounds.  Pulmonary:     Effort: Pulmonary effort is normal.     Breath sounds: Normal breath sounds.  Abdominal:     General: Bowel sounds are normal.     Palpations: Abdomen is soft.  Skin:    General: Skin is warm and dry.  Neurological:     Mental Status: He is alert and oriented to person, place, and time.        CMP Latest Ref Rng & Units 03/27/2019  Glucose 70 - 99 mg/dL 107(H)  BUN 8 - 23 mg/dL 17  Creatinine 0.61 - 1.24 mg/dL 1.11  Sodium 135 - 145 mmol/L 145  Potassium 3.5 - 5.1 mmol/L 3.9  Chloride 98 - 111 mmol/L 107  CO2 22 - 32 mmol/L 27  Calcium 8.9 - 10.3 mg/dL 9.3  Total Protein 6.5 - 8.1 g/dL -  Total Bilirubin 0.3 - 1.2 mg/dL -  Alkaline Phos 38 - 126 U/L -  AST 15 - 41 U/L -  ALT 0 - 44 U/L -   CBC Latest Ref Rng & Units 03/27/2019  WBC 4.0 - 10.5 K/uL 8.0  Hemoglobin 13.0 - 17.0 g/dL 12.6(L)  Hematocrit 39.0 - 52.0 % 42.8  Platelets 150 - 400 K/uL 391    No images are attached to the encounter.  Ct Head Wo Contrast  Result Date: 03/28/2019 CLINICAL DATA:  Black right eye.  Abrasions to forehead. EXAM: CT HEAD WITHOUT CONTRAST CT MAXILLOFACIAL WITHOUT CONTRAST CT CERVICAL SPINE WITHOUT CONTRAST TECHNIQUE: Multidetector CT imaging of the head,  cervical spine, and maxillofacial structures were performed using the standard protocol without intravenous contrast. Multiplanar CT image reconstructions of the cervical spine and maxillofacial structures were also generated. COMPARISON:  CT head 01/16/2019 FINDINGS: CT HEAD FINDINGS Brain: Remote right craniotomy. Underlying dural thickening is stable since prior study. No acute intracranial abnormality. Specifically, no hemorrhage, hydrocephalus, mass lesion, acute infarction, or significant intracranial injury. Vascular: No hyperdense vessel or unexpected calcification. Skull: Right craniotomy.  No acute calvarial abnormality. Other: None CT MAXILLOFACIAL FINDINGS Osseous: Nasal bone fractures bilaterally. Buckling of the nasal septum, likely fracture. No additional facial fracture. Orbits: Negative. No traumatic or inflammatory finding. Sinuses: Clear Soft tissues: Negative CT CERVICAL SPINE FINDINGS Alignment: Normal. Skull base and vertebrae: No acute fracture. No primary bone lesion or focal pathologic process. Soft tissues and spinal canal: No prevertebral fluid or swelling. No visible canal hematoma. Disc levels:  Diffuse degenerative disc and facet disease. Upper chest: No acute finding Other: None IMPRESSION: Prior right craniotomy with underlying dural thickening, stable since prior study. No acute intracranial abnormality. Nasal bone fractures bilaterally. Buckling of the nasal septum. These are age indeterminate. No overlying soft tissue swelling. No acute bony abnormality in the cervical spine.  Spondylosis. Electronically Signed   By: Rolm Baptise M.D.   On: 03/28/2019 01:41   Ct Cervical Spine Wo Contrast  Result Date: 03/28/2019 CLINICAL DATA:  Black right eye.  Abrasions to forehead. EXAM: CT HEAD WITHOUT CONTRAST CT MAXILLOFACIAL WITHOUT CONTRAST CT CERVICAL SPINE WITHOUT CONTRAST TECHNIQUE: Multidetector CT imaging of the head, cervical spine, and maxillofacial structures were performed  using the standard protocol without intravenous contrast. Multiplanar CT image reconstructions of the cervical spine and maxillofacial structures were also generated. COMPARISON:  CT head 01/16/2019 FINDINGS: CT HEAD FINDINGS Brain: Remote right craniotomy. Underlying dural thickening is stable since prior study. No acute intracranial abnormality. Specifically,  no hemorrhage, hydrocephalus, mass lesion, acute infarction, or significant intracranial injury. Vascular: No hyperdense vessel or unexpected calcification. Skull: Right craniotomy.  No acute calvarial abnormality. Other: None CT MAXILLOFACIAL FINDINGS Osseous: Nasal bone fractures bilaterally. Buckling of the nasal septum, likely fracture. No additional facial fracture. Orbits: Negative. No traumatic or inflammatory finding. Sinuses: Clear Soft tissues: Negative CT CERVICAL SPINE FINDINGS Alignment: Normal. Skull base and vertebrae: No acute fracture. No primary bone lesion or focal pathologic process. Soft tissues and spinal canal: No prevertebral fluid or swelling. No visible canal hematoma. Disc levels:  Diffuse degenerative disc and facet disease. Upper chest: No acute finding Other: None IMPRESSION: Prior right craniotomy with underlying dural thickening, stable since prior study. No acute intracranial abnormality. Nasal bone fractures bilaterally. Buckling of the nasal septum. These are age indeterminate. No overlying soft tissue swelling. No acute bony abnormality in the cervical spine.  Spondylosis. Electronically Signed   By: Rolm Baptise M.D.   On: 03/28/2019 01:41   Mr Brain Wo Contrast  Result Date: 03/22/2019 CLINICAL DATA:  [place] with head trauma. Numbness and tingling the head and face. EXAM: MRI HEAD WITHOUT CONTRAST TECHNIQUE: Multiplanar, multiecho pulse sequences of the brain and surrounding structures were obtained without intravenous contrast. COMPARISON:  Head CT 01/16/2019 FINDINGS: Brain: Diffusion imaging does not show  any acute or subacute infarction. The brainstem and cerebellum are normal. Cerebral hemispheres show age related atrophy with mild chronic small-vessel change of the white matter, less than often seen at this age. Small focus of gliosis of the lateral right temporal lobe underlying the region of craniotomy. There is mild right-sided dural thickening without residual or recurrent subdural collection. No evidence of mass, hydrocephalus or extra-axial collection presently. Vascular: Major vessels at the base of the brain show flow. Skull and upper cervical spine: Negative aside from the right craniotomy changes. Sinuses/Orbits: Clear/normal Other: None IMPRESSION: Previous right-sided craniotomy for subdural evacuation. No residual or recurrent subdural hematoma. Mild dural thickening as expected. Small focus of gliosis of the lateral right temporal lobe underlying the region of craniotomy. Only mild/minimal volume loss and small-vessel change elsewhere, considering age. Electronically Signed   By: Nelson Chimes M.D.   On: 03/22/2019 13:21   Ct Maxillofacial Wo Cm  Result Date: 03/28/2019 CLINICAL DATA:  Black right eye.  Abrasions to forehead. EXAM: CT HEAD WITHOUT CONTRAST CT MAXILLOFACIAL WITHOUT CONTRAST CT CERVICAL SPINE WITHOUT CONTRAST TECHNIQUE: Multidetector CT imaging of the head, cervical spine, and maxillofacial structures were performed using the standard protocol without intravenous contrast. Multiplanar CT image reconstructions of the cervical spine and maxillofacial structures were also generated. COMPARISON:  CT head 01/16/2019 FINDINGS: CT HEAD FINDINGS Brain: Remote right craniotomy. Underlying dural thickening is stable since prior study. No acute intracranial abnormality. Specifically, no hemorrhage, hydrocephalus, mass lesion, acute infarction, or significant intracranial injury. Vascular: No hyperdense vessel or unexpected calcification. Skull: Right craniotomy.  No acute calvarial  abnormality. Other: None CT MAXILLOFACIAL FINDINGS Osseous: Nasal bone fractures bilaterally. Buckling of the nasal septum, likely fracture. No additional facial fracture. Orbits: Negative. No traumatic or inflammatory finding. Sinuses: Clear Soft tissues: Negative CT CERVICAL SPINE FINDINGS Alignment: Normal. Skull base and vertebrae: No acute fracture. No primary bone lesion or focal pathologic process. Soft tissues and spinal canal: No prevertebral fluid or swelling. No visible canal hematoma. Disc levels:  Diffuse degenerative disc and facet disease. Upper chest: No acute finding Other: None IMPRESSION: Prior right craniotomy with underlying dural thickening, stable since prior study. No  acute intracranial abnormality. Nasal bone fractures bilaterally. Buckling of the nasal septum. These are age indeterminate. No overlying soft tissue swelling. No acute bony abnormality in the cervical spine.  Spondylosis. Electronically Signed   By: Rolm Baptise M.D.   On: 03/28/2019 01:41    Assessment and plan- Patient is a 68 y.o. male referred for iron deficiency anemia likely secondary to acute alcohol intake  1.  Iron deficiency: We are in the process of figuring out if the patient can get his IV iron here as he is also getting care at the [facility].  If his insurance approves IV iron here I would recommend 5 doses of Venofer 200 mg over 3 weeks.  Discussed risks and benefits of IV iron including all but not limited to headache, leg swelling and possible risk of infusion reaction.  Patient understands and agrees to proceed.  Patient wishes to get GI care here instead of going to the [place].  I do not have any of his recent [place] records at this time.  Again it remains to be seen if we able to authorize his care to be received at [facility] office alternative insurance would pay for it.  Given his ongoing alcohol intake and multiple episodes of intoxication, he will need to establish GI care somewhere.  He does not have any  evidence of chronic liver disease based on his labs but given his ongoing iron deficiency he needs to be evaluated by them.  2.  I will see him back in 3 months time with repeat CBC ferritin iron studies B12 and folate   Thank you for this kind referral and the opportunity to participate in the care of this  Patient   Visit Diagnosis 1. Alcohol abuse   2. Iron deficiency anemia, unspecified iron deficiency anemia type     Dr. Randa Evens, MD, MPH [facility] at [facility] [id number] 04/08/2019

## 2019-04-08 NOTE — ED Provider Notes (Signed)
[facility] Emergency Department Provider Note   ____________________________________________   I have reviewed the triage vital signs and the nursing notes.   HISTORY  Chief Complaint Alcohol Intoxication   History limited by and level 5 caveat due to: Alcohol intoxication   HPI Omar Davis is a 68 y.o. male who presents to the emergency department today via EMS because of concern for trazodone ingestion. Patient states he took 2 trazodone pills. Did start cussing during the exam and trying to grab me.     Records reviewed. Per medical record review patient has a history of frequent ER visits for alcohol intoxication.   Past Medical History:  Diagnosis Date  . Chronic pain   . Depressed   . Hypertension   . PTSD (post-traumatic stress disorder)   . TBI (traumatic brain injury) [facility])     Patient Active Problem List   Diagnosis Date Noted  . Iron deficiency anemia 04/08/2019  . Alcohol intoxication with delirium ([place])   . Sepsis ([place]) 02/08/2019  . Essential hypertension 02/05/2019  . Alcohol-induced mood disorder ([place]) 01/24/2019  . Alcohol dependence with intoxication, uncomplicated ([place]) [id number]  . Major depressive disorder, recurrent severe without psychotic features ([place]) 08/24/2018  . Suicidal ideation 09/25/2015  . Alcohol abuse 09/09/2015  . PTSD (post-traumatic stress disorder) 09/09/2015  . Agitation 09/09/2015  . Depressed   . Chronic pain     Past Surgical History:  Procedure Laterality Date  . BACK SURGERY    . HERNIA REPAIR    . KNEE SURGERY    . KNEE SURGERY    . prostectomy N/A     Prior to Admission medications   Medication Sig Start Date End Date Taking? Authorizing Provider  divalproex (DEPAKOTE ER) 500 MG 24 hr tablet Take 2 tablets (1,000 mg total) by mouth 2 (two) times daily. Patient not taking: Reported on 04/05/2019 02/07/19   Clapacs, Madie Reno, MD  DULoxetine (CYMBALTA) 30 MG capsule Take 1 capsule  (30 mg total) by mouth daily. 02/07/19   Clapacs, Madie Reno, MD  lisinopril (ZESTRIL) 10 MG tablet Take 1 tablet (10 mg total) by mouth daily. Patient not taking: Reported on 04/05/2019 02/07/19   Clapacs, Madie Reno, MD  naproxen (NAPROSYN) 500 MG tablet Take 1 tablet (500 mg total) by mouth 2 (two) times daily as needed for moderate pain. Patient not taking: Reported on 04/05/2019 02/07/19   Clapacs, Madie Reno, MD  pravastatin (PRAVACHOL) 80 MG tablet Take 1 tablet (80 mg total) by mouth at bedtime. Patient not taking: Reported on 04/05/2019 02/07/19   Clapacs, Madie Reno, MD  thiamine 100 MG tablet Take 1 tablet (100 mg total) by mouth daily. Patient not taking: Reported on 03/03/2019 02/07/19   Clapacs, Madie Reno, MD    Allergies Codeine, Gabapentin, Lisinopril, and Penicillins  No family history on file.  Social History Social History   Tobacco Use  . Smoking status: Never Smoker  . Smokeless tobacco: Never Used  Substance Use Topics  . Alcohol use: Yes  . Drug use: Not Currently    Review of Systems Unable to obtain due to uncooperativeness and alcohol use.  ____________________________________________   PHYSICAL EXAM:  VITAL SIGNS: ED Triage Vitals [04/08/19 1645]  Enc Vitals Group     BP 136/78     Pulse Rate 87     Resp 16     Temp 98.2 F (36.8 C)     Temp Source Oral     SpO2 95 %  Weight      Height      Head Circumference      Peak Flow      Pain Score 0   Constitutional: Awake and alert. Appears intoxicated. Yelling. Cussing.  Eyes: Conjunctivae are normal.  ENT      Head: Normocephalic and atraumatic.      Nose: No congestion/rhinnorhea.      Mouth/Throat: Mucous membranes are moist.      Neck: No stridor. Hematological/Lymphatic/Immunilogical: No cervical lymphadenopathy. Cardiovascular: Normal rate, regular rhythm.  No murmurs, rubs, or gallops. Respiratory: Normal respiratory effort without tachypnea nor retractions. Breath sounds are clear and equal  bilaterally. No wheezes/rales/rhonchi. Gastrointestinal: Soft and non tender. No rebound. No guarding.  Genitourinary: Deferred Musculoskeletal: Normal range of motion in all extremities. No lower extremity edema. Neurologic: Appears intoxicated. Moving all extremities.  Skin:  Skin is warm, dry and intact. No rash noted. Psychiatric: Yelling. Cussing.   ____________________________________________    LABS (pertinent positives/negatives)  None  ____________________________________________   EKG  None  ____________________________________________    RADIOLOGY  None  ____________________________________________   PROCEDURES  Procedures  ____________________________________________   INITIAL IMPRESSION / ASSESSMENT AND PLAN / ED COURSE  Pertinent labs & imaging results that were available during my care of the patient were reviewed by me and considered in my medical decision making (see chart for details).   Patient presented to the emergency department via EMS.  Patient is in his normal state of agitation and intoxication.  He states he only took 2 trazodone.  At this time do not think that that is a significant overdose.  Patient is certainly at at his baseline from when he has been in the emergency department in the past.  Will plan on discharging.  ____________________________________________   FINAL CLINICAL IMPRESSION(S) / ED DIAGNOSES  Final diagnoses:  Alcohol abuse     Note: This dictation was prepared with Dragon dictation. Any transcriptional errors that result from this process are unintentional     Nance Pear, MD 04/08/19 1739

## 2019-04-09 ENCOUNTER — Emergency Department
Admission: EM | Admit: 2019-04-09 | Discharge: 2019-04-10 | Disposition: A | Discharge status: Home / Self Care | Payer: No Typology Code available for payment source | Attending: Student in an Organized Health Care Education/Training Program | Admitting: Student in an Organized Health Care Education/Training Program

## 2019-04-09 ENCOUNTER — Other Ambulatory Visit: Payer: Self-pay

## 2019-04-09 DIAGNOSIS — I10 Essential (primary) hypertension: Secondary | ICD-10-CM | POA: Insufficient documentation

## 2019-04-09 DIAGNOSIS — F329 Major depressive disorder, single episode, unspecified: Secondary | ICD-10-CM | POA: Diagnosis not present

## 2019-04-09 DIAGNOSIS — Z79899 Other long term (current) drug therapy: Secondary | ICD-10-CM | POA: Insufficient documentation

## 2019-04-09 DIAGNOSIS — Y906 Blood alcohol level of 120-199 mg/100 ml: Secondary | ICD-10-CM | POA: Insufficient documentation

## 2019-04-09 DIAGNOSIS — Z046 Encounter for general psychiatric examination, requested by authority: Secondary | ICD-10-CM | POA: Diagnosis present

## 2019-04-09 DIAGNOSIS — F1010 Alcohol abuse, uncomplicated: Secondary | ICD-10-CM | POA: Diagnosis not present

## 2019-04-09 LAB — CBC
HCT: 41.8 % (ref 39.0–52.0)
Hemoglobin: 12.4 g/dL — ABNORMAL LOW (ref 13.0–17.0)
MCH: 20.6 pg — ABNORMAL LOW (ref 26.0–34.0)
MCHC: 29.7 g/dL — ABNORMAL LOW (ref 30.0–36.0)
MCV: 69.4 fL — ABNORMAL LOW (ref 80.0–100.0)
Platelets: 280 10*3/uL (ref 150–400)
RBC: 6.02 MIL/uL — ABNORMAL HIGH (ref 4.22–5.81)
RDW: 19.8 % — ABNORMAL HIGH (ref 11.5–15.5)
WBC: 8.2 10*3/uL (ref 4.0–10.5)
nRBC: 0 % (ref 0.0–0.2)

## 2019-04-09 LAB — COMPREHENSIVE METABOLIC PANEL
ALT: 17 U/L (ref 0–44)
AST: 21 U/L (ref 15–41)
Albumin: 3.9 g/dL (ref 3.5–5.0)
Alkaline Phosphatase: 74 U/L (ref 38–126)
Anion gap: 12 (ref 5–15)
BUN: 24 mg/dL — ABNORMAL HIGH (ref 8–23)
CO2: 19 mmol/L — ABNORMAL LOW (ref 22–32)
Calcium: 9.4 mg/dL (ref 8.9–10.3)
Chloride: 109 mmol/L (ref 98–111)
Creatinine, Ser: 1.26 mg/dL — ABNORMAL HIGH (ref 0.61–1.24)
GFR calc Af Amer: >60 mL/min (ref >60)
GFR calc non Af Amer: 57 mL/min — ABNORMAL LOW (ref >60)
Glucose, Bld: 122 mg/dL — ABNORMAL HIGH (ref 70–99)
Potassium: 3.5 mmol/L (ref 3.5–5.1)
Sodium: 140 mmol/L (ref 135–145)
Total Bilirubin: 0.7 mg/dL (ref 0.3–1.2)
Total Protein: 7.1 g/dL (ref 6.5–8.1)

## 2019-04-09 LAB — ETHANOL: Alcohol, Ethyl (B): 191 mg/dL — ABNORMAL HIGH (ref <10)

## 2019-04-09 NOTE — Consult Note (Signed)
[place] [facility] Consult   Reason for Consult: Psych evaluation Referring Physician:  Dr. Quentin Cornwall Patient Identification: Omar Davis MRN:  [id number] Principal Diagnosis: <principal problem not specified> Diagnosis:  Active Problems:   * No active hospital problems. *   Total Time spent with patient: 30 minutes  Subjective:   Omar Davis is a 68 y.o. male patient admitted with "feeling of PTSD".  HPI:  Omar Davis is a 68 year old male  that presents to [facility] in handcuffs brought in by [employer].  Omar Davis is sitting on his bed on approach. He is pleasant and engaging during the interview.  He is  AOx4 and does not appear to be in any physical or mental distress.  He has an extensive history with ED visits, as such he was just seen in the ER yesterday intoxicated and suicidal ideations. During this assessment, patient states he wants to go home and does not have any thoughts of harming himself but does appear intoxicated.  He is able to ambulate. Denies any any other complaints.  After thorough evaluation and review of information currently presented on assessment of Omar Davis, there is insufficient findings to indicate patient meets criteria for involuntary commitment or require an inpatient level of care. Omar Davis is alert/oriented x4, organized; mood congruent with affect; and denies suicidal/self-harm/homicidal ideation, psychosis, and paranoia. At this time He is not significantly impaired, psychotic, or manic on exam.   A detailed risk assessment has been completed based on clinical exam and individual risk factors.  Patient acute suicide risk is low; and a safety plan has been created jointly which involves patient following up outpatient care to address feelings of PTSD and triggers.  Patient contracts for safety and has no plan of harming himself or anyone else.          Past Psychiatric History: Yes   Risk to Self:   Risk to  Others:   Prior Inpatient Therapy:   Prior Outpatient Therapy:    Past Medical History:  Past Medical History:  Diagnosis Date  . Chronic pain   . Depressed   . Hypertension   . PTSD (post-traumatic stress disorder)   . TBI (traumatic brain injury) [facility])     Past Surgical History:  Procedure Laterality Date  . BACK SURGERY    . HERNIA REPAIR    . KNEE SURGERY    . KNEE SURGERY    . prostectomy N/A    Family History: History reviewed. No pertinent family history. Family Psychiatric  History: unknown Social History:  Social History   Substance and Sexual Activity  Alcohol Use Yes     Social History   Substance and Sexual Activity  Drug Use Not Currently    Social History   Socioeconomic History  . Marital status: Legally Separated    Spouse name: Not on file  . Number of children: Not on file  . Years of education: Not on file  . Highest education level: Not on file  Occupational History  . Not on file  Social Needs  . Financial resource strain: Not on file  . Food insecurity    Worry: Not on file    Inability: Not on file  . Transportation needs    Medical: Not on file    Non-medical: Not on file  Tobacco Use  . Smoking status: Never Smoker  . Smokeless tobacco: Never Used  Substance and Sexual Activity  . Alcohol use: Yes  .  Drug use: Not Currently  . Sexual activity: Not Currently  Lifestyle  . Physical activity    Days per week: Not on file    Minutes per session: Not on file  . Stress: Not on file  Relationships  . Social Herbalist on phone: Not on file    Gets together: Not on file    Attends religious service: Not on file    Active member of club or organization: Not on file    Attends meetings of clubs or organizations: Not on file    Relationship status: Not on file  Other Topics Concern  . Not on file  Social History Narrative  . Not on file   Additional Social History:    Allergies:   Allergies  Allergen Reactions   . Codeine Hives and Rash  . Gabapentin Other (See Comments) and Anaphylaxis    seizures Other reaction(s): Other (See Comments) seizures Anaphylactic Shock   . Lisinopril Swelling    Lip Swelling   . Penicillins Rash    Other reaction(s): Other (See Comments) Other Reaction: Not Assessed Rash     Labs:  Results for orders placed or performed during the hospital encounter of 04/09/19 (from the past 48 hour(s))  Comprehensive metabolic panel     Status: Abnormal   Collection Time: 04/09/19  7:06 PM  Result Value Ref Range   Sodium 140 135 - 145 mmol/L   Potassium 3.5 3.5 - 5.1 mmol/L   Chloride 109 98 - 111 mmol/L   CO2 19 (L) 22 - 32 mmol/L   Glucose, Bld 122 (H) 70 - 99 mg/dL   BUN 24 (H) 8 - 23 mg/dL   Creatinine, Ser 1.26 (H) 0.61 - 1.24 mg/dL   Calcium 9.4 8.9 - 10.3 mg/dL   Total Protein 7.1 6.5 - 8.1 g/dL   Albumin 3.9 3.5 - 5.0 g/dL   AST 21 15 - 41 U/L   ALT 17 0 - 44 U/L   Alkaline Phosphatase 74 38 - 126 U/L   Total Bilirubin 0.7 0.3 - 1.2 mg/dL   GFR calc non Af Amer 57 (L) >60 mL/min   GFR calc Af Amer >60 >60 mL/min   Anion gap 12 5 - 15    Comment: Performed at [facility], [address] [place], [place] [address]  Ethanol     Status: Abnormal   Collection Time: 04/09/19  7:06 PM  Result Value Ref Range   Alcohol, Ethyl (B) 191 (H) <10 mg/dL    Comment: (NOTE) Lowest detectable limit for serum alcohol is 10 mg/dL. For medical purposes only. Performed at [facility], [address] [place], [place] [address]   cbc     Status: Abnormal   Collection Time: 04/09/19  7:06 PM  Result Value Ref Range   WBC 8.2 4.0 - 10.5 K/uL   RBC 6.02 (H) 4.22 - 5.81 MIL/uL   Hemoglobin 12.4 (L) 13.0 - 17.0 g/dL   HCT 41.8 39.0 - 52.0 %   MCV 69.4 (L) 80.0 - 100.0 fL   MCH 20.6 (L) 26.0 - 34.0 pg   MCHC 29.7 (L) 30.0 - 36.0 g/dL   RDW 19.8 (H) 11.5 - 15.5 %   Platelets 280 150 - 400 K/uL   nRBC 0.0 0.0 - 0.2 %    Comment: Performed at  [facility], [address] [place],  [address]    No current facility-administered medications for this encounter.  Current Outpatient Medications  Medication Sig Dispense Refill  . divalproex (DEPAKOTE ER) 500 MG 24 hr tablet Take 2 tablets (1,000 mg total) by mouth 2 (two) times daily. (Patient not taking: Reported on 04/05/2019) 120 tablet 0  . DULoxetine (CYMBALTA) 30 MG capsule Take 1 capsule (30 mg total) by mouth daily. 30 capsule 0  . lisinopril (ZESTRIL) 10 MG tablet Take 1 tablet (10 mg total) by mouth daily. (Patient not taking: Reported on 04/05/2019) 30 tablet 0  . naproxen (NAPROSYN) 500 MG tablet Take 1 tablet (500 mg total) by mouth 2 (two) times daily as needed for moderate pain. (Patient not taking: Reported on 04/05/2019) 60 tablet 0  . pravastatin (PRAVACHOL) 80 MG tablet Take 1 tablet (80 mg total) by mouth at bedtime. (Patient not taking: Reported on 04/05/2019) 30 tablet 0  . thiamine 100 MG tablet Take 1 tablet (100 mg total) by mouth daily. (Patient not taking: Reported on 03/03/2019) 30 tablet 0    Musculoskeletal: Strength & Muscle Tone: within normal limits Gait & Station: normal Patient leans: N/A  Psychiatric Specialty Exam: Physical Exam  Nursing note and vitals reviewed. Constitutional: He is oriented to person, place, and time. He appears well-developed.  HENT:  Head: Normocephalic.  Eyes: Pupils are equal, round, and reactive to light.  Neck: Normal range of motion.  Respiratory: Effort normal and breath sounds normal.  Musculoskeletal: Normal range of motion.  Neurological: He is alert and oriented to person, place, and time.  Skin: Skin is warm and dry.  Psychiatric: He has a normal mood and affect. His speech is normal and behavior is normal. Judgment and thought content normal. Cognition and memory are normal.    Review of Systems  Psychiatric/Behavioral: Negative.   All other systems reviewed and are negative.   Blood  pressure 136/63, pulse 100, temperature 98.4 F (36.9 C), temperature source Oral, resp. rate 20, SpO2 99 %.There is no height or weight on file to calculate BMI.  General Appearance: Disheveled  Eye Contact:  Good  Speech:  Normal Rate  Volume:  Normal  Mood:  NA  Affect:  Appropriate and Congruent  Thought Process:  Coherent  Orientation:  Full (Time, Place, and Person)  Thought Content:  WDL  Suicidal Thoughts:  No  Homicidal Thoughts:  No  Memory:  NA Immediate;   Good  Judgement:  Fair  Insight:  Fair  Psychomotor Activity:  Normal  Concentration:  Concentration: Good  Recall:  Good  Fund of Knowledge:  Good  Language:  NA  Akathisia:  NA  Handed:  Right  AIMS (if indicated):     Assets:  Communication Skills Desire for Improvement Resilience  ADL's:  Intact  Cognition:  WNL  Sleep:        Treatment Plan Summary: Plan patient does not meet inpatient criterior.  Disposition: No evidence of imminent risk to self or others at present.   Patient does not meet criteria for psychiatric inpatient admission. Discussed crisis plan, support from social network, calling 911, coming to the Emergency Department, and calling Suicide Hotline.  Deloria Lair, NP 04/09/2019 8:55 PM

## 2019-04-09 NOTE — ED Notes (Signed)
Patient currently resting in bed. EDP and security spoke with patient when brought to room due to agitation. Patient calmed at once.

## 2019-04-09 NOTE — ED Provider Notes (Signed)
[facility] Emergency Department Provider Note    First MD Initiated Contact with Patient 04/09/19 1928     (approximate)  I have reviewed the triage vital signs and the nursing notes.   HISTORY  Chief Complaint Psychiatric Evaluation    HPI Omar Davis is a 68 y.o. male extensive past medical history as listed below just seen in the ER yesterday with alcohol intoxication presents to the ER under IVC by [facility] [employer] due to suicidal ideation stating that he is having flashbacks.  On my evaluation patient states he wants to go home and does not have any thoughts of harming himself but does appear intoxicated.  He is able to ambulate.  Denies any any other complaints.    Past Medical History:  Diagnosis Date  . Chronic pain   . Depressed   . Hypertension   . PTSD (post-traumatic stress disorder)   . TBI (traumatic brain injury) [facility])    History reviewed. No pertinent family history. Past Surgical History:  Procedure Laterality Date  . BACK SURGERY    . HERNIA REPAIR    . KNEE SURGERY    . KNEE SURGERY    . prostectomy N/A    Patient Active Problem List   Diagnosis Date Noted  . Iron deficiency anemia 04/08/2019  . Alcohol intoxication with delirium ([place])   . Sepsis ([place]) 02/08/2019  . Essential hypertension 02/05/2019  . Alcohol-induced mood disorder ([place]) 01/24/2019  . Alcohol dependence with intoxication, uncomplicated ([place]) [id number]  . Major depressive disorder, recurrent severe without psychotic features ([place]) 08/24/2018  . Suicidal ideation 09/25/2015  . Alcohol abuse 09/09/2015  . PTSD (post-traumatic stress disorder) 09/09/2015  . Agitation 09/09/2015  . Depressed   . Chronic pain       Prior to Admission medications   Medication Sig Start Date End Date Taking? Authorizing Provider  divalproex (DEPAKOTE ER) 500 MG 24 hr tablet Take 2 tablets (1,000 mg total) by mouth 2 (two) times daily. Patient not  taking: Reported on 04/05/2019 02/07/19   Clapacs, Madie Reno, MD  DULoxetine (CYMBALTA) 30 MG capsule Take 1 capsule (30 mg total) by mouth daily. 02/07/19   Clapacs, Madie Reno, MD  lisinopril (ZESTRIL) 10 MG tablet Take 1 tablet (10 mg total) by mouth daily. Patient not taking: Reported on 04/05/2019 02/07/19   Clapacs, Madie Reno, MD  naproxen (NAPROSYN) 500 MG tablet Take 1 tablet (500 mg total) by mouth 2 (two) times daily as needed for moderate pain. Patient not taking: Reported on 04/05/2019 02/07/19   Clapacs, Madie Reno, MD  pravastatin (PRAVACHOL) 80 MG tablet Take 1 tablet (80 mg total) by mouth at bedtime. Patient not taking: Reported on 04/05/2019 02/07/19   Clapacs, Madie Reno, MD  thiamine 100 MG tablet Take 1 tablet (100 mg total) by mouth daily. Patient not taking: Reported on 03/03/2019 02/07/19   Clapacs, Madie Reno, MD    Allergies Codeine, Gabapentin, Lisinopril, and Penicillins    Social History Social History   Tobacco Use  . Smoking status: Never Smoker  . Smokeless tobacco: Never Used  Substance Use Topics  . Alcohol use: Yes  . Drug use: Not Currently    Review of Systems Patient denies headaches, rhinorrhea, blurry vision, numbness, shortness of breath, chest pain, edema, cough, abdominal pain, nausea, vomiting, diarrhea, dysuria, fevers, rashes or hallucinations unless otherwise stated above in HPI. ____________________________________________   PHYSICAL EXAM:  VITAL SIGNS: Vitals:   04/09/19 1911  BP: 136/63  Pulse: 100  Resp: 20  Temp: 98.4 F (36.9 C)  SpO2: 99%    Constitutional: Alert and oriented.  Eyes: Conjunctivae are normal.  Head: Atraumatic. Nose: No congestion/rhinnorhea. Mouth/Throat: Mucous membranes are moist.   Neck: No stridor. Painless ROM.  Cardiovascular: Normal rate, regular rhythm. Grossly normal heart sounds.  Good peripheral circulation. Respiratory: Normal respiratory effort.  No retractions. Lungs CTAB. Gastrointestinal: Soft and  nontender. No distention. No abdominal bruits. No CVA tenderness. Genitourinary:  Musculoskeletal: No lower extremity tenderness nor edema.  No joint effusions. Neurologic:  Normal speech and language. No gross focal neurologic deficits are appreciated. No facial droop Skin:  Skin is warm, dry and intact. No rash noted. Psychiatric: agitated, denies SI or HI  ____________________________________________   LABS (all labs ordered are listed, but only abnormal results are displayed)  No results found for this or any previous visit (from the past 24 hour(s)). ____________________________________________ ____________________________________________  [id number]   ____________________________________________   PROCEDURES  Procedure(s) performed:  Procedures    Critical Care performed: no ____________________________________________   INITIAL IMPRESSION / ASSESSMENT AND PLAN / ED COURSE  Pertinent labs & imaging results that were available during my care of the patient were reviewed by me and considered in my medical decision making (see chart for details).   DDX: intoxication, delirium, ptsd, substance abuse, si  Omar Davis is a 68 y.o. who presents to the ED with alcohol intoxication with delirium.  Similar presentation to previous.  He is now denying SI but as he is intoxicated and came in under IVC will continue IVC for psych consultation.     The patient was evaluated in Emergency Department today for the symptoms described in the history of present illness. He/she was evaluated in the context of the global COVID-19 pandemic, which necessitated consideration that the patient might be at risk for infection with the SARS-CoV-2 virus that causes COVID-19. Institutional protocols and algorithms that pertain to the evaluation of patients at risk for COVID-19 are in a state of rapid change based on information released by regulatory bodies including the CDC and federal and  state organizations. These policies and algorithms were followed during the patient's care in the ED.  As part of my medical decision making, I reviewed the following data within the [facility] notes reviewed and incorporated, Labs reviewed, notes from prior ED visits and  Controlled Substance Database   ____________________________________________   FINAL CLINICAL IMPRESSION(S) / ED DIAGNOSES  Final diagnoses:  Alcohol abuse      NEW MEDICATIONS STARTED DURING THIS VISIT:  New Prescriptions   No medications on file     Note:  This document was prepared using Dragon voice recognition software and may include unintentional dictation errors.    Merlyn Lot, MD 04/09/19 2239

## 2019-04-09 NOTE — ED Triage Notes (Signed)
Pt presents in handcuffs to triage in police custody pending IVC paperwork per police. Reports "PTSD flashbacks" as reason of visit. + ETOH per officer. Pt cussing and yelling at this time.

## 2019-04-10 DIAGNOSIS — F1010 Alcohol abuse, uncomplicated: Secondary | ICD-10-CM

## 2019-04-10 NOTE — ED Notes (Signed)
Patient denies SI/HI/AVH. Patient states he wants to go home and can pay for a taxi home. [employer] called.

## 2019-04-10 NOTE — ED Notes (Signed)
Taxi service called to get an ETA. Driver stated 25 mins.   Patient made aware of ETA and advised to just just to get a little rest before they arrive.

## 2019-04-10 NOTE — ED Provider Notes (Signed)
-----------------------------------------   12:15 AM on 04/10/2019 -----------------------------------------   Blood pressure 136/63, pulse 100, temperature 98.4 F (36.9 C), temperature source Oral, resp. rate 20, SpO2 99 %.  The patient is calm and cooperative at this time.  He was brought into the emergency department under involuntary commitment due to intoxication and aggressive behavior.  However he is well-known to the ED and currently he is clinically sober, ambulatory without difficulty, calm and cooperative and wanting to go home.  He currently does not meet any criteria for involuntary commitment and I revoked the IVC order.  He will be sent home either with a sober adult or as he usually departs from the emergency department, and a taxi.  I gave my usual and customary return precautions.   Hinda Kehr, MD 04/10/19 [phone]

## 2019-04-11 ENCOUNTER — Telehealth: Payer: Self-pay | Admitting: *Deleted

## 2019-04-11 NOTE — Telephone Encounter (Signed)
Called pt to let him know that the authorization staff has reached Dr. Jory Ee office and pt has not been to see md in a while and the referral to hematology -[facility] will not be covered because it was not approved by [facility] prior to coming. LuAnn gave me the cost for pt using his [employer] medicare and it is 80/20 plan. I told pt that his insurance will pay 80 % and he would be responsible for 20 %. The venofer is 426.00 for each dose and he will have 5 doses. Every time pt comes into facility there is a fee 227.00.  These figures do not include his labs and md fee that he had from last week. The pt. Says that after he pays 160.00 for taxi then he pays nothing else for all treatments. He wants to go to [place]. He asked me if I [facility]. He states that his primary md is dr Jory Ee. I will call his pcp and try to get an appt for him

## 2019-04-12 ENCOUNTER — Telehealth: Payer: Self-pay | Admitting: *Deleted

## 2019-04-12 ENCOUNTER — Encounter: Payer: Self-pay | Admitting: *Deleted

## 2019-04-12 NOTE — Telephone Encounter (Signed)
Called pt to let him know that I called Dr. Elesa Massed at [facility] and they gave me appt dat eo dec 1 at 8:30 and it will be telephone call. After that speak with you then they will set up a hematology visit at [facility] so that pt can get scheduled for iron treatments. The pt. Was appreciative and will be ready for the appt for 12/1

## 2019-04-12 NOTE — Telephone Encounter (Signed)
I just had the call with pt yest at 4:54 pm and I hae not called [facility] yet. I will try to get it done today and let him know

## 2019-04-12 NOTE — Telephone Encounter (Signed)
Patient called asking about appointment at [facility] for his Iron infusions. He requests a return call [phone]. (I do see that this was only discussed late yesterday with him)

## 2019-04-24 ENCOUNTER — Ambulatory Visit: Payer: No Typology Code available for payment source

## 2019-04-29 ENCOUNTER — Ambulatory Visit: Payer: No Typology Code available for payment source

## 2019-05-01 ENCOUNTER — Ambulatory Visit: Payer: No Typology Code available for payment source

## 2019-05-06 ENCOUNTER — Ambulatory Visit: Payer: No Typology Code available for payment source

## 2019-05-08 ENCOUNTER — Ambulatory Visit: Payer: No Typology Code available for payment source

## 2019-05-29 ENCOUNTER — Other Ambulatory Visit: Payer: Self-pay

## 2019-05-29 ENCOUNTER — Emergency Department
Admission: EM | Admit: 2019-05-29 | Discharge: 2019-05-30 | Disposition: A | Discharge status: Home / Self Care | Payer: Medicare HMO | Attending: Emergency Medicine | Admitting: Emergency Medicine

## 2019-05-29 DIAGNOSIS — R451 Restlessness and agitation: Secondary | ICD-10-CM | POA: Diagnosis present

## 2019-05-29 DIAGNOSIS — F1094 Alcohol use, unspecified with alcohol-induced mood disorder: Secondary | ICD-10-CM | POA: Diagnosis present

## 2019-05-29 DIAGNOSIS — G8929 Other chronic pain: Secondary | ICD-10-CM | POA: Diagnosis present

## 2019-05-29 DIAGNOSIS — Y908 Blood alcohol level of 240 mg/100 ml or more: Secondary | ICD-10-CM | POA: Insufficient documentation

## 2019-05-29 DIAGNOSIS — R45851 Suicidal ideations: Secondary | ICD-10-CM

## 2019-05-29 DIAGNOSIS — F332 Major depressive disorder, recurrent severe without psychotic features: Secondary | ICD-10-CM | POA: Diagnosis present

## 2019-05-29 DIAGNOSIS — I10 Essential (primary) hypertension: Secondary | ICD-10-CM | POA: Diagnosis present

## 2019-05-29 DIAGNOSIS — F1010 Alcohol abuse, uncomplicated: Secondary | ICD-10-CM | POA: Diagnosis present

## 2019-05-29 DIAGNOSIS — F10920 Alcohol use, unspecified with intoxication, uncomplicated: Secondary | ICD-10-CM

## 2019-05-29 DIAGNOSIS — F32A Depression, unspecified: Secondary | ICD-10-CM | POA: Diagnosis present

## 2019-05-29 DIAGNOSIS — D509 Iron deficiency anemia, unspecified: Secondary | ICD-10-CM | POA: Diagnosis present

## 2019-05-29 DIAGNOSIS — F10921 Alcohol use, unspecified with intoxication delirium: Secondary | ICD-10-CM | POA: Diagnosis present

## 2019-05-29 DIAGNOSIS — A419 Sepsis, unspecified organism: Secondary | ICD-10-CM | POA: Diagnosis present

## 2019-05-29 DIAGNOSIS — F4310 Post-traumatic stress disorder, unspecified: Secondary | ICD-10-CM | POA: Diagnosis present

## 2019-05-29 DIAGNOSIS — Z008 Encounter for other general examination: Secondary | ICD-10-CM

## 2019-05-29 DIAGNOSIS — F329 Major depressive disorder, single episode, unspecified: Secondary | ICD-10-CM | POA: Diagnosis present

## 2019-05-29 DIAGNOSIS — Z79899 Other long term (current) drug therapy: Secondary | ICD-10-CM | POA: Insufficient documentation

## 2019-05-29 DIAGNOSIS — F10220 Alcohol dependence with intoxication, uncomplicated: Secondary | ICD-10-CM | POA: Diagnosis present

## 2019-05-29 DIAGNOSIS — F10120 Alcohol abuse with intoxication, uncomplicated: Secondary | ICD-10-CM | POA: Insufficient documentation

## 2019-05-29 DIAGNOSIS — F1020 Alcohol dependence, uncomplicated: Secondary | ICD-10-CM | POA: Diagnosis present

## 2019-05-29 LAB — COMPREHENSIVE METABOLIC PANEL
ALT: 21 U/L (ref 0–44)
AST: 27 U/L (ref 15–41)
Albumin: 4.1 g/dL (ref 3.5–5.0)
Alkaline Phosphatase: 92 U/L (ref 38–126)
Anion gap: 11 (ref 5–15)
BUN: 23 mg/dL (ref 8–23)
CO2: 24 mmol/L (ref 22–32)
Calcium: 9 mg/dL (ref 8.9–10.3)
Chloride: 105 mmol/L (ref 98–111)
Creatinine, Ser: 1.15 mg/dL (ref 0.61–1.24)
GFR calc Af Amer: >60 mL/min (ref >60)
GFR calc non Af Amer: >60 mL/min (ref >60)
Glucose, Bld: 103 mg/dL — ABNORMAL HIGH (ref 70–99)
Potassium: 3.9 mmol/L (ref 3.5–5.1)
Sodium: 140 mmol/L (ref 135–145)
Total Bilirubin: 0.5 mg/dL (ref 0.3–1.2)
Total Protein: 7 g/dL (ref 6.5–8.1)

## 2019-05-29 LAB — CBC
HCT: 44.5 % (ref 39.0–52.0)
Hemoglobin: 13.6 g/dL (ref 13.0–17.0)
MCH: 22.4 pg — ABNORMAL LOW (ref 26.0–34.0)
MCHC: 30.6 g/dL (ref 30.0–36.0)
MCV: 73.4 fL — ABNORMAL LOW (ref 80.0–100.0)
Platelets: 228 10*3/uL (ref 150–400)
RBC: 6.06 MIL/uL — ABNORMAL HIGH (ref 4.22–5.81)
RDW: 22.5 % — ABNORMAL HIGH (ref 11.5–15.5)
WBC: 8.7 10*3/uL (ref 4.0–10.5)
nRBC: 0 % (ref 0.0–0.2)

## 2019-05-29 LAB — ETHANOL: Alcohol, Ethyl (B): 253 mg/dL — ABNORMAL HIGH (ref <10)

## 2019-05-29 LAB — SALICYLATE LEVEL: Salicylate Lvl: <7 mg/dL — ABNORMAL LOW (ref 7.0–30.0)

## 2019-05-29 LAB — ACETAMINOPHEN LEVEL: Acetaminophen (Tylenol), Serum: <10 ug/mL — ABNORMAL LOW (ref 10–30)

## 2019-05-29 NOTE — ED Notes (Signed)
Pt ambulated to bathroom 

## 2019-05-29 NOTE — ED Notes (Signed)
Pt ambulated to bathroom without assistance.  Back to bed

## 2019-05-29 NOTE — Consult Note (Signed)
[facility] Consult   Reason for Consult: Psych evaluation Referring Physician:  Dr. Quentin Cornwall Patient Identification: Vence Bahn MRN:  [id number] Principal Diagnosis: <principal problem not specified> Diagnosis:  Active Problems:   Depressed   Chronic pain   Alcohol abuse   PTSD (post-traumatic stress disorder)   Agitation   Suicidal ideation   Alcohol dependence with intoxication, uncomplicated ([place])   Major depressive disorder, recurrent severe without psychotic features ([place])   Alcohol-induced mood disorder ([place])   Essential hypertension   Sepsis ([place])   Alcohol intoxication with delirium ([place])   Iron deficiency anemia   Total Time spent with patient: 30 minutes  Subjective: " I am okay." Prish Trimper is a 69 y.o. male patient presented to [facility] ED via law enforcement under involuntary commitment status (IVC). It was reported that he was found in his yard face down; he admitted to drinking a 1/5th of liquor today and is wanting to die, yelling and cussing.  During the initial patient assessment, he is voiced that he is not suicidal. The patient's ETOH level is 253mg /dl.  Per the patient assessment by this provider, he denies suicidal ideation and self-injurious behavior. The patient was seen face-to-face by this provider; chart reviewed and consulted with Dr. Jari Pigg on 05/29/2019 due to the patient's care. It was discussed with the EDP that the patient does not meet criteria to be admitted to the psychiatric inpatient admission. This behavior is a continuous behavior of the patient. The patient is alert and oriented x2-3 but very intoxicated; he is calm, cooperative, and mood-congruent with an affect on evaluation. The patient does not appear to be responding to internal or external stimuli. Neither is the patient presenting with any delusional thinking. The patient denies auditory or visual hallucinations. The patient denies any suicidal, homicidal, or  self-harm ideations. The patient is not presenting with any psychotic or paranoid behaviors. During an encounter with the patient, he was able to answer questions appropriately.   Plan: The patient is not a safety risk to himself. The patient will be observed overnight and discharge in the morning once he has sober up.   HPI: Per Dr. Jari Pigg; Zerrick Urwin is a 70 y.o. male with TBI, PTSD, depression who comes in under IVC.  Patient drank 1/5 of liquor today and states that he wants to die.  Patient was IVC by his wife.  Patient is intoxicated and unable to get full HPI but he denies any other medical concerns from what I can tell.  Per the triage note patient was found laying outside.   Past Psychiatric History: Yes   Risk to Self:   No Risk to Others:   No Prior Inpatient Therapy:   Yes Prior Outpatient Therapy:   Yes  Past Medical History:  Past Medical History:  Diagnosis Date  . Chronic pain   . Depressed   . Hypertension   . PTSD (post-traumatic stress disorder)   . TBI (traumatic brain injury) [facility])     Past Surgical History:  Procedure Laterality Date  . BACK SURGERY    . HERNIA REPAIR    . KNEE SURGERY    . KNEE SURGERY    . prostectomy N/A    Family History: No family history on file. Family Psychiatric  History: unknown Social History:  Social History   Substance and Sexual Activity  Alcohol Use Yes     Social History   Substance and Sexual Activity  Drug Use Not Currently  Social History   Socioeconomic History  . Marital status: Legally Separated    Spouse name: Not on file  . Number of children: Not on file  . Years of education: Not on file  . Highest education level: Not on file  Occupational History  . Not on file  Tobacco Use  . Smoking status: Never Smoker  . Smokeless tobacco: Never Used  Substance and Sexual Activity  . Alcohol use: Yes  . Drug use: Not Currently  . Sexual activity: Not Currently  Other Topics Concern  . Not on  file  Social History Narrative  . Not on file   Social Determinants of Health   Financial Resource Strain:   . Difficulty of Paying Living Expenses: Not on file  Food Insecurity:   . Worried About Charity fundraiser in the Last Year: Not on file  . Ran Out of Food in the Last Year: Not on file  Transportation Needs:   . Lack of Transportation (Medical): Not on file  . Lack of Transportation (Non-Medical): Not on file  Physical Activity:   . Days of Exercise per Week: Not on file  . Minutes of Exercise per Session: Not on file  Stress:   . Feeling of Stress : Not on file  Social Connections:   . Frequency of Communication with Friends and Family: Not on file  . Frequency of Social Gatherings with Friends and Family: Not on file  . Attends Religious Services: Not on file  . Active Member of Clubs or Organizations: Not on file  . Attends Archivist Meetings: Not on file  . Marital Status: Not on file   Additional Social History:    Allergies:   Allergies  Allergen Reactions  . Codeine Hives and Rash  . Gabapentin Other (See Comments) and Anaphylaxis    seizures Other reaction(s): Other (See Comments) seizures Anaphylactic Shock   . Lisinopril Swelling    Lip Swelling   . Penicillins Rash    Other reaction(s): Other (See Comments) Other Reaction: Not Assessed Rash     Labs:  Results for orders placed or performed during the hospital encounter of 05/29/19 (from the past 48 hour(s))  Comprehensive metabolic panel     Status: Abnormal   Collection Time: 05/29/19  7:08 PM  Result Value Ref Range   Sodium 140 135 - 145 mmol/L   Potassium 3.9 3.5 - 5.1 mmol/L   Chloride 105 98 - 111 mmol/L   CO2 24 22 - 32 mmol/L   Glucose, Bld 103 (H) 70 - 99 mg/dL   BUN 23 8 - 23 mg/dL   Creatinine, Ser 1.15 0.61 - 1.24 mg/dL   Calcium 9.0 8.9 - 10.3 mg/dL   Total Protein 7.0 6.5 - 8.1 g/dL   Albumin 4.1 3.5 - 5.0 g/dL   AST 27 15 - 41 U/L   ALT 21 0 - 44 U/L    Alkaline Phosphatase 92 38 - 126 U/L   Total Bilirubin 0.5 0.3 - 1.2 mg/dL   GFR calc non Af Amer >60 >60 mL/min   GFR calc Af Amer >60 >60 mL/min   Anion gap 11 5 - 15    Comment: Performed at [facility], [address] [place], [place] [address]  Ethanol     Status: Abnormal   Collection Time: 05/29/19  7:08 PM  Result Value Ref Range   Alcohol, Ethyl (B) 253 (H) <10 mg/dL    Comment: (NOTE) Lowest detectable limit  for serum alcohol is 10 mg/dL. For medical purposes only. Performed at [facility], [address] [place], [place] [id number]   Salicylate level     Status: Abnormal   Collection Time: 05/29/19  7:08 PM  Result Value Ref Range   Salicylate Lvl [id number] (L) 7.0 - 30.0 mg/dL    Comment: Performed at [facility], [address] [place], [place] [address]  Acetaminophen level     Status: Abnormal   Collection Time: 05/29/19  7:08 PM  Result Value Ref Range   Acetaminophen (Tylenol), Serum <10 (L) 10 - 30 ug/mL    Comment: (NOTE) Therapeutic concentrations vary significantly. A range of 10-30 ug/mL  may be an effective concentration for many patients. However, some  are best treated at concentrations outside of this range. Acetaminophen concentrations >150 ug/mL at 4 hours after ingestion  and >50 ug/mL at 12 hours after ingestion are often associated with  toxic reactions. Performed at [facility], [address] [place], [place] [address]   cbc     Status: Abnormal   Collection Time: 05/29/19  7:08 PM  Result Value Ref Range   WBC 8.7 4.0 - 10.5 K/uL   RBC 6.06 (H) 4.22 - 5.81 MIL/uL   Hemoglobin 13.6 13.0 - 17.0 g/dL   HCT 44.5 39.0 - 52.0 %   MCV 73.4 (L) 80.0 - 100.0 fL   MCH 22.4 (L) 26.0 - 34.0 pg   MCHC 30.6 30.0 - 36.0 g/dL   RDW 22.5 (H) 11.5 - 15.5 %   Platelets 228 150 - 400 K/uL   nRBC 0.0 0.0 - 0.2 %    Comment: Performed at [facility], [address] [place],  [address]    No  current facility-administered medications for this encounter.   Current Outpatient Medications  Medication Sig Dispense Refill  . divalproex (DEPAKOTE ER) 500 MG 24 hr tablet Take 2 tablets (1,000 mg total) by mouth 2 (two) times daily. (Patient not taking: Reported on 04/05/2019) 120 tablet 0  . DULoxetine (CYMBALTA) 30 MG capsule Take 1 capsule (30 mg total) by mouth daily. 30 capsule 0  . lisinopril (ZESTRIL) 10 MG tablet Take 1 tablet (10 mg total) by mouth daily. (Patient not taking: Reported on 04/05/2019) 30 tablet 0  . naproxen (NAPROSYN) 500 MG tablet Take 1 tablet (500 mg total) by mouth 2 (two) times daily as needed for moderate pain. (Patient not taking: Reported on 04/05/2019) 60 tablet 0  . pravastatin (PRAVACHOL) 80 MG tablet Take 1 tablet (80 mg total) by mouth at bedtime. (Patient not taking: Reported on 04/05/2019) 30 tablet 0  . thiamine 100 MG tablet Take 1 tablet (100 mg total) by mouth daily. (Patient not taking: Reported on 03/03/2019) 30 tablet 0    Musculoskeletal: Strength & Muscle Tone: within normal limits Gait & Station: normal Patient leans: N/A  Psychiatric Specialty Exam: Physical Exam  Nursing note and vitals reviewed. Constitutional: He is oriented to person, place, and time. He appears well-developed.  HENT:  Head: Normocephalic.  Eyes: Pupils are equal, round, and reactive to light.  Respiratory: Effort normal and breath sounds normal.  Musculoskeletal:        General: Normal range of motion.     Cervical back: Normal range of motion.  Neurological: He is alert and oriented to person, place, and time.  Skin: Skin is warm and dry.  Psychiatric: He has a normal mood and affect. His speech is normal and behavior  is normal. Judgment and thought content normal. Cognition and memory are normal.    Review of Systems  Psychiatric/Behavioral: Negative.   All other systems reviewed and are negative.   Blood pressure 118/82, pulse (!) 110, temperature 97.7  F (36.5 C), temperature source Oral, resp. rate 18, height 5\' 8"  (1.727 m), weight 74.8 kg, SpO2 98 %.Body mass index is 25.09 kg/m.  General Appearance: Disheveled  Eye Contact:  Good  Speech:  Normal Rate  Volume:  Normal  Mood:  NA  Affect:  Appropriate and Congruent  Thought Process:  Coherent  Orientation:  Full (Time, Place, and Person)  Thought Content:  WDL  Suicidal Thoughts:  No  Homicidal Thoughts:  No  Memory:  NA Immediate;   Good  Judgement:  Fair  Insight:  Fair  Psychomotor Activity:  Normal  Concentration:  Concentration: Good  Recall:  Good  Fund of Knowledge:  Good  Language:  NA  Akathisia:  NA  Handed:  Right  AIMS (if indicated):     Assets:  Communication Skills Desire for Improvement Resilience  ADL's:  Intact  Cognition:  WNL  Sleep:        Treatment Plan Summary: Plan The patient does not meet criteria for psychiatric inpatient admission.  Disposition: No evidence of imminent risk to self or others at present.   Patient does not meet criteria for psychiatric inpatient admission. Supportive therapy provided about ongoing stressors.  Caroline Sauger, NP 05/29/2019 11:10 PM

## 2019-05-29 NOTE — ED Provider Notes (Signed)
[facility] Emergency Department Provider Note  ____________________________________________   First MD Initiated Contact with Patient 05/29/19 1859     (approximate)  I have reviewed the triage vital signs and the nursing notes.   HISTORY  Chief Complaint Alcohol Problem    HPI Omar Davis is a 69 y.o. male with TBI, PTSD, depression who comes in under IVC.  Patient drank 1/5 of liquor today and states that he wants to die.  Patient was IVC by his wife.  Patient is intoxicated and unable to get full HPI but he denies any other medical concerns from what I can tell.  Per the triage note patient was found laying outside.            Past Medical History:  Diagnosis Date  . Chronic pain   . Depressed   . Hypertension   . PTSD (post-traumatic stress disorder)   . TBI (traumatic brain injury) [facility])     Patient Active Problem List   Diagnosis Date Noted  . Iron deficiency anemia 04/08/2019  . Alcohol intoxication with delirium ([place])   . Sepsis ([place]) 02/08/2019  . Essential hypertension 02/05/2019  . Alcohol-induced mood disorder ([place]) 01/24/2019  . Alcohol dependence with intoxication, uncomplicated ([place]) [id number]  . Major depressive disorder, recurrent severe without psychotic features ([place]) 08/24/2018  . Suicidal ideation 09/25/2015  . Alcohol abuse 09/09/2015  . PTSD (post-traumatic stress disorder) 09/09/2015  . Agitation 09/09/2015  . Depressed   . Chronic pain     Past Surgical History:  Procedure Laterality Date  . BACK SURGERY    . HERNIA REPAIR    . KNEE SURGERY    . KNEE SURGERY    . prostectomy N/A     Prior to Admission medications   Medication Sig Start Date End Date Taking? Authorizing Provider  divalproex (DEPAKOTE ER) 500 MG 24 hr tablet Take 2 tablets (1,000 mg total) by mouth 2 (two) times daily. Patient not taking: Reported on 04/05/2019 02/07/19   Clapacs, Madie [place], MD  DULoxetine (CYMBALTA) 30 MG capsule  Take 1 capsule (30 mg total) by mouth daily. 02/07/19   Clapacs, Madie Reno, MD  lisinopril (ZESTRIL) 10 MG tablet Take 1 tablet (10 mg total) by mouth daily. Patient not taking: Reported on 04/05/2019 02/07/19   Clapacs, Madie Reno, MD  naproxen (NAPROSYN) 500 MG tablet Take 1 tablet (500 mg total) by mouth 2 (two) times daily as needed for moderate pain. Patient not taking: Reported on 04/05/2019 02/07/19   Clapacs, Madie Reno, MD  pravastatin (PRAVACHOL) 80 MG tablet Take 1 tablet (80 mg total) by mouth at bedtime. Patient not taking: Reported on 04/05/2019 02/07/19   Clapacs, Madie Reno, MD  thiamine 100 MG tablet Take 1 tablet (100 mg total) by mouth daily. Patient not taking: Reported on 03/03/2019 02/07/19   Clapacs, Madie Reno, MD    Allergies Codeine, Gabapentin, Lisinopril, and Penicillins  No family history on file.  Social History Social History   Tobacco Use  . Smoking status: Never Smoker  . Smokeless tobacco: Never Used  Substance Use Topics  . Alcohol use: Yes  . Drug use: Not Currently      Review of Systems Patient appears intoxicated so unable to get full review of systems ____________________________________________   PHYSICAL EXAM:  VITAL SIGNS: ED Triage Vitals  Enc Vitals Group     BP 05/29/19 1839 118/82     Pulse Rate 05/29/19 1839 (!) 110  Resp 05/29/19 1839 18     Temp 05/29/19 1839 97.7 F (36.5 C)     Temp Source 05/29/19 1839 Oral     SpO2 05/29/19 1839 98 %     Weight 05/29/19 1840 165 lb (74.8 kg)     Height 05/29/19 1840 5\' 8"  (1.727 m)     Head Circumference --      Peak Flow --      Pain Score --      Pain Loc --      Pain Edu? --      Excl. in [place]? --     Constitutional: Patient is alert and yelling and appears intoxicated. Eyes: Conjunctivae are red.  EOMI. Head: Atraumatic. Nose: No congestion/rhinnorhea. Mouth/Throat: Mucous membranes are moist.   Neck: No stridor. Trachea Midline. FROM Cardiovascular: Normal rate, regular rhythm. Grossly  normal heart sounds.  Good peripheral circulation. Respiratory: Normal respiratory effort.  No retractions. Lungs CTAB. Gastrointestinal: Soft and nontender. No distention. No abdominal bruits.  Musculoskeletal: No lower extremity tenderness nor edema.  No joint effusions. Neurologic: Patient is intoxicated, slurring his speech, moving all extremities well.  Does follow commands. Skin:  Skin is warm, dry and intact. No rash noted. Psychiatric: Intoxicated, agitated, some passive SI GU: Deferred   ____________________________________________   LABS (all labs ordered are listed, but only abnormal results are displayed)  Labs Reviewed  CBC - Abnormal; Notable for the following components:      Result Value   RBC 6.06 (*)    MCV 73.4 (*)    MCH 22.4 (*)    RDW 22.5 (*)    All other components within normal limits  COMPREHENSIVE METABOLIC PANEL  ETHANOL  SALICYLATE LEVEL  ACETAMINOPHEN LEVEL  URINE DRUG SCREEN, QUALITATIVE ([facility] ONLY)   ____________________________________________   INITIAL IMPRESSION / ASSESSMENT AND PLAN / ED COURSE  Gaberial Bois was evaluated in Emergency Department on 05/29/2019 for the symptoms described in the history of present illness. He was evaluated in the context of the global COVID-19 pandemic, which necessitated consideration that the patient might be at risk for infection with the SARS-CoV-2 virus that causes COVID-19. Institutional protocols and algorithms that pertain to the evaluation of patients at risk for COVID-19 are in a state of rapid change based on information released by regulatory bodies including the CDC and federal and state organizations. These policies and algorithms were followed during the patient's care in the ED.    Pt is without any acute medical complaints. No exam findings to suggest medical cause of current presentation.  Although patient was found laying outside the ground I have very low suspicion for intracranial  hemorrhage or cervical fracture.  Although patient is agitated and appears intoxicated he is following commands, squeeze both of his hands are equal strength.  Able to lift both of his legs off the ground.  He is got no evidence of hematoma or trauma to his head.  He is a frequent flyer here and is acting at his baseline when he comes in for his intoxication.  We will continue to closely monitor if CT scan is necessary but at this time I do not think it is necessary.    Will order psychiatric screening labs and discuss further w/ psychiatric service.  D/d includes but is not limited to psychiatric disease, behavioral/personality disorder, inadequate socioeconomic support, medical.  Based on HPI, exam, unremarkable labs, no concern for acute medical problem at this time. No rigidity, clonus, hyperthermia, focal neurologic  deficit, diaphoresis, tachycardia, meningismus, ataxia, gait abnormality or other finding to suggest this visit represents a non-psychiatric problem. Screening labs reviewed.    Given this, pt medically cleared, to be dispositioned per Psych.   ____________________________________________   FINAL CLINICAL IMPRESSION(S) / ED DIAGNOSES   Final diagnoses:  Evaluation by psychiatric service required  Alcoholic intoxication without complication ([place])      MEDICATIONS GIVEN DURING THIS VISIT:  Medications - No data to display   ED Discharge Orders    None       Note:  This document was prepared using Dragon voice recognition software and may include unintentional dictation errors.   Vanessa [place], MD 05/29/19 1944

## 2019-05-29 NOTE — ED Notes (Signed)
Officer brought in patients black wallet, wallet added to patients one bag.

## 2019-05-29 NOTE — ED Notes (Signed)
Pt. Brought in under IVC for yelling SI comments while under the influence of alcohol.  Pt. Calm and cooperative at this time.

## 2019-05-29 NOTE — ED Triage Notes (Signed)
Pt comes into the ED via home under IVC  , officer found the pt in his yard face down , pt admitted to drinking a 1/5th of liquor today and is wanting to die, yelling and cussing, states he had dealt with him in the past and usually able to get the pt to calm down and go lay down and go to sleep. Pt has a blind wife at home.Marland Kitchen

## 2019-05-29 NOTE — BH Assessment (Signed)
TTS and Psyc NP presented for assessment, patient was unable to be assessed due to being asleep.

## 2019-05-30 ENCOUNTER — Other Ambulatory Visit: Payer: Self-pay

## 2019-05-30 ENCOUNTER — Encounter: Payer: Self-pay | Admitting: Emergency Medicine

## 2019-05-30 ENCOUNTER — Emergency Department
Admission: EM | Admit: 2019-05-30 | Discharge: 2019-05-31 | Disposition: A | Discharge status: Home / Self Care | Payer: Medicare HMO | Source: Home / Self Care | Attending: Emergency Medicine | Admitting: Emergency Medicine

## 2019-05-30 DIAGNOSIS — R451 Restlessness and agitation: Secondary | ICD-10-CM

## 2019-05-30 DIAGNOSIS — F10929 Alcohol use, unspecified with intoxication, unspecified: Secondary | ICD-10-CM

## 2019-05-30 MED ORDER — DIPHENHYDRAMINE HCL 50 MG/ML IJ SOLN
INTRAMUSCULAR | Status: AC
  Administered 2019-05-30: 50 mg via INTRAMUSCULAR
  Filled 2019-05-30: qty 1

## 2019-05-30 MED ORDER — HALOPERIDOL LACTATE 5 MG/ML IJ SOLN
INTRAMUSCULAR | Status: AC
  Administered 2019-05-30: 10 mg via INTRAMUSCULAR
  Filled 2019-05-30: qty 2

## 2019-05-30 MED ORDER — HALOPERIDOL LACTATE 5 MG/ML IJ SOLN: 10.0000 mg | Freq: Once | INTRAMUSCULAR | Status: AC

## 2019-05-30 MED ORDER — DIPHENHYDRAMINE HCL 50 MG/ML IJ SOLN: 50.0000 mg | Freq: Once | INTRAMUSCULAR | Status: AC

## 2019-05-30 NOTE — Consult Note (Signed)
  Patient reassessed this morning.  Patient adamantly denies any suicidal ideation.  He denies any audiovisual hallucinations, mania, paranoia, or other signs of psychosis.  Patient is able to reasonably explain his presentation as in the context of alcohol intoxication.  Patient says "I get drunk and I say stupid things the police"  Patient well-known to writer as presenting very similarly multiple times.  IVC rescinded, patient to be discharged

## 2019-05-30 NOTE — ED Provider Notes (Signed)
[facility] Emergency Department Provider Note  ____________________________________________  Time seen: Approximately 4:14 PM  I have reviewed the triage vital signs and the nursing notes.   HISTORY  Chief Complaint Confusion   Level 5 Caveat: Portions of the History and Physical including HPI and review of systems are unable to be completely obtained due to patient being a poor historian   HPI Bryam Bandt is a 69 y.o. male sent to the ED due to confusion.  Patient  is well-known to this department and has a long history of alcohol abuse daily.  On arrival he is agitated, screaming and shouting and combative.  He is not able to participate in his examination or treatment, not able to make safe decisions at this time.  He was seen earlier today and found to be medically stable, discharged home in a taxi.  He called medics to the room he was staying at at a [employer], and when they arrived he reportedly could not figure out how to open the door and so they had to break it down.   Past Medical History:  Diagnosis Date  . Chronic pain   . Depressed   . Hypertension   . PTSD (post-traumatic stress disorder)   . TBI (traumatic brain injury) [facility])      Patient Active Problem List   Diagnosis Date Noted  . Iron deficiency anemia 04/08/2019  . Alcohol intoxication with delirium ([place])   . Sepsis ([place]) 02/08/2019  . Essential hypertension 02/05/2019  . Alcohol-induced mood disorder ([place]) 01/24/2019  . Alcohol dependence with intoxication, uncomplicated ([place]) [id number]  . Major depressive disorder, recurrent severe without psychotic features ([place]) 08/24/2018  . Suicidal ideation 09/25/2015  . Alcohol abuse 09/09/2015  . PTSD (post-traumatic stress disorder) 09/09/2015  . Agitation 09/09/2015  . Depressed   . Chronic pain      Past Surgical History:  Procedure Laterality Date  . BACK SURGERY    . HERNIA REPAIR    . KNEE SURGERY    . KNEE  SURGERY    . prostectomy N/A      Prior to Admission medications   Medication Sig Start Date End Date Taking? Authorizing Provider  divalproex (DEPAKOTE ER) 500 MG 24 hr tablet Take 2 tablets (1,000 mg total) by mouth 2 (two) times daily. Patient not taking: Reported on 04/05/2019 02/07/19   Clapacs, Madie Reno, MD  DULoxetine (CYMBALTA) 30 MG capsule Take 1 capsule (30 mg total) by mouth daily. Patient not taking: Reported on 05/30/2019 02/07/19   Clapacs, Madie Reno, MD  lisinopril (ZESTRIL) 10 MG tablet Take 1 tablet (10 mg total) by mouth daily. Patient not taking: Reported on 04/05/2019 02/07/19   Clapacs, Madie Reno, MD  naproxen (NAPROSYN) 500 MG tablet Take 1 tablet (500 mg total) by mouth 2 (two) times daily as needed for moderate pain. Patient not taking: Reported on 04/05/2019 02/07/19   Clapacs, Madie Reno, MD  pravastatin (PRAVACHOL) 80 MG tablet Take 1 tablet (80 mg total) by mouth at bedtime. Patient not taking: Reported on 04/05/2019 02/07/19   Clapacs, Madie Reno, MD  thiamine 100 MG tablet Take 1 tablet (100 mg total) by mouth daily. Patient not taking: Reported on 03/03/2019 02/07/19   Clapacs, Madie Reno, MD     Allergies Codeine, Gabapentin, Lisinopril, and Penicillins   No family history on file.  Social History Social History   Tobacco Use  . Smoking status: Never Smoker  . Smokeless tobacco: Never Used  Substance  Use Topics  . Alcohol use: Yes  . Drug use: Not Currently    Review of Systems Level 5 Caveat: Portions of the History and Physical including HPI and review of systems are unable to be completely obtained due to patient being a poor historian   Constitutional:   No known fever.  ENT:   No rhinorrhea. Cardiovascular:   No chest pain or syncope. Respiratory:   No dyspnea or cough. Gastrointestinal:   Negative for abdominal pain, vomiting and diarrhea.  Musculoskeletal:   Negative for focal pain or swelling ____________________________________________   PHYSICAL  EXAM:  VITAL SIGNS: ED Triage Vitals  Enc Vitals Group     BP      Pulse      Resp      Temp      Temp src      SpO2      Weight      Height      Head Circumference      Peak Flow      Pain Score      Pain Loc      Pain Edu?      Excl. in [place]?     Vital signs reviewed, nursing assessments reviewed.   Constitutional:   Awake, combative. Non-toxic appearance.  Smells of alcohol Eyes:   Conjunctivae are normal. EOMI.  ENT      Head:   Normocephalic and atraumatic.  No otorrhea.  No battle sign or raccoon eyes      Nose:   No congestion/rhinnorhea.  No epistaxis      Mouth/Throat:   MMM, no pharyngeal erythema. No peritonsillar mass.       Neck:   No meningismus. Full ROM.  No midline spinal tenderness Hematological/Lymphatic/Immunilogical:   No cervical lymphadenopathy. Cardiovascular:   RRR. Symmetric bilateral radial and DP pulses.  No murmurs. Cap refill less than 2 seconds. Respiratory:   Normal respiratory effort without tachypnea/retractions. Breath sounds are clear and equal bilaterally. No wheezes/rales/rhonchi. Gastrointestinal:   Soft and nontender. Non distended. There is no CVA tenderness.  No rebound, rigidity, or guarding. Musculoskeletal:   Normal range of motion in all extremities. No joint effusions.  No lower extremity tenderness.  No edema. Neurologic:   Normal speech and language.  Motor grossly intact. No acute focal neurologic deficits are appreciated.  Skin:    Skin is warm, dry and intact. No rash noted.  No petechiae, purpura, or bullae.  ____________________________________________    LABS (pertinent positives/negatives) (all labs ordered are listed, but only abnormal results are displayed) Labs Reviewed - No data to display ____________________________________________   EKG    ____________________________________________    RADIOLOGY  No results found.  ____________________________________________   PROCEDURES .Critical  Care Performed by: Carrie Mew, MD Authorized by: Carrie Mew, MD   Critical care provider statement:    Critical care time (minutes):  35   Critical care time was exclusive of:  Separately billable procedures and treating other patients   Critical care was necessary to treat or prevent imminent or life-threatening deterioration of the following conditions:  Toxidrome and CNS failure or compromise   Critical care was time spent personally by me on the following activities:  Development of treatment plan with patient or surrogate, discussions with consultants, evaluation of patient's response to treatment, examination of patient, obtaining history from patient or surrogate, ordering and performing treatments and interventions, ordering and review of laboratory studies, ordering and review of radiographic studies, pulse oximetry, re-evaluation  of patient's condition and review of old charts    ____________________________________________    CLINICAL IMPRESSION / ASSESSMENT AND PLAN / ED COURSE  Medications ordered in the ED: Medications  haloperidol lactate (HALDOL) injection 10 mg (10 mg Intramuscular Given 05/30/19 1614)  diphenhydrAMINE (BENADRYL) injection 50 mg (50 mg Intramuscular Given 05/30/19 1614)    Pertinent labs & imaging results that were available during my care of the patient were reviewed by me and considered in my medical decision making (see chart for details).   Faaris Collett was evaluated in Emergency Department on 05/30/2019 for the symptoms described in the history of present illness. He was evaluated in the context of the global COVID-19 pandemic, which necessitated consideration that the patient might be at risk for infection with the SARS-CoV-2 virus that causes COVID-19. Institutional protocols and algorithms that pertain to the evaluation of patients at risk for COVID-19 are in a state of rapid change based on information released by regulatory bodies  including the CDC and federal and state organizations. These policies and algorithms were followed during the patient's care in the ED.   Patient arrives combative and agitated, apparently intoxicated with alcohol which is a frequent occurrence for him.  To maintain a safe environment for the patient and others in the emergency department, was given Haldol 10 mg intramuscular and Benadryl 50 mg intramuscular on arrival to the treatment room to help calm him due to his severe agitation.  We will observe him in the emergency department until he is awake and clinically sober.  No signs of trauma.  He had a full lab panel done yesterday which was unremarkable except for an alcohol level of 250.  No reason to repeat labs today.   ----------------------------------------- 6:48 PM on 05/30/2019 -----------------------------------------  After medication administration, patient calm and sleeping.  IVC paperwork initiated by police reports that patient had made some vague statements about death when he picked him up.  This is typical of the patient while intoxicated, and we will reassess his possible suicidal thoughts once he is sober.      ____________________________________________   FINAL CLINICAL IMPRESSION(S) / ED DIAGNOSES    Final diagnoses:  Alcoholic intoxication with complication [facility])  Agitation     ED Discharge Orders    None      Portions of this note were generated with dragon dictation software. Dictation errors may occur despite best attempts at proofreading.   Carrie Mew, MD 05/30/19 [phone]

## 2019-05-30 NOTE — BH Assessment (Signed)
Writer spoke with patient to complete updated/ reassessment. Patient denies SI/HI and AV/H. He contribute his alcohol use for the reason he was brought to the ER.

## 2019-05-30 NOTE — Care Management (Signed)
RN CM: anticipating discharge home. [facility]

## 2019-05-30 NOTE — ED Notes (Signed)
Pt. Up using bathroom, pt. Returned to room with steady gait. 

## 2019-05-30 NOTE — ED Provider Notes (Signed)
Patient seen and cleared by psychiatry. He shows no signs of withdrawal. Medically stable.   Duffy Bruce, MD 05/30/19 1100

## 2019-05-30 NOTE — ED Notes (Signed)
Pt changed into behavorial clothing by this tech and Estate manager/land agent with the assistance from officers [place], [place].

## 2019-05-30 NOTE — ED Notes (Signed)
Pt arrives back to room, immediately starts screaming "Fuck me! I want some pussy! Fuck me! I just want some pussy!" Pt immediately becomes angry with staff. Pt initially in forensic restraints placed by [employer] who picked patient up from [place] due to being kicked out for the evening by his girlfriend. Pt noted to be labile with emotions. EDP aware as patient can be heard screaming through the ER, "Fuck me! Fuck me! Fuck you!" Pt noted to be physically and verbally aggressive with [employer] and ED staff, getting in faces and yelling at staff. Pt assisted to the bed by [employer] and this RN. Medications administered per MD order. Pt appears to be calm when staff not in the room.

## 2019-05-30 NOTE — ED Notes (Signed)
Pt. Currently sleeping in bed, pt. Was given medication in the afternoon.

## 2019-05-30 NOTE — ED Notes (Signed)
Pt given phone to make phone call.  

## 2019-05-30 NOTE — ED Notes (Signed)
Omar Davis called to pick patient up.

## 2019-05-30 NOTE — ED Notes (Signed)
Pt requesting cab to go home, states he will pay for it.

## 2019-05-30 NOTE — BH Assessment (Signed)
Assessment Note  Omar Davis is an 69 y.o. male presenting to [facility] ED under IVC by [facility] due to being found in his yard face down, patient at that time admitted to drinking 1/5th of liquor and wanting to die, yelling and cussing. During assessment patient was calm, cooperative and alert. Patient reports the reason he is presenting to ED was for his "drinking." Patient reports "I drink about 4-5 times a week." Patient denies any knowledge of reporting SI last night. Patient reports he drinks "about 1/2 bottle of vodka" and reports at most he will drink "a quarter." Patient admits that he last drank last night on 05/29/2019. Patient reports his age of first use being at 56. 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 Patient reports having nightmares "every couple of months" and is currently seeing a psychiatrist at [facility] in [place] for his PTSD. Patient reports 1 other time where he was hospitalized for his PTSD in May of 2020 at the [facility] hospital. Patient reports that his current sleep is good as well as his appetite. Patient denies current SI/HI/AH/VH and did not appear to be responding to any internal or external stimuli.  Per Psyc NP patient does not currently meet criteria for Inpatient Hospitalization and is recommended for discharge.   Diagnosis: F10.20 Alcohol Use Disorder Severe, Post-Traumatic Stress Disorder  Past Medical History:  Past Medical History:  Diagnosis Date  . Chronic pain   . Depressed   . Hypertension   . PTSD (post-traumatic stress disorder)   . TBI (traumatic brain injury) [facility])     Past Surgical History:  Procedure Laterality Date  . BACK SURGERY    . HERNIA REPAIR    . KNEE SURGERY    . KNEE SURGERY    . prostectomy N/A     Family History: No family history on  file.  Social History:  reports that he has never smoked. He has never used smokeless tobacco. He reports current alcohol use. He reports previous drug use.  Additional Social History:  Alcohol / Drug Use Pain Medications: See MAR Prescriptions: See MAR Over the Counter: See MAR History of alcohol / drug use?: Yes Substance #1 Name of Substance 1: Alcohol 1 - Age of First Use: 17 1 - Amount (size/oz): "1/2 bottle of vodka." 1 - Frequency: "4-5 times a week" 1 - Duration: 49 years 1 - Last Use / Amount: 05/29/2019  CIWA: CIWA-Ar BP: 118/82 Pulse Rate: (!) 110 COWS:    Allergies:  Allergies  Allergen Reactions  . Codeine Hives and Rash  . Gabapentin Other (See Comments) and Anaphylaxis    seizures Other reaction(s): Other (See Comments) seizures Anaphylactic Shock   . Lisinopril Swelling    Lip Swelling   . Penicillins Rash    Other reaction(s): Other (See Comments) Other Reaction: Not Assessed Rash     Home Medications: (Not in a hospital admission)   OB/GYN Status:  No LMP for male patient.  General Assessment Data Location of Assessment: [facility] ED TTS Assessment: In system Is this a Tele or Face-to-Face Assessment?: Face-to-Face Is this an Initial Assessment or a Re-assessment for this encounter?: Initial Assessment Patient Accompanied by:: N/A Language Other than English: No Living Arrangements: Other (Comment)(Private Residence) What gender do you identify as?: Male Marital status: Single Living  Arrangements: Spouse/significant other Can pt return to current living arrangement?: Yes Admission Status: Involuntary Petitioner: Police Is patient capable of signing voluntary admission?: No Referral Source: Other([place] [employer]) Insurance type: Materials engineer Exam ([place]) Medical Exam completed: Yes  Crisis Care Plan Living Arrangements: Spouse/significant other Legal Guardian: Other:(Patient is his own legal guardian) Name of  Psychiatrist: [facility]  Education Status Is patient currently in school?: No Is the patient employed, unemployed or receiving disability?: (Unknown)  Risk to self with the past 6 months Suicidal Ideation: No Has patient been a risk to self within the past 6 months prior to admission? : No Suicidal Intent: No Has patient had any suicidal intent within the past 6 months prior to admission? : No Is patient at risk for suicide?: No Suicidal Plan?: No Has patient had any suicidal plan within the past 6 months prior to admission? : No Access to Means: No What has been your use of drugs/alcohol within the last 12 months?: Alcohol Use Previous Attempts/Gestures: No Triggers for Past Attempts: None known Intentional Self Injurious Behavior: None Family Suicide History: No Recent stressful life event(s): Other (Comment)(None reported) Persecutory voices/beliefs?: No Depression: No Substance abuse history and/or treatment for substance abuse?: Yes Suicide prevention information given to non-admitted patients: Not applicable  Risk to Others within the past 6 months Homicidal Ideation: No Does patient have any lifetime risk of violence toward others beyond the six months prior to admission? : No Thoughts of Harm to Others: No Current Homicidal Intent: No Current Homicidal Plan: No Access to Homicidal Means: No History of harm to others?: No Assessment of Violence: None Noted Does patient have access to weapons?: No Criminal Charges Pending?: No Does patient have a court date: No Is patient on probation?: No  Psychosis Hallucinations: None noted Delusions: None noted  Mental Status Report Appearance/Hygiene: In scrubs Eye Contact: Good Motor Activity: Freedom of movement Speech: Logical/coherent Level of Consciousness: Alert Mood: Pleasant Affect: Appropriate to circumstance Anxiety Level: Minimal Thought Processes: Coherent Judgement: Unimpaired Orientation:  Person, Place, Time, Situation, Appropriate for developmental age Obsessive Compulsive Thoughts/Behaviors: None  Cognitive Functioning Concentration: Normal Memory: Recent Intact, Remote Intact Is patient IDD: No Insight: Good Impulse Control: Fair Appetite: Good Have you had any weight changes? : No Change Sleep: No Change Total Hours of Sleep: 5 Vegetative Symptoms: None  ADLScreening [facility]) Patient's cognitive ability adequate to safely complete daily activities?: Yes Patient able to express need for assistance with ADLs?: Yes Independently performs ADLs?: Yes (appropriate for developmental age)  Prior Inpatient Therapy Prior Inpatient Therapy: No  Prior Outpatient Therapy Prior Outpatient Therapy: Yes Prior Therapy Dates: Currently Prior Therapy Facilty/Provider(s): [facility] Reason for Treatment: PTSD Does patient have an ACCT team?: No Does patient have Intensive In-House Services?  : No Does patient have [facility] services? : No Does patient have P4CC services?: No  ADL Screening (condition at time of admission) Patient's cognitive ability adequate to safely complete daily activities?: Yes Is the patient deaf or have difficulty hearing?: No Does the patient have difficulty seeing, even when wearing glasses/contacts?: No Does the patient have difficulty concentrating, remembering, or making decisions?: No Patient able to express need for assistance with ADLs?: Yes Does the patient have difficulty dressing or bathing?: No Independently performs ADLs?: Yes (appropriate for developmental age) Does the patient have difficulty walking or climbing stairs?: No Weakness of Legs: None Weakness of Arms/Hands: None  Home Assistive Devices/Equipment Home Assistive Devices/Equipment: None  Therapy Consults (therapy consults require a physician order) PT Evaluation Needed: No OT Evalulation Needed: No SLP Evaluation Needed: No Abuse/Neglect  Assessment (Assessment to be complete while patient is alone) Abuse/Neglect Assessment Can Be Completed: Yes Physical Abuse: Denies Verbal Abuse: Denies Sexual Abuse: Denies Exploitation of patient/patient's resources: Denies Self-Neglect: Denies Values / Beliefs Cultural Requests During Hospitalization: None Spiritual Requests During Hospitalization: None Consults Spiritual Care Consult Needed: No Transition of Care Team Consult Needed: No Advance Directives (For Healthcare) Does Patient Have a Medical Advance Directive?: No Would patient like information on creating a medical advance directive?: No - Patient declined          Disposition: Per Psyc NP patient does not currently meet criteria for Inpatient Hospitalization and is recommended for discharge.  Disposition Initial Assessment Completed for this Encounter: Yes  On Site Evaluation by:   Reviewed with Physician:    Leonie Douglas MS LCAS-A 05/30/2019 4:28 AM

## 2019-05-30 NOTE — ED Notes (Signed)
NAD noted at time of D/C. Pt denies questions or concerns. Pt ambulatory to the lobby at this time with steady gait. Pt discharged into a cab.

## 2019-05-30 NOTE — ED Notes (Signed)
Per EDP and Psychiatrist pt okay to go in taxi.

## 2019-05-31 NOTE — ED Provider Notes (Signed)
-----------------------------------------   1:29 PM on 05/31/2019 -----------------------------------------  Blood pressure (!) 145/73, pulse 98, temperature 97.8 F (36.6 C), resp. rate 18, height 5\' 8"  (1.727 m), weight 81.6 kg, SpO2 99 %.  On reevaluation, patient is requesting to be discharged home and currently denies any suicidal or homicidal ideation.  He is well-known to this department with multiple presentations for suicidal threats while intoxicated.  He has been cleared by psychiatry on multiple recent visits, including within the past 24 hours.  I do not feel there is any indication for repeat psychiatric evaluation today as patient is calm and cooperative, contracts for safety.  He will be discharged home and advised to follow-up with PCP as well as outpatient psychiatry for alcohol abuse and depression.    Blake Divine, MD 05/31/19 1330

## 2019-05-31 NOTE — ED Notes (Signed)
Pt given lunch meal tray. 

## 2019-05-31 NOTE — ED Notes (Signed)
BEHAVIORAL HEALTH ROUNDING Patient sleeping: No. Patient alert and oriented: yes Behavior appropriate: Yes.  ; If no, describe:  Nutrition and fluids offered: yes Toileting and hygiene offered: Yes  Sitter present: q15 minute observations and security camera monitoring   

## 2019-05-31 NOTE — ED Notes (Signed)

## 2019-05-31 NOTE — ED Notes (Signed)
He has ambulated to and from the BR with a steady gait  

## 2019-05-31 NOTE — ED Notes (Signed)
Pt transferred into ED BHU room 5    Patient assigned to appropriate care area. Patient oriented to unit/care area: Informed that, for his safety, care areas are designed for safety and monitored by security cameras at all times; Visiting hours  - covid restrictions and phone times explained to patient. Patient verbalizes understanding, and verbal contract for safety obtained.    He denies pain  Assessment completed

## 2019-05-31 NOTE — ED Notes (Signed)
Pt. Up using bathroom, pt. Returned to room with steady gait. 

## 2019-05-31 NOTE — ED Notes (Signed)
ED BHU Is the patient under IVC or is there intent for IVC: Yes.   Is the patient medically cleared: Yes.   Is there vacancy in the ED BHU: Yes.   Is the population mix appropriate for patient: Yes.   Is the patient awaiting placement in inpatient or outpatient setting:  Has the patient had a psychiatric consult: pending Survey of unit performed for contraband, proper placement and condition of furniture, tampering with fixtures in bathroom, shower, and each patient room: Yes.  ; Findings:  APPEARANCE/BEHAVIOR Calm and cooperative NEURO ASSESSMENT Orientation: oriented x3  Denies pain Hallucinations: No.None noted (Hallucinations) denies Speech: Normal Gait: normal RESPIRATORY ASSESSMENT Even  Unlabored respirations  CARDIOVASCULAR ASSESSMENT Pulses equal   regular rate  Skin warm and dry   GASTROINTESTINAL ASSESSMENT no GI complaint EXTREMITIES Full ROM  PLAN OF CARE Provide calm/safe environment. Vital signs assessed twice daily. ED BHU Assessment once each 12-hour shift.  Assure the ED provider has rounded once each shift. Provide and encourage hygiene. Provide redirection as needed. Assess for escalating behavior; address immediately and inform ED provider.  Assess family dynamic and appropriateness for visitation as needed: Yes.  ; If necessary, describe findings:  Educate the patient/family about BHU procedures/visitation: Yes.  ; If necessary, describe findings:

## 2019-06-03 ENCOUNTER — Emergency Department: Payer: Medicare HMO

## 2019-06-03 ENCOUNTER — Encounter: Payer: Self-pay | Admitting: Emergency Medicine

## 2019-06-03 ENCOUNTER — Emergency Department
Admission: EM | Admit: 2019-06-03 | Discharge: 2019-06-03 | Disposition: A | Discharge status: Home / Self Care | Payer: Medicare HMO | Attending: Emergency Medicine | Admitting: Emergency Medicine

## 2019-06-03 ENCOUNTER — Other Ambulatory Visit: Payer: Self-pay

## 2019-06-03 DIAGNOSIS — E860 Dehydration: Secondary | ICD-10-CM | POA: Insufficient documentation

## 2019-06-03 DIAGNOSIS — R002 Palpitations: Secondary | ICD-10-CM | POA: Diagnosis present

## 2019-06-03 DIAGNOSIS — I10 Essential (primary) hypertension: Secondary | ICD-10-CM | POA: Insufficient documentation

## 2019-06-03 DIAGNOSIS — Z79899 Other long term (current) drug therapy: Secondary | ICD-10-CM | POA: Insufficient documentation

## 2019-06-03 LAB — BASIC METABOLIC PANEL
Anion gap: 16 — ABNORMAL HIGH (ref 5–15)
BUN: 50 mg/dL — ABNORMAL HIGH (ref 8–23)
CO2: 16 mmol/L — ABNORMAL LOW (ref 22–32)
Calcium: 9.7 mg/dL (ref 8.9–10.3)
Chloride: 108 mmol/L (ref 98–111)
Creatinine, Ser: 1.43 mg/dL — ABNORMAL HIGH (ref 0.61–1.24)
GFR calc Af Amer: 57 mL/min — ABNORMAL LOW (ref >60)
GFR calc non Af Amer: 49 mL/min — ABNORMAL LOW (ref >60)
Glucose, Bld: 139 mg/dL — ABNORMAL HIGH (ref 70–99)
Potassium: 4 mmol/L (ref 3.5–5.1)
Sodium: 140 mmol/L (ref 135–145)

## 2019-06-03 LAB — CBC
HCT: 48.2 % (ref 39.0–52.0)
Hemoglobin: 14.9 g/dL (ref 13.0–17.0)
MCH: 22.4 pg — ABNORMAL LOW (ref 26.0–34.0)
MCHC: 30.9 g/dL (ref 30.0–36.0)
MCV: 72.6 fL — ABNORMAL LOW (ref 80.0–100.0)
Platelets: 293 10*3/uL (ref 150–400)
RBC: 6.64 MIL/uL — ABNORMAL HIGH (ref 4.22–5.81)
RDW: 23.7 % — ABNORMAL HIGH (ref 11.5–15.5)
WBC: 11.7 10*3/uL — ABNORMAL HIGH (ref 4.0–10.5)
nRBC: 0 % (ref 0.0–0.2)

## 2019-06-03 MED ORDER — SODIUM CHLORIDE 0.9 % IV SOLN
1000.0000 mL | Freq: Once | INTRAVENOUS | Status: AC
  Administered 2019-06-03: 14:00:00 1000 mL via INTRAVENOUS

## 2019-06-03 MED ORDER — SODIUM CHLORIDE 0.9% FLUSH: 3.0000 mL | Freq: Once | INTRAVENOUS | Status: DC

## 2019-06-03 NOTE — ED Triage Notes (Signed)
Pt to ED via POV, pt states that his heart beat is irregular and rapid. Pt states that this happens when he drinks. Pt states that he drink about a quart of vodka last night, has not had anything since then. Pt states that his rapid heart rate started last night and he feels like his HR is still up. Pt is tachy at 109

## 2019-06-03 NOTE — ED Notes (Signed)
Placed on monitor.  Sinus at 98-102/min.  No ectopy seen while I was with patient starting iv and giving fluids.

## 2019-06-03 NOTE — ED Provider Notes (Signed)
[facility] Emergency Department Provider Note   ____________________________________________    I have reviewed the triage vital signs and the nursing notes.   HISTORY  Chief Complaint Palpitations     HPI Omar Davis is a 69 y.o. male with history as noted below, known to our department who presents with complaints of palpitations and fatigue.  He reports "this always happens after heavy drinking ".  He reports significant months of alcohol intake last night.  Denies fevers or chills.  No cough shortness of breath.  No chest pain.  Felt like his heart was "going fast ", now is better and has no complaints.  No psychiatric complaints today  Past Medical History:  Diagnosis Date  . Chronic pain   . Depressed   . Hypertension   . PTSD (post-traumatic stress disorder)   . TBI (traumatic brain injury) [facility])     Patient Active Problem List   Diagnosis Date Noted  . Iron deficiency anemia 04/08/2019  . Alcohol intoxication with delirium ([place])   . Sepsis ([place]) 02/08/2019  . Essential hypertension 02/05/2019  . Alcohol-induced mood disorder ([place]) 01/24/2019  . Alcohol dependence with intoxication, uncomplicated ([place]) [id number]  . Major depressive disorder, recurrent severe without psychotic features ([place]) 08/24/2018  . Suicidal ideation 09/25/2015  . Alcohol abuse 09/09/2015  . PTSD (post-traumatic stress disorder) 09/09/2015  . Agitation 09/09/2015  . Depressed   . Chronic pain     Past Surgical History:  Procedure Laterality Date  . BACK SURGERY    . HERNIA REPAIR    . KNEE SURGERY    . KNEE SURGERY    . prostectomy N/A     Prior to Admission medications   Medication Sig Start Date End Date Taking? Authorizing Provider  amlodipine-atorvastatin (CADUET) 10-10 MG tablet Take 1 tablet by mouth daily.   Yes [provider]  DULoxetine (CYMBALTA) 30 MG capsule Take 1 capsule (30 mg total) by mouth daily. 02/07/19  Yes  Clapacs, Madie Reno, MD  naproxen (NAPROSYN) 500 MG tablet Take 1 tablet (500 mg total) by mouth 2 (two) times daily as needed for moderate pain. 02/07/19  Yes Clapacs, Madie Reno, MD  pravastatin (PRAVACHOL) 80 MG tablet Take 1 tablet (80 mg total) by mouth at bedtime. 02/07/19  Yes Clapacs, Madie Reno, MD  divalproex (DEPAKOTE ER) 500 MG 24 hr tablet Take 2 tablets (1,000 mg total) by mouth 2 (two) times daily. Patient not taking: Reported on 04/05/2019 02/07/19   Clapacs, Madie Reno, MD  lisinopril (ZESTRIL) 10 MG tablet Take 1 tablet (10 mg total) by mouth daily. Patient not taking: Reported on 04/05/2019 02/07/19   Clapacs, Madie Reno, MD  thiamine 100 MG tablet Take 1 tablet (100 mg total) by mouth daily. Patient not taking: Reported on 03/03/2019 02/07/19   Clapacs, Madie Reno, MD     Allergies Codeine, Gabapentin, Lisinopril, and Penicillins  No family history on file.  Social History Social History   Tobacco Use  . Smoking status: Never Smoker  . Smokeless tobacco: Never Used  Substance Use Topics  . Alcohol use: Yes  . Drug use: Not Currently    Review of Systems  Constitutional: No fever/chills Eyes: No visual changes.  ENT: No sore throat. Cardiovascular: As above Respiratory: As above. Gastrointestinal: No abdominal pain.   Genitourinary: Negative for dysuria. Musculoskeletal: Negative for back pain. Skin: Negative for rash. Neurological: Negative for headaches   ____________________________________________   PHYSICAL EXAM:  VITAL SIGNS: ED  Triage Vitals  Enc Vitals Group     BP 06/03/19 1254 (!) 167/92     Pulse Rate 06/03/19 1254 (!) 113     Resp 06/03/19 1254 16     Temp 06/03/19 1255 98.6 F (37 C)     Temp src --      SpO2 06/03/19 1254 99 %     Weight 06/03/19 1252 79.4 kg (175 lb)     Height 06/03/19 1252 1.727 m (5\' 8" )     Head Circumference --      Peak Flow --      Pain Score 06/03/19 1252 0     Pain Loc --      Pain Edu? --      Excl. in [place]? --      Constitutional: Alert and oriented.  Nose: No congestion/rhinnorhea. Mouth/Throat: Mucous membranes are moist.    Cardiovascular: Normal rate, regular rhythm. Grossly normal heart sounds.  Good peripheral circulation. Respiratory: Normal respiratory effort.  No retractions. Gastrointestinal: Soft and nontender. No distention.    Musculoskeletal:   Warm and well perfused Neurologic:  Normal speech and language. No gross focal neurologic deficits are appreciated.  Skin:  Skin is warm, dry and intact. No rash noted. Psychiatric: Mood and affect are normal. Speech and behavior are normal.  ____________________________________________   LABS (all labs ordered are listed, but only abnormal results are displayed)  Labs Reviewed  BASIC METABOLIC PANEL - Abnormal; Notable for the following components:      Result Value   CO2 16 (*)    Glucose, Bld 139 (*)    BUN 50 (*)    Creatinine, Ser 1.43 (*)    GFR calc non Af Amer 49 (*)    GFR calc Af Amer 57 (*)    Anion gap 16 (*)    All other components within normal limits  CBC - Abnormal; Notable for the following components:   WBC 11.7 (*)    RBC 6.64 (*)    MCV 72.6 (*)    MCH 22.4 (*)    RDW 23.7 (*)    All other components within normal limits   ____________________________________________  EKG  ED ECG REPORT I, Lavonia Drafts, the attending physician, personally viewed and interpreted this ECG.  Date: 06/03/2019  Rhythm: Sinus tachycardia QRS Axis: normal Intervals: normal ST/T Wave abnormalities: Nonspecific Narrative Interpretation: no evidence of acute ischemia  ____________________________________________  RADIOLOGY  Chest x-ray unremarkable ____________________________________________   PROCEDURES  Procedure(s) performed: No  Procedures   Critical Care performed: No ____________________________________________   INITIAL IMPRESSION / ASSESSMENT AND PLAN / ED COURSE  Pertinent labs & imaging  results that were available during my care of the patient were reviewed by me and considered in my medical decision making (see chart for details).  Patient well-appearing and in no acute distress, lab work consistent with mild dehydration, elevated anion gap likely related to chronic alcoholism.  Will give IV fluids and reevaluate  Patient feeling much better after IV fluids, he is anxious to leave, he does not want to stay for repeat lab work.  Return precautions discussed with the patient    ____________________________________________   FINAL CLINICAL IMPRESSION(S) / ED DIAGNOSES  Final diagnoses:  Palpitations  Dehydration        Note:  This document was prepared using Dragon voice recognition software and may include unintentional dictation errors.   Lavonia Drafts, MD 06/03/19 1623

## 2019-06-29 ENCOUNTER — Emergency Department
Admission: EM | Admit: 2019-06-29 | Discharge: 2019-06-29 | Disposition: A | Discharge status: Home / Self Care | Payer: No Typology Code available for payment source | Attending: Emergency Medicine | Admitting: Emergency Medicine

## 2019-06-29 ENCOUNTER — Other Ambulatory Visit: Payer: Self-pay

## 2019-06-29 ENCOUNTER — Encounter: Payer: Self-pay | Admitting: Emergency Medicine

## 2019-06-29 DIAGNOSIS — Z5321 Procedure and treatment not carried out due to patient leaving prior to being seen by health care provider: Secondary | ICD-10-CM | POA: Diagnosis not present

## 2019-06-29 DIAGNOSIS — R109 Unspecified abdominal pain: Secondary | ICD-10-CM | POA: Insufficient documentation

## 2019-06-29 LAB — COMPREHENSIVE METABOLIC PANEL
ALT: 25 U/L (ref 0–44)
AST: 30 U/L (ref 15–41)
Albumin: 4 g/dL (ref 3.5–5.0)
Alkaline Phosphatase: 113 U/L (ref 38–126)
Anion gap: 8 (ref 5–15)
BUN: 23 mg/dL (ref 8–23)
CO2: 24 mmol/L (ref 22–32)
Calcium: 9.2 mg/dL (ref 8.9–10.3)
Chloride: 106 mmol/L (ref 98–111)
Creatinine, Ser: 1.16 mg/dL (ref 0.61–1.24)
GFR calc Af Amer: >60 mL/min (ref >60)
GFR calc non Af Amer: >60 mL/min (ref >60)
Glucose, Bld: 125 mg/dL — ABNORMAL HIGH (ref 70–99)
Potassium: 4.1 mmol/L (ref 3.5–5.1)
Sodium: 138 mmol/L (ref 135–145)
Total Bilirubin: 0.7 mg/dL (ref 0.3–1.2)
Total Protein: 7.1 g/dL (ref 6.5–8.1)

## 2019-06-29 LAB — CBC
HCT: 48.4 % (ref 39.0–52.0)
Hemoglobin: 15.1 g/dL (ref 13.0–17.0)
MCH: 24.3 pg — ABNORMAL LOW (ref 26.0–34.0)
MCHC: 31.2 g/dL (ref 30.0–36.0)
MCV: 77.8 fL — ABNORMAL LOW (ref 80.0–100.0)
Platelets: 265 10*3/uL (ref 150–400)
RBC: 6.22 MIL/uL — ABNORMAL HIGH (ref 4.22–5.81)
RDW: 22.1 % — ABNORMAL HIGH (ref 11.5–15.5)
WBC: 7.8 10*3/uL (ref 4.0–10.5)
nRBC: 0 % (ref 0.0–0.2)

## 2019-06-29 LAB — TYPE AND SCREEN
ABO/RH(D): O POS
Antibody Screen: NEGATIVE

## 2019-06-29 NOTE — ED Triage Notes (Signed)
Pt to ED with c/o of "bleeding ulcer". Pt states he has a hx. Pt states abdominal pain/swelling and "blood" in stool.

## 2019-07-01 ENCOUNTER — Telehealth: Payer: Self-pay | Admitting: Emergency Medicine

## 2019-07-01 NOTE — Telephone Encounter (Signed)
Called patient due to lwot to inquire about condition and follow up plans.No answer and voicemail is full. °

## 2019-07-05 ENCOUNTER — Ambulatory Visit: Payer: No Typology Code available for payment source | Admitting: Oncology

## 2019-07-05 ENCOUNTER — Other Ambulatory Visit: Payer: No Typology Code available for payment source

## 2019-08-03 ENCOUNTER — Other Ambulatory Visit: Payer: Self-pay

## 2019-08-03 ENCOUNTER — Emergency Department
Admission: EM | Admit: 2019-08-03 | Discharge: 2019-08-04 | Disposition: A | Discharge status: Home / Self Care | Payer: Medicare HMO | Attending: Emergency Medicine | Admitting: Emergency Medicine

## 2019-08-03 DIAGNOSIS — Y906 Blood alcohol level of 120-199 mg/100 ml: Secondary | ICD-10-CM | POA: Diagnosis not present

## 2019-08-03 DIAGNOSIS — F1094 Alcohol use, unspecified with alcohol-induced mood disorder: Secondary | ICD-10-CM | POA: Diagnosis not present

## 2019-08-03 DIAGNOSIS — F1010 Alcohol abuse, uncomplicated: Secondary | ICD-10-CM

## 2019-08-03 DIAGNOSIS — F1014 Alcohol abuse with alcohol-induced mood disorder: Secondary | ICD-10-CM | POA: Diagnosis not present

## 2019-08-03 DIAGNOSIS — F329 Major depressive disorder, single episode, unspecified: Secondary | ICD-10-CM | POA: Diagnosis not present

## 2019-08-03 DIAGNOSIS — F1020 Alcohol dependence, uncomplicated: Secondary | ICD-10-CM | POA: Diagnosis present

## 2019-08-03 DIAGNOSIS — I10 Essential (primary) hypertension: Secondary | ICD-10-CM | POA: Diagnosis not present

## 2019-08-03 DIAGNOSIS — Z046 Encounter for general psychiatric examination, requested by authority: Secondary | ICD-10-CM | POA: Diagnosis present

## 2019-08-03 DIAGNOSIS — F10220 Alcohol dependence with intoxication, uncomplicated: Secondary | ICD-10-CM | POA: Diagnosis present

## 2019-08-03 LAB — COMPREHENSIVE METABOLIC PANEL
ALT: 26 U/L (ref 0–44)
AST: 26 U/L (ref 15–41)
Albumin: 4.4 g/dL (ref 3.5–5.0)
Alkaline Phosphatase: 114 U/L (ref 38–126)
Anion gap: 13 (ref 5–15)
BUN: 24 mg/dL — ABNORMAL HIGH (ref 8–23)
CO2: 20 mmol/L — ABNORMAL LOW (ref 22–32)
Calcium: 9.4 mg/dL (ref 8.9–10.3)
Chloride: 108 mmol/L (ref 98–111)
Creatinine, Ser: 1.23 mg/dL (ref 0.61–1.24)
GFR calc Af Amer: >60 mL/min (ref >60)
GFR calc non Af Amer: 59 mL/min — ABNORMAL LOW (ref >60)
Glucose, Bld: 102 mg/dL — ABNORMAL HIGH (ref 70–99)
Potassium: 3.8 mmol/L (ref 3.5–5.1)
Sodium: 141 mmol/L (ref 135–145)
Total Bilirubin: 0.8 mg/dL (ref 0.3–1.2)
Total Protein: 7.3 g/dL (ref 6.5–8.1)

## 2019-08-03 LAB — CBC
HCT: 48.2 % (ref 39.0–52.0)
Hemoglobin: 16 g/dL (ref 13.0–17.0)
MCH: 26.4 pg (ref 26.0–34.0)
MCHC: 33.2 g/dL (ref 30.0–36.0)
MCV: 79.5 fL — ABNORMAL LOW (ref 80.0–100.0)
Platelets: 269 10*3/uL (ref 150–400)
RBC: 6.06 MIL/uL — ABNORMAL HIGH (ref 4.22–5.81)
RDW: 18.1 % — ABNORMAL HIGH (ref 11.5–15.5)
WBC: 10.5 10*3/uL (ref 4.0–10.5)
nRBC: 0 % (ref 0.0–0.2)

## 2019-08-03 LAB — ETHANOL: Alcohol, Ethyl (B): 154 mg/dL — ABNORMAL HIGH (ref <10)
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
## 2019-08-03 NOTE — ED Notes (Signed)
Pt vital signs not obtained at this time d/t pt being asleep, pt last vitals were @ 1846, d/t pt hx of inappropriate behavior pt will not be woke up at this time for snack and vitals however, if pt awakes and request snack, it will be provided

## 2019-08-03 NOTE — ED Triage Notes (Signed)
Pt arrives under IVC, arrives in handcuffs with officers. Pt admits to drinking a pint of wine today. Pt denies SI and HI. Pt states "I wish I had just died in the [place] war, they didn't care about anybody." pt yells and curses but is able to calm down when talked to.

## 2019-08-03 NOTE — ED Notes (Signed)
Pt. Laying on bed awake.  Pt. Requesting to go home to his girlfriend.  Pt. Calm and cooperative at this time.

## 2019-08-03 NOTE — ED Provider Notes (Signed)
-----------------------------------------   11:30 PM on 08/03/2019 -----------------------------------------  Assuming care from Dr. Joan Mayans.  In short, Omar Davis is a 69 y.o. male with a chief complaint of alcohol intoxication.  Refer to the original H&P for additional details.  The current plan of care is to reassess when clinically sober. Anticipate reversal of IVC and discharge when appropriate.   Hinda Kehr, MD 08/04/19 [phone]

## 2019-08-03 NOTE — ED Notes (Addendum)
Pt belongings: (all in one bag) Gray socks, black boxer briefs, black tennis shoes, jeans, hawaiian shirt, belt, wallet in jeans (has $55).   Pt kept a pair of glasses.

## 2019-08-03 NOTE — ED Notes (Addendum)
NP talking to patient in room.

## 2019-08-03 NOTE — ED Notes (Signed)
Pt. Given phone to make one phone call to let family know he is here.

## 2019-08-03 NOTE — ED Notes (Signed)
Pt black cell phone placed in belongings bag.

## 2019-08-03 NOTE — Consult Note (Signed)
[facility] Psychiatry Consult   Reason for Consult:  Psych evaluation  Referring Physician:  Dr. Joan Mayans  Patient Identification: Omar Davis MRN:  [id number] Principal Diagnosis: Alcohol-induced mood disorder ([place]) Diagnosis:  Principal Problem:   Alcohol-induced mood disorder ([place]) Active Problems:   Alcohol dependence with intoxication, uncomplicated ([place])   Total Time spent with patient: 45 minutes  Subjective:   Omar Davis is a 69 y.o. male Per er-nurse:   Pt arrives under IVC, arrives in handcuffs with officers. Pt admits to drinking a pint of wine today. Pt denies SI and HI. Pt states "I wish I had just died in the [place] war, they didn't care about anybody." pt yells and curses but is able to calm down when talked to.   HPI:  Omar Davis, 69 y.o., male patient seen face to face by this provider; chart reviewed and consulted with Dr. Joan Mayans  on 08/03/19.  On evaluation Omar Davis reports that he knows that he made some statements that he wishes he could take back. He is remorseful and just requesting to go home.      During evaluation Omar Davis is laying in bed watching tv; he is alert/oriented x 4; calm/cooperative; and mood congruent with affect.  Patient is speaking in a clear tone at moderate volume, and normal pace; with good eye contact.  His thought process is coherent and relevant; There is no indication that he is currently responding to internal/external stimuli or experiencing delusional thought content.  Patient denies suicidal/self-harm/homicidal ideation, psychosis, and paranoia.  Patient has remained calm throughout assessment and has answered questions appropriately.   Recommendation: DC in the am   Past Psychiatric History: Depression and ETOH abuse  Risk to Self:   Risk to Others:   Prior Inpatient Therapy:   Prior Outpatient Therapy:    Past Medical History:  Past Medical History:  Diagnosis Date  . Chronic  pain   . Depressed   . Hypertension   . PTSD (post-traumatic stress disorder)   . TBI (traumatic brain injury) [facility])     Past Surgical History:  Procedure Laterality Date  . BACK SURGERY    . HERNIA REPAIR    . KNEE SURGERY    . KNEE SURGERY    . prostectomy N/A    Family History: History reviewed. No pertinent family history. Family Psychiatric  History: unknown Social History:  Social History   Substance and Sexual Activity  Alcohol Use Yes     Social History   Substance and Sexual Activity  Drug Use Not Currently    Social History   Socioeconomic History  . Marital status: Legally Separated    Spouse name: Not on file  . Number of children: Not on file  . Years of education: Not on file  . Highest education level: Not on file  Occupational History  . Not on file  Tobacco Use  . Smoking status: Never Smoker  . Smokeless tobacco: Never Used  Substance and Sexual Activity  . Alcohol use: Yes  . Drug use: Not Currently  . Sexual activity: Not Currently  Other Topics Concern  . Not on file  Social History Narrative  . Not on file   Social Determinants of Health   Financial Resource Strain:   . Difficulty of Paying Living Expenses:   Food Insecurity:   . Worried About Charity fundraiser in the Last Year:   . [place] in the Last Year:  Transportation Needs:   . Film/video editor (Medical):   Marland Kitchen Lack of Transportation (Non-Medical):   Physical Activity:   . Days of Exercise per Week:   . Minutes of Exercise per Session:   Stress:   . Feeling of Stress :   Social Connections:   . Frequency of Communication with Friends and Family:   . Frequency of Social Gatherings with Friends and Family:   . Attends Religious Services:   . Active Member of Clubs or Organizations:   . Attends Archivist Meetings:   Marland Kitchen Marital Status:    Additional Social History:    Allergies:   Allergies  Allergen Reactions  . Codeine Hives and Rash  .  Gabapentin Other (See Comments) and Anaphylaxis    seizures Other reaction(s): Other (See Comments) seizures Anaphylactic Shock   . Lisinopril Swelling    Lip Swelling   . Penicillins Rash    Other reaction(s): Other (See Comments) Other Reaction: Not Assessed Rash     Labs:  Results for orders placed or performed during the hospital encounter of 08/03/19 (from the past 48 hour(s))  Comprehensive metabolic panel     Status: Abnormal   Collection Time: 08/03/19  6:37 PM  Result Value Ref Range   Sodium 141 135 - 145 mmol/L   Potassium 3.8 3.5 - 5.1 mmol/L   Chloride 108 98 - 111 mmol/L   CO2 20 (L) 22 - 32 mmol/L   Glucose, Bld 102 (H) 70 - 99 mg/dL    Comment: Glucose reference range applies only to samples taken after fasting for at least 8 hours.   BUN 24 (H) 8 - 23 mg/dL   Creatinine, Ser 1.23 0.61 - 1.24 mg/dL   Calcium 9.4 8.9 - 10.3 mg/dL   Total Protein 7.3 6.5 - 8.1 g/dL   Albumin 4.4 3.5 - 5.0 g/dL   AST 26 15 - 41 U/L   ALT 26 0 - 44 U/L   Alkaline Phosphatase 114 38 - 126 U/L   Total Bilirubin 0.8 0.3 - 1.2 mg/dL   GFR calc non Af Amer 59 (L) >60 mL/min   GFR calc Af Amer >60 >60 mL/min   Anion gap 13 5 - 15    Comment: Performed at [facility], [address], [place], [place] [address]  Ethanol     Status: Abnormal   Collection Time: 08/03/19  6:37 PM  Result Value Ref Range   Alcohol, Ethyl (B) 154 (H) <10 mg/dL    Comment: (NOTE) Lowest detectable limit for serum alcohol is 10 mg/dL. For medical purposes only. Performed at [facility], [address] [place], [place] [address]   cbc     Status: Abnormal   Collection Time: 08/03/19  6:37 PM  Result Value Ref Range   WBC 10.5 4.0 - 10.5 K/uL   RBC 6.06 (H) 4.22 - 5.81 MIL/uL   Hemoglobin 16.0 13.0 - 17.0 g/dL   HCT 48.2 39.0 - 52.0 %   MCV 79.5 (L) 80.0 - 100.0 fL   MCH 26.4 26.0 - 34.0 pg   MCHC 33.2 30.0 - 36.0 g/dL   RDW 18.1 (H) 11.5 - 15.5 %   Platelets 269 150 -  400 K/uL   nRBC 0.0 0.0 - 0.2 %    Comment: Performed at [facility], [address] [place], [place] [address]    No current facility-administered medications for this encounter.   Current Outpatient Medications  Medication Sig Dispense  Refill  . amlodipine-atorvastatin (CADUET) 10-10 MG tablet Take 1 tablet by mouth daily.    . divalproex (DEPAKOTE ER) 500 MG 24 hr tablet Take 2 tablets (1,000 mg total) by mouth 2 (two) times daily. (Patient not taking: Reported on 04/05/2019) 120 tablet 0  . DULoxetine (CYMBALTA) 30 MG capsule Take 1 capsule (30 mg total) by mouth daily. 30 capsule 0  . lisinopril (ZESTRIL) 10 MG tablet Take 1 tablet (10 mg total) by mouth daily. (Patient not taking: Reported on 04/05/2019) 30 tablet 0  . naproxen (NAPROSYN) 500 MG tablet Take 1 tablet (500 mg total) by mouth 2 (two) times daily as needed for moderate pain. 60 tablet 0  . pravastatin (PRAVACHOL) 80 MG tablet Take 1 tablet (80 mg total) by mouth at bedtime. 30 tablet 0  . thiamine 100 MG tablet Take 1 tablet (100 mg total) by mouth daily. (Patient not taking: Reported on 03/03/2019) 30 tablet 0    Musculoskeletal: Strength & Muscle Tone: within normal limits Gait & Station: unable to assess Patient leans: N/A  Psychiatric Specialty Exam: Physical Exam  Nursing note and vitals reviewed. Constitutional: He is oriented to person, place, and time. He appears well-developed.  HENT:  Head: Normocephalic.  Respiratory: Effort normal and breath sounds normal.  Musculoskeletal:        General: Normal range of motion.     Cervical back: Normal range of motion.  Neurological: He is alert and oriented to person, place, and time.  Skin: Skin is warm and dry.  Psychiatric: He has a normal mood and affect. His speech is normal and behavior is normal. Judgment and thought content normal. Cognition and memory are normal.    Review of Systems  Psychiatric/Behavioral: Negative.   All other systems  reviewed and are negative.   Blood pressure 134/90, pulse (!) 114, temperature 98.4 F (36.9 C), temperature source Oral, resp. rate 18, height 5\' 8"  (1.727 m), weight 86.2 kg, SpO2 95 %.Body mass index is 28.89 kg/m.  General Appearance: Casual  Eye Contact:  Good  Speech:  Clear and Coherent  Volume:  Normal  Mood:  Euthymic  Affect:  Appropriate and Congruent  Thought Process:  Coherent and Descriptions of Associations: Intact  Orientation:  Full (Time, Place, and Person)  Thought Content:  Logical  Suicidal Thoughts:  No  Homicidal Thoughts:  No  Memory:  NA  Judgement:  Good  Insight:  Good  Psychomotor Activity:  Normal  Concentration:  Concentration: Good  Recall:  Good  Fund of Knowledge:  Good  Language:  Good  Akathisia:  NA  Handed:  Right  AIMS (if indicated):     Assets:  Communication Skills Social Support  ADL's:  Intact  Cognition:  WNL  Sleep:        Treatment Plan Summary: dc in the am  Disposition: No evidence of imminent risk to self or others at present.   Patient does not meet criteria for psychiatric inpatient admission. Discussed crisis plan, support from social network, calling 911, coming to the Emergency Department, and calling Suicide Hotline.  Deloria Lair, NP 08/03/2019 9:21 PM

## 2019-08-04 NOTE — ED Provider Notes (Signed)
Patient is now sober, awake, alert. Ambulating w/o difficulty and tolerating PO. Does not appear to be actively withdrawing. Psych has rescinded IVC and pt is requesting discharge. Outpatient resources provided.   Duffy Bruce, MD 08/04/19 [phone]

## 2019-08-04 NOTE — ED Notes (Signed)
Pt. Requested and was given apple sauce and a cup of water.
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
## 2019-08-07 ENCOUNTER — Emergency Department
Admission: EM | Admit: 2019-08-07 | Discharge: 2019-08-08 | Disposition: A | Discharge status: Home / Self Care | Payer: No Typology Code available for payment source | Attending: Emergency Medicine | Admitting: Emergency Medicine

## 2019-08-07 ENCOUNTER — Other Ambulatory Visit: Payer: Self-pay

## 2019-08-07 DIAGNOSIS — Z79899 Other long term (current) drug therapy: Secondary | ICD-10-CM | POA: Insufficient documentation

## 2019-08-07 DIAGNOSIS — Y906 Blood alcohol level of 120-199 mg/100 ml: Secondary | ICD-10-CM | POA: Insufficient documentation

## 2019-08-07 DIAGNOSIS — R4182 Altered mental status, unspecified: Secondary | ICD-10-CM | POA: Diagnosis present

## 2019-08-07 DIAGNOSIS — I10 Essential (primary) hypertension: Secondary | ICD-10-CM | POA: Insufficient documentation

## 2019-08-07 DIAGNOSIS — F10920 Alcohol use, unspecified with intoxication, uncomplicated: Secondary | ICD-10-CM | POA: Diagnosis not present

## 2019-08-07 LAB — ETHANOL: Alcohol, Ethyl (B): 177 mg/dL — ABNORMAL HIGH (ref <10)

## 2019-08-07 MED ORDER — LORAZEPAM 2 MG/ML IJ SOLN: 2.0000 mg | Freq: Once | INTRAMUSCULAR | Status: AC

## 2019-08-07 MED ORDER — LORAZEPAM 2 MG/ML IJ SOLN
INTRAMUSCULAR | Status: AC
  Administered 2019-08-07: 2 mg via INTRAMUSCULAR
  Filled 2019-08-07: qty 1

## 2019-08-07 MED ORDER — HALOPERIDOL LACTATE 5 MG/ML IJ SOLN
INTRAMUSCULAR | Status: AC
  Administered 2019-08-07: 5 mg via INTRAMUSCULAR
  Filled 2019-08-07: qty 1

## 2019-08-07 MED ORDER — DIPHENHYDRAMINE HCL 50 MG/ML IJ SOLN: 50.0000 mg | Freq: Once | INTRAMUSCULAR | Status: AC

## 2019-08-07 MED ORDER — HALOPERIDOL LACTATE 5 MG/ML IJ SOLN: 5.0000 mg | Freq: Once | INTRAMUSCULAR | Status: AC

## 2019-08-07 MED ORDER — DIPHENHYDRAMINE HCL 50 MG/ML IJ SOLN
INTRAMUSCULAR | Status: AC
  Administered 2019-08-07: 50 mg via INTRAVENOUS
  Filled 2019-08-07: qty 1
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
## 2019-08-07 NOTE — ED Provider Notes (Addendum)
[facility] Emergency Department Provider Note  ____________________________________________   I have reviewed the triage vital signs and the nursing notes.   HISTORY  Chief Complaint Mental Health Problem and Alcohol Intoxication   History limited by: Intoxication   HPI Omar Davis is a 69 y.o. male who presents to the emergency department today brought in by EMS under IVC because of concerns for altered mental status and self-harm.  The patient appears in his normal intoxicated state.  He is yelling as he typically does.  He will not give any history.  Records reviewed. Per medical record review patient has a history of PTSD frequent ER visits for alcohol abuse.   Past Medical History:  Diagnosis Date  . Chronic pain   . Depressed   . Hypertension   . PTSD (post-traumatic stress disorder)   . TBI (traumatic brain injury) [facility])     Patient Active Problem List   Diagnosis Date Noted  . Iron deficiency anemia 04/08/2019  . Alcohol intoxication with delirium ([place])   . Sepsis ([place]) 02/08/2019  . Essential hypertension 02/05/2019  . Alcohol-induced mood disorder ([place]) 01/24/2019  . Alcohol dependence with intoxication, uncomplicated ([place]) [id number]  . Major depressive disorder, recurrent severe without psychotic features ([place]) 08/24/2018  . Suicidal ideation 09/25/2015  . Alcohol abuse 09/09/2015  . PTSD (post-traumatic stress disorder) 09/09/2015  . Agitation 09/09/2015  . Depressed   . Chronic pain     Past Surgical History:  Procedure Laterality Date  . BACK SURGERY    . HERNIA REPAIR    . KNEE SURGERY    . KNEE SURGERY    . prostectomy N/A     Prior to Admission medications   Medication Sig Start Date End Date Taking? Authorizing Provider  amlodipine-atorvastatin (CADUET) 10-10 MG tablet Take 1 tablet by mouth daily.    [provider]  divalproex (DEPAKOTE ER) 500 MG 24 hr tablet Take 2 tablets (1,000 mg total) by  mouth 2 (two) times daily. Patient not taking: Reported on 04/05/2019 02/07/19   Clapacs, Madie Reno, MD  DULoxetine (CYMBALTA) 30 MG capsule Take 1 capsule (30 mg total) by mouth daily. 02/07/19   Clapacs, Madie Reno, MD  lisinopril (ZESTRIL) 10 MG tablet Take 1 tablet (10 mg total) by mouth daily. Patient not taking: Reported on 04/05/2019 02/07/19   Clapacs, Madie Reno, MD  naproxen (NAPROSYN) 500 MG tablet Take 1 tablet (500 mg total) by mouth 2 (two) times daily as needed for moderate pain. 02/07/19   Clapacs, Madie Reno, MD  pravastatin (PRAVACHOL) 80 MG tablet Take 1 tablet (80 mg total) by mouth at bedtime. 02/07/19   Clapacs, Madie Reno, MD  thiamine 100 MG tablet Take 1 tablet (100 mg total) by mouth daily. Patient not taking: Reported on 03/03/2019 02/07/19   Clapacs, Madie Reno, MD    Allergies Codeine, Gabapentin, Lisinopril, and Penicillins  No family history on file.  Social History Social History   Tobacco Use  . Smoking status: Never Smoker  . Smokeless tobacco: Never Used  Substance Use Topics  . Alcohol use: Yes  . Drug use: Not Currently    Review of Systems Unable to obtain given intoxication ____________________________________________   PHYSICAL EXAM:  VITAL SIGNS: ED Triage Vitals [08/07/19 2000]  Enc Vitals Group     BP      Pulse      Resp      Temp      Temp src  SpO2      Weight      Height      Head Circumference      Peak Flow      Pain Score 0     Pain Loc      Pain Edu?      Excl. in [place]?      Constitutional: Appears intoxicated. Yelling.  Eyes: Conjunctivae are normal.  ENT      Head: Normocephalic and atraumatic.      Nose: No congestion/rhinnorhea.      Mouth/Throat: Mucous membranes are moist.      Neck: No stridor. Cardiovascular: Normal rate, regular rhythm.   Respiratory: Normal respiratory effort without tachypnea nor retractions.  Gastrointestinal: Soft and non tender. No rebound. No guarding.  Genitourinary: Deferred Musculoskeletal:  Normal range of motion in all extremities. No lower extremity edema. Neurologic:  Appears intoxicated. Moving all extremities.  Skin:  Superficial lacerations to bilateral wrists.  Psychiatric: Yelling. Upset.  ____________________________________________    LABS (pertinent positives/negatives)  Ethanol 177  ____________________________________________   EKG  None  ____________________________________________    RADIOLOGY  None  ____________________________________________   PROCEDURES  Procedures  ____________________________________________   INITIAL IMPRESSION / ASSESSMENT AND PLAN / ED COURSE  Pertinent labs & imaging results that were available during my care of the patient were reviewed by me and considered in my medical decision making (see chart for details).   Patient presented to the emergency department in his normal state of alcohol intoxication and agitation.  He was given medication to help calm him down. He did have superficial lacerations to bilateral wrists, none of which require advanced closure.  The patient has been placed in psychiatric observation due to the need to provide a safe environment for the patient while obtaining psychiatric consultation and evaluation, as well as ongoing medical and medication management to treat the patient's condition.  The patient has been placed under full IVC at this time.   ____________________________________________   FINAL CLINICAL IMPRESSION(S) / ED DIAGNOSES  Final diagnoses:  Alcoholic intoxication without complication ([place])     Note: This dictation was prepared with Dragon dictation. Any transcriptional errors that result from this process are unintentional     Nance Pear, MD 08/07/19 [id number]    Nance Pear, MD 08/07/19 [phone]

## 2019-08-08 NOTE — ED Notes (Signed)
Pt refused earlier offers of food and drink and pt refused lunch tray. Pt states he is ready to go home.

## 2019-08-08 NOTE — ED Notes (Signed)
Pt refused breakfast tray; given phone to make a call.
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
## 2019-08-08 NOTE — ED Notes (Signed)
Pt was given a warm blanket

## 2019-08-08 NOTE — ED Provider Notes (Signed)
-----------------------------------------   7:18 AM on 08/08/2019 -----------------------------------------   Blood pressure (!) 158/99, pulse 96, temperature 98.8 F (37.1 C), temperature source Oral, resp. rate 20, height 5\' 7"  (1.702 m), weight 81.6 kg, SpO2 97 %.  The patient is calm and cooperative at this time.  There have been no acute events since the last update.  Awaiting disposition plan from Behavioral Medicine and/or Social Work team(s).   Paulette Blanch, MD 08/08/19 [phone]

## 2019-08-12 ENCOUNTER — Emergency Department
Admission: EM | Admit: 2019-08-12 | Discharge: 2019-08-13 | Disposition: A | Discharge status: Home / Self Care | Payer: No Typology Code available for payment source | Attending: Emergency Medicine | Admitting: Emergency Medicine

## 2019-08-12 ENCOUNTER — Emergency Department: Payer: No Typology Code available for payment source

## 2019-08-12 DIAGNOSIS — F10929 Alcohol use, unspecified with intoxication, unspecified: Secondary | ICD-10-CM | POA: Diagnosis present

## 2019-08-12 DIAGNOSIS — R451 Restlessness and agitation: Secondary | ICD-10-CM | POA: Diagnosis present

## 2019-08-12 DIAGNOSIS — F32A Depression, unspecified: Secondary | ICD-10-CM | POA: Diagnosis present

## 2019-08-12 DIAGNOSIS — G8929 Other chronic pain: Secondary | ICD-10-CM | POA: Diagnosis present

## 2019-08-12 DIAGNOSIS — Z79899 Other long term (current) drug therapy: Secondary | ICD-10-CM | POA: Insufficient documentation

## 2019-08-12 DIAGNOSIS — F1094 Alcohol use, unspecified with alcohol-induced mood disorder: Secondary | ICD-10-CM | POA: Diagnosis present

## 2019-08-12 DIAGNOSIS — F332 Major depressive disorder, recurrent severe without psychotic features: Secondary | ICD-10-CM | POA: Diagnosis present

## 2019-08-12 DIAGNOSIS — F10920 Alcohol use, unspecified with intoxication, uncomplicated: Secondary | ICD-10-CM

## 2019-08-12 DIAGNOSIS — D509 Iron deficiency anemia, unspecified: Secondary | ICD-10-CM | POA: Diagnosis present

## 2019-08-12 DIAGNOSIS — F1020 Alcohol dependence, uncomplicated: Secondary | ICD-10-CM | POA: Diagnosis present

## 2019-08-12 DIAGNOSIS — F4310 Post-traumatic stress disorder, unspecified: Secondary | ICD-10-CM | POA: Diagnosis present

## 2019-08-12 DIAGNOSIS — F10120 Alcohol abuse with intoxication, uncomplicated: Secondary | ICD-10-CM | POA: Insufficient documentation

## 2019-08-12 DIAGNOSIS — F329 Major depressive disorder, single episode, unspecified: Secondary | ICD-10-CM | POA: Diagnosis not present

## 2019-08-12 DIAGNOSIS — A419 Sepsis, unspecified organism: Secondary | ICD-10-CM | POA: Diagnosis present

## 2019-08-12 DIAGNOSIS — F10220 Alcohol dependence with intoxication, uncomplicated: Secondary | ICD-10-CM | POA: Diagnosis present

## 2019-08-12 DIAGNOSIS — I10 Essential (primary) hypertension: Secondary | ICD-10-CM | POA: Diagnosis not present

## 2019-08-12 DIAGNOSIS — F1010 Alcohol abuse, uncomplicated: Secondary | ICD-10-CM | POA: Diagnosis present

## 2019-08-12 DIAGNOSIS — F10921 Alcohol use, unspecified with intoxication delirium: Secondary | ICD-10-CM | POA: Diagnosis present

## 2019-08-12 DIAGNOSIS — R45851 Suicidal ideations: Secondary | ICD-10-CM

## 2019-08-12 LAB — CBC WITH DIFFERENTIAL/PLATELET
Abs Immature Granulocytes: 0.08 10*3/uL — ABNORMAL HIGH (ref 0.00–0.07)
Basophils Absolute: 0.1 10*3/uL (ref 0.0–0.1)
Basophils Relative: 1 %
Eosinophils Absolute: 0.3 10*3/uL (ref 0.0–0.5)
Eosinophils Relative: 3 %
HCT: 48.9 % (ref 39.0–52.0)
Hemoglobin: 16 g/dL (ref 13.0–17.0)
Immature Granulocytes: 1 %
Lymphocytes Relative: 16 %
Lymphs Abs: 1.5 10*3/uL (ref 0.7–4.0)
MCH: 26.6 pg (ref 26.0–34.0)
MCHC: 32.7 g/dL (ref 30.0–36.0)
MCV: 81.4 fL (ref 80.0–100.0)
Monocytes Absolute: 0.6 10*3/uL (ref 0.1–1.0)
Monocytes Relative: 7 %
Neutro Abs: 6.8 10*3/uL (ref 1.7–7.7)
Neutrophils Relative %: 72 %
Platelets: 229 10*3/uL (ref 150–400)
RBC: 6.01 MIL/uL — ABNORMAL HIGH (ref 4.22–5.81)
RDW: 16.9 % — ABNORMAL HIGH (ref 11.5–15.5)
WBC: 9.4 10*3/uL (ref 4.0–10.5)
nRBC: 0 % (ref 0.0–0.2)

## 2019-08-12 LAB — BASIC METABOLIC PANEL
Anion gap: 13 (ref 5–15)
BUN: 27 mg/dL — ABNORMAL HIGH (ref 8–23)
CO2: 21 mmol/L — ABNORMAL LOW (ref 22–32)
Calcium: 9 mg/dL (ref 8.9–10.3)
Chloride: 109 mmol/L (ref 98–111)
Creatinine, Ser: 1.06 mg/dL (ref 0.61–1.24)
GFR calc Af Amer: >60 mL/min (ref >60)
GFR calc non Af Amer: >60 mL/min (ref >60)
Glucose, Bld: 96 mg/dL (ref 70–99)
Potassium: 4.2 mmol/L (ref 3.5–5.1)
Sodium: 143 mmol/L (ref 135–145)

## 2019-08-12 LAB — ETHANOL: Alcohol, Ethyl (B): 160 mg/dL — ABNORMAL HIGH (ref <10)

## 2019-08-12 MED ORDER — DROPERIDOL 2.5 MG/ML IJ SOLN: 5.0000 mg | Freq: Once | INTRAMUSCULAR | Status: DC

## 2019-08-12 MED ORDER — LORAZEPAM 2 MG/ML IJ SOLN
2.0000 mg | Freq: Once | INTRAMUSCULAR | Status: AC
  Administered 2019-08-12: 2 mg via INTRAMUSCULAR
  Filled 2019-08-12: qty 1

## 2019-08-12 MED ORDER — DROPERIDOL 2.5 MG/ML IJ SOLN
5.0000 mg | Freq: Once | INTRAMUSCULAR | Status: AC
  Administered 2019-08-12: 5 mg via INTRAMUSCULAR

## 2019-08-12 MED ORDER — DIPHENHYDRAMINE HCL 50 MG/ML IJ SOLN
25.0000 mg | Freq: Once | INTRAMUSCULAR | Status: AC
  Administered 2019-08-12: 25 mg via INTRAMUSCULAR
  Filled 2019-08-12: qty 1

## 2019-08-12 NOTE — ED Provider Notes (Signed)
The patient has been placed in psychiatric observation due to the need to provide a safe environment for the patient while obtaining psychiatric consultation and evaluation, as well as ongoing medical and medication management to treat the patient's condition.  The patient has been placed under full IVC at this time.    Lilia Pro., MD 08/12/19 [phone]

## 2019-08-12 NOTE — ED Notes (Signed)
Patient returned from xray.
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
## 2019-08-12 NOTE — ED Notes (Signed)
Patient given benadryl, and ativan due to demanding behavior to leave. Patient appeared to still be intoxicated, and kept yelling "I cant breathe motherfuckers" Patient attempted to walk out of quad area and when asked where he was going "he turned around and said he was walking to the bathroom" patient went to the bathroom in the quad and came out yelling. Nurse and officer Isley  explained to the patient that he was getting some medication to help him to calm down and that will relax him. Patient agreed to take the medicine. Patient continues to not be dressed out at this time, due to his aggressive behavior. Patient escorted to room 23 by officer Lia Hopping.

## 2019-08-12 NOTE — ED Notes (Signed)
Patient went to xray 

## 2019-08-12 NOTE — ED Notes (Signed)
Pt asleep, meal tray placed on sink in rm.

## 2019-08-12 NOTE — ED Triage Notes (Signed)
Patient arrived with [facility] with [employer] escort. Patient screaming, "why do women hate me" Patient appears to be intoxicated,.

## 2019-08-12 NOTE — ED Notes (Signed)
Patient is unable to participate with the assessor at this time.

## 2019-08-12 NOTE — ED Provider Notes (Signed)
[facility] Emergency Department Provider Note   ____________________________________________   First MD Initiated Contact with Patient 08/12/19 1435     (approximate)  I have reviewed the triage vital signs and the nursing notes.   HISTORY  Chief Complaint Alcohol Problem    HPI Omar Davis is a 69 y.o. male with past medical history of PTSD and alcohol abuse who presents to the ED for alcohol intoxication.  History is limited due to patient's intoxication and he is frequently yelling out, difficult to redirect.  He occasionally yells out "why do women hate me" and "I just want to die".  He does admit to significant alcohol consumption today, reports about a pint of liquor.  He has a regular history of alcohol abuse and is well-known to the ED for similar presentations.  He additionally complains of some difficulty breathing and chest pain, but on further questioning states this has been going on "for a while" and he is mostly having difficulty breathing through his nose, which he feels is congested.  He denies any fevers or cough recently.        Past Medical History:  Diagnosis Date  . Chronic pain   . Depressed   . Hypertension   . PTSD (post-traumatic stress disorder)   . TBI (traumatic brain injury) [facility])     Patient Active Problem List   Diagnosis Date Noted  . Iron deficiency anemia 04/08/2019  . Alcohol intoxication with delirium ([place])   . Sepsis ([place]) 02/08/2019  . Essential hypertension 02/05/2019  . Alcohol-induced mood disorder ([place]) 01/24/2019  . Alcohol dependence with intoxication, uncomplicated ([place]) [id number]  . 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 Suicidal ideation 09/25/2015  . Alcohol abuse 09/09/2015  . PTSD (post-traumatic stress disorder) 09/09/2015  . Agitation 09/09/2015  . Depressed   . Chronic pain     Past Surgical History:  Procedure Laterality Date   . BACK SURGERY    . HERNIA REPAIR    . KNEE SURGERY    . KNEE SURGERY    . prostectomy N/A     Prior to Admission medications   Medication Sig Start Date End Date Taking? Authorizing Provider  amLODipine (NORVASC) 10 MG tablet Take 10 mg by mouth daily.    [provider]  atorvastatin (LIPITOR) 80 MG tablet Take 40 mg by mouth at bedtime.    [provider]  cyclobenzaprine (FLEXERIL) 10 MG tablet Take 10 mg by mouth 3 (three) times daily as needed for muscle spasms.    [provider]  divalproex (DEPAKOTE ER) 500 MG 24 hr tablet Take 2 tablets (1,000 mg total) by mouth 2 (two) times daily. Patient not taking: Reported on 04/05/2019 02/07/19   Clapacs, Madie Reno, MD  DULoxetine (CYMBALTA) 30 MG capsule Take 1 capsule (30 mg total) by mouth daily. Patient taking differently: Take 90 mg by mouth daily.  02/07/19   Clapacs, Madie Reno, MD  hydrOXYzine (ATARAX/VISTARIL) 25 MG tablet Take 25-50 mg by mouth at bedtime.    [provider]  lisinopril (ZESTRIL) 10 MG tablet Take 1 tablet (10 mg total) by mouth daily. Patient not taking: Reported on 04/05/2019 02/07/19   Clapacs, Madie Reno, MD  naproxen (NAPROSYN) 500 MG tablet Take 1 tablet (500 mg total) by mouth 2 (two) times daily as needed for moderate pain. 02/07/19   Clapacs, Madie Reno, MD  pantoprazole (PROTONIX) 40 MG tablet Take 40 mg by mouth 2 (  two) times daily.    [provider]  pravastatin (PRAVACHOL) 80 MG tablet Take 1 tablet (80 mg total) by mouth at bedtime. Patient not taking: Reported on 08/08/2019 02/07/19   Clapacs, Madie Reno, MD  thiamine 100 MG tablet Take 1 tablet (100 mg total) by mouth daily. Patient not taking: Reported on 03/03/2019 02/07/19   Clapacs, Madie Reno, MD  traZODone (DESYREL) 100 MG tablet Take 100-200 mg by mouth at bedtime as needed for sleep.    [provider]  triamcinolone cream (KENALOG) 0.1 % Apply 1 application topically 2 (two) times daily. 07/23/19   [provider]    Allergies Codeine, Gabapentin, Lisinopril, and Penicillins  No family history on file.  Social History Social History   Tobacco Use  . Smoking status: Never Smoker  . Smokeless tobacco: Never Used  Substance Use Topics  . Alcohol use: Yes  . Drug use: Not Currently    Review of Systems  Constitutional: No fever/chills Eyes: No visual changes. ENT: No sore throat.  Positive for congestion. Cardiovascular: Positive for chest pain. Respiratory: Positive for shortness of breath. Gastrointestinal: No abdominal pain.  No nausea, no vomiting.  No diarrhea.  No constipation. Genitourinary: Negative for dysuria. Musculoskeletal: Negative for back pain. Skin: Negative for rash. Neurological: Negative for headaches, focal weakness or numbness.  ____________________________________________   PHYSICAL EXAM:  VITAL SIGNS: ED Triage Vitals [08/12/19 1445]  Enc Vitals Group     BP      Pulse      Resp      Temp      Temp src      SpO2 97 %     Weight      Height      Head Circumference      Peak Flow      Pain Score      Pain Loc      Pain Edu?      Excl. in [place]?     Constitutional: Alert and oriented, intoxicated appearing. Eyes: Conjunctivae are normal. Head: Atraumatic. Nose: No congestion/rhinnorhea. Mouth/Throat: Mucous membranes are moist. Neck: Normal ROM Cardiovascular: Normal rate, regular rhythm. Grossly normal heart sounds. Respiratory: Normal respiratory effort.  No retractions. Lungs CTAB. Gastrointestinal: Soft and nontender. No distention. Genitourinary: deferred Musculoskeletal: No lower extremity tenderness nor edema. Neurologic:  Normal speech and language. No gross focal neurologic deficits are appreciated. Skin:  Skin is warm, dry and intact. No rash noted. Psychiatric: Mood and affect are normal. Speech and behavior are normal.  ____________________________________________   LABS (all labs ordered are listed, but only  abnormal results are displayed)  Labs Reviewed  BASIC METABOLIC PANEL  CBC WITH DIFFERENTIAL/PLATELET  ETHANOL   ____________________________________________  EKG  ED ECG REPORT I, Blake Divine, the attending physician, personally viewed and interpreted this ECG.   Date: 08/12/2019  EKG Time: 14:52  Rate: 102  Rhythm: sinus tachycardia  Axis: LAD  Intervals:none  ST&T Change: None   PROCEDURES  Procedure(s) performed (including Critical Care):  Procedures   ____________________________________________   INITIAL IMPRESSION / ASSESSMENT AND PLAN / ED COURSE       69 year old male with history of PTSD and alcohol abuse presents to the ED intoxicated and agitated, yelling that he wants his life to end.  He has a history of similar presentations to the ED, after which he recants any thoughts of harming himself once sober.  Due to his agitation, he had to be chemically sedated with droperidol, is  now more calm and cooperative.  He did complain of chest pain at 1 point, however this appears to be a chronic complaint for him and EKG shows no acute ischemic changes.  He complained of some shortness of breath as well, however this primarily seems to be related to some nasal congestion.  Chest x-ray is negative for acute process.  Patient turned over to oncoming provider pending additional lab results, if these are unremarkable he may be medically cleared for psychiatric evaluation.  The patient has been placed in psychiatric observation due to the need to provide a safe environment for the patient while obtaining psychiatric consultation and evaluation, as well as ongoing medical and medication management to treat the patient's condition.  The patient has been placed under full IVC at this time.       ____________________________________________   FINAL CLINICAL IMPRESSION(S) / ED DIAGNOSES  Final diagnoses:  Alcoholic intoxication without complication [facility])     ED  Discharge Orders    None       Note:  This document was prepared using Dragon voice recognition software and may include unintentional dictation errors.   Blake Divine, MD 08/12/19 [phone]

## 2019-08-13 NOTE — Consult Note (Signed)
[place] [facility] Psychiatry Consult   Reason for Consult: Alcohol Problems  Referring Physician:  Dr. Charna Archer Patient Identification: Omar Davis MRN:  [id number] Principal Diagnosis: <principal problem not specified> Diagnosis:  Active Problems:   Depressed   Chronic pain   Alcohol abuse   PTSD (post-traumatic stress disorder)   Agitation   Suicidal ideation   Alcohol dependence with intoxication, uncomplicated ([place])   Major depressive disorder, recurrent severe without psychotic features ([place])   Alcohol-induced mood disorder ([place])   Essential hypertension   Sepsis ([place])   Alcohol intoxication with delirium ([place])   Iron deficiency anemia   Total Time spent with patient: 30 minutes  Subjective:  " I am okay honey. I was drinking." Omar Davis is a 69 y.o. male presented to [facility] ED via EMS/PD voluntarily but was placed under involuntary commitment status (IVC) by the EDP due to the patient being agitated and combative.  Per the ED triage nursing note, the patient was yelling at EMS/PD, stating, "Which one of you motherfuckers done this to me! Nobody cares about me. You brought me here to kill me, fuck you!"  He had to be medicated and was given 5mg  Droperidol IM.  The patient alcohol level 160 mg/dl.   The patient was seen face-to-face by this provider; chart reviewed and consulted with Dr. Joan Mayans on 08/12/2019 due to the patient's care. It was discussed with the EDP that the patient does not meet the criteria to be admitted to the psychiatric inpatient unit.   The patient is intoxicated and will remain on the observation overnight and discharge in the a.m. once he is no longer inebriated. On evaluation, the patient is alert and oriented x 4, calm, cooperative, able to participate in the assessment process, and mood-congruent with affect.   The patient does admit to having had too many drinks today.  He does voice, "I want to get help, but it is hard to do." The patient does  not appear to be responding to internal or external stimuli. Neither is the patient presenting with any delusional thinking. The patient denies auditory or visual hallucinations. The patient denies suicidal, homicidal, or self-harm ideations. The patient is not presenting with any psychotic or paranoid behaviors. During an encounter with the patient, he was able to answer questions appropriately.  The patient voiced, "do you know what time it is?"  " When do you think I would be able to leave here?"  Plan: The patient is not a safety risk to himself or others once he sobers up. He does not meet the criteria for psychiatric inpatient admission.  The patient can be discharged in the a.m. HPI: Per Dr. Charna Archer: Omar Davis is a 69 y.o. male with past medical history of PTSD and alcohol abuse who presents to the ED for alcohol intoxication.  History is limited due to patient's intoxication and he is frequently yelling out, difficult to redirect.  He occasionally yells out "why do women hate me" and "I just want to die".  He does admit to significant alcohol consumption today, reports about a pint of liquor.  He has a regular history of alcohol abuse and is well-known to the ED for similar presentations.  He additionally complains of some difficulty breathing and chest pain, but on further questioning states this has been going on "for a while" and he is mostly having difficulty breathing through his nose, which he feels is congested.  He denies any fevers or cough recently.  Past Psychiatric History: Depression and ETOH abuse  Risk to Self:   No Risk to Others:  No Prior Inpatient Therapy:   Yes Prior Outpatient Therapy:   Yes  Past Medical History:  Past Medical History:  Diagnosis Date  . Chronic pain   . Depressed   . Hypertension   . PTSD (post-traumatic stress disorder)   . TBI (traumatic brain injury) [facility])     Past Surgical History:  Procedure Laterality Date  . BACK SURGERY    .  HERNIA REPAIR    . KNEE SURGERY    . KNEE SURGERY    . prostectomy N/A    Family History: No family history on file. Family Psychiatric  History: unknown Social History:  Social History   Substance and Sexual Activity  Alcohol Use Yes     Social History   Substance and Sexual Activity  Drug Use Not Currently    Social History   Socioeconomic History  . Marital status: Legally Separated    Spouse name: Not on file  . Number of children: Not on file  . Years of education: Not on file  . Highest education level: Not on file  Occupational History  . Not on file  Tobacco Use  . Smoking status: Never Smoker  . Smokeless tobacco: Never Used  Substance and Sexual Activity  . Alcohol use: Yes  . Drug use: Not Currently  . Sexual activity: Not Currently  Other Topics Concern  . Not on file  Social History Narrative  . Not on file   Social Determinants of Health   Financial Resource Strain:   . Difficulty of Paying Living Expenses:   Food Insecurity:   . Worried About Charity fundraiser in the Last Year:   . Arboriculturist in the Last Year:   Transportation Needs:   . Film/video editor (Medical):   Marland Kitchen Lack of Transportation (Non-Medical):   Physical Activity:   . Days of Exercise per Week:   . Minutes of Exercise per Session:   Stress:   . Feeling of Stress :   Social Connections:   . Frequency of Communication with Friends and Family:   . Frequency of Social Gatherings with Friends and Family:   . Attends Religious Services:   . Active Member of Clubs or Organizations:   . Attends Archivist Meetings:   Marland Kitchen Marital Status:    Additional Social History:    Allergies:   Allergies  Allergen Reactions  . Codeine Hives and Rash  . Gabapentin Other (See Comments) and Anaphylaxis    seizures Other reaction(s): Other (See Comments) seizures Anaphylactic Shock   . Lisinopril Swelling    Lip Swelling   . Penicillins Rash    Did it involve  swelling of the face/tongue/throat, SOB, or low BP? No Did it involve sudden or severe rash/hives, skin peeling, or any reaction on the inside of your mouth or nose? Yes Did you need to seek medical attention at a hospital or doctor's office? No When did it last happen? If all above answers are "NO", may proceed with cephalosporin use.     Labs:  Results for orders placed or performed during the hospital encounter of 08/12/19 (from the past 48 hour(s))  Basic metabolic panel     Status: Abnormal   Collection Time: 08/12/19  4:26 PM  Result Value Ref Range   Sodium 143 135 - 145 mmol/L   Potassium 4.2 3.5 - 5.1 mmol/L  Chloride 109 98 - 111 mmol/L   CO2 21 (L) 22 - 32 mmol/L   Glucose, Bld 96 70 - 99 mg/dL    Comment: Glucose reference range applies only to samples taken after fasting for at least 8 hours.   BUN 27 (H) 8 - 23 mg/dL   Creatinine, Ser 1.06 0.61 - 1.24 mg/dL   Calcium 9.0 8.9 - 10.3 mg/dL   GFR calc non Af Amer >60 >60 mL/min   GFR calc Af Amer >60 >60 mL/min   Anion gap 13 5 - 15    Comment: Performed at [facility], [address] [place], [place] [address]  CBC with Differential     Status: Abnormal   Collection Time: 08/12/19  4:26 PM  Result Value Ref Range   WBC 9.4 4.0 - 10.5 K/uL   RBC 6.01 (H) 4.22 - 5.81 MIL/uL   Hemoglobin 16.0 13.0 - 17.0 g/dL   HCT 48.9 39.0 - 52.0 %   MCV 81.4 80.0 - 100.0 fL   MCH 26.6 26.0 - 34.0 pg   MCHC 32.7 30.0 - 36.0 g/dL   RDW 16.9 (H) 11.5 - 15.5 %   Platelets 229 150 - 400 K/uL   nRBC 0.0 0.0 - 0.2 %   Neutrophils Relative % 72 %   Neutro Abs 6.8 1.7 - 7.7 K/uL   Lymphocytes Relative 16 %   Lymphs Abs 1.5 0.7 - 4.0 K/uL   Monocytes Relative 7 %   Monocytes Absolute 0.6 0.1 - 1.0 K/uL   Eosinophils Relative 3 %   Eosinophils Absolute 0.3 0.0 - 0.5 K/uL   Basophils Relative 1 %   Basophils Absolute 0.1 0.0 - 0.1 K/uL   Immature Granulocytes 1 %   Abs Immature Granulocytes 0.08 (H) 0.00 - 0.07  K/uL    Comment: Performed at [facility], [address] [place], [place] [address]  Ethanol     Status: Abnormal   Collection Time: 08/12/19  4:26 PM  Result Value Ref Range   Alcohol, Ethyl (B) 160 (H) <10 mg/dL    Comment: (NOTE) Lowest detectable limit for serum alcohol is 10 mg/dL. For medical purposes only. Performed at [facility], [address] [place], [place] [address]     No current facility-administered medications for this encounter.   Current Outpatient Medications  Medication Sig Dispense Refill  . amLODipine (NORVASC) 10 MG tablet Take 10 mg by mouth daily.    Marland Kitchen atorvastatin (LIPITOR) 80 MG tablet Take 40 mg by mouth at bedtime.    . cyclobenzaprine (FLEXERIL) 10 MG tablet Take 10 mg by mouth 3 (three) times daily as needed for muscle spasms.    . divalproex (DEPAKOTE ER) 500 MG 24 hr tablet Take 2 tablets (1,000 mg total) by mouth 2 (two) times daily. (Patient not taking: Reported on 04/05/2019) 120 tablet 0  . DULoxetine (CYMBALTA) 30 MG capsule Take 1 capsule (30 mg total) by mouth daily. (Patient taking differently: Take 90 mg by mouth daily. ) 30 capsule 0  . hydrOXYzine (ATARAX/VISTARIL) 25 MG tablet Take 25-50 mg by mouth at bedtime.    Marland Kitchen lisinopril (ZESTRIL) 10 MG tablet Take 1 tablet (10 mg total) by mouth daily. (Patient not taking: Reported on 04/05/2019) 30 tablet 0  . naproxen (NAPROSYN) 500 MG tablet Take 1 tablet (500 mg total) by mouth 2 (two) times daily as needed for moderate pain. 60 tablet 0  . pantoprazole (PROTONIX) 40 MG tablet Take 40 mg  by mouth 2 (two) times daily.    . pravastatin (PRAVACHOL) 80 MG tablet Take 1 tablet (80 mg total) by mouth at bedtime. (Patient not taking: Reported on 08/08/2019) 30 tablet 0  . thiamine 100 MG tablet Take 1 tablet (100 mg total) by mouth daily. (Patient not taking: Reported on 03/03/2019) 30 tablet 0  . traZODone (DESYREL) 100 MG tablet Take 100-200 mg by mouth at bedtime as needed for  sleep.    Marland Kitchen triamcinolone cream (KENALOG) 0.1 % Apply 1 application topically 2 (two) times daily.      Musculoskeletal: Strength & Muscle Tone: within normal limits Gait & Station: unable to assess Patient leans: N/A  Psychiatric Specialty Exam: Physical Exam  Nursing note and vitals reviewed. Constitutional: He is oriented to person, place, and time. He appears well-developed.  HENT:  Head: Normocephalic.  Respiratory: Effort normal and breath sounds normal.  Musculoskeletal:        General: Normal range of motion.     Cervical back: Normal range of motion.  Neurological: He is alert and oriented to person, place, and time.  Skin: Skin is warm and dry.  Psychiatric: He has a normal mood and affect. His speech is normal and behavior is normal. Judgment and thought content normal. Cognition and memory are normal.    Review of Systems  Psychiatric/Behavioral: Negative.   All other systems reviewed and are negative.   Blood pressure (!) 153/90, pulse 80, temperature 98.2 F (36.8 C), temperature source Oral, resp. rate 20, SpO2 97 %.There is no height or weight on file to calculate BMI.  General Appearance: Casual  Eye Contact:  Good  Speech:  Clear and Coherent  Volume:  Normal  Mood:  Euthymic  Affect:  Appropriate and Congruent  Thought Process:  Coherent and Descriptions of Associations: Intact  Orientation:  Full (Time, Place, and Person)  Thought Content:  Logical  Suicidal Thoughts:  No  Homicidal Thoughts:  No  Memory:  NA  Judgement:  Good  Insight:  Good  Psychomotor Activity:  Normal  Concentration:  Concentration: Good  Recall:  Good  Fund of Knowledge:  Good  Language:  Good  Akathisia:  NA  Handed:  Right  AIMS (if indicated):     Assets:  Communication Skills Social Support  ADL's:  Intact  Cognition:  WNL  Sleep:    Okay     Treatment Plan Summary: The patient is psychiatrically cleared and can be discharged in the a.m. when he is no longer  under the influence of alcohol.  Disposition: No evidence of imminent risk to self or others at present.   Patient does not meet criteria for psychiatric inpatient admission. Discussed crisis plan, support from social network, calling 911, coming to the Emergency Department, and calling Suicide Hotline.  Caroline Sauger, NP 08/13/2019 1:38 AM

## 2019-08-13 NOTE — ED Provider Notes (Signed)
Patient evaluated by the psychiatry staff with recommendation for discharge and the patient is no longer intoxicated.  Patient is now alert and oriented clinically sober.  Patient denies any suicidal ideation.  Stating "I was just drunk".   Gregor Hams, MD 08/13/19 [phone]

## 2019-08-13 NOTE — ED Notes (Signed)
Pt. Alert and oriented, warm and dry, in no distress. Pt. Denies SI, HI, and AVH. Pt states he is ready to leave that he has to get to motel and get his belongings. EDP made aware. Pt. Encouraged to let nursing staff know of any concerns or needs.

## 2019-08-14 ENCOUNTER — Emergency Department
Admission: EM | Admit: 2019-08-14 | Discharge: 2019-08-15 | Disposition: A | Discharge status: Home / Self Care | Payer: No Typology Code available for payment source | Attending: Emergency Medicine | Admitting: Emergency Medicine

## 2019-08-14 ENCOUNTER — Encounter: Payer: Self-pay | Admitting: Emergency Medicine

## 2019-08-14 ENCOUNTER — Emergency Department: Payer: No Typology Code available for payment source

## 2019-08-14 ENCOUNTER — Other Ambulatory Visit: Payer: Self-pay

## 2019-08-14 DIAGNOSIS — F1020 Alcohol dependence, uncomplicated: Secondary | ICD-10-CM | POA: Diagnosis present

## 2019-08-14 DIAGNOSIS — F332 Major depressive disorder, recurrent severe without psychotic features: Secondary | ICD-10-CM | POA: Diagnosis present

## 2019-08-14 DIAGNOSIS — R45851 Suicidal ideations: Secondary | ICD-10-CM

## 2019-08-14 DIAGNOSIS — A419 Sepsis, unspecified organism: Secondary | ICD-10-CM | POA: Diagnosis present

## 2019-08-14 DIAGNOSIS — F10921 Alcohol use, unspecified with intoxication delirium: Secondary | ICD-10-CM | POA: Diagnosis present

## 2019-08-14 DIAGNOSIS — F29 Unspecified psychosis not due to a substance or known physiological condition: Secondary | ICD-10-CM | POA: Insufficient documentation

## 2019-08-14 DIAGNOSIS — F10920 Alcohol use, unspecified with intoxication, uncomplicated: Secondary | ICD-10-CM | POA: Insufficient documentation

## 2019-08-14 DIAGNOSIS — F32A Depression, unspecified: Secondary | ICD-10-CM | POA: Diagnosis present

## 2019-08-14 DIAGNOSIS — F1094 Alcohol use, unspecified with alcohol-induced mood disorder: Secondary | ICD-10-CM | POA: Diagnosis present

## 2019-08-14 DIAGNOSIS — I10 Essential (primary) hypertension: Secondary | ICD-10-CM | POA: Diagnosis present

## 2019-08-14 DIAGNOSIS — F1010 Alcohol abuse, uncomplicated: Secondary | ICD-10-CM | POA: Diagnosis present

## 2019-08-14 DIAGNOSIS — D509 Iron deficiency anemia, unspecified: Secondary | ICD-10-CM | POA: Diagnosis present

## 2019-08-14 DIAGNOSIS — R451 Restlessness and agitation: Secondary | ICD-10-CM | POA: Diagnosis present

## 2019-08-14 DIAGNOSIS — F329 Major depressive disorder, single episode, unspecified: Secondary | ICD-10-CM | POA: Diagnosis present

## 2019-08-14 DIAGNOSIS — F4310 Post-traumatic stress disorder, unspecified: Secondary | ICD-10-CM | POA: Diagnosis present

## 2019-08-14 DIAGNOSIS — G8929 Other chronic pain: Secondary | ICD-10-CM | POA: Diagnosis present

## 2019-08-14 DIAGNOSIS — F10220 Alcohol dependence with intoxication, uncomplicated: Secondary | ICD-10-CM | POA: Diagnosis present

## 2019-08-14 LAB — CBC WITH DIFFERENTIAL/PLATELET
Abs Immature Granulocytes: 0.07 10*3/uL (ref 0.00–0.07)
Basophils Absolute: 0.1 10*3/uL (ref 0.0–0.1)
Basophils Relative: 1 %
Eosinophils Absolute: 0.3 10*3/uL (ref 0.0–0.5)
Eosinophils Relative: 4 %
HCT: 44.7 % (ref 39.0–52.0)
Hemoglobin: 15 g/dL (ref 13.0–17.0)
Immature Granulocytes: 1 %
Lymphocytes Relative: 19 %
Lymphs Abs: 1.4 10*3/uL (ref 0.7–4.0)
MCH: 27.2 pg (ref 26.0–34.0)
MCHC: 33.6 g/dL (ref 30.0–36.0)
MCV: 81 fL (ref 80.0–100.0)
Monocytes Absolute: 0.6 10*3/uL (ref 0.1–1.0)
Monocytes Relative: 8 %
Neutro Abs: 5.1 10*3/uL (ref 1.7–7.7)
Neutrophils Relative %: 67 %
Platelets: 210 10*3/uL (ref 150–400)
RBC: 5.52 MIL/uL (ref 4.22–5.81)
RDW: 16.8 % — ABNORMAL HIGH (ref 11.5–15.5)
WBC: 7.6 10*3/uL (ref 4.0–10.5)
nRBC: 0 % (ref 0.0–0.2)

## 2019-08-14 LAB — URINALYSIS, COMPLETE (UACMP) WITH MICROSCOPIC
Bacteria, UA: NONE SEEN
Bilirubin Urine: NEGATIVE
Glucose, UA: NEGATIVE mg/dL
Hgb urine dipstick: NEGATIVE
Ketones, ur: NEGATIVE mg/dL
Leukocytes,Ua: NEGATIVE
Nitrite: NEGATIVE
Protein, ur: NEGATIVE mg/dL
Specific Gravity, Urine: 1.01 (ref 1.005–1.030)
Squamous Epithelial / HPF: NONE SEEN (ref 0–5)
pH: 6 (ref 5.0–8.0)

## 2019-08-14 LAB — COMPREHENSIVE METABOLIC PANEL
ALT: 26 U/L (ref 0–44)
AST: 29 U/L (ref 15–41)
Albumin: 3.8 g/dL (ref 3.5–5.0)
Alkaline Phosphatase: 112 U/L (ref 38–126)
Anion gap: 12 (ref 5–15)
BUN: 23 mg/dL (ref 8–23)
CO2: 21 mmol/L — ABNORMAL LOW (ref 22–32)
Calcium: 8.7 mg/dL — ABNORMAL LOW (ref 8.9–10.3)
Chloride: 109 mmol/L (ref 98–111)
Creatinine, Ser: 1.11 mg/dL (ref 0.61–1.24)
GFR calc Af Amer: >60 mL/min (ref >60)
GFR calc non Af Amer: >60 mL/min (ref >60)
Glucose, Bld: 112 mg/dL — ABNORMAL HIGH (ref 70–99)
Potassium: 3.7 mmol/L (ref 3.5–5.1)
Sodium: 142 mmol/L (ref 135–145)
Total Bilirubin: 0.6 mg/dL (ref 0.3–1.2)
Total Protein: 6.6 g/dL (ref 6.5–8.1)

## 2019-08-14 LAB — ETHANOL: Alcohol, Ethyl (B): 246 mg/dL — ABNORMAL HIGH (ref <10)

## 2019-08-14 MED ORDER — LORAZEPAM 2 MG/ML IJ SOLN: 0.0000 mg | Freq: Two times a day (BID) | INTRAMUSCULAR | Status: DC

## 2019-08-14 MED ORDER — DULOXETINE HCL 60 MG PO CPEP
90.0000 mg | ORAL_CAPSULE | Freq: Every day | ORAL | Status: DC
  Filled 2019-08-14 (×2): qty 1

## 2019-08-14 MED ORDER — LORAZEPAM 2 MG/ML IJ SOLN
2.0000 mg | Freq: Once | INTRAMUSCULAR | Status: AC
  Administered 2019-08-14: 2 mg via INTRAMUSCULAR
  Filled 2019-08-14: qty 1

## 2019-08-14 MED ORDER — ATORVASTATIN CALCIUM 20 MG PO TABS
40.0000 mg | ORAL_TABLET | Freq: Every day | ORAL | Status: DC
  Administered 2019-08-14: 40 mg via ORAL
  Filled 2019-08-14: qty 2

## 2019-08-14 MED ORDER — LORAZEPAM 2 MG PO TABS: 0.0000 mg | ORAL_TABLET | Freq: Four times a day (QID) | ORAL | Status: DC

## 2019-08-14 MED ORDER — HALOPERIDOL LACTATE 5 MG/ML IJ SOLN
10.0000 mg | Freq: Once | INTRAMUSCULAR | Status: AC
  Administered 2019-08-14: 10 mg via INTRAMUSCULAR
  Filled 2019-08-14: qty 2

## 2019-08-14 MED ORDER — LORAZEPAM 2 MG PO TABS: 0.0000 mg | ORAL_TABLET | Freq: Two times a day (BID) | ORAL | Status: DC

## 2019-08-14 MED ORDER — LORAZEPAM 2 MG/ML IJ SOLN: 0.0000 mg | Freq: Four times a day (QID) | INTRAMUSCULAR | Status: DC

## 2019-08-14 MED ORDER — THIAMINE HCL 100 MG PO TABS
100.0000 mg | ORAL_TABLET | Freq: Every day | ORAL | Status: DC
  Administered 2019-08-14: 100 mg via ORAL
  Filled 2019-08-14: qty 1

## 2019-08-14 MED ORDER — AMLODIPINE BESYLATE 5 MG PO TABS: 10.0000 mg | ORAL_TABLET | Freq: Every day | ORAL | Status: DC

## 2019-08-14 MED ORDER — HYDROXYZINE HCL 25 MG PO TABS
25.0000 mg | ORAL_TABLET | Freq: Every day | ORAL | Status: DC
  Administered 2019-08-14: 25 mg via ORAL
  Filled 2019-08-14: qty 1

## 2019-08-14 MED ORDER — THIAMINE HCL 100 MG/ML IJ SOLN: 100.0000 mg | Freq: Every day | INTRAMUSCULAR | Status: DC

## 2019-08-14 MED ORDER — DIPHENHYDRAMINE HCL 50 MG/ML IJ SOLN
50.0000 mg | Freq: Once | INTRAMUSCULAR | Status: AC
  Administered 2019-08-14: 50 mg via INTRAMUSCULAR
  Filled 2019-08-14: qty 1

## 2019-08-14 NOTE — ED Notes (Signed)
Pt was "dressed out" after being placed in room 21. Items removed and bagged include one pair of socks, one pair of pants, one pair of shoes, one belt one shirt, one wallet, one cell phone. Pt also had a box of other belongings that was placed in separate bag.

## 2019-08-14 NOTE — ED Notes (Signed)
Pt. Currently sleeping in bed quad #21A.

## 2019-08-14 NOTE — BH Assessment (Signed)
[place] Assessment Progress Note    TTS will not be able to assess this patient until his BAL and Labs have returned.

## 2019-08-14 NOTE — ED Triage Notes (Signed)
Pt presents via acmes escorted by police with c/o alcohol intoxication. [employer] currently taking out IVC papers. EMS reports ETOH on board. Pt currently yelling "fuck me" repeatedly. Pt aggressive and uncooperative at this time. Vital signs for ems 134/80, 90% on room air, 97bpm, respiration rate 23. MD Jimmye Norman at bedside.

## 2019-08-14 NOTE — ED Provider Notes (Signed)
[facility] Emergency Department Provider Note       Time seen: ----------------------------------------- 2:37 PM on 08/14/2019 -----------------------------------------   I have reviewed the triage vital signs and the nursing notes.  HISTORY   Chief Complaint No chief complaint on file.   HPI Omar Davis is a 69 y.o. male with a history of chronic pain, depression, hypertension, PTSD, traumatic brain injury who presents to the ED for acute agitation and possible intoxication.  Patient arrives extremely agitated and cannot give further review of systems or report.  Past Medical History:  Diagnosis Date  . Chronic pain   . Depressed   . Hypertension   . PTSD (post-traumatic stress disorder)   . TBI (traumatic brain injury) [facility])     Patient Active Problem List   Diagnosis Date Noted  . Iron deficiency anemia 04/08/2019  . Alcohol intoxication with delirium ([place])   . Sepsis ([place]) 02/08/2019  . Essential hypertension 02/05/2019  . Alcohol-induced mood disorder ([place]) 01/24/2019  . Alcohol dependence with intoxication, uncomplicated ([place]) [id number]  . Major depressive disorder, recurrent severe without psychotic features ([place]) 08/24/2018  . Suicidal ideation 09/25/2015  . Alcohol abuse 09/09/2015  . PTSD (post-traumatic stress disorder) 09/09/2015  . Agitation 09/09/2015  . Depressed   . Chronic pain     Past Surgical History:  Procedure Laterality Date  . BACK SURGERY    . HERNIA REPAIR    . KNEE SURGERY    . KNEE SURGERY    . prostectomy N/A     Allergies Codeine, Gabapentin, Lisinopril, and Penicillins  Social History Social History   Tobacco Use  . Smoking status: Never Smoker  . Smokeless tobacco: Never Used  Substance Use Topics  . Alcohol use: Yes  . Drug use: Not Currently    Review of Systems Unknown  All systems negative/normal/unremarkable except as stated in the  HPI  ____________________________________________   PHYSICAL EXAM:  VITAL SIGNS: ED Triage Vitals  Enc Vitals Group     BP      Pulse      Resp      Temp      Temp src      SpO2      Weight      Height      Head Circumference      Peak Flow      Pain Score      Pain Loc      Pain Edu?      Excl. in [place]?     Constitutional: Agitated and combative Eyes: Conjunctivae are normal. Normal extraocular movements. Cardiovascular: Normal rate, regular rhythm. No murmurs, rubs, or gallops. Respiratory: Normal respiratory effort without tachypnea nor retractions. Breath sounds are clear and equal bilaterally. No wheezes/rales/rhonchi. Gastrointestinal: Soft and nontender. Normal bowel sounds Musculoskeletal: Nontender with normal range of motion in extremities. No lower extremity tenderness nor edema. Neurologic: Slurred speech, no gross focal neurologic deficits are appreciated.  Skin:  Skin is warm, dry and intact. No rash noted. Psychiatric: Will not cooperate with examination  ____________________________________________  ED COURSE:  As part of my medical decision making, I reviewed the following data within the [place] History obtained from family if available, nursing notes, old chart and ekg, as well as notes from prior ED visits. Patient presented for extreme agitation, we will assess with labs and imaging as indicated at this time.   Procedures  Omar Davis was evaluated in Emergency Department on 08/14/2019 for  the symptoms described in the history of present illness. He was evaluated in the context of the global COVID-19 pandemic, which necessitated consideration that the patient might be at risk for infection with the SARS-CoV-2 virus that causes COVID-19. Institutional protocols and algorithms that pertain to the evaluation of patients at risk for COVID-19 are in a state of rapid change based on information released by regulatory bodies  including the CDC and federal and state organizations. These policies and algorithms were followed during the patient's care in the ED.  ____________________________________________   LABS (pertinent positives/negatives)  Labs Reviewed  CBC WITH DIFFERENTIAL/PLATELET  COMPREHENSIVE METABOLIC PANEL  ETHANOL   ____________________________________________   DIFFERENTIAL DIAGNOSIS   Alcohol intoxication, PTSD, mood disorder, delirium  FINAL ASSESSMENT AND PLAN  Acute alcohol intoxication, aggression, acute psychosis   Plan: The patient had presented for agitation and involuntary commitment. Patient's labs are still pending, anticipate involuntary commitment and psychiatric evaluation.   Laurence Aly, MD    Note: This note was generated in part or whole with voice recognition software. Voice recognition is usually quite accurate but there are transcription errors that can and very often do occur. I apologize for any typographical errors that were not detected and corrected.     Earleen Newport, MD 08/14/19 [phone]

## 2019-08-14 NOTE — ED Notes (Signed)
IVC prior to arrival/ Consult ordered/ RN & LEO aware of Legal status

## 2019-08-14 NOTE — Consult Note (Signed)
[facility] Psychiatry Consult   Reason for Consult: Alcohol Problems  Referring Physician:  Dr. Jimmye Norman Patient Identification: Omar Davis MRN:  [id number] Principal Diagnosis: <principal problem not specified> Diagnosis:  Active Problems:   Depressed   Chronic pain   Alcohol abuse   PTSD (post-traumatic stress disorder)   Agitation   Suicidal ideation   Alcohol dependence with intoxication, uncomplicated ([place])   Major depressive disorder, recurrent severe without psychotic features ([place])   Alcohol-induced mood disorder ([place])   Essential hypertension   Sepsis ([place])   Alcohol intoxication with delirium ([place])   Iron deficiency anemia   Total Time spent with patient: 30 minutes  Subjective:  " I am okay." [name] is a 69 y.o. male presented [facility] ED via law enforcement under involuntary commitment status (IVC) by the EDP due to the patient being agitated and combative.  Per the ED triage nursing note, " the patient presents via acmes escorted by police with c/o alcohol intoxication. [employer] currently taking out IVC papers. EMS reports ETOH on board. The patient currently yelling "fuck me" repeatedly. The patient is aggressive and uncooperative at this time."  The patient was seen face-to-face by this provider; chart reviewed and consulted with Dr. Quentin Cornwall on 08/14/2019 due to the patient's care. It was discussed with the EDP that the patient does not meet the criteria to be admitted to the psychiatric inpatient unit. The patient is intoxicated and will remain on the observation overnight and discharge in the a.m. once he is no longer inebriated. On evaluation, the patient is alert and oriented x 1-2, calm, resting, able to participate in the assessment process by answering "yes, no" and mood-congruent with affect. The patient does not appear to be responding to internal or external stimuli. Neither is the patient presenting with any delusional thinking. The patient  denies auditory or visual hallucinations. The patient denies suicidal, homicidal, or self-harm ideations. The patient is not presenting with any psychotic or paranoid behaviors. During an encounter with the patient, he was able to answer a few questions appropriately.    Plan: The patient is not a safety risk to himself or others once he sobers up. He does not meet the criteria for psychiatric inpatient admission.  The patient can be discharged in the a.m. HPI: Per Dr. Jimmye Norman: Omar Davis is a 69 y.o. male with a history of chronic pain, depression, hypertension, PTSD, traumatic brain injury who presents to the ED for acute agitation and possible intoxication.  Patient arrives extremely agitated and cannot give further review of systems or report.  Past Psychiatric History: Depression and ETOH abuse  Risk to Self:   No Risk to Others:  No Prior Inpatient Therapy:   Yes Prior Outpatient Therapy:   Yes  Past Medical History:  Past Medical History:  Diagnosis Date  . Chronic pain   . Depressed   . Hypertension   . PTSD (post-traumatic stress disorder)   . TBI (traumatic brain injury) [facility])     Past Surgical History:  Procedure Laterality Date  . BACK SURGERY    . HERNIA REPAIR    . KNEE SURGERY    . KNEE SURGERY    . prostectomy N/A    Family History: History reviewed. No pertinent family history. Family Psychiatric  History: unknown Social History:  Social History   Substance and Sexual Activity  Alcohol Use Yes     Social History   Substance and Sexual Activity  Drug Use Not Currently  Social History   Socioeconomic History  . Marital status: Legally Separated    Spouse name: Not on file  . Number of children: Not on file  . Years of education: Not on file  . Highest education level: Not on file  Occupational History  . Not on file  Tobacco Use  . Smoking status: Never Smoker  . Smokeless tobacco: Never Used  Substance and Sexual Activity  . Alcohol  use: Yes  . Drug use: Not Currently  . Sexual activity: Not Currently  Other Topics Concern  . Not on file  Social History Narrative  . Not on file   Social Determinants of Health   Financial Resource Strain:   . Difficulty of Paying Living Expenses:   Food Insecurity:   . Worried About Charity fundraiser in the Last Year:   . Arboriculturist in the Last Year:   Transportation Needs:   . Film/video editor (Medical):   Marland Kitchen Lack of Transportation (Non-Medical):   Physical Activity:   . Days of Exercise per Week:   . Minutes of Exercise per Session:   Stress:   . Feeling of Stress :   Social Connections:   . Frequency of Communication with Friends and Family:   . Frequency of Social Gatherings with Friends and Family:   . Attends Religious Services:   . Active Member of Clubs or Organizations:   . Attends Archivist Meetings:   Marland Kitchen Marital Status:    Additional Social History:    Allergies:   Allergies  Allergen Reactions  . Codeine Hives and Rash  . Gabapentin Other (See Comments) and Anaphylaxis    seizures Other reaction(s): Other (See Comments) seizures Anaphylactic Shock   . Lisinopril Swelling    Lip Swelling   . Penicillins Rash    Did it involve swelling of the face/tongue/throat, SOB, or low BP? No Did it involve sudden or severe rash/hives, skin peeling, or any reaction on the inside of your mouth or nose? Yes Did you need to seek medical attention at a hospital or doctor's office? No When did it last happen? If all above answers are "NO", may proceed with cephalosporin use.     Labs:  Results for orders placed or performed during the hospital encounter of 08/14/19 (from the past 48 hour(s))  CBC with Differential/Platelet     Status: Abnormal   Collection Time: 08/14/19  3:38 PM  Result Value Ref Range   WBC 7.6 4.0 - 10.5 K/uL   RBC 5.52 4.22 - 5.81 MIL/uL   Hemoglobin 15.0 13.0 - 17.0 g/dL   HCT 44.7 39.0 - 52.0 %   MCV 81.0  80.0 - 100.0 fL   MCH 27.2 26.0 - 34.0 pg   MCHC 33.6 30.0 - 36.0 g/dL   RDW 16.8 (H) 11.5 - 15.5 %   Platelets 210 150 - 400 K/uL   nRBC 0.0 0.0 - 0.2 %   Neutrophils Relative % 67 %   Neutro Abs 5.1 1.7 - 7.7 K/uL   Lymphocytes Relative 19 %   Lymphs Abs 1.4 0.7 - 4.0 K/uL   Monocytes Relative 8 %   Monocytes Absolute 0.6 0.1 - 1.0 K/uL   Eosinophils Relative 4 %   Eosinophils Absolute 0.3 0.0 - 0.5 K/uL   Basophils Relative 1 %   Basophils Absolute 0.1 0.0 - 0.1 K/uL   Immature Granulocytes 1 %   Abs Immature Granulocytes 0.07 0.00 - 0.07 K/uL  Comment: Performed at [facility], [address] [place], [place] [address]  Comprehensive metabolic panel     Status: Abnormal   Collection Time: 08/14/19  3:38 PM  Result Value Ref Range   Sodium 142 135 - 145 mmol/L   Potassium 3.7 3.5 - 5.1 mmol/L   Chloride 109 98 - 111 mmol/L   CO2 21 (L) 22 - 32 mmol/L   Glucose, Bld 112 (H) 70 - 99 mg/dL    Comment: Glucose reference range applies only to samples taken after fasting for at least 8 hours.   BUN 23 8 - 23 mg/dL   Creatinine, Ser 1.11 0.61 - 1.24 mg/dL   Calcium 8.7 (L) 8.9 - 10.3 mg/dL   Total Protein 6.6 6.5 - 8.1 g/dL   Albumin 3.8 3.5 - 5.0 g/dL   AST 29 15 - 41 U/L   ALT 26 0 - 44 U/L   Alkaline Phosphatase 112 38 - 126 U/L   Total Bilirubin 0.6 0.3 - 1.2 mg/dL   GFR calc non Af Amer >60 >60 mL/min   GFR calc Af Amer >60 >60 mL/min   Anion gap 12 5 - 15    Comment: Performed at [facility], [address], [place], [place] [address]  Ethanol     Status: Abnormal   Collection Time: 08/14/19  3:38 PM  Result Value Ref Range   Alcohol, Ethyl (B) 246 (H) <10 mg/dL    Comment: (NOTE) Lowest detectable limit for serum alcohol is 10 mg/dL. For medical purposes only. Performed at [facility], [address] [place], [place] [address]   Urinalysis, Complete w Microscopic     Status: Abnormal   Collection Time: 08/14/19  3:38 PM   Result Value Ref Range   Color, Urine YELLOW (A) YELLOW   APPearance CLEAR (A) CLEAR   Specific Gravity, Urine 1.010 1.005 - 1.030   pH 6.0 5.0 - 8.0   Glucose, UA NEGATIVE NEGATIVE mg/dL   Hgb urine dipstick NEGATIVE NEGATIVE   Bilirubin Urine NEGATIVE NEGATIVE   Ketones, ur NEGATIVE NEGATIVE mg/dL   Protein, ur NEGATIVE NEGATIVE mg/dL   Nitrite NEGATIVE NEGATIVE   Leukocytes,Ua NEGATIVE NEGATIVE   RBC / HPF 0-5 0 - 5 RBC/hpf   WBC, UA 0-5 0 - 5 WBC/hpf   Bacteria, UA NONE SEEN NONE SEEN   Squamous Epithelial / LPF NONE SEEN 0 - 5    Comment: Performed at [facility], [address], [place], [place] [address]    Current Facility-Administered Medications  Medication Dose Route Frequency Provider Last Rate Last Admin  . amLODipine (NORVASC) tablet 10 mg  10 mg Oral Daily Merlyn Lot, MD      . atorvastatin (LIPITOR) tablet 40 mg  40 mg Oral QHS Merlyn Lot, MD   40 mg at 08/14/19 2123  . DULoxetine (CYMBALTA) DR capsule 90 mg  90 mg Oral Daily Merlyn Lot, MD      . hydrOXYzine (ATARAX/VISTARIL) tablet 25-50 mg  25-50 mg Oral QHS Merlyn Lot, MD   25 mg at 08/14/19 2123  . LORazepam (ATIVAN) injection 0-4 mg  0-4 mg Intravenous Q6H Merlyn Lot, MD       Or  . LORazepam (ATIVAN) tablet 0-4 mg  0-4 mg Oral Q6H Merlyn Lot, MD      . Derrill Memo ON 08/17/2019] LORazepam (ATIVAN) injection 0-4 mg  0-4 mg Intravenous Q12H Merlyn Lot, MD       Or  . Derrill Memo ON  08/17/2019] LORazepam (ATIVAN) tablet 0-4 mg  0-4 mg Oral Q12H Merlyn Lot, MD      . thiamine tablet 100 mg  100 mg Oral Daily Merlyn Lot, MD   100 mg at 08/14/19 2123   Or  . thiamine (B-1) injection 100 mg  100 mg Intravenous Daily Merlyn Lot, MD       Current Outpatient Medications  Medication Sig Dispense Refill  . amLODipine (NORVASC) 10 MG tablet Take 10 mg by mouth daily.    Marland Kitchen atorvastatin (LIPITOR) 80 MG tablet Take 40 mg by mouth at bedtime.    .  cyclobenzaprine (FLEXERIL) 10 MG tablet Take 10 mg by mouth 3 (three) times daily as needed for muscle spasms.    . divalproex (DEPAKOTE ER) 500 MG 24 hr tablet Take 2 tablets (1,000 mg total) by mouth 2 (two) times daily. (Patient not taking: Reported on 04/05/2019) 120 tablet 0  . DULoxetine (CYMBALTA) 30 MG capsule Take 1 capsule (30 mg total) by mouth daily. (Patient taking differently: Take 90 mg by mouth daily. ) 30 capsule 0  . hydrOXYzine (ATARAX/VISTARIL) 25 MG tablet Take 25-50 mg by mouth at bedtime.    Marland Kitchen lisinopril (ZESTRIL) 10 MG tablet Take 1 tablet (10 mg total) by mouth daily. (Patient not taking: Reported on 04/05/2019) 30 tablet 0  . naproxen (NAPROSYN) 500 MG tablet Take 1 tablet (500 mg total) by mouth 2 (two) times daily as needed for moderate pain. 60 tablet 0  . pantoprazole (PROTONIX) 40 MG tablet Take 40 mg by mouth 2 (two) times daily.    . pravastatin (PRAVACHOL) 80 MG tablet Take 1 tablet (80 mg total) by mouth at bedtime. (Patient not taking: Reported on 08/08/2019) 30 tablet 0  . thiamine 100 MG tablet Take 1 tablet (100 mg total) by mouth daily. (Patient not taking: Reported on 03/03/2019) 30 tablet 0  . traZODone (DESYREL) 100 MG tablet Take 100-200 mg by mouth at bedtime as needed for sleep.    Marland Kitchen triamcinolone cream (KENALOG) 0.1 % Apply 1 application topically 2 (two) times daily.      Musculoskeletal: Strength & Muscle Tone: within normal limits Gait & Station: unable to assess Patient leans: N/A  Psychiatric Specialty Exam: Physical Exam  Nursing note and vitals reviewed. Constitutional: He is oriented to person, place, and time. He appears well-developed.  HENT:  Head: Normocephalic.  Respiratory: Effort normal and breath sounds normal.  Musculoskeletal:        General: Normal range of motion.     Cervical back: Normal range of motion.  Neurological: He is alert and oriented to person, place, and time.  Skin: Skin is warm and dry.  Psychiatric: He has  a normal mood and affect. His speech is normal. Cognition and memory are normal.    Review of Systems  Psychiatric/Behavioral: Negative.   All other systems reviewed and are negative.   Blood pressure 123/66, pulse 73, temperature 98.1 F (36.7 C), temperature source Oral, resp. rate 16, height 5\' 7"  (1.702 m), weight 81.6 kg, SpO2 97 %.Body mass index is 28.19 kg/m.  General Appearance: Casual  Eye Contact:  Good  Speech:  Garbled and Slurred  Volume:  Decreased  Mood:  Anxious, Depressed and Dysphoric  Affect:  Congruent, Constricted, Depressed, Flat and Inappropriate  Thought Process:  Goal Directed and Descriptions of Associations: Tangential  Orientation:  Full (Time, Place, and Person)  Thought Content:  Tangential  Suicidal Thoughts:  No  Homicidal Thoughts:  No  Memory:  NA  Judgement:  Good  Insight:  Good  Psychomotor Activity:  Normal  Concentration:  Concentration: Good  Recall:  Good  Fund of Knowledge:  Good  Language:  Good  Akathisia:  NA  Handed:  Right  AIMS (if indicated):     Assets:  Communication Skills Social Support  ADL's:  Intact  Cognition:  WNL  Sleep:    Okay     Treatment Plan Summary: The patient is psychiatrically cleared and can be discharged in the a.m. when he is no longer under the influence of alcohol.  Disposition: No evidence of imminent risk to self or others at present.   Patient does not meet criteria for psychiatric inpatient admission. Discussed crisis plan, support from social network, calling 911, coming to the Emergency Department, and calling Suicide Hotline.  Caroline Sauger, NP 08/14/2019 11:00 PM

## 2019-08-14 NOTE — ED Notes (Signed)
Pt's bed was wet. Pt was dressed in clean scrubs and bedding was changed.

## 2019-08-15 NOTE — ED Provider Notes (Signed)
-----------------------------------------   8:14 AM on 08/15/2019 -----------------------------------------   Blood pressure 123/66, pulse 73, temperature 98.1 F (36.7 C), temperature source Oral, resp. rate 16, height 5\' 7"  (1.702 m), weight 81.6 kg, SpO2 97 %.  The patient is calm and cooperative at this time.  There have been no acute events since the last update.  Patient seen and cleared by Psychiatry. IVC rescinded. He is now sober, awake, alert, denies any SI, HI, AVH. He has called for a ride, is ambulatory w/o difficulty.   Duffy Bruce, MD 08/15/19 [phone]

## 2019-08-15 NOTE — ED Provider Notes (Signed)
Emergency Medicine Observation Re-evaluation Note  Kaeo Mcnair is a 69 y.o. male, seen on rounds today.  Pt initially presented to the ED for complaints of Psychiatric Evaluation Currently, the patient is to reevaluate patient in the morning as long as patient is not intoxicated then hopefully discharge home.  Physical Exam  BP 123/66   Pulse 73   Temp 98.1 F (36.7 C) (Oral)   Resp 16   Ht 5\' 7"  (1.702 m)   Wt 81.6 kg   SpO2 97%   BMI 28.19 kg/m  Physical Exam Patient is asleep which precludes further examination.  Patient does not appear in any distress. ED Course / MDM   Labs reviewed and reassuring.  Only notable for a alcohol level of 246  Plan  Current plan is for reevaluation in a.m. Patient is under full IVC at this time.   Vanessa Whiteash, MD 08/15/19 [phone]

## 2019-08-15 NOTE — ED Notes (Signed)
Pt. Up using bathroom, pt. Returned to room with steady gait. 

## 2019-08-15 NOTE — ED Notes (Signed)
Pt discharged home in a cab. Pt refused D/C VS because he didn't want to miss his cab. Pt denies SI/HI. Pt with steady gait.  All belongings returned to patient. Discharge instructions reviewed with patient.

## 2019-08-15 NOTE — ED Notes (Signed)
Pt states he is ready to leave and has money for a cab ride home. EDP made aware.

## 2019-08-17 ENCOUNTER — Other Ambulatory Visit: Payer: Self-pay

## 2019-08-17 ENCOUNTER — Emergency Department
Admission: EM | Admit: 2019-08-17 | Discharge: 2019-08-18 | Disposition: A | Discharge status: Home / Self Care | Payer: No Typology Code available for payment source | Attending: Emergency Medicine | Admitting: Emergency Medicine

## 2019-08-17 ENCOUNTER — Encounter: Payer: Self-pay | Admitting: Emergency Medicine

## 2019-08-17 DIAGNOSIS — S50811A Abrasion of right forearm, initial encounter: Secondary | ICD-10-CM | POA: Insufficient documentation

## 2019-08-17 DIAGNOSIS — S50812A Abrasion of left forearm, initial encounter: Secondary | ICD-10-CM | POA: Insufficient documentation

## 2019-08-17 DIAGNOSIS — Y906 Blood alcohol level of 120-199 mg/100 ml: Secondary | ICD-10-CM | POA: Diagnosis not present

## 2019-08-17 DIAGNOSIS — Y929 Unspecified place or not applicable: Secondary | ICD-10-CM | POA: Insufficient documentation

## 2019-08-17 DIAGNOSIS — R45851 Suicidal ideations: Secondary | ICD-10-CM

## 2019-08-17 DIAGNOSIS — I10 Essential (primary) hypertension: Secondary | ICD-10-CM | POA: Diagnosis not present

## 2019-08-17 DIAGNOSIS — Z7289 Other problems related to lifestyle: Secondary | ICD-10-CM

## 2019-08-17 DIAGNOSIS — X781XXA Intentional self-harm by knife, initial encounter: Secondary | ICD-10-CM | POA: Diagnosis not present

## 2019-08-17 DIAGNOSIS — F10129 Alcohol abuse with intoxication, unspecified: Secondary | ICD-10-CM | POA: Insufficient documentation

## 2019-08-17 DIAGNOSIS — Z79899 Other long term (current) drug therapy: Secondary | ICD-10-CM | POA: Diagnosis not present

## 2019-08-17 DIAGNOSIS — Y9389 Activity, other specified: Secondary | ICD-10-CM | POA: Diagnosis not present

## 2019-08-17 DIAGNOSIS — T07XXXA Unspecified multiple injuries, initial encounter: Secondary | ICD-10-CM

## 2019-08-17 DIAGNOSIS — F4310 Post-traumatic stress disorder, unspecified: Secondary | ICD-10-CM | POA: Diagnosis not present

## 2019-08-17 DIAGNOSIS — IMO0002 Reserved for concepts with insufficient information to code with codable children: Secondary | ICD-10-CM

## 2019-08-17 DIAGNOSIS — Z87820 Personal history of traumatic brain injury: Secondary | ICD-10-CM | POA: Insufficient documentation

## 2019-08-17 DIAGNOSIS — Y999 Unspecified external cause status: Secondary | ICD-10-CM | POA: Insufficient documentation

## 2019-08-17 DIAGNOSIS — F1010 Alcohol abuse, uncomplicated: Secondary | ICD-10-CM

## 2019-08-17 LAB — CBC
HCT: 50 % (ref 39.0–52.0)
Hemoglobin: 16.7 g/dL (ref 13.0–17.0)
MCH: 27.1 pg (ref 26.0–34.0)
MCHC: 33.4 g/dL (ref 30.0–36.0)
MCV: 81.2 fL (ref 80.0–100.0)
Platelets: 308 10*3/uL (ref 150–400)
RBC: 6.16 MIL/uL — ABNORMAL HIGH (ref 4.22–5.81)
RDW: 16.9 % — ABNORMAL HIGH (ref 11.5–15.5)
WBC: 11.1 10*3/uL — ABNORMAL HIGH (ref 4.0–10.5)
nRBC: 0 % (ref 0.0–0.2)

## 2019-08-17 LAB — COMPREHENSIVE METABOLIC PANEL
ALT: 31 U/L (ref 0–44)
AST: 40 U/L (ref 15–41)
Albumin: 4.2 g/dL (ref 3.5–5.0)
Alkaline Phosphatase: 131 U/L — ABNORMAL HIGH (ref 38–126)
Anion gap: 12 (ref 5–15)
BUN: 20 mg/dL (ref 8–23)
CO2: 20 mmol/L — ABNORMAL LOW (ref 22–32)
Calcium: 9.8 mg/dL (ref 8.9–10.3)
Chloride: 112 mmol/L — ABNORMAL HIGH (ref 98–111)
Creatinine, Ser: 1.03 mg/dL (ref 0.61–1.24)
GFR calc Af Amer: >60 mL/min (ref >60)
GFR calc non Af Amer: >60 mL/min (ref >60)
Glucose, Bld: 101 mg/dL — ABNORMAL HIGH (ref 70–99)
Potassium: 3.6 mmol/L (ref 3.5–5.1)
Sodium: 144 mmol/L (ref 135–145)
Total Bilirubin: 0.6 mg/dL (ref 0.3–1.2)
Total Protein: 7.6 g/dL (ref 6.5–8.1)

## 2019-08-17 LAB — ETHANOL: Alcohol, Ethyl (B): 148 mg/dL — ABNORMAL HIGH (ref <10)

## 2019-08-17 NOTE — ED Triage Notes (Signed)
Pt ambulatory to Rm 22 from EMS bay with [facility] who report pt called out for suicidal feelings. Pt has several superficial cuts to both wrist he reports he did with a razor blade. Pt does admit to drinking wine today. Per EMS pt was in a rage when they arrived wanting to fight but at this time pt is calm and cooperative. Pt reports to this RN he is upset due to braking up with his girlfriend last week.

## 2019-08-18 MED ORDER — BACITRACIN ZINC 500 UNIT/GM EX OINT: TOPICAL_OINTMENT | Freq: Once | CUTANEOUS | Status: AC

## 2019-08-18 NOTE — ED Notes (Signed)
Pt ambulatory to lobby with steady gait and appears to be in NAD. Pt given back belongings. Pt calling taxi to go home.

## 2019-08-18 NOTE — ED Provider Notes (Signed)
[facility] Emergency Department Provider Note  ____________________________________________   First MD Initiated Contact with Patient 08/17/19 2313     (approximate)  I have reviewed the triage vital signs and the nursing notes.   HISTORY  Chief Complaint Suicidal  Level 5 caveat:  history/ROS limited by acute intoxication and chronic mental illness.   HPI Omar Davis is a 69 y.o. male  with extensive psychiatric history and numerous visits to this ED for intoxication and transient suicidal thoughts.  He presents tonight voluntarily with law enforcement for suicidal thoughts and self-harm.  He says that he is upset and angry because his girlfriend left him and he took a razor blade and cut both forearms in numerous places.  He is agitated but considerably more cooperative than usual when he arrives in the ED. By the time I saw him (about an hour after ED arrival), he no longer says that he wants to hurt himself and is asking to go home.  He says that he was upset at his girlfriend and that the government for messing him up (he is a veteran with a TBI and PTSD).  He does not want to hurt anyone now and he admits to cutting himself with a razor but no longer says he wants to kill himself.  Medical records indicate that his last tetanus vaccination was in 2020, less than a year ago.      Past Medical History:  Diagnosis Date  . Chronic pain   . Depressed   . Hypertension   . PTSD (post-traumatic stress disorder)   . TBI (traumatic brain injury) [facility])     Patient Active Problem List   Diagnosis Date Noted  . Iron deficiency anemia 04/08/2019  . Alcohol intoxication with delirium ([place])   . Sepsis ([place]) 02/08/2019  . Essential hypertension 02/05/2019  . 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 Alcohol dependence with intoxication, uncomplicated ([place]) [id number]  . Major depressive disorder, recurrent severe without psychotic features  ([place]) 08/24/2018  . Suicidal ideation 09/25/2015  . Alcohol abuse 09/09/2015  . PTSD (post-traumatic stress disorder) 09/09/2015  . Agitation 09/09/2015  . Depressed   . Chronic pain     Past Surgical History:  Procedure Laterality Date  . BACK SURGERY    . HERNIA REPAIR    . KNEE SURGERY    . KNEE SURGERY    . prostectomy N/A     Prior to Admission medications   Medication Sig Start Date End Date Taking? Authorizing Provider  amLODipine (NORVASC) 10 MG tablet Take 10 mg by mouth daily.    [provider]  atorvastatin (LIPITOR) 80 MG tablet Take 40 mg by mouth at bedtime.    [provider]  cyclobenzaprine (FLEXERIL) 10 MG tablet Take 10 mg by mouth 3 (three) times daily as needed for muscle spasms.    [provider]  divalproex (DEPAKOTE ER) 500 MG 24 hr tablet Take 2 tablets (1,000 mg total) by mouth 2 (two) times daily. Patient not taking: Reported on 04/05/2019 02/07/19   Clapacs, Madie Reno, MD  DULoxetine (CYMBALTA) 30 MG capsule Take 1 capsule (30 mg total) by mouth daily. Patient taking differently: Take 90 mg by mouth daily.  02/07/19   Clapacs, Madie Reno, MD  hydrOXYzine (ATARAX/VISTARIL) 25 MG tablet Take 25-50 mg by mouth at bedtime.    [provider]  lisinopril (ZESTRIL) 10 MG tablet Take 1 tablet (10 mg total) by mouth daily. Patient not taking: Reported  on 04/05/2019 02/07/19   Clapacs, Madie Reno, MD  naproxen (NAPROSYN) 500 MG tablet Take 1 tablet (500 mg total) by mouth 2 (two) times daily as needed for moderate pain. 02/07/19   Clapacs, Madie Reno, MD  pantoprazole (PROTONIX) 40 MG tablet Take 40 mg by mouth 2 (two) times daily.    [provider]  pravastatin (PRAVACHOL) 80 MG tablet Take 1 tablet (80 mg total) by mouth at bedtime. Patient not taking: Reported on 08/08/2019 02/07/19   Clapacs, Madie Reno, MD  thiamine 100 MG tablet Take 1 tablet (100 mg total) by mouth daily. Patient not taking: Reported on 03/03/2019 02/07/19   Clapacs,  Madie Reno, MD  traZODone (DESYREL) 100 MG tablet Take 100-200 mg by mouth at bedtime as needed for sleep.    [provider]  triamcinolone cream (KENALOG) 0.1 % Apply 1 application topically 2 (two) times daily. 07/23/19   [provider]    Allergies Codeine, Gabapentin, Lisinopril, and Penicillins  History reviewed. No pertinent family history.  Social History Social History   Tobacco Use  . Smoking status: Never Smoker  . Smokeless tobacco: Never Used  Substance Use Topics  . Alcohol use: Yes  . Drug use: Not Currently    Review of Systems Level 5 caveat:  history/ROS limited by acute intoxication and chronic mental illness.  Constitutional: No fever/chills Eyes: No visual changes. ENT: No sore throat. Cardiovascular: Denies chest pain. Respiratory: Denies shortness of breath. Gastrointestinal: No abdominal pain.  No nausea, no vomiting.  No diarrhea.  No constipation. Genitourinary: Negative for dysuria. Musculoskeletal: Negative for neck pain.  Negative for back pain. Integumentary: Multiple self-inflicted lacerations to both forearms. Neurological: Negative for headaches, focal weakness or numbness. Psychiatric:  Agitated and intoxicated, initially reporting suicidal ideation but now denying.  No homicidal ideation.  ____________________________________________   PHYSICAL EXAM:  VITAL SIGNS: ED Triage Vitals  Enc Vitals Group     BP 08/17/19 2329 (!) 159/123     Pulse Rate 08/17/19 2329 (!) 108     Resp 08/17/19 2329 18     Temp 08/17/19 2329 99.1 F (37.3 C)     Temp src --      SpO2 08/17/19 2329 99 %     Weight 08/17/19 2330 81.6 kg (180 lb)     Height 08/17/19 2330 1.727 m (5\' 8" )     Head Circumference --      Peak Flow --      Pain Score 08/17/19 2330 3     Pain Loc --      Pain Edu? --      Excl. in [place]? --     Constitutional: Alert and interactive.  Essentially at his baseline and more cooperative than usual.  Agitated but not  dangerously so. Eyes: Conjunctivae are normal.  Head: Atraumatic. Nose: No congestion/rhinnorhea. Mouth/Throat: Patient is wearing a mask. Neck: No stridor.  No meningeal signs.   Cardiovascular: Mild tachycardia, regular rhythm. Good peripheral circulation. Grossly normal heart sounds. Respiratory: Normal respiratory effort.  No retractions. Gastrointestinal: Soft and nontender. No distention.  Musculoskeletal: No lower extremity tenderness nor edema. No gross deformities of extremities. Neurologic:  Normal speech and language. No gross focal neurologic deficits are appreciated.  Skin:  Skin is warm, dry.  He has numerous superficial wounds to both forearms but none of them are actively bleeding and none of them require repair.  There is no benefit to skin adhesive nor Steri-Strips. Psychiatric: Mood and affect are agitated  but he is currently denying SI and HI that he admits to SI earlier and admits to self-harm.  He claims he took a razor blade to both of his forearms but no longer wants to hurt himself.  ____________________________________________   LABS (all labs ordered are listed, but only abnormal results are displayed)  Labs Reviewed  COMPREHENSIVE METABOLIC PANEL - Abnormal; Notable for the following components:      Result Value   Chloride 112 (*)    CO2 20 (*)    Glucose, Bld 101 (*)    Alkaline Phosphatase 131 (*)    All other components within normal limits  ETHANOL - Abnormal; Notable for the following components:   Alcohol, Ethyl (B) 148 (*)    All other components within normal limits  CBC - Abnormal; Notable for the following components:   WBC 11.1 (*)    RBC 6.16 (*)    RDW 16.9 (*)    All other components within normal limits  URINE DRUG SCREEN, QUALITATIVE ([facility] ONLY)   ____________________________________________  EKG  No indication for EKG ____________________________________________  RADIOLOGY I, Hinda Kehr, personally viewed and evaluated these  images (plain radiographs) as part of my medical decision making, as well as reviewing the written report by the radiologist.  ED MD interpretation: No indication for emergent imaging  Official radiology report(s): No results found.  ____________________________________________   PROCEDURES   Procedure(s) performed (including Critical Care):  Procedures   ____________________________________________   INITIAL IMPRESSION / MDM / [place] / ED COURSE  As part of my medical decision making, I reviewed the following data within the [place] notes reviewed and incorporated, Labs reviewed , Old chart reviewed, Notes from prior ED visits and [place] Controlled Substance Database   Differential diagnosis includes, but is not limited to, chronic mental illness including PTSD, depression, substance abuse, substance-induced mood disorder.  The patient has no need for any intervention on his arms.  Lab work is unremarkable and he is acutely intoxicated although he has been considerably more intoxicated in the past.  No sign of alcohol withdrawal at this time and I think that his mild tachycardia is due to his agitation.  He is currently not indicated he wants to hurt himself.  He says that he wants to leave but I encouraged him to stay overnight as he usually does and we will reassess him in the morning.  Given his numerous psychiatric assessments, I do not think that he would benefit from another psychiatric evaluation at this time although the self-inflicted injury is new behavior as far as I am aware.  But he has once again changed his story and says he no longer hurts himself and just wants to go home and get some rest.  I do not feel that he meets admission criteria currently but we can reassess this issue if his behavior changes and as his psych observation indicates.  The patient has been placed in psychiatric observation due to the need to provide a safe  environment for the patient while obtaining psychiatric consultation and evaluation, as well as ongoing medical and medication management to treat the patient's condition.  The patient has not been placed under full IVC at this time though this may be readdressed as needed.       Clinical Course as of Aug 18 707  Sun Aug 18, 2019  0709 Patient has been sleeping all night.  Ready for discharge when awake and clinically sober if  he is not endorsing any thoughts of hurting himself or anyone else.   [CF]    Clinical Course User Index [CF] Hinda Kehr, MD     ____________________________________________  FINAL CLINICAL IMPRESSION(S) / ED DIAGNOSES  Final diagnoses:  Alcohol abuse  Suicidal thoughts  Self-inflicted injury  Abrasions of multiple sites     MEDICATIONS GIVEN DURING THIS VISIT:  Medications  bacitracin ointment (has no administration in time range)     ED Discharge Orders    None      *Please note:  Heraclio Fickle was evaluated in Emergency Department on 08/18/2019 for the symptoms described in the history of present illness. He was evaluated in the context of the global COVID-19 pandemic, which necessitated consideration that the patient might be at risk for infection with the SARS-CoV-2 virus that causes COVID-19. Institutional protocols and algorithms that pertain to the evaluation of patients at risk for COVID-19 are in a state of rapid change based on information released by regulatory bodies including the CDC and federal and state organizations. These policies and algorithms were followed during the patient's care in the ED.  Some ED evaluations and interventions may be delayed as a result of limited staffing during the pandemic.*  Note:  This document was prepared using Dragon voice recognition software and may include unintentional dictation errors.   Hinda Kehr, MD 08/18/19 [phone]

## 2019-08-18 NOTE — ED Provider Notes (Signed)
-----------------------------------------   7:13 AM on 08/18/2019 -----------------------------------------  Blood pressure (!) 159/123, pulse (!) 108, temperature 99.1 F (37.3 C), resp. rate 18, height 5\' 8"  (1.727 m), weight 81.6 kg, SpO2 99 %.  Assuming care from Dr. Karma Greaser.  In short, Omar Davis is a 69 y.o. male with a chief complaint of Suicidal .  Refer to the original H&P for additional details.  The current plan of care is to reassess once clinically sober for any ongoing SI, discharge home if he continues to deny ongoing thoughts of harming himself.  ----------------------------------------- 9:04 AM on 08/18/2019 -----------------------------------------  On reassessment, patient is calm and cooperative, appears clinically sober and is walking with a steady gait.  He denies any ongoing thoughts of harming himself, does have a history of recurrent SI when intoxicated but later recants and has been cleared by psychiatry on multiple occasions.  He is appropriate for discharge home, dressings in place for superficial arm lacerations.    Blake Divine, MD 08/18/19 [phone]

## 2019-08-18 NOTE — Discharge Instructions (Signed)
Keep your wounds clean and dry and use a thin layer of antibiotic ointment on them twice daily.  Follow up with your [place] doctors.

## 2019-08-18 NOTE — ED Notes (Signed)
Pt offered his breakfast tray. Pt refused and stated "I am ready to go." Pt informed that I will make MD aware.

## 2019-08-18 NOTE — ED Notes (Signed)
Documentation of TDAP on 06/12/18 found in chart and Dr Karma Greaser informed.

## 2019-08-18 NOTE — ED Notes (Signed)
Pt ambulatory to bathroom

## 2019-08-20 ENCOUNTER — Emergency Department
Admission: EM | Admit: 2019-08-20 | Discharge: 2019-08-21 | Disposition: A | Discharge status: Home / Self Care | Payer: No Typology Code available for payment source | Attending: Student in an Organized Health Care Education/Training Program | Admitting: Student in an Organized Health Care Education/Training Program

## 2019-08-20 ENCOUNTER — Other Ambulatory Visit: Payer: Self-pay

## 2019-08-20 DIAGNOSIS — Z7289 Other problems related to lifestyle: Secondary | ICD-10-CM | POA: Diagnosis not present

## 2019-08-20 DIAGNOSIS — F10220 Alcohol dependence with intoxication, uncomplicated: Secondary | ICD-10-CM | POA: Diagnosis present

## 2019-08-20 DIAGNOSIS — Y907 Blood alcohol level of 200-239 mg/100 ml: Secondary | ICD-10-CM | POA: Insufficient documentation

## 2019-08-20 DIAGNOSIS — G8929 Other chronic pain: Secondary | ICD-10-CM | POA: Diagnosis present

## 2019-08-20 DIAGNOSIS — D509 Iron deficiency anemia, unspecified: Secondary | ICD-10-CM | POA: Diagnosis present

## 2019-08-20 DIAGNOSIS — F332 Major depressive disorder, recurrent severe without psychotic features: Secondary | ICD-10-CM | POA: Diagnosis present

## 2019-08-20 DIAGNOSIS — F1020 Alcohol dependence, uncomplicated: Secondary | ICD-10-CM | POA: Diagnosis not present

## 2019-08-20 DIAGNOSIS — R451 Restlessness and agitation: Secondary | ICD-10-CM | POA: Insufficient documentation

## 2019-08-20 DIAGNOSIS — R456 Violent behavior: Secondary | ICD-10-CM | POA: Insufficient documentation

## 2019-08-20 DIAGNOSIS — Z79899 Other long term (current) drug therapy: Secondary | ICD-10-CM | POA: Diagnosis not present

## 2019-08-20 DIAGNOSIS — Z046 Encounter for general psychiatric examination, requested by authority: Secondary | ICD-10-CM | POA: Diagnosis not present

## 2019-08-20 DIAGNOSIS — R45851 Suicidal ideations: Secondary | ICD-10-CM | POA: Diagnosis not present

## 2019-08-20 DIAGNOSIS — F1094 Alcohol use, unspecified with alcohol-induced mood disorder: Secondary | ICD-10-CM | POA: Diagnosis present

## 2019-08-20 DIAGNOSIS — R4182 Altered mental status, unspecified: Secondary | ICD-10-CM | POA: Diagnosis present

## 2019-08-20 DIAGNOSIS — F32A Depression, unspecified: Secondary | ICD-10-CM | POA: Diagnosis present

## 2019-08-20 DIAGNOSIS — Z20822 Contact with and (suspected) exposure to covid-19: Secondary | ICD-10-CM | POA: Diagnosis not present

## 2019-08-20 DIAGNOSIS — A419 Sepsis, unspecified organism: Secondary | ICD-10-CM | POA: Diagnosis present

## 2019-08-20 DIAGNOSIS — F1010 Alcohol abuse, uncomplicated: Secondary | ICD-10-CM

## 2019-08-20 DIAGNOSIS — I10 Essential (primary) hypertension: Secondary | ICD-10-CM | POA: Diagnosis not present

## 2019-08-20 DIAGNOSIS — F10921 Alcohol use, unspecified with intoxication delirium: Secondary | ICD-10-CM | POA: Diagnosis present

## 2019-08-20 DIAGNOSIS — F329 Major depressive disorder, single episode, unspecified: Secondary | ICD-10-CM | POA: Diagnosis present

## 2019-08-20 DIAGNOSIS — F4310 Post-traumatic stress disorder, unspecified: Secondary | ICD-10-CM | POA: Diagnosis present

## 2019-08-20 LAB — COMPREHENSIVE METABOLIC PANEL
ALT: 29 U/L (ref 0–44)
AST: 41 U/L (ref 15–41)
Albumin: 4.5 g/dL (ref 3.5–5.0)
Alkaline Phosphatase: 144 U/L — ABNORMAL HIGH (ref 38–126)
Anion gap: 18 — ABNORMAL HIGH (ref 5–15)
BUN: 28 mg/dL — ABNORMAL HIGH (ref 8–23)
CO2: 17 mmol/L — ABNORMAL LOW (ref 22–32)
Calcium: 9.9 mg/dL (ref 8.9–10.3)
Chloride: 109 mmol/L (ref 98–111)
Creatinine, Ser: 1.63 mg/dL — ABNORMAL HIGH (ref 0.61–1.24)
GFR calc Af Amer: 49 mL/min — ABNORMAL LOW (ref >60)
GFR calc non Af Amer: 42 mL/min — ABNORMAL LOW (ref >60)
Glucose, Bld: 122 mg/dL — ABNORMAL HIGH (ref 70–99)
Potassium: 4.1 mmol/L (ref 3.5–5.1)
Sodium: 144 mmol/L (ref 135–145)
Total Bilirubin: 1.2 mg/dL (ref 0.3–1.2)
Total Protein: 8 g/dL (ref 6.5–8.1)

## 2019-08-20 LAB — CBC WITH DIFFERENTIAL/PLATELET
Abs Immature Granulocytes: 0.11 10*3/uL — ABNORMAL HIGH (ref 0.00–0.07)
Basophils Absolute: 0.2 10*3/uL — ABNORMAL HIGH (ref 0.0–0.1)
Basophils Relative: 1 %
Eosinophils Absolute: 0.3 10*3/uL (ref 0.0–0.5)
Eosinophils Relative: 2 %
HCT: 53.7 % — ABNORMAL HIGH (ref 39.0–52.0)
Hemoglobin: 18.4 g/dL — ABNORMAL HIGH (ref 13.0–17.0)
Immature Granulocytes: 1 %
Lymphocytes Relative: 19 %
Lymphs Abs: 3 10*3/uL (ref 0.7–4.0)
MCH: 27.4 pg (ref 26.0–34.0)
MCHC: 34.3 g/dL (ref 30.0–36.0)
MCV: 80 fL (ref 80.0–100.0)
Monocytes Absolute: 1.6 10*3/uL — ABNORMAL HIGH (ref 0.1–1.0)
Monocytes Relative: 11 %
Neutro Abs: 10.4 10*3/uL — ABNORMAL HIGH (ref 1.7–7.7)
Neutrophils Relative %: 66 %
Platelets: 344 10*3/uL (ref 150–400)
RBC: 6.71 MIL/uL — ABNORMAL HIGH (ref 4.22–5.81)
RDW: 18.2 % — ABNORMAL HIGH (ref 11.5–15.5)
WBC: 15.5 10*3/uL — ABNORMAL HIGH (ref 4.0–10.5)
nRBC: 0 % (ref 0.0–0.2)

## 2019-08-20 LAB — RESPIRATORY PANEL BY RT PCR (FLU A&B, COVID)
Influenza A by PCR: NEGATIVE
Influenza B by PCR: NEGATIVE
SARS Coronavirus 2 by RT PCR: NEGATIVE

## 2019-08-20 LAB — ETHANOL: Alcohol, Ethyl (B): 235 mg/dL — ABNORMAL HIGH (ref <10)

## 2019-08-20 MED ORDER — THIAMINE HCL 100 MG PO TABS
100.0000 mg | ORAL_TABLET | Freq: Every day | ORAL | Status: DC
  Administered 2019-08-21: 100 mg via ORAL
  Filled 2019-08-20: qty 1

## 2019-08-20 MED ORDER — THIAMINE HCL 100 MG/ML IJ SOLN
100.0000 mg | Freq: Every day | INTRAMUSCULAR | Status: DC
  Administered 2019-08-20: 100 mg via INTRAVENOUS
  Filled 2019-08-20: qty 2

## 2019-08-20 MED ORDER — LORAZEPAM 2 MG PO TABS: 0.0000 mg | ORAL_TABLET | Freq: Four times a day (QID) | ORAL | Status: DC

## 2019-08-20 MED ORDER — LORAZEPAM 2 MG/ML IJ SOLN
1.0000 mg | Freq: Once | INTRAMUSCULAR | Status: AC
  Administered 2019-08-20: 1 mg via INTRAVENOUS
  Filled 2019-08-20: qty 1

## 2019-08-20 MED ORDER — SODIUM CHLORIDE 0.9 % IV BOLUS
500.0000 mL | Freq: Once | INTRAVENOUS | Status: AC
  Administered 2019-08-20: 500 mL via INTRAVENOUS

## 2019-08-20 MED ORDER — LORAZEPAM 2 MG PO TABS: 0.0000 mg | ORAL_TABLET | Freq: Two times a day (BID) | ORAL | Status: DC

## 2019-08-20 MED ORDER — LORAZEPAM 2 MG/ML IJ SOLN: 0.0000 mg | Freq: Four times a day (QID) | INTRAMUSCULAR | Status: DC

## 2019-08-20 MED ORDER — ACETAMINOPHEN 500 MG PO TABS
1000.0000 mg | ORAL_TABLET | Freq: Once | ORAL | Status: AC
  Administered 2019-08-20: 1000 mg via ORAL
  Filled 2019-08-20: qty 2

## 2019-08-20 MED ORDER — DROPERIDOL 2.5 MG/ML IJ SOLN
5.0000 mg | Freq: Once | INTRAMUSCULAR | Status: AC
  Administered 2019-08-20: 5 mg via INTRAMUSCULAR
  Filled 2019-08-20: qty 2

## 2019-08-20 MED ORDER — LORAZEPAM 2 MG/ML IJ SOLN: 0.0000 mg | Freq: Two times a day (BID) | INTRAMUSCULAR | Status: DC

## 2019-08-20 NOTE — ED Notes (Signed)
Hourly rounding reveals patient sleeping in room. No complaints, stable, in no acute distress. Q15 minute rounds and monitoring via Rover and Officer to continue.  

## 2019-08-20 NOTE — ED Notes (Signed)
Report to include Situation, Background, Assessment, and Recommendations received from [place]. Patient alert, warm and dry, in no acute distress. Patient denies SI, HI, AVH and pain. Patient made aware of Q15 minute rounds and Engineer, drilling presence for their safety. Patient instructed to come to me with needs or concerns.

## 2019-08-20 NOTE — ED Notes (Signed)
Urine sent to lab at this time.

## 2019-08-20 NOTE — ED Notes (Signed)
Pt  Placed  Under  IVC  PAPERS  INFORMED  RN  AMY  TEAGUE

## 2019-08-20 NOTE — ED Triage Notes (Signed)
He arrives today via [employer] from his hotel  Pt verbalizing suicidal ideation and reports that he drank a pint of wine this am   Bilateral wrists unwrapped  - multiple superficial cuts noted to wrists  No repair required

## 2019-08-20 NOTE — Progress Notes (Signed)
Droperidol 5mg  ordered for 69yo male with not recent labs or EKG. Discussed concern with RN, order warnings overridden by ED provider.   Per RN MD wants medication given stat and she will make sure EKG ordered and patient is closely monitored after med administration.   Black Box Warning: Administer droperidol with extreme caution to patients who may be at risk for development of prolonged QT syndrome (eg, congestive heart failure, bradycardia, use of a diuretic, cardiac hypertrophy, hypokalemia, hypomagnesemia, or administration of other drugs known to increase the QT interval). Other risk factors may include age greater than 64 years, alcohol abuse, and use of agents such as benzodiazepines, volatile anesthetics, and IV opiates. Initiate droperidol at a low dose and adjust upward, with caution, as needed to achieve the desired effect.  Pernell Dupre, PharmD, BCPS Clinical Pharmacist 08/20/2019 11:47 AM

## 2019-08-20 NOTE — BH Assessment (Signed)
Tele Assessment Note   Patient Name: Nelse Hoague MRN: [id number] Referring Physician: Quentin Cornwall Location of Patient: [facility] Location of Provider: [place] Department  Per EDP Report:  Wisdom Chavira is a 69 y.o. male well-known to this facility presents the ER acutely agitated intoxicated threatening staff. He is with Engineer, structural.  Also has evidence of superficial lacerations to bilateral forearms.  States he wants to die.    TTS Assessment: Patient is well known to [facility] as a chronic alcoholic who is homeless and has multiple medical issues including a TBI.  Patient has been in the ED at [facility] at least 16 times since the first of October for alcohol related problems.  Patient is a English as a second language teacher and he is plugged into services for his depression at the [facility].  Patient states that he is compliant with taking his medications. However, due to patient's lying circumstances, it is not likely that he is telling the truth including the fact that he states that he is drinking a bottle of wine daily along with some vodka.  Patient presented to the hospital today stating that he does not feel good and states that he just wants to die.  Patient made very superficial cuts on his wrists in a suicide attempt. Patient states that he has made two previous suicide attempts in the past.  Today, when he was brought to the ED, patient had been drinking and had a BAL of 235.  It is likely that he became more depressed when he was drinking and made an impulsive decision.  Patient denies any HI/Psychosis.  Patient was in the ED on 3/24 after being picked up intoxicated yet again and being combative and causing a disturbance.  Patient denies HI/Psychosis.  He states that he has been sleeping relatively well, but states that he does not eat that well.  Patient is oriented and alert.  He was pleasant when answering questions.  His thoughts were organized, logical and coherent.  His speech  was of normal tone and rate and his eye contact was good.  He did not appear to be responding to any internal stimuli.  His judgment, insight and impulse control are chronically impaired due to his use of alcohol.  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 Chronic pain   . Depressed   . Hypertension   . PTSD (post-traumatic stress disorder)   . TBI (traumatic brain injury) [facility])     Past Surgical History:  Procedure Laterality Date  . BACK SURGERY    . HERNIA REPAIR    . KNEE SURGERY    . KNEE SURGERY    . prostectomy N/A     Family History: No family history on file.  Social History:  reports that he has never smoked. He has never used smokeless tobacco. He reports current alcohol use. He reports previous drug use.  Additional Social History:  Alcohol / Drug Use Pain Medications: See MAR Prescriptions: See MAR Over the Counter: See MAR History of alcohol / drug use?: Yes Longest period of sobriety (when/how long): Unable to quantify Negative Consequences of Use: Financial, Personal relationships Substance #1 Name of Substance 1: alcohol 1 - Age of First Use: UTA 1 - Amount (size/oz): states that he drinks a big bottle of wine and vodka daily 1 - Frequency: daily 1 - Duration: UTA 1 - Last Use / Amount:  Today  CIWA: CIWA-Ar BP: 116/90 Pulse Rate: 93 COWS:    Allergies:  Allergies  Allergen Reactions  . Codeine Hives and Rash  . Gabapentin Other (See Comments) and Anaphylaxis    seizures Other reaction(s): Other (See Comments) seizures Anaphylactic Shock   . Lisinopril Swelling    Lip Swelling   . Penicillins Rash    Did it involve swelling of the face/tongue/throat, SOB, or low BP? No Did it involve sudden or severe rash/hives, skin peeling, or any reaction on the inside of your mouth or nose? Yes Did you need to seek medical attention at a  hospital or doctor's office? No When did it last happen? If all above answers are "NO", may proceed with cephalosporin use.     Home Medications: (Not in a hospital admission)   OB/GYN Status:  No LMP for male patient.  General Assessment Data Location of Assessment: [facility] ED TTS Assessment: In system Is this a Tele or Face-to-Face Assessment?: Tele Assessment Is this an Initial Assessment or a Re-assessment for this encounter?: Initial Assessment Patient Accompanied by:: Other(police) Language Other than English: No Living Arrangements: Homeless/Shelter What gender do you identify as?: Male Marital status: Divorced Living Arrangements: Alone Can pt return to current living arrangement?: Yes Admission Status: Involuntary Petitioner: Other(information not available to this Probation officer) Is patient capable of signing voluntary admission?: Yes Referral Source: Self/Family/Friend Insurance type: VA Coverage and [place] Living Arrangements: Alone Legal Guardian: Other:(self) Name of Psychiatrist: [place] Name of Therapist: [place]  Education Status Is patient currently in school?: No Is the patient employed, unemployed or receiving disability?: Receiving disability income  Risk to self with the past 6 months Suicidal Ideation: Yes-Currently Present Has patient been a risk to self within the past 6 months prior to admission? : No Suicidal Intent: No Has patient had any suicidal intent within the past 6 months prior to admission? : No Is patient at risk for suicide?: No Suicidal Plan?: Yes-Currently Present Has patient had any suicidal plan within the past 6 months prior to admission? : No Specify Current Suicidal Plan: cut self superficially Access to Means: Yes Specify Access to Suicidal Means: household sharpes What has been your use of drugs/alcohol within the last 12 months?: daily alcohol use Previous Attempts/Gestures: Yes How many  times?: 2 Other Self Harm Risks: homeless and minimal support Triggers for Past Attempts: None known Intentional Self Injurious Behavior: Cutting Family Suicide History: No Recent stressful life event(s): (homeless and minimal support) Persecutory voices/beliefs?: No Depression: Yes Depression Symptoms: Despondent, Insomnia, Isolating, Loss of interest in usual pleasures, Feeling worthless/self pity Substance abuse history and/or treatment for substance abuse?: Yes Suicide prevention information given to non-admitted patients: Not applicable  Risk to Others within the past 6 months Homicidal Ideation: No Does patient have any lifetime risk of violence toward others beyond the six months prior to admission? : No Thoughts of Harm to Others: No Current Homicidal Intent: No Current Homicidal Plan: No Access to Homicidal Means: No Identified Victim: none History of harm to others?: No Assessment of Violence: None Noted Violent Behavior Description: none Does patient have access to weapons?: No Criminal Charges Pending?: No Does patient have a court date: No Is patient on probation?: No  Psychosis Hallucinations: None noted  Mental Status Report Appearance/Hygiene: Disheveled, Poor hygiene Eye Contact: Good Motor Activity: Freedom of movement Speech: Logical/coherent Level of Consciousness: Alert Mood: Depressed Affect: Flat Anxiety Level: Moderate Thought Processes: Coherent,  Relevant Judgement: Partial Orientation: Person, Place, Time, Situation Obsessive Compulsive Thoughts/Behaviors: None  Cognitive Functioning Concentration: Decreased Memory: Recent Intact, Remote Intact Is patient IDD: No Insight: Fair Impulse Control: Poor Appetite: Fair Have you had any weight changes? : No Change Sleep: No Change Total Hours of Sleep: 7 Vegetative Symptoms: Decreased grooming  ADLScreening [facility] Assessment Services) Patient's cognitive ability adequate to safely complete  daily activities?: Yes Patient able to express need for assistance with ADLs?: Yes Independently performs ADLs?: Yes (appropriate for developmental age)  Prior Inpatient Therapy Prior Inpatient Therapy: Yes Prior Therapy Dates: 2020 Prior Therapy Facilty/Provider(s): [facility] Reason for Treatment: depression  Prior Outpatient Therapy Prior Outpatient Therapy: Yes Prior Therapy Dates: active Prior Therapy Facilty/Provider(s): [facility] Reason for Treatment: depression Does patient have an ACCT team?: No Does patient have Intensive In-House Services?  : No Does patient have [facility] services? : No Does patient have P4CC services?: No  ADL Screening (condition at time of admission) Patient's cognitive ability adequate to safely complete daily activities?: Yes Is the patient deaf or have difficulty hearing?: No Does the patient have difficulty seeing, even when wearing glasses/contacts?: No Does the patient have difficulty concentrating, remembering, or making decisions?: No Patient able to express need for assistance with ADLs?: Yes Does the patient have difficulty dressing or bathing?: No Independently performs ADLs?: Yes (appropriate for developmental age) Does the patient have difficulty walking or climbing stairs?: No Weakness of Legs: None  Home Assistive Devices/Equipment Home Assistive Devices/Equipment: None  Therapy Consults (therapy consults require a physician order) PT Evaluation Needed: No OT Evalulation Needed: No SLP Evaluation Needed: No Abuse/Neglect Assessment (Assessment to be complete while patient is alone) Abuse/Neglect Assessment Can Be Completed: Yes Physical Abuse: Denies Verbal Abuse: Denies Sexual Abuse: Denies Exploitation of patient/patient's resources: Denies Self-Neglect: Denies Values / Beliefs Cultural Requests During Hospitalization: None Spiritual Requests During Hospitalization: None Consults Spiritual Care Consult Needed: No Transition of  Care Team Consult Needed: No Advance Directives (For Healthcare) Does Patient Have a Medical Advance Directive?: No Would patient like information on creating a medical advance directive?: No - Patient declined Nutrition Screen- [facility] Adult/WL/AP Has the patient recently lost weight without trying?: No Has the patient been eating poorly because of a decreased appetite?: No Malnutrition Screening Tool Score: 0        Disposition: Per Shuvon Rankin, NP, patient will need to be observed overnight for safety and stability as well as to sober up and he will be reassessed in the morning. Disposition Initial Assessment Completed for this Encounter: Yes  This service was provided via telemedicine using a 2-way, interactive audio and video technology.  Names of all persons participating in this telemedicine service and their role in this encounter. Name: Jekhi Mcniven Role: Patient  Name: Kasandra Knudsen Aubry Rankin Role: TTS  Name:  Role:   Name:  Role:     Reatha Armour 08/20/2019 6:22 PM

## 2019-08-20 NOTE — ED Notes (Signed)
Patient dressed in our scrubs patient belongings include black shoes,pair of blue jeans,one black underwear,and one red shirt.

## 2019-08-20 NOTE — ED Provider Notes (Signed)
[facility] Emergency Department Provider Note    First MD Initiated Contact with Patient 08/20/19 1136     (approximate)  I have reviewed the triage vital signs and the nursing notes.   HISTORY  Chief Complaint Altered Mental Status and Alcohol Problem  Level V Caveat: agitation  HPI Omar Davis is a 69 y.o. male well-known to this facility presents the ER acutely agitated intoxicated threatening staff. He is with Engineer, structural.  Also has evidence of superficial lacerations to bilateral forearms.  States he wants to die.      Past Medical History:  Diagnosis Date  . Chronic pain   . Depressed   . Hypertension   . PTSD (post-traumatic stress disorder)   . TBI (traumatic brain injury) ([place])    No family history on file. Past Surgical History:  Procedure Laterality Date  . BACK SURGERY    . HERNIA REPAIR    . KNEE SURGERY    . KNEE SURGERY    . prostectomy N/A    Patient Active Problem List   Diagnosis Date Noted  . Iron deficiency anemia 04/08/2019  . Alcohol intoxication with delirium ([place])   . Sepsis ([place]) 02/08/2019  . Essential hypertension 02/05/2019  . Alcohol-induced mood disorder ([place]) 01/24/2019  . Alcohol dependence with intoxication, uncomplicated ([place]) [id number]  . 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 Suicidal ideation 09/25/2015  . Alcohol abuse 09/09/2015  . PTSD (post-traumatic stress disorder) 09/09/2015  . Agitation 09/09/2015  . Depressed   . Chronic pain       Prior to Admission medications   Medication Sig Start Date End Date Taking? Authorizing Provider  amLODipine (NORVASC) 10 MG tablet Take 10 mg by mouth daily.    [provider]  atorvastatin (LIPITOR) 80 MG tablet Take 40 mg by mouth at bedtime.    [provider]  cyclobenzaprine (FLEXERIL) 10 MG tablet Take 10 mg by mouth 3 (three) times daily as needed for muscle spasms.     [provider]  divalproex (DEPAKOTE ER) 500 MG 24 hr tablet Take 2 tablets (1,000 mg total) by mouth 2 (two) times daily. Patient not taking: Reported on 04/05/2019 02/07/19   Clapacs, Madie Reno, MD  DULoxetine (CYMBALTA) 30 MG capsule Take 1 capsule (30 mg total) by mouth daily. Patient taking differently: Take 90 mg by mouth daily.  02/07/19   Clapacs, Madie Reno, MD  hydrOXYzine (ATARAX/VISTARIL) 25 MG tablet Take 25-50 mg by mouth at bedtime.    [provider]  lisinopril (ZESTRIL) 10 MG tablet Take 1 tablet (10 mg total) by mouth daily. Patient not taking: Reported on 04/05/2019 02/07/19   Clapacs, Madie Reno, MD  naproxen (NAPROSYN) 500 MG tablet Take 1 tablet (500 mg total) by mouth 2 (two) times daily as needed for moderate pain. 02/07/19   Clapacs, Madie Reno, MD  pantoprazole (PROTONIX) 40 MG tablet Take 40 mg by mouth 2 (two) times daily.    [provider]  pravastatin (PRAVACHOL) 80 MG tablet Take 1 tablet (80 mg total) by mouth at bedtime. Patient not taking: Reported on 08/08/2019 02/07/19   Clapacs, Madie Reno, MD  thiamine 100 MG tablet Take 1 tablet (100 mg total) by mouth daily. Patient not taking: Reported on 03/03/2019 02/07/19   Clapacs, Madie Reno, MD  traZODone (DESYREL) 100 MG tablet Take 100-200 mg by mouth at bedtime as needed for sleep.    [provider]  triamcinolone cream (KENALOG) 0.1 % Apply 1 application topically 2 (two) times daily. 07/23/19   [provider]    Allergies Codeine, Gabapentin, Lisinopril, and Penicillins    Social History Social History   Tobacco Use  . Smoking status: Never Smoker  . Smokeless tobacco: Never Used  Substance Use Topics  . Alcohol use: Yes  . Drug use: Not Currently    Review of Systems Patient denies headaches, rhinorrhea, blurry vision, numbness, shortness of breath, chest pain, edema, cough, abdominal pain, nausea, vomiting, diarrhea, dysuria, fevers, rashes or hallucinations unless otherwise  stated above in HPI. ____________________________________________   PHYSICAL EXAM:  VITAL SIGNS: Vitals:   08/20/19 1305  BP: 116/90  Pulse: 93  Temp: 98.8 F (37.1 C)  SpO2: 96%    Constitutional: intoxicated appearing, acutely agitated screaming at staff.  Eyes: Conjunctivae are normal.  Head: Atraumatic. Nose: No congestion/rhinnorhea. Mouth/Throat: Mucous membranes are moist.   Neck: No stridor. Painless ROM.  Cardiovascular: Normal rate, regular rhythm. Grossly normal heart sounds.  Good peripheral circulation. Respiratory: Normal respiratory effort.  No retractions. Lungs CTAB. Gastrointestinal: Soft and nontender. No distention. No abdominal bruits. No CVA tenderness. Genitourinary:  Musculoskeletal: No lower extremity tenderness nor edema.  No joint effusions. Neurologic:  Slurred speech and language. No gross focal neurologic deficits are appreciated. No facial droop Skin:  Skin is warm, dry and intact. No rash noted. Psychiatric: acutely agitated ____________________________________________   LABS (all labs ordered are listed, but only abnormal results are displayed)  Results for orders placed or performed during the hospital encounter of 08/20/19 (from the past 24 hour(s))  CBC with Differential/Platelet     Status: Abnormal   Collection Time: 08/20/19 12:00 PM  Result Value Ref Range   WBC 15.5 (H) 4.0 - 10.5 K/uL   RBC 6.71 (H) 4.22 - 5.81 MIL/uL   Hemoglobin 18.4 (H) 13.0 - 17.0 g/dL   HCT 53.7 (H) 39.0 - 52.0 %   MCV 80.0 80.0 - 100.0 fL   MCH 27.4 26.0 - 34.0 pg   MCHC 34.3 30.0 - 36.0 g/dL   RDW 18.2 (H) 11.5 - 15.5 %   Platelets 344 150 - 400 K/uL   nRBC 0.0 0.0 - 0.2 %   Neutrophils Relative % 66 %   Neutro Abs 10.4 (H) 1.7 - 7.7 K/uL   Lymphocytes Relative 19 %   Lymphs Abs 3.0 0.7 - 4.0 K/uL   Monocytes Relative 11 %   Monocytes Absolute 1.6 (H) 0.1 - 1.0 K/uL   Eosinophils Relative 2 %   Eosinophils Absolute 0.3 0.0 - 0.5 K/uL   Basophils  Relative 1 %   Basophils Absolute 0.2 (H) 0.0 - 0.1 K/uL   Immature Granulocytes 1 %   Abs Immature Granulocytes 0.11 (H) 0.00 - 0.07 K/uL  Comprehensive metabolic panel     Status: Abnormal   Collection Time: 08/20/19 12:00 PM  Result Value Ref Range   Sodium 144 135 - 145 mmol/L   Potassium 4.1 3.5 - 5.1 mmol/L   Chloride 109 98 - 111 mmol/L   CO2 17 (L) 22 - 32 mmol/L   Glucose, Bld 122 (H) 70 - 99 mg/dL   BUN 28 (H) 8 - 23 mg/dL   Creatinine, Ser 1.63 (H) 0.61 - 1.24 mg/dL   Calcium 9.9 8.9 - 10.3 mg/dL   Total Protein 8.0 6.5 - 8.1 g/dL   Albumin 4.5 3.5 - 5.0 g/dL   AST 41 15 - 41 U/L   ALT  29 0 - 44 U/L   Alkaline Phosphatase 144 (H) 38 - 126 U/L   Total Bilirubin 1.2 0.3 - 1.2 mg/dL   GFR calc non Af Amer 42 (L) >60 mL/min   GFR calc Af Amer 49 (L) >60 mL/min   Anion gap 18 (H) 5 - 15  Ethanol     Status: Abnormal   Collection Time: 08/20/19 12:00 PM  Result Value Ref Range   Alcohol, Ethyl (B) 235 (H) <10 mg/dL   ____________________________________________  EKG My review and personal interpretation at Time: 12:13   Indication: agiation  Rate: 95  Rhythm: sinus Axis: normal Other: normal intervals, no stemi ____________________________________________ ____________________________________________   PROCEDURES  Procedure(s) performed:  .Critical Care Performed by: Merlyn Lot, MD Authorized by: Merlyn Lot, MD   Critical care provider statement:    Critical care time (minutes):  35   Critical care time was exclusive of:  Separately billable procedures and treating other patients   Critical care was necessary to treat or prevent imminent or life-threatening deterioration of the following conditions:  [place]oxidrome   Critical care was time spent personally by me on the following activities:  Development of treatment plan with patient or surrogate, discussions with consultants, evaluation of patient's response to treatment, examination of patient,  obtaining history from patient or surrogate, ordering and performing treatments and interventions, ordering and review of laboratory studies, ordering and review of radiographic studies, pulse oximetry, re-evaluation of patient's condition and review of old charts      Critical Care performed: yes ____________________________________________   INITIAL IMPRESSION / [place] / ED COURSE  Pertinent labs & imaging results that were available during my care of the patient were reviewed by me and considered in my medical decision making (see chart for details).   DDX: Psychosis, delirium, medication effect, noncompliance, polysubstance abuse, Si, Hi, depression   Jie Ulrick Alberta is a 69 y.o. who presents to the ED with for evaluation of agitation, etoh intoxication and SI.  Patient has psych history as listed above.  Laboratory testing was ordered to evaluation for underlying electrolyte derangement or signs of underlying organic pathology to explain today's presentation.  Based on history and physical and laboratory evaluation, it appears that the patient's presentation is 2/2 underlying psychiatric disorder with complications with etoh abuse and dependence and will require further evaluation and management by inpatient psychiatry.  Patient was  made an IVC due to agiation and violent behavior towards staff.  Due to his escalating behavior is a very uncooperative with staff being violent towards him for his safety and staff members patient was given IM droperidol.  Was observed and placed on monitor.    The patient has been placed in psychiatric observation due to the need to provide a safe environment for the patient while obtaining psychiatric consultation and evaluation, as well as ongoing medical and medication management to treat the patient's condition.  The patient has been placed under full IVC at this time.   The patient was evaluated in Emergency Department today for the  symptoms described in the history of present illness. He/she was evaluated in the context of the global COVID-19 pandemic, which necessitated consideration that the patient might be at risk for infection with the SARS-CoV-2 virus that causes COVID-19. Institutional protocols and algorithms that pertain to the evaluation of patients at risk for COVID-19 are in a state of rapid change based on information released by regulatory bodies including the CDC and federal and state organizations.  These policies and algorithms were followed during the patient's care in the ED.  As part of my medical decision making, I reviewed the following data within the [place] notes reviewed and incorporated, Labs reviewed, notes from prior ED visits and [place] Controlled Substance Database   ____________________________________________   FINAL CLINICAL IMPRESSION(S) / ED DIAGNOSES  Final diagnoses:  Alcohol abuse  Agitation  Suicidal ideations      NEW MEDICATIONS STARTED DURING THIS VISIT:  New Prescriptions   No medications on file     Note:  This document was prepared using Dragon voice recognition software and may include unintentional dictation errors.    Merlyn Lot, MD 08/20/19 1318

## 2019-08-21 NOTE — ED Notes (Signed)
Hourly rounding reveals patient sleeping in room. No complaints, stable, in no acute distress. Q15 minute rounds and monitoring via Rover and Officer to continue.  

## 2019-08-21 NOTE — ED Provider Notes (Signed)
-----------------------------------------   10:30 AM on 08/21/2019 -----------------------------------------  Patient has been seen and evaluated by psychiatry.  They believe the patient is safe for discharge home from a psychiatric standpoint.  We will discharge the patient home, recommended plenty of oral hydration and abstaining from alcohol.   Harvest Dark, MD 08/21/19 1031

## 2019-08-21 NOTE — ED Provider Notes (Signed)
Emergency Medicine Observation Re-evaluation Note  Omar Mcauley is a 69 y.o. male, seen on rounds today.  Pt initially presented to the ED for complaints of Altered Mental Status, Alcohol Problem, and Suicidal Currently, the patient is resting comfortably and awaiting repeat psychiatric evaluation.  Physical Exam  BP (!) 148/83 (BP Location: Left Arm)   Pulse 99   Temp 98.6 F (37 C) (Oral)   Resp 16   Ht 5\' 8"  (1.727 m)   Wt 81.6 kg   SpO2 96%   BMI 27.37 kg/m  Physical Exam  ED Course / MDM  EKG:EKG Interpretation  Date/Time:  Tuesday August 20 2019 12:13:41 EDT Ventricular Rate:  96 PR Interval:  152 QRS Duration: 92 QT Interval:  350 QTC Calculation: 442 R Axis:   -20 Text Interpretation: Normal sinus rhythm Normal ECG When compared with ECG of 12-Aug-2019 14:52, No significant change was found Confirmed by UNCONFIRMED, DOCTOR ([id number]), editor Mel Almond, Tammy [phone]) on 08/20/2019 2:24:29 PM    I have reviewed the labs performed to date as well as medications administered while in observation.  Recent changes in the last 24 hours include patient now clinically sober. Plan  Current plan is for reevaluation by psychiatry today to decide upon further disposition.. Patient is under full IVC at this time.   Harvest Dark, MD 08/21/19 [phone]

## 2019-08-21 NOTE — ED Notes (Signed)
Left patients breakfast tray in his room, he stated he didn't want it

## 2019-08-21 NOTE — ED Notes (Signed)
Patient just came out of bathroom, writer asked if he needed anything and patient said he was ok

## 2019-08-21 NOTE — ED Notes (Signed)
Patient discharged home, patient received discharge papers. Patient got belongings and verbalized he has received all of his belongings. Patient appropriate and cooperative, Denies SI/HI AVH. Vital signs taken. NAD noted. 

## 2019-08-21 NOTE — ED Notes (Signed)
Patient asking for psychiatrist, says he is ready to go home. Calm and appropriate, NAD noted
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
## 2019-08-21 NOTE — ED Notes (Signed)
IVC rescinded by Dr Claris Gower to discharge home, nurse Donneta Romberg and Security informed of IVC being overturned
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
## 2019-08-21 NOTE — Discharge Instructions (Addendum)
As we discussed please drink plenty of water, obtain plenty of rest.  Please abstain from drinking alcohol.  Return to the emergency department for any symptoms personally concerning to yourself.

## 2019-08-21 NOTE — ED Notes (Addendum)
Hourly rounding reveals patient sleeping in room. No complaints, stable, in no acute distress. Q15 minute rounds and monitoring via Rover and Officer to continue.  

## 2019-08-21 NOTE — ED Notes (Signed)
Hourly rounding reveals patient sleeping in room. No complaints, stable, in no acute distress. Q15 minute rounds and monitoring to continue. 

## 2019-09-02 ENCOUNTER — Emergency Department
Admission: EM | Admit: 2019-09-02 | Discharge: 2019-09-03 | Disposition: A | Discharge status: Home / Self Care | Payer: No Typology Code available for payment source | Attending: Student | Admitting: Student

## 2019-09-02 DIAGNOSIS — F339 Major depressive disorder, recurrent, unspecified: Secondary | ICD-10-CM | POA: Diagnosis not present

## 2019-09-02 DIAGNOSIS — I10 Essential (primary) hypertension: Secondary | ICD-10-CM | POA: Insufficient documentation

## 2019-09-02 DIAGNOSIS — R45851 Suicidal ideations: Secondary | ICD-10-CM | POA: Diagnosis not present

## 2019-09-02 DIAGNOSIS — F10220 Alcohol dependence with intoxication, uncomplicated: Secondary | ICD-10-CM | POA: Diagnosis present

## 2019-09-02 DIAGNOSIS — F1020 Alcohol dependence, uncomplicated: Secondary | ICD-10-CM | POA: Diagnosis present

## 2019-09-02 DIAGNOSIS — F10188 Alcohol abuse with other alcohol-induced disorder: Secondary | ICD-10-CM

## 2019-09-02 DIAGNOSIS — Z79899 Other long term (current) drug therapy: Secondary | ICD-10-CM | POA: Insufficient documentation

## 2019-09-02 DIAGNOSIS — F32A Depression, unspecified: Secondary | ICD-10-CM | POA: Diagnosis present

## 2019-09-02 DIAGNOSIS — R451 Restlessness and agitation: Secondary | ICD-10-CM | POA: Diagnosis present

## 2019-09-02 DIAGNOSIS — F1094 Alcohol use, unspecified with alcohol-induced mood disorder: Secondary | ICD-10-CM | POA: Diagnosis present

## 2019-09-02 DIAGNOSIS — F332 Major depressive disorder, recurrent severe without psychotic features: Secondary | ICD-10-CM | POA: Diagnosis present

## 2019-09-02 DIAGNOSIS — F4310 Post-traumatic stress disorder, unspecified: Secondary | ICD-10-CM | POA: Diagnosis present

## 2019-09-02 DIAGNOSIS — F329 Major depressive disorder, single episode, unspecified: Secondary | ICD-10-CM | POA: Diagnosis present

## 2019-09-02 DIAGNOSIS — F10920 Alcohol use, unspecified with intoxication, uncomplicated: Secondary | ICD-10-CM | POA: Diagnosis present

## 2019-09-02 DIAGNOSIS — F1014 Alcohol abuse with alcohol-induced mood disorder: Secondary | ICD-10-CM | POA: Diagnosis not present

## 2019-09-02 DIAGNOSIS — F1010 Alcohol abuse, uncomplicated: Secondary | ICD-10-CM | POA: Diagnosis present

## 2019-09-02 DIAGNOSIS — F10921 Alcohol use, unspecified with intoxication delirium: Secondary | ICD-10-CM | POA: Diagnosis present

## 2019-09-02 LAB — COMPREHENSIVE METABOLIC PANEL
ALT: 24 U/L (ref 0–44)
AST: 26 U/L (ref 15–41)
Albumin: 4.1 g/dL (ref 3.5–5.0)
Alkaline Phosphatase: 120 U/L (ref 38–126)
Anion gap: 18 — ABNORMAL HIGH (ref 5–15)
BUN: 14 mg/dL (ref 8–23)
CO2: 16 mmol/L — ABNORMAL LOW (ref 22–32)
Calcium: 8.9 mg/dL (ref 8.9–10.3)
Chloride: 105 mmol/L (ref 98–111)
Creatinine, Ser: 1.14 mg/dL (ref 0.61–1.24)
GFR calc Af Amer: >60 mL/min (ref >60)
GFR calc non Af Amer: >60 mL/min (ref >60)
Glucose, Bld: 107 mg/dL — ABNORMAL HIGH (ref 70–99)
Potassium: 3.9 mmol/L (ref 3.5–5.1)
Sodium: 139 mmol/L (ref 135–145)
Total Bilirubin: 0.9 mg/dL (ref 0.3–1.2)
Total Protein: 7.5 g/dL (ref 6.5–8.1)

## 2019-09-02 LAB — CBC WITH DIFFERENTIAL/PLATELET
Abs Immature Granulocytes: 0.1 10*3/uL — ABNORMAL HIGH (ref 0.00–0.07)
Basophils Absolute: 0.1 10*3/uL (ref 0.0–0.1)
Basophils Relative: 1 %
Eosinophils Absolute: 0.1 10*3/uL (ref 0.0–0.5)
Eosinophils Relative: 1 %
HCT: 48.5 % (ref 39.0–52.0)
Hemoglobin: 16.5 g/dL (ref 13.0–17.0)
Immature Granulocytes: 1 %
Lymphocytes Relative: 16 %
Lymphs Abs: 1.8 10*3/uL (ref 0.7–4.0)
MCH: 28.3 pg (ref 26.0–34.0)
MCHC: 34 g/dL (ref 30.0–36.0)
MCV: 83 fL (ref 80.0–100.0)
Monocytes Absolute: 1 10*3/uL (ref 0.1–1.0)
Monocytes Relative: 9 %
Neutro Abs: 7.7 10*3/uL (ref 1.7–7.7)
Neutrophils Relative %: 72 %
Platelets: 348 10*3/uL (ref 150–400)
RBC: 5.84 MIL/uL — ABNORMAL HIGH (ref 4.22–5.81)
RDW: 16.8 % — ABNORMAL HIGH (ref 11.5–15.5)
WBC: 10.8 10*3/uL — ABNORMAL HIGH (ref 4.0–10.5)
nRBC: 0 % (ref 0.0–0.2)

## 2019-09-02 LAB — ETHANOL: Alcohol, Ethyl (B): 311 mg/dL (ref <10)

## 2019-09-02 MED ORDER — LORAZEPAM 2 MG PO TABS: 0.0000 mg | ORAL_TABLET | Freq: Two times a day (BID) | ORAL | Status: DC

## 2019-09-02 MED ORDER — THIAMINE HCL 100 MG/ML IJ SOLN
100.0000 mg | Freq: Once | INTRAMUSCULAR | Status: DC
  Filled 2019-09-02: qty 2

## 2019-09-02 MED ORDER — LORAZEPAM 2 MG PO TABS: 0.0000 mg | ORAL_TABLET | Freq: Four times a day (QID) | ORAL | Status: DC

## 2019-09-02 MED ORDER — LORAZEPAM 2 MG/ML IJ SOLN
0.0000 mg | Freq: Four times a day (QID) | INTRAMUSCULAR | Status: DC
  Administered 2019-09-02: 2 mg via INTRAVENOUS
  Administered 2019-09-03: 1 mg via INTRAVENOUS
  Filled 2019-09-02 (×2): qty 1

## 2019-09-02 MED ORDER — SODIUM CHLORIDE 0.9 % IV BOLUS
500.0000 mL | Freq: Once | INTRAVENOUS | Status: AC
  Administered 2019-09-02: 500 mL via INTRAVENOUS

## 2019-09-02 MED ORDER — LORAZEPAM 2 MG/ML IJ SOLN: 0.0000 mg | Freq: Two times a day (BID) | INTRAMUSCULAR | Status: DC

## 2019-09-02 NOTE — ED Notes (Signed)
Pt given meal tray.

## 2019-09-02 NOTE — ED Notes (Signed)
Patient is in bed asleep. NAD noted

## 2019-09-02 NOTE — ED Provider Notes (Signed)
[facility] Emergency Department Provider Note    First MD Initiated Contact with Patient 09/02/19 1625     (approximate)  I have reviewed the triage vital signs and the nursing notes.   HISTORY  Chief Complaint Alcohol Problem  Level V Caveat:  Intoxication, agitation  HPI Omar Davis is a 69 y.o. male extensive past medical history with frequent visits to the ER presents the ER via EMS under IVC by police reportedly drank 7 bottles of wine today became agitated disruptive at the hotel where he is living.  Reportedly fecal matter is strewn around the hotel room.  Patient intermittently screaming unintelligible words.  No report of trauma.    Past Medical History:  Diagnosis Date  . Chronic pain   . Depressed   . Hypertension   . PTSD (post-traumatic stress disorder)   . TBI (traumatic brain injury) ([place])    No family history on file. Past Surgical History:  Procedure Laterality Date  . BACK SURGERY    . HERNIA REPAIR    . KNEE SURGERY    . KNEE SURGERY    . prostectomy N/A    Patient Active Problem List   Diagnosis Date Noted  . Iron deficiency anemia 04/08/2019  . Alcohol intoxication with delirium ([place])   . Sepsis ([place]) 02/08/2019  . Essential hypertension 02/05/2019  . Alcohol-induced mood disorder ([place]) 01/24/2019  . Alcohol dependence with intoxication, uncomplicated ([place]) [id number]  . Major depressive disorder, recurrent severe without psychotic features ([place]) 08/24/2018  . Suicidal ideation 09/25/2015  . Alcohol abuse 09/09/2015  . PTSD (post-traumatic stress disorder) 09/09/2015  . Agitation 09/09/2015  . Depressed   . Chronic pain       Prior to Admission medications   Medication Sig Start Date End Date Taking? Authorizing Provider  amLODipine (NORVASC) 10 MG tablet Take 10 mg by mouth daily.    [provider]  atorvastatin (LIPITOR) 80 MG tablet Take 40 mg by mouth at bedtime.    [provider]  cyclobenzaprine (FLEXERIL) 10 MG tablet Take 10 mg by mouth 3 (three) times daily as needed for muscle spasms.    [provider]  divalproex (DEPAKOTE ER) 500 MG 24 hr tablet Take 2 tablets (1,000 mg total) by mouth 2 (two) times daily. Patient not taking: Reported on 04/05/2019 02/07/19   Clapacs, Madie Reno, MD  DULoxetine (CYMBALTA) 30 MG capsule Take 1 capsule (30 mg total) by mouth daily. Patient taking differently: Take 90 mg by mouth daily.  02/07/19   Clapacs, Madie Reno, MD  hydrOXYzine (ATARAX/VISTARIL) 25 MG tablet Take 25-50 mg by mouth at bedtime.    [provider]  lisinopril (ZESTRIL) 10 MG tablet Take 1 tablet (10 mg total) by mouth daily. Patient not taking: Reported on 04/05/2019 02/07/19   Clapacs, Madie Reno, MD  naproxen (NAPROSYN) 500 MG tablet Take 1 tablet (500 mg total) by mouth 2 (two) times daily as needed for moderate pain. 02/07/19   Clapacs, Madie Reno, MD  pantoprazole (PROTONIX) 40 MG tablet Take 40 mg by mouth 2 (two) times daily.    [provider]  pravastatin (PRAVACHOL) 80 MG tablet Take 1 tablet (80 mg total) by mouth at bedtime. Patient not taking: Reported on 08/08/2019 02/07/19   Clapacs, Madie Reno, MD  thiamine 100 MG tablet Take 1 tablet (100 mg total) by mouth daily. Patient not taking: Reported on 03/03/2019 02/07/19   Clapacs, Madie Reno, MD  traZODone (DESYREL) 100  MG tablet Take 100-200 mg by mouth at bedtime as needed for sleep.    [provider]  triamcinolone cream (KENALOG) 0.1 % Apply 1 application topically 2 (two) times daily. 07/23/19   [provider]    Allergies Codeine, Gabapentin, Lisinopril, and Penicillins    Social History Social History   Tobacco Use  . Smoking status: Never Smoker  . Smokeless tobacco: Never Used  Substance Use Topics  . Alcohol use: Yes  . Drug use: Not Currently    Review of Systems Patient denies headaches, rhinorrhea, blurry vision, numbness, shortness of breath, chest pain,  edema, cough, abdominal pain, nausea, vomiting, diarrhea, dysuria, fevers, rashes or hallucinations unless otherwise stated above in HPI. ____________________________________________   PHYSICAL EXAM:  VITAL SIGNS: Vitals:   09/02/19 1635  BP: 116/83  Pulse: (!) 102  Resp: 18  Temp: 98.4 F (36.9 C)  SpO2: 95%    Constitutional: Alert, intoxicated and acutely agitated. Eyes: Conjunctivae are normal.  Head: Atraumatic. Nose: No congestion/rhinnorhea. Mouth/Throat: Mucous membranes are moist.   Neck: No stridor. Painless ROM.  Cardiovascular: tachycardic rate, regular rhythm. Grossly normal heart sounds.  Good peripheral circulation. Respiratory: Normal respiratory effort.  No retractions. Lungs CTAB. Gastrointestinal: Soft and nontender. No distention. No abdominal bruits. No CVA tenderness. Genitourinary: deferred Musculoskeletal: No lower extremity tenderness nor edema.  No joint effusions. Neurologic:  slurred speech and language. No gross focal neurologic deficits are appreciated. No facial droop Skin:  Skin is warm, dry and intact. No rash noted. Psychiatric: Mood and affect are normal. Speech and behavior are normal.  ____________________________________________   LABS (all labs ordered are listed, but only abnormal results are displayed)  Results for orders placed or performed during the hospital encounter of 09/02/19 (from the past 24 hour(s))  CBC with Differential/Platelet     Status: Abnormal   Collection Time: 09/02/19  4:34 PM  Result Value Ref Range   WBC 10.8 (H) 4.0 - 10.5 K/uL   RBC 5.84 (H) 4.22 - 5.81 MIL/uL   Hemoglobin 16.5 13.0 - 17.0 g/dL   HCT 48.5 39.0 - 52.0 %   MCV 83.0 80.0 - 100.0 fL   MCH 28.3 26.0 - 34.0 pg   MCHC 34.0 30.0 - 36.0 g/dL   RDW 16.8 (H) 11.5 - 15.5 %   Platelets 348 150 - 400 K/uL   nRBC 0.0 0.0 - 0.2 %   Neutrophils Relative % 72 %   Neutro Abs 7.7 1.7 - 7.7 K/uL   Lymphocytes Relative 16 %   Lymphs Abs 1.8 0.7 - 4.0  K/uL   Monocytes Relative 9 %   Monocytes Absolute 1.0 0.1 - 1.0 K/uL   Eosinophils Relative 1 %   Eosinophils Absolute 0.1 0.0 - 0.5 K/uL   Basophils Relative 1 %   Basophils Absolute 0.1 0.0 - 0.1 K/uL   Immature Granulocytes 1 %   Abs Immature Granulocytes 0.10 (H) 0.00 - 0.07 K/uL  Comprehensive metabolic panel     Status: Abnormal   Collection Time: 09/02/19  4:34 PM  Result Value Ref Range   Sodium 139 135 - 145 mmol/L   Potassium 3.9 3.5 - 5.1 mmol/L   Chloride 105 98 - 111 mmol/L   CO2 16 (L) 22 - 32 mmol/L   Glucose, Bld 107 (H) 70 - 99 mg/dL   BUN 14 8 - 23 mg/dL   Creatinine, Ser 1.14 0.61 - 1.24 mg/dL   Calcium 8.9 8.9 - 10.3 mg/dL   Total  Protein 7.5 6.5 - 8.1 g/dL   Albumin 4.1 3.5 - 5.0 g/dL   AST 26 15 - 41 U/L   ALT 24 0 - 44 U/L   Alkaline Phosphatase 120 38 - 126 U/L   Total Bilirubin 0.9 0.3 - 1.2 mg/dL   GFR calc non Af Amer >60 >60 mL/min   GFR calc Af Amer >60 >60 mL/min   Anion gap 18 (H) 5 - 15  Ethanol     Status: Abnormal   Collection Time: 09/02/19  4:34 PM  Result Value Ref Range   Alcohol, Ethyl (B) 311 (HH) <10 mg/dL   ____________________________________________  EKG My review and personal interpretation at Time: 17:11   Indication: ams  Rate: 105  Rhythm: sinus Axis: left Other: normal intervals, no stemi ____________________________________________  RADIOLOGY   ____________________________________________   PROCEDURES  Procedure(s) performed:  Procedures    Critical Care performed: no ____________________________________________   INITIAL IMPRESSION / ASSESSMENT AND PLAN / ED COURSE  Pertinent labs & imaging results that were available during my care of the patient were reviewed by me and considered in my medical decision making (see chart for details).   DDX: Psychosis, delirium, medication effect, noncompliance, polysubstance abuse, Si, Hi, depression   Malaki Rocci Sircy is a 69 y.o. who presents to the ED with  evidence of alcohol intoxication with delirium.  Patient acutely agitated uncooperative with exam.  I do not identify any evidence of traumatic injury.  He has been seen in the ER extensively for similar presentations frequently discharged once sober.  However, I have a high suspicion is patient does not have capacity to function as an independent adult in society.  He is demonstrated extensive alcohol abuse with frequent 911 calls and multiple ER visitations for similar.  He does have known psychiatric illness that is worsened by his persistent alcohol abuse.  Patient arrives under IVC I will continue this.  Will consult psychiatry case management and TTS.  Clinical Course as of Sep 02 1915  Mon Sep 02, 2019  [id number] Patient at his baseline, heavily intoxicated but fortunately is more cooperative with staff at this time.  We will continue to observe.   [PR]    Clinical Course User Index [PR] Merlyn Lot, MD   The patient has been placed in psychiatric observation due to the need to provide a safe environment for the patient while obtaining psychiatric consultation and evaluation, as well as ongoing medical and medication management to treat the patient's condition.  The patient has been placed under full IVC at this time.   The patient was evaluated in Emergency Department today for the symptoms described in the history of present illness. He/she was evaluated in the context of the global COVID-19 pandemic, which necessitated consideration that the patient might be at risk for infection with the SARS-CoV-2 virus that causes COVID-19. Institutional protocols and algorithms that pertain to the evaluation of patients at risk for COVID-19 are in a state of rapid change based on information released by regulatory bodies including the CDC and federal and state organizations. These policies and algorithms were followed during the patient's care in the ED.  As part of my medical decision making, I  reviewed the following data within the [place] notes reviewed and incorporated, Labs reviewed, notes from prior ED visits and [place] Controlled Substance Database   ____________________________________________   FINAL CLINICAL IMPRESSION(S) / ED DIAGNOSES  Final diagnoses:  Alcohol abuse with alcohol-induced mental disorder ([place])  NEW MEDICATIONS STARTED DURING THIS VISIT:  New Prescriptions   No medications on file     Note:  This document was prepared using Dragon voice recognition software and may include unintentional dictation errors.    Merlyn Lot, MD 09/02/19 [phone]

## 2019-09-02 NOTE — ED Notes (Signed)
IVC PENDING  CONSULT ?

## 2019-09-02 NOTE — ED Notes (Signed)
IV placed in left [facility]

## 2019-09-02 NOTE — ED Triage Notes (Signed)
Arrived via EMS due to drinking 7 bottles of wine, having an episode due to his PTSD, depression and feeling suicidal. Patient refused to have vital signs taken with EMS

## 2019-09-03 NOTE — ED Notes (Signed)
Pt declined breakfast tray. Disoriented only to day.

## 2019-09-03 NOTE — ED Notes (Signed)
Pt ambulated to bathroom. Hand hygiene preformed.

## 2019-09-03 NOTE — TOC Transition Note (Signed)
Transition of Care [facility]) - CM/SW Discharge Note   Patient Details  Name: Omar Davis MRN: [id number] Date of Birth: December 16, 1950  [facility] CM/SW Contact:  Ova Freshwater Phone Number: [phone] 09/03/2019, 10:14 AM   Clinical Narrative:     TOC aware of consult. Patient not medically stable at this time for substance abuse counseling. Pending reevaluationt by psych.         Patient Goals and CMS Choice        Discharge Placement                       Discharge Plan and Services                                     Social Determinants of Health (SDOH) Interventions     Readmission Risk Interventions No flowsheet data found.
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
## 2019-09-03 NOTE — ED Provider Notes (Signed)
Emergency Medicine Observation Re-evaluation Note  Recco Sciacca is a 69 y.o. male, seen on rounds today.  Pt initially presented to the ED for complaints of Alcohol Problem Currently, the patient is sleeping.  Physical Exam  BP 116/83 (BP Location: Left Arm)   Pulse (!) 102   Temp 98.4 F (36.9 C) (Oral)   Resp 18   SpO2 95%  Physical Exam  ED Course / MDM  EKG:  Clinical Course as of Sep 02 552  Mon Sep 02, 2019  [id number] Patient at his baseline, heavily intoxicated but fortunately is more cooperative with staff at this time.  We will continue to observe.   [PR]    Clinical Course User Index [PR] Merlyn Lot, MD   I have reviewed the labs performed to date as well as medications administered while in observation.  No events overnight.   Plan  Current plan is for psychiatric disposition. Patient is under full IVC at this time.   Paulette Blanch, MD 09/03/19 [phone]

## 2019-09-03 NOTE — BH Assessment (Signed)
Assessment Note  Omar Davis is a 69 y.o. male who presented to [facility] ER under IVC for treatment. Per triage note, Pt arrived via EMS due to drinking 7 bottles of wine, having an episode due to his PTSD, depression and feeling suicidal. Patient refused to have vital signs taken with EMS. During the TTS assessment today, Pt reported feeling better and stated "I am feeling better now that I am not hungover". Pt confirmed drinking too much last night due to feeling depressed and his increased PTSD symptoms (bad memories/dreams, anxiety and depressed mood). Pt reported feeling depressed after his girlfriend left him and being lonely in the motel room by himself. Pt stated "I drink to forget about things and I know it is not helpful". Pt reported drinking since the age of 16 and the habit increasing over the years.  Pt reported sobriety for three weeks and identified his depression as the trigger for him to start back drinking. Pt reported drinking 3 bottles of wine and 25 ounces of vodka yesterday. Pt expressed having some fleeting SI but no plan. Pt reports being married 3 times and identified having a partner as the best way for him to remain sober.   Pt denies any current SI/HI or psychosis. Pt confirmed a family history of substance abuse and denies participating in active treatment services stating "I don't think I need to talk to anybody, talking makes me more depressed". Pt denies any eating or sleeping problems and confirmed his safety.   Per Alesia Morin, MD Pt will be discharged with the recommendation to follow up with substance abuse resources provided.   Diagnosis: Alcohol Induced Mood Disorder  Past Medical History:  Past Medical History:  Diagnosis Date  . Chronic pain   . Depressed   . Hypertension   . PTSD (post-traumatic stress disorder)   . TBI (traumatic brain injury) [facility])     Past Surgical History:  Procedure Laterality Date  . BACK SURGERY    . HERNIA REPAIR    .  KNEE SURGERY    . KNEE SURGERY    . prostectomy N/A     Family History: No family history on file.  Social History:  reports that he has never smoked. He has never used smokeless tobacco. He reports current alcohol use. He reports previous drug use.  Additional Social History:  Alcohol / Drug Use Pain Medications: see mar Prescriptions: see mar Over the Counter: see mar History of alcohol / drug use?: Yes Substance #1 Name of Substance 1: alcohol 1 - Amount (size/oz): 25 ounces 1 - Frequency: daily for last 3 weeks 1 - Last Use / Amount: 3 bottles of wine & vodka  CIWA: CIWA-Ar BP: (!) 172/107 Pulse Rate: (!) 118 Nausea and Vomiting: no nausea and no vomiting Tactile Disturbances: none Tremor: two Auditory Disturbances: not present Paroxysmal Sweats: no sweat visible Visual Disturbances: not present Anxiety: no anxiety, at ease Headache, Fullness in Head: none present Agitation: normal activity Orientation and Clouding of Sensorium: cannot do serial additions or is uncertain about date CIWA-Ar Total: 3 COWS:    Allergies:  Allergies  Allergen Reactions  . Codeine Hives and Rash  . Gabapentin Other (See Comments) and Anaphylaxis    seizures Other reaction(s): Other (See Comments) seizures Anaphylactic Shock   . Lisinopril Swelling    Lip Swelling   . Penicillins Rash    Did it involve swelling of the face/tongue/throat, SOB, or low BP? No Did it involve sudden or severe  rash/hives, skin peeling, or any reaction on the inside of your mouth or nose? Yes Did you need to seek medical attention at a hospital or doctor's office? No When did it last happen? If all above answers are "NO", may proceed with cephalosporin use.     Home Medications: (Not in a hospital admission)   OB/GYN Status:  No LMP for male patient.  General Assessment Data Location of Assessment: [facility] ED TTS Assessment: In system Is this a Tele or Face-to-Face Assessment?:  Face-to-Face Is this an Initial Assessment or a Re-assessment for this encounter?: Initial Assessment Patient Accompanied by:: N/A Language Other than English: No Living Arrangements: Other (Comment)(Private Home ) What gender do you identify as?: Male Marital status: Single Living Arrangements: Alone Can pt return to current living arrangement?: Yes Admission Status: Involuntary Petitioner: Other Is patient capable of signing voluntary admission?: Yes Referral Source: Other Insurance type: Tour manager Exam ([place]) Medical Exam completed: Yes  Crisis Care Plan Living Arrangements: Alone  Education Status Is patient currently in school?: No Is the patient employed, unemployed or receiving disability?: Unemployed  Risk to self with the past 6 months Suicidal Ideation: No-Not Currently/Within Last 6 Months Has patient been a risk to self within the past 6 months prior to admission? : Yes Suicidal Intent: No-Not Currently/Within Last 6 Months Has patient had any suicidal intent within the past 6 months prior to admission? : Yes Is patient at risk for suicide?: No Suicidal Plan?: No Has patient had any suicidal plan within the past 6 months prior to admission? : No Access to Means: No What has been your use of drugs/alcohol within the last 12 months?: alcohol Previous Attempts/Gestures: Yes How many times?: 2 Triggers for Past Attempts: Other personal contacts(girlfriend leaving him ) Intentional Self Injurious Behavior: None Family Suicide History: Unknown Recent stressful life event(s): Conflict (Comment)(girlfriend) Persecutory voices/beliefs?: No Depression: Yes Depression Symptoms: Guilt(girlfriend leaving ) Substance abuse history and/or treatment for substance abuse?: Yes Suicide prevention information given to non-admitted patients: Yes  Risk to Others within the past 6 months Homicidal Ideation: No Does patient have any  lifetime risk of violence toward others beyond the six months prior to admission? : No Thoughts of Harm to Others: No Current Homicidal Intent: No Current Homicidal Plan: No Access to Homicidal Means: No History of harm to others?: No Assessment of Violence: None Noted Does patient have access to weapons?: No Criminal Charges Pending?: No(None reported ) Does patient have a court date: No(None reported ) Is patient on probation?: Unknown  Psychosis Hallucinations: None noted Delusions: None noted  Mental Status Report Appearance/Hygiene: Unremarkable Eye Contact: Good Motor Activity: Unremarkable Speech: Logical/coherent Level of Consciousness: Alert Mood: Depressed, Anxious, Sad Affect: Anxious, Depressed, Sad Anxiety Level: Minimal Thought Processes: Relevant Judgement: Partial Orientation: Person, Place, Time, Situation Obsessive Compulsive Thoughts/Behaviors: None  Cognitive Functioning Concentration: Good Memory: Recent Intact, Remote Intact Is patient IDD: No Insight: Fair Impulse Control: Good Appetite: Good Have you had any weight changes? : No Change Sleep: No Change Vegetative Symptoms: None  ADLScreening [facility] Assessment Services) Patient able to express need for assistance with ADLs?: Yes Independently performs ADLs?: Yes (appropriate for developmental age)  Prior Inpatient Therapy Prior Inpatient Therapy: Yes  Prior Outpatient Therapy Prior Outpatient Therapy: No Does patient have an ACCT team?: No Does patient have Intensive In-House Services?  : No Does patient have [facility] services? : No Does patient have P4CC services?: No  ADL Screening (condition  at time of admission) Is the patient deaf or have difficulty hearing?: No Does the patient have difficulty seeing, even when wearing glasses/contacts?: No Does the patient have difficulty concentrating, remembering, or making decisions?: No Patient able to express need for assistance with ADLs?:  Yes Does the patient have difficulty dressing or bathing?: No Independently performs ADLs?: Yes (appropriate for developmental age) Does the patient have difficulty walking or climbing stairs?: No Weakness of Legs: None Weakness of Arms/Hands: None  Home Assistive Devices/Equipment Home Assistive Devices/Equipment: None  Therapy Consults (therapy consults require a physician order) PT Evaluation Needed: No OT Evalulation Needed: No SLP Evaluation Needed: No Abuse/Neglect Assessment (Assessment to be complete while patient is alone) Abuse/Neglect Assessment Can Be Completed: Yes Physical Abuse: Denies Verbal Abuse: Denies Sexual Abuse: Denies Exploitation of patient/patient's resources: Denies Self-Neglect: Denies Values / Beliefs Cultural Requests During Hospitalization: None Spiritual Requests During Hospitalization: None Consults Spiritual Care Consult Needed: No Transition of Care Team Consult Needed: No Advance Directives (For Healthcare) Does Patient Have a Medical Advance Directive?: No Would patient like information on creating a medical advance directive?: No - Patient declined          Disposition:  Disposition Initial Assessment Completed for this Encounter: Yes Patient referred to: Outpatient clinic referral  On Site Evaluation by:   Reviewed with Physician:    Shanon Ace 09/03/2019 1:43 PM
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
## 2019-09-03 NOTE — ED Provider Notes (Signed)
3:10 PM Patient has been seen and evaluated by psychiatry team and deemed appropriate for discharge. Patient determined not to be a threat to themselves or others. IVC rescinded by psychiatry team.  Patient is clinically sober - ambulatory with steady gait, clear speech, linear thought process. Patient is otherwise medically clear and stable for discharge.      Lilia Pro., MD 09/03/19 [phone]

## 2019-09-03 NOTE — ED Notes (Signed)
Pt ambulatory to restroom

## 2019-09-06 ENCOUNTER — Other Ambulatory Visit: Payer: Self-pay

## 2019-09-06 ENCOUNTER — Emergency Department
Admission: EM | Admit: 2019-09-06 | Discharge: 2019-09-07 | Disposition: A | Discharge status: Home / Self Care | Payer: No Typology Code available for payment source | Attending: Emergency Medicine | Admitting: Emergency Medicine

## 2019-09-06 DIAGNOSIS — R451 Restlessness and agitation: Secondary | ICD-10-CM | POA: Diagnosis not present

## 2019-09-06 DIAGNOSIS — I10 Essential (primary) hypertension: Secondary | ICD-10-CM | POA: Insufficient documentation

## 2019-09-06 DIAGNOSIS — F10920 Alcohol use, unspecified with intoxication, uncomplicated: Secondary | ICD-10-CM

## 2019-09-06 DIAGNOSIS — F918 Other conduct disorders: Secondary | ICD-10-CM | POA: Insufficient documentation

## 2019-09-06 DIAGNOSIS — Z046 Encounter for general psychiatric examination, requested by authority: Secondary | ICD-10-CM | POA: Diagnosis not present

## 2019-09-06 DIAGNOSIS — Z79899 Other long term (current) drug therapy: Secondary | ICD-10-CM | POA: Diagnosis not present

## 2019-09-06 DIAGNOSIS — F332 Major depressive disorder, recurrent severe without psychotic features: Secondary | ICD-10-CM | POA: Diagnosis not present

## 2019-09-06 DIAGNOSIS — F1014 Alcohol abuse with alcohol-induced mood disorder: Secondary | ICD-10-CM | POA: Insufficient documentation

## 2019-09-06 DIAGNOSIS — Y908 Blood alcohol level of 240 mg/100 ml or more: Secondary | ICD-10-CM | POA: Insufficient documentation

## 2019-09-06 DIAGNOSIS — F1094 Alcohol use, unspecified with alcohol-induced mood disorder: Secondary | ICD-10-CM | POA: Diagnosis present

## 2019-09-06 DIAGNOSIS — R45851 Suicidal ideations: Secondary | ICD-10-CM | POA: Insufficient documentation

## 2019-09-06 DIAGNOSIS — R4182 Altered mental status, unspecified: Secondary | ICD-10-CM | POA: Diagnosis present

## 2019-09-06 LAB — COMPREHENSIVE METABOLIC PANEL
ALT: 29 U/L (ref 0–44)
AST: 34 U/L (ref 15–41)
Albumin: 4.1 g/dL (ref 3.5–5.0)
Alkaline Phosphatase: 116 U/L (ref 38–126)
Anion gap: 17 — ABNORMAL HIGH (ref 5–15)
BUN: 9 mg/dL (ref 8–23)
CO2: 16 mmol/L — ABNORMAL LOW (ref 22–32)
Calcium: 9.1 mg/dL (ref 8.9–10.3)
Chloride: 109 mmol/L (ref 98–111)
Creatinine, Ser: 1.04 mg/dL (ref 0.61–1.24)
GFR calc Af Amer: >60 mL/min (ref >60)
GFR calc non Af Amer: >60 mL/min (ref >60)
Glucose, Bld: 124 mg/dL — ABNORMAL HIGH (ref 70–99)
Potassium: 3.8 mmol/L (ref 3.5–5.1)
Sodium: 142 mmol/L (ref 135–145)
Total Bilirubin: 0.9 mg/dL (ref 0.3–1.2)
Total Protein: 7.5 g/dL (ref 6.5–8.1)

## 2019-09-06 LAB — ACETAMINOPHEN LEVEL: Acetaminophen (Tylenol), Serum: <10 ug/mL — ABNORMAL LOW (ref 10–30)

## 2019-09-06 LAB — ETHANOL: Alcohol, Ethyl (B): 326 mg/dL (ref <10)

## 2019-09-06 LAB — CBC
HCT: 49.2 % (ref 39.0–52.0)
Hemoglobin: 16.4 g/dL (ref 13.0–17.0)
MCH: 28.1 pg (ref 26.0–34.0)
MCHC: 33.3 g/dL (ref 30.0–36.0)
MCV: 84.4 fL (ref 80.0–100.0)
Platelets: 330 10*3/uL (ref 150–400)
RBC: 5.83 MIL/uL — ABNORMAL HIGH (ref 4.22–5.81)
RDW: 16.6 % — ABNORMAL HIGH (ref 11.5–15.5)
WBC: 10.8 10*3/uL — ABNORMAL HIGH (ref 4.0–10.5)
nRBC: 0 % (ref 0.0–0.2)

## 2019-09-06 LAB — SALICYLATE LEVEL: Salicylate Lvl: <7 mg/dL — ABNORMAL LOW (ref 7.0–30.0)

## 2019-09-06 MED ORDER — SODIUM CHLORIDE 0.9 % IV BOLUS
1000.0000 mL | Freq: Once | INTRAVENOUS | Status: AC
  Administered 2019-09-06: 22:00:00 1000 mL via INTRAVENOUS

## 2019-09-06 MED ORDER — HALOPERIDOL LACTATE 5 MG/ML IJ SOLN
5.0000 mg | Freq: Once | INTRAMUSCULAR | Status: AC
  Administered 2019-09-06: 21:00:00 5 mg via INTRAVENOUS
  Filled 2019-09-06: qty 1

## 2019-09-06 MED ORDER — SODIUM CHLORIDE 0.9 % IV BOLUS: 1000.0000 mL | Freq: Once | INTRAVENOUS | Status: DC

## 2019-09-06 MED ORDER — DIPHENHYDRAMINE HCL 50 MG/ML IJ SOLN
25.0000 mg | Freq: Once | INTRAMUSCULAR | Status: AC
  Administered 2019-09-06: 25 mg via INTRAVENOUS
  Filled 2019-09-06: qty 1

## 2019-09-06 MED ORDER — LORAZEPAM 2 MG/ML IJ SOLN
1.0000 mg | Freq: Once | INTRAMUSCULAR | Status: AC
  Administered 2019-09-06: 1 mg via INTRAVENOUS
  Filled 2019-09-06: qty 1
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
## 2019-09-06 NOTE — ED Provider Notes (Signed)
[facility] EMERGENCY DEPARTMENT Provider Note   CSN: [id number] Arrival date & time: 09/06/19  2021     History Chief Complaint  Patient presents with  . Suicidal    Omar Davis is a 69 y.o. male hx of TBI, PTSD, chronic alcoholic here presenting with agitation and aggressive behavior.  Patient apparently was living in a hotel and was shouting and screaming.  He was found with 3 empty wine bottles next to him.  He is not answering questions and just repeatedly says "fuck me, let me die".  Patient has a history of alcohol-induced mood disorder.  The history is provided by the patient and the EMS personnel.       Past Medical History:  Diagnosis Date  . Chronic pain   . Depressed   . Hypertension   . PTSD (post-traumatic stress disorder)   . TBI (traumatic brain injury) [facility])     Patient Active Problem List   Diagnosis Date Noted  . Iron deficiency anemia 04/08/2019  . Alcohol intoxication with delirium ([place])   . Sepsis ([place]) 02/08/2019  . Essential hypertension 02/05/2019  . Alcohol-induced mood disorder ([place]) 01/24/2019  . Alcohol dependence with intoxication, uncomplicated ([place]) [id number]  . 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 Suicidal ideation 09/25/2015  . Alcohol abuse 09/09/2015  . PTSD (post-traumatic stress disorder) 09/09/2015  . Agitation 09/09/2015  . Depressed   . Chronic pain     Past Surgical History:  Procedure Laterality Date  . BACK SURGERY    . HERNIA REPAIR    . KNEE SURGERY    . KNEE SURGERY    . prostectomy N/A        No family history on file.  Social History   Tobacco Use  . Smoking status: Never Smoker  . Smokeless tobacco: Never Used  Substance Use Topics  . Alcohol use: Yes  . Drug use: Not Currently    Home Medications Prior to Admission medications   Medication Sig Start Date End Date Taking? Authorizing Provider  amLODipine  (NORVASC) 10 MG tablet Take 10 mg by mouth daily.    [provider]  atorvastatin (LIPITOR) 80 MG tablet Take 40 mg by mouth at bedtime.    [provider]  cyclobenzaprine (FLEXERIL) 10 MG tablet Take 10 mg by mouth 3 (three) times daily as needed for muscle spasms.    [provider]  divalproex (DEPAKOTE ER) 500 MG 24 hr tablet Take 2 tablets (1,000 mg total) by mouth 2 (two) times daily. Patient not taking: Reported on 04/05/2019 02/07/19   Clapacs, Madie Reno, MD  DULoxetine (CYMBALTA) 30 MG capsule Take 1 capsule (30 mg total) by mouth daily. Patient taking differently: Take 90 mg by mouth daily.  02/07/19   Clapacs, Madie Reno, MD  hydrOXYzine (ATARAX/VISTARIL) 25 MG tablet Take 25-50 mg by mouth at bedtime.    [provider]  lisinopril (ZESTRIL) 10 MG tablet Take 1 tablet (10 mg total) by mouth daily. Patient not taking: Reported on 04/05/2019 02/07/19   Clapacs, Madie Reno, MD  naproxen (NAPROSYN) 500 MG tablet Take 1 tablet (500 mg total) by mouth 2 (two) times daily as needed for moderate pain. 02/07/19   Clapacs, Madie Reno, MD  pantoprazole (PROTONIX) 40 MG tablet Take 40 mg by mouth 2 (two) times daily.    [provider]  pravastatin (PRAVACHOL) 80 MG tablet Take 1 tablet (80 mg total) by mouth at bedtime.  Patient not taking: Reported on 08/08/2019 02/07/19   Clapacs, Madie Reno, MD  thiamine 100 MG tablet Take 1 tablet (100 mg total) by mouth daily. Patient not taking: Reported on 03/03/2019 02/07/19   Clapacs, Madie Reno, MD  traZODone (DESYREL) 100 MG tablet Take 100-200 mg by mouth at bedtime as needed for sleep.    [provider]  triamcinolone cream (KENALOG) 0.1 % Apply 1 application topically 2 (two) times daily. 07/23/19   [provider]    Allergies    Codeine, Gabapentin, Lisinopril, and Penicillins  Review of Systems   Review of Systems  Psychiatric/Behavioral: Positive for agitation and behavioral problems.  All other systems  reviewed and are negative.   Physical Exam Updated Vital Signs BP 109/80   Pulse 95   Temp 98.6 F (37 C)   Resp 15   SpO2 93%   Physical Exam Vitals and nursing note reviewed.  Constitutional:      Comments: Intoxicated, yelling, screaming   HENT:     Head:     Comments: Old abrasions on the scalp, no obvious laceration or scalp hematoma  Eyes:     Extraocular Movements: Extraocular movements intact.     Pupils: Pupils are equal, round, and reactive to light.  Cardiovascular:     Rate and Rhythm: Regular rhythm. Tachycardia present.     Pulses: Normal pulses.  Pulmonary:     Effort: Pulmonary effort is normal.  Abdominal:     General: Abdomen is flat.     Palpations: Abdomen is soft.  Musculoskeletal:        General: Normal range of motion.     Cervical back: Normal range of motion.  Skin:    General: Skin is warm.     Capillary Refill: Capillary refill takes less than 2 seconds.  Neurological:     General: No focal deficit present.     Comments: Confused, agitated, moving all extremities, not following commands   Psychiatric:     Comments: Agitated, poor judgment      ED Results / Procedures / Treatments   Labs (all labs ordered are listed, but only abnormal results are displayed) Labs Reviewed  COMPREHENSIVE METABOLIC PANEL - Abnormal; Notable for the following components:      Result Value   CO2 16 (*)    Glucose, Bld 124 (*)    Anion gap 17 (*)    All other components within normal limits  ETHANOL - Abnormal; Notable for the following components:   Alcohol, Ethyl (B) 326 (*)    All other components within normal limits  SALICYLATE LEVEL - Abnormal; Notable for the following components:   Salicylate Lvl Q000111Q (*)    All other components within normal limits  ACETAMINOPHEN LEVEL - Abnormal; Notable for the following components:   Acetaminophen (Tylenol), Serum <10 (*)    All other components within normal limits  CBC - Abnormal; Notable for the  following components:   WBC 10.8 (*)    RBC 5.83 (*)    RDW 16.6 (*)    All other components within normal limits  URINE DRUG SCREEN, QUALITATIVE [facility] ONLY)    EKG EKG Interpretation  Date/Time:  Friday September 06 2019 21:03:28 EDT Ventricular Rate:  110 PR Interval:    QRS Duration: 91 QT Interval:  322 QTC Calculation: 436 R Axis:   -17 Text Interpretation: Sinus tachycardia Ventricular premature complex Borderline left axis deviation Abnormal R-wave progression, early transition Nonspecific T abnrm, anterolateral leads No  significant change since last tracing Confirmed by Wandra Arthurs [phone]) on 09/06/2019 9:09:36 PM   Radiology No results found.  Procedures Procedures (including critical care time)  Medications Ordered in ED Medications  sodium chloride 0.9 % bolus 1,000 mL (has no administration in time range)  LORazepam (ATIVAN) injection 1 mg (1 mg Intravenous Given 09/06/19 2120)  haloperidol lactate (HALDOL) injection 5 mg (5 mg Intravenous Given 09/06/19 2120)  diphenhydrAMINE (BENADRYL) injection 25 mg (25 mg Intravenous Given 09/06/19 2121)  sodium chloride 0.9 % bolus 1,000 mL (1,000 mLs Intravenous New Bag/Given 09/06/19 2225)    ED Course  I have reviewed the triage vital signs and the nursing notes.  Pertinent labs & imaging results that were available during my care of the patient were reviewed by me and considered in my medical decision making (see chart for details).    MDM Rules/Calculators/A&P                      Omar Davis is a 69 y.o. male presenting with agitation, alcohol intoxication. Patient is chronic alcoholic and has known substance-induced mood disorder.  Patient appears intoxicated currently.  Will get labs and alcohol level and consult TTS.  11:25 PM ETOH is 326.  Chemistry showed bicarb of 16 and anion gap of 17.  This is likely from his alcohol intoxication .  Patient is a known alcoholic.  Hydrated in the ED and now no longer  tachycardic. Signed out to Dr. Beather Arbour to follow up TTS and psych consult   The patient has been placed in psychiatric observation due to the need to provide a safe environment for the patient while obtaining psychiatric consultation and evaluation, as well as ongoing medical and medication management to treat the patient's condition.  The patient has been placed under full IVC at this time.  Final Clinical Impression(s) / ED Diagnoses Final diagnoses:  None    Rx / DC Orders ED Discharge Orders    None       Drenda Freeze, MD 09/06/19 2326

## 2019-09-06 NOTE — ED Notes (Addendum)
Per police pt had 3 empty wine bottles in hotel and pt was yelling. Pt refuses to answer questions, is yelling "fuck me! I want to die!"

## 2019-09-06 NOTE — ED Notes (Signed)
Pt belongings were given to this EDT by BP, they include: wallet, room key, glasses, $1 loose, receipts.

## 2019-09-06 NOTE — BH Assessment (Signed)
Patient unable to assess at this time due to sedative state after being medicated for aggressive behavior. Will attempt to reassess later.

## 2019-09-07 LAB — URINE DRUG SCREEN, QUALITATIVE (ARMC ONLY)
Amphetamines, Ur Screen: NOT DETECTED
Barbiturates, Ur Screen: NOT DETECTED
Benzodiazepine, Ur Scrn: NOT DETECTED
Cannabinoid 50 Ng, Ur ~~LOC~~: NOT DETECTED
Cocaine Metabolite,Ur ~~LOC~~: NOT DETECTED
MDMA (Ecstasy)Ur Screen: NOT DETECTED
Methadone Scn, Ur: NOT DETECTED
Opiate, Ur Screen: NOT DETECTED
Phencyclidine (PCP) Ur S: NOT DETECTED
Tricyclic, Ur Screen: POSITIVE — AB

## 2019-09-07 NOTE — Discharge Instructions (Addendum)
You have been seen in the emergency department for a  psychiatric concern. You have been evaluated both medically as well as psychiatrically. Please follow-up with your outpatient resources provided. Return to the emergency department for any worsening symptoms, or any thoughts of hurting yourself or anyone else so that we may attempt to help you. 

## 2019-09-07 NOTE — ED Notes (Signed)
Pt provided breakfast tray and milk to drink. Pt sitting up on chair in room.

## 2019-09-07 NOTE — Consult Note (Signed)
[facility] Psychiatry Consult   Reason for Consult:  Alcohol intoxication with Suicidal ideations Referring Physician:  EDP Patient Identification: Omar Davis MRN:  [id number] Principal Diagnosis: Alcohol-induced mood disorder ([place]) Diagnosis:  Principal Problem:   Alcohol-induced mood disorder ([place])   Total Time spent with patient: 30 minutes  Subjective:   Omar Davis is a 69 y.o. male who presented to the hospital with a BAL of 326. He states I drank too much. Today he reports feeling better and also shows some insight into his alcohol use disorder. " I think it is best that I do not drink anymore alcohol. THis is happening too many times. He denies any suicidal ideations, homicidal ideations, and or hallucinations. Patient states he is ready to go home.   HPI:  Per EDP: male hx of TBI, PTSD, chronic alcoholic here presenting with agitation and aggressive behavior.  Patient apparently was living in a hotel and was shouting and screaming.  He was found with 3 empty wine bottles next to him.  He is not answering questions and just repeatedly says "fuck me, let me die".  Patient has a history of alcohol-induced mood disorder.  Patient is seen by this provider via face-to-face.  Patient presents in his room and is awake and sitting up in a chair.  Patient is pleasant, calm, cooperative.  Patient has a long history of alcohol induced mood disorder and has moments of sobriety. Today the patient has cleared from his intoxication and denies any suicidal homicidal ideations and denies any hallucinations.  At this time the patient does not meet any inpatient psychiatric treatment criteria and is psychiatric cleared.  I have rescinded the patient's IVC.    Past Psychiatric History: Depression, alcohol induced mood disorder, chronic alcohol abuse, numerous ED visits  Risk to Self:   Risk to Others:   Prior Inpatient Therapy:   Prior Outpatient Therapy:    Past Medical History:   Past Medical History:  Diagnosis Date  . Chronic pain   . Depressed   . Hypertension   . PTSD (post-traumatic stress disorder)   . TBI (traumatic brain injury) [facility])     Past Surgical History:  Procedure Laterality Date  . BACK SURGERY    . HERNIA REPAIR    . KNEE SURGERY    . KNEE SURGERY    . prostectomy N/A    Family History: No family history on file. Family Psychiatric  History: None reported Social History:  Social History   Substance and Sexual Activity  Alcohol Use Yes     Social History   Substance and Sexual Activity  Drug Use Not Currently    Social History   Socioeconomic History  . Marital status: Legally Separated    Spouse name: Not on file  . Number of children: Not on file  . Years of education: Not on file  . Highest education level: Not on file  Occupational History  . Not on file  Tobacco Use  . Smoking status: Never Smoker  . Smokeless tobacco: Never Used  Substance and Sexual Activity  . Alcohol use: Yes  . Drug use: Not Currently  . Sexual activity: Not Currently  Other Topics Concern  . Not on file  Social History Narrative  . Not on file   Social Determinants of Health   Financial Resource Strain:   . Difficulty of Paying Living Expenses:   Food Insecurity:   . Worried About Charity fundraiser in the Last Year:   .  Ran Out of Food in the Last Year:   Transportation Needs:   . Film/video editor (Medical):   Marland Kitchen Lack of Transportation (Non-Medical):   Physical Activity:   . Days of Exercise per Week:   . Minutes of Exercise per Session:   Stress:   . Feeling of Stress :   Social Connections:   . Frequency of Communication with Friends and Family:   . Frequency of Social Gatherings with Friends and Family:   . Attends Religious Services:   . Active Member of Clubs or Organizations:   . Attends Archivist Meetings:   Marland Kitchen Marital Status:    Additional Social History:    Allergies:   Allergies  Allergen  Reactions  . Codeine Hives and Rash  . Gabapentin Other (See Comments) and Anaphylaxis    seizures Other reaction(s): Other (See Comments) seizures Anaphylactic Shock   . Lisinopril Swelling    Lip Swelling   . Penicillins Rash    Did it involve swelling of the face/tongue/throat, SOB, or low BP? No Did it involve sudden or severe rash/hives, skin peeling, or any reaction on the inside of your mouth or nose? Yes Did you need to seek medical attention at a hospital or doctor's office? No When did it last happen? If all above answers are "NO", may proceed with cephalosporin use.     Labs:  Results for orders placed or performed during the hospital encounter of 09/06/19 (from the past 48 hour(s))  Comprehensive metabolic panel     Status: Abnormal   Collection Time: 09/06/19  8:50 PM  Result Value Ref Range   Sodium 142 135 - 145 mmol/L   Potassium 3.8 3.5 - 5.1 mmol/L   Chloride 109 98 - 111 mmol/L   CO2 16 (L) 22 - 32 mmol/L   Glucose, Bld 124 (H) 70 - 99 mg/dL    Comment: Glucose reference range applies only to samples taken after fasting for at least 8 hours.   BUN 9 8 - 23 mg/dL   Creatinine, Ser 1.04 0.61 - 1.24 mg/dL   Calcium 9.1 8.9 - 10.3 mg/dL   Total Protein 7.5 6.5 - 8.1 g/dL   Albumin 4.1 3.5 - 5.0 g/dL   AST 34 15 - 41 U/L   ALT 29 0 - 44 U/L   Alkaline Phosphatase 116 38 - 126 U/L   Total Bilirubin 0.9 0.3 - 1.2 mg/dL   GFR calc non Af Amer >60 >60 mL/min   GFR calc Af Amer >60 >60 mL/min   Anion gap 17 (H) 5 - 15    Comment: Performed at [facility], [address] [place], [place] [address]  Ethanol     Status: Abnormal   Collection Time: 09/06/19  8:50 PM  Result Value Ref Range   Alcohol, Ethyl (B) 326 (HH) <10 mg/dL    Comment: CRITICAL RESULT CALLED TO, READ BACK BY AND VERIFIED WITH LEXIE OLIVER 09/06/19 @ 2127  [place] (NOTE) Lowest detectable limit for serum alcohol is 10 mg/dL. For medical purposes only. Performed at [facility], [place], [place] [id number]   Salicylate level     Status: Abnormal   Collection Time: 09/06/19  8:50 PM  Result Value Ref Range   Salicylate Lvl Q000111Q (L) 7.0 - 30.0 mg/dL    Comment: Performed at [facility], [address] [place], [place] [address]  Acetaminophen level     Status: Abnormal   Collection  Time: 09/06/19  8:50 PM  Result Value Ref Range   Acetaminophen (Tylenol), Serum <10 (L) 10 - 30 ug/mL    Comment: (NOTE) Therapeutic concentrations vary significantly. A range of 10-30 ug/mL  may be an effective concentration for many patients. However, some  are best treated at concentrations outside of this range. Acetaminophen concentrations >150 ug/mL at 4 hours after ingestion  and >50 ug/mL at 12 hours after ingestion are often associated with  toxic reactions. Performed at [facility], [address] [place], [place] [address]   cbc     Status: Abnormal   Collection Time: 09/06/19  8:50 PM  Result Value Ref Range   WBC 10.8 (H) 4.0 - 10.5 K/uL   RBC 5.83 (H) 4.22 - 5.81 MIL/uL   Hemoglobin 16.4 13.0 - 17.0 g/dL   HCT 49.2 39.0 - 52.0 %   MCV 84.4 80.0 - 100.0 fL   MCH 28.1 26.0 - 34.0 pg   MCHC 33.3 30.0 - 36.0 g/dL   RDW 16.6 (H) 11.5 - 15.5 %   Platelets 330 150 - 400 K/uL   nRBC 0.0 0.0 - 0.2 %    Comment: Performed at [facility], [address] [place], [place] [address]  Urine Drug Screen, Qualitative     Status: Abnormal   Collection Time: 09/07/19  5:53 AM  Result Value Ref Range   Tricyclic, Ur Screen POSITIVE (A) NONE DETECTED   Amphetamines, Ur Screen NONE DETECTED NONE DETECTED   MDMA (Ecstasy)Ur Screen NONE DETECTED NONE DETECTED   Cocaine Metabolite,Ur [place] NONE DETECTED NONE DETECTED   Opiate, Ur Screen NONE DETECTED NONE DETECTED   Phencyclidine (PCP) Ur S NONE DETECTED NONE DETECTED   Cannabinoid 50 Ng, Ur [place] NONE DETECTED NONE DETECTED   Barbiturates, Ur Screen NONE DETECTED NONE  DETECTED   Benzodiazepine, Ur Scrn NONE DETECTED NONE DETECTED   Methadone Scn, Ur NONE DETECTED NONE DETECTED    Comment: (NOTE) Tricyclics + metabolites, urine    Cutoff 1000 ng/mL Amphetamines + metabolites, urine  Cutoff 1000 ng/mL MDMA (Ecstasy), urine              Cutoff 500 ng/mL Cocaine Metabolite, urine          Cutoff 300 ng/mL Opiate + metabolites, urine        Cutoff 300 ng/mL Phencyclidine (PCP), urine         Cutoff 25 ng/mL Cannabinoid, urine                 Cutoff 50 ng/mL Barbiturates + metabolites, urine  Cutoff 200 ng/mL Benzodiazepine, urine              Cutoff 200 ng/mL Methadone, urine                   Cutoff 300 ng/mL The urine drug screen provides only a preliminary, unconfirmed analytical test result and should not be used for non-medical purposes. Clinical consideration and professional judgment should be applied to any positive drug screen result due to possible interfering substances. A more specific alternate chemical method must be used in order to obtain a confirmed analytical result. Gas chromatography / mass spectrometry (GC/MS) is the preferred confirmat ory method. Performed at [facility], [address] [place], [place] [address]     No current facility-administered medications for this encounter.   Current Outpatient Medications  Medication Sig Dispense Refill  . amLODipine (NORVASC) 10 MG tablet Take 10 mg by mouth  daily.    . atorvastatin (LIPITOR) 80 MG tablet Take 40 mg by mouth at bedtime.    . cyclobenzaprine (FLEXERIL) 10 MG tablet Take 10 mg by mouth 3 (three) times daily as needed for muscle spasms.    . divalproex (DEPAKOTE ER) 500 MG 24 hr tablet Take 2 tablets (1,000 mg total) by mouth 2 (two) times daily. (Patient not taking: Reported on 04/05/2019) 120 tablet 0  . DULoxetine (CYMBALTA) 30 MG capsule Take 1 capsule (30 mg total) by mouth daily. (Patient taking differently: Take 90 mg by mouth daily. ) 30 capsule 0  .  hydrOXYzine (ATARAX/VISTARIL) 25 MG tablet Take 25-50 mg by mouth at bedtime.    Marland Kitchen lisinopril (ZESTRIL) 10 MG tablet Take 1 tablet (10 mg total) by mouth daily. (Patient not taking: Reported on 04/05/2019) 30 tablet 0  . naproxen (NAPROSYN) 500 MG tablet Take 1 tablet (500 mg total) by mouth 2 (two) times daily as needed for moderate pain. 60 tablet 0  . pantoprazole (PROTONIX) 40 MG tablet Take 40 mg by mouth 2 (two) times daily.    . pravastatin (PRAVACHOL) 80 MG tablet Take 1 tablet (80 mg total) by mouth at bedtime. (Patient not taking: Reported on 08/08/2019) 30 tablet 0  . thiamine 100 MG tablet Take 1 tablet (100 mg total) by mouth daily. (Patient not taking: Reported on 03/03/2019) 30 tablet 0  . traZODone (DESYREL) 100 MG tablet Take 100-200 mg by mouth at bedtime as needed for sleep.    Marland Kitchen triamcinolone cream (KENALOG) 0.1 % Apply 1 application topically 2 (two) times daily.      Musculoskeletal: Strength & Muscle Tone: within normal limits Gait & Station: normal Patient leans: N/A  Psychiatric Specialty Exam: Physical Exam  Nursing note and vitals reviewed. Constitutional: He is oriented to person, place, and time. He appears well-developed and well-nourished.  Respiratory: Effort normal.  Musculoskeletal:        General: Normal range of motion.  Neurological: He is alert and oriented to person, place, and time.  Skin: Skin is warm.    Review of Systems  Constitutional: Negative.   HENT: Negative.   Eyes: Negative.   Respiratory: Negative.   Cardiovascular: Negative.   Gastrointestinal: Negative.   Genitourinary: Negative.   Musculoskeletal: Negative.   Skin: Negative.   Neurological: Negative.   Psychiatric/Behavioral: Negative.     Blood pressure 109/80, pulse 95, temperature 98.6 F (37 C), resp. rate 15, SpO2 93 %.There is no height or weight on file to calculate BMI.  General Appearance: Disheveled  Eye Contact:  Good  Speech:  Clear and Coherent and Normal  Rate  Volume:  Normal  Mood:  Euthymic  Affect:  Congruent  Thought Process:  Coherent and Descriptions of Associations: Intact  Orientation:  Full (Time, Place, and Person)  Thought Content:  WDL  Suicidal Thoughts:  No  Homicidal Thoughts:  No  Memory:  Immediate;   Fair Recent;   Fair Remote;   Fair  Judgement:  Fair  Insight:  Fair  Psychomotor Activity:  Normal  Concentration:  Concentration: Fair  Recall:  [employer] of Knowledge:  Fair  Language:  Fair  Akathisia:  No  Handed:  Right  AIMS (if indicated):     Assets:  Communication Skills Desire for Improvement Financial Resources/Insurance Housing Resilience  ADL's:  Intact  Cognition:  WNL  Sleep:        Treatment Plan Summary: Follow up with outopatient provider  Recommend cessation  of alcohol use Recommend substance abuse treatment  Disposition: No evidence of imminent risk to self or others at present.   Patient does not meet criteria for psychiatric inpatient admission. Discussed crisis plan, support from social network, calling 911, coming to the Emergency Department, and calling Suicide Hotline.  Suella Broad, FNP 09/07/2019 12:00 PM

## 2019-09-07 NOTE — ED Provider Notes (Signed)
Emergency Medicine Observation Re-evaluation Note  Shrish State is a 69 y.o. male, seen on rounds today.  Pt initially presented to the ED for complaints of Suicidal Currently, the patient is resting in no acute distress.  Physical Exam  BP 109/80   Pulse 95   Temp 98.6 F (37 C)   Resp 15   SpO2 93%  Physical Exam  ED Course / MDM  EKG:EKG Interpretation  Date/Time:  Friday September 06 2019 21:03:28 EDT Ventricular Rate:  110 PR Interval:    QRS Duration: 91 QT Interval:  322 QTC Calculation: 436 R Axis:   -17 Text Interpretation: Sinus tachycardia Ventricular premature complex Borderline left axis deviation Abnormal R-wave progression, early transition Nonspecific T abnrm, anterolateral leads No significant change since last tracing Confirmed by Wandra Arthurs [id number]) on 09/06/2019 9:09:36 PM    I have reviewed the labs performed to date as well as medications administered while in observation.  No events overnight.   Plan  Current plan is for psychiatric disposition. Patient is under full IVC at this time.   Paulette Blanch, MD 09/07/19 [phone]

## 2019-09-07 NOTE — ED Notes (Signed)
Pt awake asking for apple juice. Called RN to make sure that was okay. Pt given a cup of apple juice.

## 2019-09-07 NOTE — ED Notes (Signed)
Patient states he will call a taxi to go home.

## 2019-09-07 NOTE — BH Assessment (Signed)
Assessment Note  Omar Davis is an 69 y.o. male who presents to the ER under IVC due to alcohol intoxication. While intoxicated, he reported SI and wanted law enforcement to shoot him. Patient is well known to the ER for similar presentation. When sober, he denies SI and want to go home.  During the interview, the patient was calm, cooperative and pleasant. He was able to provide appropriate answers to the questions. He denies SI/HI and AV/H. He admits to the use of alcohol and states he is going to stop drinking. He have said the same in the past but this time he states he is going to stop because it's starting to affect his health.  Diagnosis: Alcohol Use Disorder  Past Medical History:  Past Medical History:  Diagnosis Date  . Chronic pain   . Depressed   . Hypertension   . PTSD (post-traumatic stress disorder)   . TBI (traumatic brain injury) [facility])     Past Surgical History:  Procedure Laterality Date  . BACK SURGERY    . HERNIA REPAIR    . KNEE SURGERY    . KNEE SURGERY    . prostectomy N/A     Family History: No family history on file.  Social History:  reports that he has never smoked. He has never used smokeless tobacco. He reports current alcohol use. He reports previous drug use.  Additional Social History:  Alcohol / Drug Use Pain Medications: See PTA Prescriptions: See PTA Over the Counter: See PTA History of alcohol / drug use?: Yes Longest period of sobriety (when/how long): Unable to quantify Substance #1 Name of Substance 1: Alcohol 1 - Frequency: Unable to quantify 1 - Last Use / Amount: 09/07/2019  CIWA: CIWA-Ar BP: (!) 160/93 Pulse Rate: (!) 113 COWS:    Allergies:  Allergies  Allergen Reactions  . Codeine Hives and Rash  . Gabapentin Other (See Comments) and Anaphylaxis    seizures Other reaction(s): Other (See Comments) seizures Anaphylactic Shock   . Lisinopril Swelling    Lip Swelling   . Penicillins Rash    Did it involve  swelling of the face/tongue/throat, SOB, or low BP? No Did it involve sudden or severe rash/hives, skin peeling, or any reaction on the inside of your mouth or nose? Yes Did you need to seek medical attention at a hospital or doctor's office? No When did it last happen? If all above answers are "NO", may proceed with cephalosporin use.     Home Medications: (Not in a hospital admission)   OB/GYN Status:  No LMP for male patient.  General Assessment Data Location of Assessment: [facility] ED TTS Assessment: In system Is this a Tele or Face-to-Face Assessment?: Face-to-Face Is this an Initial Assessment or a Re-assessment for this encounter?: Initial Assessment Patient Accompanied by:: N/A Language Other than English: No Living Arrangements: Homeless/Shelter What gender do you identify as?: Male Marital status: Single Pregnancy Status: No Living Arrangements: Alone Can pt return to current living arrangement?: No Admission Status: Involuntary Petitioner: ED Attending Is patient capable of signing voluntary admission?: No(Under IVC) Referral Source: Self/Family/Friend Insurance type: [place] Screening Exam ([place]) Medical Exam completed: Yes  Crisis Care Plan Living Arrangements: Alone Legal Guardian: Other:(Self) Name of Psychiatrist: [place] Name of Therapist: [place]  Education Status Is patient currently in school?: No Is the patient employed, unemployed or receiving disability?: Unemployed, Receiving disability income  Risk to self with the past 6 months Suicidal Ideation: No  Has patient been a risk to self within the past 6 months prior to admission? : No Suicidal Intent: No Has patient had any suicidal intent within the past 6 months prior to admission? : No Is patient at risk for suicide?: No Suicidal Plan?: No Has patient had any suicidal plan within the past 6 months prior to admission? : No Specify Current Suicidal Plan: Reports of  none Access to Means: No Specify Access to Suicidal Means: Reports of none What has been your use of drugs/alcohol within the last 12 months?: Alcohol Previous Attempts/Gestures: No How many times?: 0 Other Self Harm Risks: Reports of none Triggers for Past Attempts: None known Intentional Self Injurious Behavior: None Family Suicide History: No Recent stressful life event(s): Other (Comment)(Active Alcohol Abuse) Persecutory voices/beliefs?: No Depression: Yes Depression Symptoms: Guilt, Feeling worthless/self pity Substance abuse history and/or treatment for substance abuse?: No Suicide prevention information given to non-admitted patients: Not applicable  Risk to Others within the past 6 months Homicidal Ideation: No Does patient have any lifetime risk of violence toward others beyond the six months prior to admission? : No Thoughts of Harm to Others: No Current Homicidal Intent: No Current Homicidal Plan: No Access to Homicidal Means: No Identified Victim: Reports of none History of harm to others?: No Assessment of Violence: None Noted Violent Behavior Description: Reports of none Does patient have access to weapons?: No Criminal Charges Pending?: No Is patient on probation?: No  Psychosis Hallucinations: None noted Delusions: None noted  Mental Status Report Appearance/Hygiene: Unremarkable, In scrubs Eye Contact: Fair Motor Activity: Freedom of movement, Unremarkable Speech: Logical/coherent, Unremarkable Level of Consciousness: Alert Mood: Anxious, Pleasant Affect: Appropriate to circumstance Anxiety Level: Minimal Thought Processes: Coherent, Relevant Judgement: Unimpaired Orientation: Person, Place, Time, Situation, Appropriate for developmental age Obsessive Compulsive Thoughts/Behaviors: None  Cognitive Functioning Concentration: Normal Memory: Recent Intact, Remote Intact Is patient IDD: No Insight: Fair Impulse Control: Fair Appetite: Good Have  you had any weight changes? : No Change Sleep: No Change Total Hours of Sleep: 8 Vegetative Symptoms: None  ADLScreening [facility] Assessment Services) Patient's cognitive ability adequate to safely complete daily activities?: Yes Patient able to express need for assistance with ADLs?: Yes Independently performs ADLs?: Yes (appropriate for developmental age)  Prior Inpatient Therapy Prior Inpatient Therapy: Yes Prior Therapy Dates: 2020 Prior Therapy Facilty/Provider(s): [facility] Reason for Treatment: depression  Prior Outpatient Therapy Prior Outpatient Therapy: No Prior Therapy Dates: active Prior Therapy Facilty/Provider(s): [facility] Reason for Treatment: depression Does patient have an ACCT team?: No Does patient have Intensive In-House Services?  : No Does patient have [facility] services? : No Does patient have P4CC services?: No  ADL Screening (condition at time of admission) Patient's cognitive ability adequate to safely complete daily activities?: Yes Is the patient deaf or have difficulty hearing?: No Does the patient have difficulty seeing, even when wearing glasses/contacts?: No Does the patient have difficulty concentrating, remembering, or making decisions?: No Patient able to express need for assistance with ADLs?: Yes Does the patient have difficulty dressing or bathing?: No Independently performs ADLs?: Yes (appropriate for developmental age) Does the patient have difficulty walking or climbing stairs?: No Weakness of Legs: None Weakness of Arms/Hands: None  Home Assistive Devices/Equipment Home Assistive Devices/Equipment: None  Therapy Consults (therapy consults require a physician order) PT Evaluation Needed: No OT Evalulation Needed: No SLP Evaluation Needed: No Abuse/Neglect Assessment (Assessment to be complete while patient is alone) Abuse/Neglect Assessment Can Be Completed: Yes Physical Abuse: Denies Verbal Abuse: Denies  Sexual Abuse:  Denies Exploitation of patient/patient's resources: Denies Self-Neglect: Denies Values / Beliefs Cultural Requests During Hospitalization: None Spiritual Requests During Hospitalization: None Consults Spiritual Care Consult Needed: No Transition of Care Team Consult Needed: No Advance Directives (For Healthcare) Does Patient Have a Medical Advance Directive?: No Would patient like information on creating a medical advance directive?: No - Patient declined   Child/Adolescent Assessment Running Away Risk: Denies(Patient is an adult)  Disposition:  Disposition Initial Assessment Completed for this Encounter: Yes  On Site Evaluation by:   Reviewed with Physician:    Gunnar Fusi MS, LCAS, [facility], [place] Therapeutic Triage Specialist 09/07/2019 12:51 PM

## 2019-09-07 NOTE — ED Provider Notes (Signed)
-----------------------------------------   1:30 PM on 09/07/2019 -----------------------------------------  Patient has been seen and evaluated by psychiatry.  They believe the patient safe for discharge home from psychiatric standpoint.  Patient's medical work-up consistent with alcohol intoxication, patient now clinically sober.  No other acute findings.  Patient will be discharged home with outpatient resources.   Harvest Dark, MD 09/07/19 1330

## 2019-09-07 NOTE — ED Notes (Addendum)
Patient has come out to the hallway a couple of times to ask when he is being discharged. Patient has a steady gait.

## 2019-09-07 NOTE — ED Notes (Signed)
Pt ambulatory to toilet with steady gait noted.  

## 2019-11-18 ENCOUNTER — Emergency Department: Payer: No Typology Code available for payment source

## 2019-11-18 ENCOUNTER — Emergency Department
Admission: EM | Admit: 2019-11-18 | Discharge: 2019-11-18 | Disposition: A | Discharge status: Home / Self Care | Payer: No Typology Code available for payment source | Attending: Emergency Medicine | Admitting: Emergency Medicine

## 2019-11-18 ENCOUNTER — Encounter: Payer: Self-pay | Admitting: Emergency Medicine

## 2019-11-18 ENCOUNTER — Other Ambulatory Visit: Payer: Self-pay

## 2019-11-18 DIAGNOSIS — I10 Essential (primary) hypertension: Secondary | ICD-10-CM | POA: Insufficient documentation

## 2019-11-18 DIAGNOSIS — Z79899 Other long term (current) drug therapy: Secondary | ICD-10-CM | POA: Diagnosis not present

## 2019-11-18 DIAGNOSIS — M79661 Pain in right lower leg: Secondary | ICD-10-CM | POA: Insufficient documentation

## 2019-11-18 DIAGNOSIS — M79604 Pain in right leg: Secondary | ICD-10-CM

## 2019-11-18 MED ORDER — TRAMADOL HCL 50 MG PO TABS: 50.0000 mg | ORAL_TABLET | Freq: Two times a day (BID) | ORAL | 0 refills | Status: AC | PRN

## 2019-11-18 MED ORDER — TRAMADOL HCL 50 MG PO TABS
50.0000 mg | ORAL_TABLET | Freq: Once | ORAL | Status: AC
  Administered 2019-11-18: 50 mg via ORAL
  Filled 2019-11-18: qty 1

## 2019-11-18 MED ORDER — IBUPROFEN 600 MG PO TABS: 600.0000 mg | ORAL_TABLET | Freq: Three times a day (TID) | ORAL | 0 refills | Status: DC | PRN

## 2019-11-18 MED ORDER — IBUPROFEN 600 MG PO TABS
600.0000 mg | ORAL_TABLET | Freq: Once | ORAL | Status: AC
  Administered 2019-11-18: 600 mg via ORAL
  Filled 2019-11-18: qty 1

## 2019-11-18 NOTE — ED Notes (Signed)
See triage note  Presents with pain to right leg  States he was moving funriture on Saturday  Twisted his right knee    States he is having "burning" type pain to knee  But pain is going from hip down into lower leg

## 2019-11-18 NOTE — ED Provider Notes (Signed)
[facility] Emergency Department Provider Note   ____________________________________________   First MD Initiated Contact with Patient 11/18/19 [phone]     (approximate)  I have reviewed the triage vital signs and the nursing notes.   HISTORY  Chief Complaint Hip Pain    HPI Omar Davis is a 69 y.o. male complain of left hip pain that radiates down the right leg for 2 days.  Patient states he has to walk with atypical gait secondary to pain.  Onset of pain was after moving furniture.  Patient has a prosthesis of the right knee.         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 Authorizing Provider  amLODipine (NORVASC) 10 MG tablet Take 10 mg by mouth daily.    [provider]  atorvastatin (LIPITOR) 80 MG tablet Take 40 mg by mouth at bedtime.    [provider]  cyclobenzaprine (FLEXERIL) 10 MG tablet Take 10 mg by mouth 3 (three) times daily as needed for muscle spasms.    [provider]  DULoxetine (CYMBALTA) 30 MG capsule Take 1 capsule (30 mg total) by mouth daily. Patient taking differently: Take 90 mg by mouth daily.  02/07/19   Clapacs, Madie Reno, MD  hydrOXYzine (ATARAX/VISTARIL) 25 MG tablet Take 25-50 mg by mouth at bedtime.    [provider]  ibuprofen (ADVIL) 600 MG tablet Take 1 tablet (600 mg total) by mouth every 8 (eight) hours as needed. 11/18/19   Sable Feil, PA-C  naproxen (NAPROSYN) 500 MG tablet Take 1 tablet (500 mg total) by mouth 2 (two) times daily as needed for moderate pain. 02/07/19   Clapacs, Madie Reno, MD  pantoprazole (PROTONIX) 40 MG tablet Take 40 mg by mouth 2 (two) times daily.    [provider]  pravastatin (PRAVACHOL) 80 MG tablet Take 1 tablet (80 mg total) by mouth at bedtime. Patient not taking: Reported on 08/08/2019 02/07/19   Clapacs, Madie Reno, MD  traMADol (ULTRAM) 50 MG tablet Take 1 tablet (50 mg total) by mouth every 12 (twelve) hours as needed. 11/18/19   Sable Feil, PA-C  traZODone (DESYREL) 100 MG tablet Take 100-200 mg by mouth at bedtime as needed for sleep.    [provider]  triamcinolone cream (KENALOG) 0.1 % Apply 1 application topically 2 (two) times daily. 07/23/19   [provider]    Allergies Codeine, Gabapentin, Lisinopril, and Penicillins  No family  history on file.  Social History Social History   Tobacco Use   Smoking status: Never Smoker   Smokeless tobacco: Never Used  Scientific laboratory technician Use: Unknown  Substance Use Topics   Alcohol use: Yes   Drug use: Not Currently    Review of Systems Constitutional: No fever/chills Eyes: No visual changes. ENT: No sore throat. Cardiovascular: Denies chest pain. Respiratory: Denies shortness of breath. Gastrointestinal: No abdominal pain.  No nausea, no vomiting.  No diarrhea.  No constipation. Genitourinary: Negative for dysuria. Musculoskeletal: Right hip and right leg pain. Skin: Negative for  rash. Neurological: Negative for headaches, focal weakness or numbness. Psychiatric:  Alcohol abuse, depression, and PTSD. endocrine:  Hypertension. Allergic/Immunilogical: Codeine, gabapentin, lisinopril, and penicillin. ____________________________________________   PHYSICAL EXAM:  VITAL SIGNS: ED Triage Vitals  Enc Vitals Group     BP 11/18/19 0918 (!) 143/84     Pulse Rate 11/18/19 0918 85     Resp 11/18/19 0918 16     Temp 11/18/19 0918 98.5 F (36.9 C)     Temp Source 11/18/19 0918 Oral     SpO2 11/18/19 0918 98 %     Weight 11/18/19 0919 180 lb (81.6 kg)     Height 11/18/19 0919 5\' 8"  (1.727 m)     Head Circumference --      Peak Flow --      Pain Score 11/18/19 0918 6     Pain Loc --      Pain Edu? --      Excl. in [place]? --     Constitutional: Alert and oriented. Well appearing and in no acute distress. Cardiovascular: Normal rate, regular rhythm. Grossly normal heart sounds.  Good peripheral circulation. Respiratory: Normal respiratory effort.  No retractions. Lungs CTAB. Musculoskeletal: No obvious deformity of the right hip.  No leg length discrepancy.  No obvious deformity of the right lower leg.  Patient is moderate guarding palpation of the greater trochanter and anterior patella.   Neurologic:  Normal speech and language. No gross focal neurologic deficits are appreciated. No gait instability. Skin:  Skin is warm, dry and intact. No rash noted.  No abrasion or ecchymosis. Psychiatric: Mood and affect are normal. Speech and behavior are normal.  ____________________________________________   LABS (all labs ordered are listed, but only abnormal results are displayed)  Labs Reviewed - No data to display ____________________________________________  EKG   ____________________________________________  RADIOLOGY  ED MD interpretation:    Official radiology report(s): DG Tibia/Fibula Right  Result Date: 11/18/2019 CLINICAL DATA:  Right leg pain  secondary to a twisting injury 2 days ago. EXAM: RIGHT TIBIA AND FIBULA - 2 VIEW COMPARISON:  Radiographs dated 10/31/2014 FINDINGS: There is no evidence of fracture or other focal bone lesions. Components of the total knee prosthesis appear in good position with no evidence of loosening. No appreciable joint effusion. Soft tissues are unremarkable. IMPRESSION: No acute abnormalities. Electronically Signed   By: Lorriane Shire M.D.   On: 11/18/2019 10:31   DG Hip Unilat W or Wo Pelvis 2-3 Views Right  Result Date: 11/18/2019 CLINICAL DATA:  Right leg pain secondary to a twisting injury two days ago. EXAM: DG HIP (WITH OR WITHOUT PELVIS) 2-3V RIGHT COMPARISON:  None. FINDINGS: There is no evidence of hip fracture or dislocation. There is no evidence of arthropathy or other focal bone abnormality of the right hip. Previous posterior decompression at L4-5 and L5-S1. IMPRESSION: Negative. Electronically Signed   By: Lorriane Shire  M.D.   On: 11/18/2019 10:29    ____________________________________________   PROCEDURES  Procedure(s) performed (including Critical Care):  Procedures   ____________________________________________   INITIAL IMPRESSION / ASSESSMENT AND PLAN / ED COURSE  As part of my medical decision making, I reviewed the following data within the [place]     Patient presents her right hip and right lower leg pain status post moving furniture 2 days ago.  Discussed no acute findings with x-rays of the hip and the tib-fib.  Patient complains consistent muscle skeletal pain.  Patient given discharge care instruction advised take medication as directed.  Patient advised follow-up PCP.    Sears Oran was evaluated in Emergency Department on 11/18/2019 for the symptoms described in the history of present illness. He was evaluated in the context of the global COVID-19 pandemic, which necessitated consideration that the patient might be at risk for infection  with the SARS-CoV-2 virus that causes COVID-19. Institutional protocols and algorithms that pertain to the evaluation of patients at risk for COVID-19 are in a state of rapid change based on information released by regulatory bodies including the CDC and federal and state organizations. These policies and algorithms were followed during the patient's care in the ED.       ____________________________________________   FINAL CLINICAL IMPRESSION(S) / ED DIAGNOSES  Final diagnoses:  Musculoskeletal pain of lower extremity, right     ED Discharge Orders         Ordered    traMADol (ULTRAM) 50 MG tablet  Every 12 hours PRN     Discontinue  Reprint     11/18/19 1048    ibuprofen (ADVIL) 600 MG tablet  Every 8 hours PRN     Discontinue  Reprint     11/18/19 1048           Note:  This document was prepared using Dragon voice recognition software and may include unintentional dictation errors.    Sable Feil, PA-C 11/18/19 1055    Nena Polio, MD 11/18/19 [phone]

## 2019-11-18 NOTE — ED Triage Notes (Signed)
Patient presents to the ED with right hip pain radiating down his right leg since moving furniture on Saturday.  Patient states he is able to walk with a limp but pain is not improving.

## 2019-11-27 ENCOUNTER — Other Ambulatory Visit: Payer: Self-pay | Admitting: Orthopedic Surgery

## 2019-11-27 ENCOUNTER — Other Ambulatory Visit (HOSPITAL_COMMUNITY): Payer: Self-pay | Admitting: Orthopedic Surgery

## 2019-11-27 DIAGNOSIS — M5416 Radiculopathy, lumbar region: Secondary | ICD-10-CM

## 2019-12-15 ENCOUNTER — Ambulatory Visit
Admission: RE | Admit: 2019-12-15 | Discharge: 2019-12-15 | Disposition: A | Discharge status: Home / Self Care | Payer: Medicare HMO | Source: Ambulatory Visit | Attending: Orthopedic Surgery | Admitting: Orthopedic Surgery

## 2019-12-15 ENCOUNTER — Other Ambulatory Visit: Payer: Self-pay

## 2019-12-15 DIAGNOSIS — M5416 Radiculopathy, lumbar region: Secondary | ICD-10-CM

## 2020-01-16 ENCOUNTER — Other Ambulatory Visit: Payer: Self-pay | Admitting: Neurosurgery

## 2020-01-16 DIAGNOSIS — D3610 Benign neoplasm of peripheral nerves and autonomic nervous system, unspecified: Secondary | ICD-10-CM

## 2020-01-17 ENCOUNTER — Other Ambulatory Visit: Payer: Self-pay | Admitting: Neurosurgery

## 2020-01-17 DIAGNOSIS — D3610 Benign neoplasm of peripheral nerves and autonomic nervous system, unspecified: Secondary | ICD-10-CM

## 2020-01-24 ENCOUNTER — Ambulatory Visit: Admission: RE | Admit: 2020-01-24 | Payer: Medicare HMO | Source: Ambulatory Visit

## 2020-01-26 IMAGING — CT CT MAXILLOFACIAL W/O CM
3 series · 15 of 47 positions shown, 18 images · non-contrast
Comparison: CT head 01/16/2019

CLINICAL DATA: Black right eye.  Abrasions to forehead.

EXAM:
CT HEAD WITHOUT CONTRAST
CT MAXILLOFACIAL WITHOUT CONTRAST
CT CERVICAL SPINE WITHOUT CONTRAST
TECHNIQUE: Multidetector CT imaging of the head, cervical spine, and
maxillofacial structures were performed using the standard protocol
without intravenous contrast. Multiplanar CT image reconstructions
of the cervical spine and maxillofacial structures were also
generated.

[Series 2: max soft · axial · 0.33mm/px · z∈[-166,-38]mm · 9 of 76 slices shown, 12 images]
[im 6/76  brain]
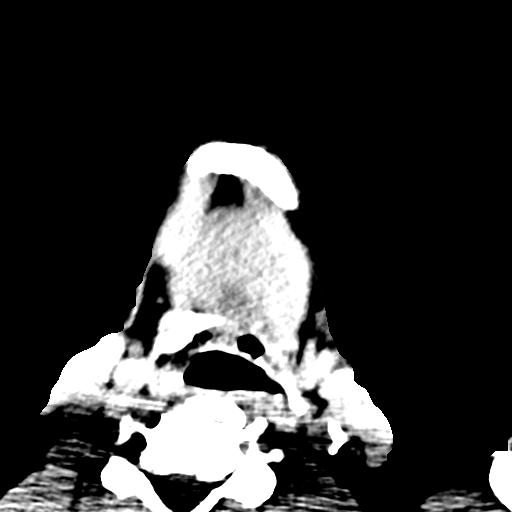
[im 6/76  bone]
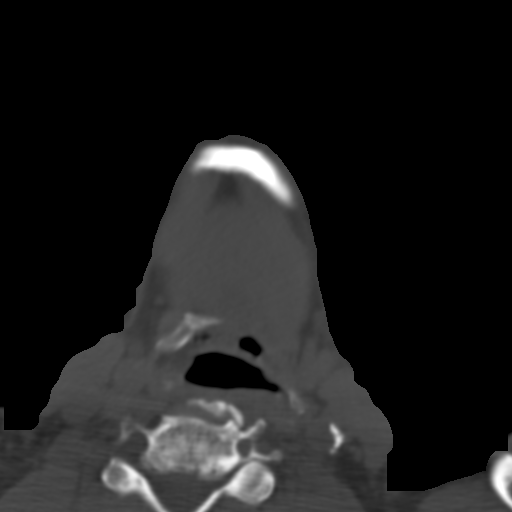
[im 13/76  bone]
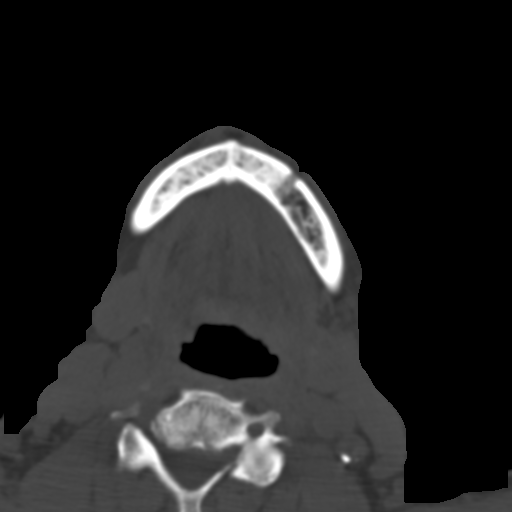
[im 21/76  bone]
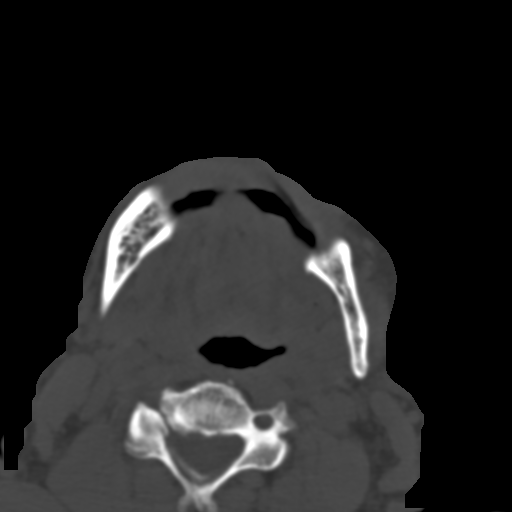
[im 29/76  bone]
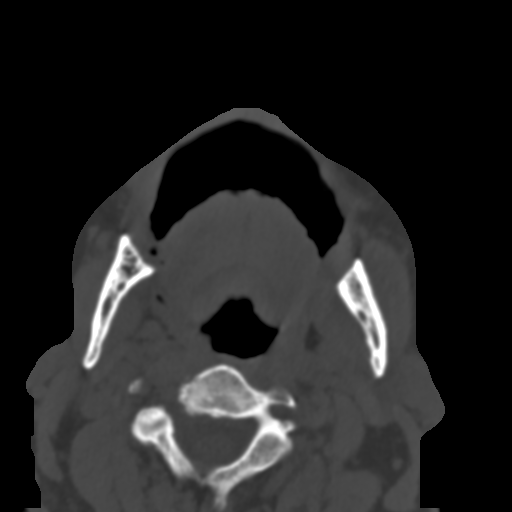
[im 39/76  brain]
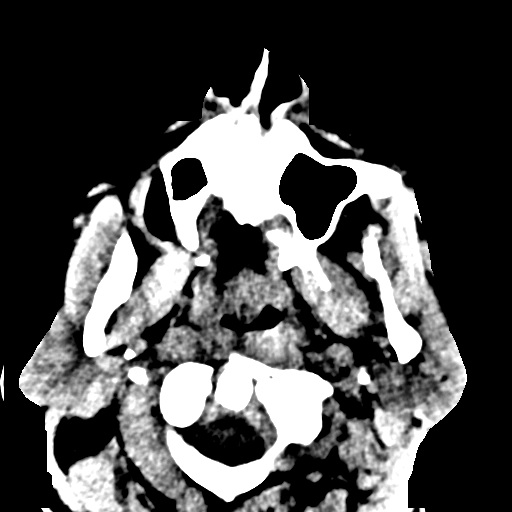
[im 39/76  bone]
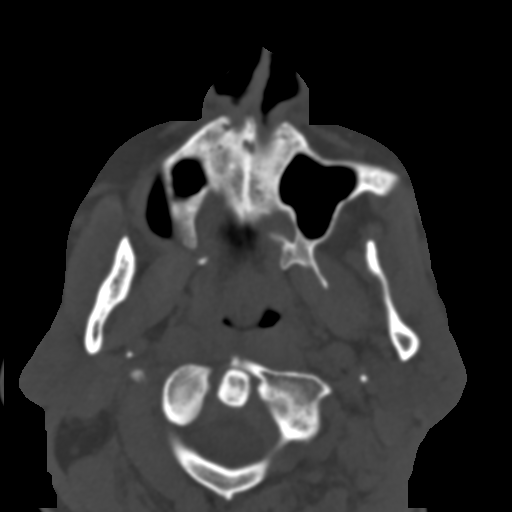
[im 47/76  bone]
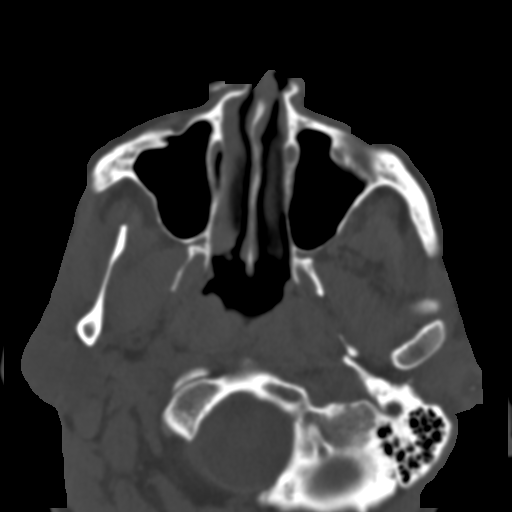
[im 55/76  bone]
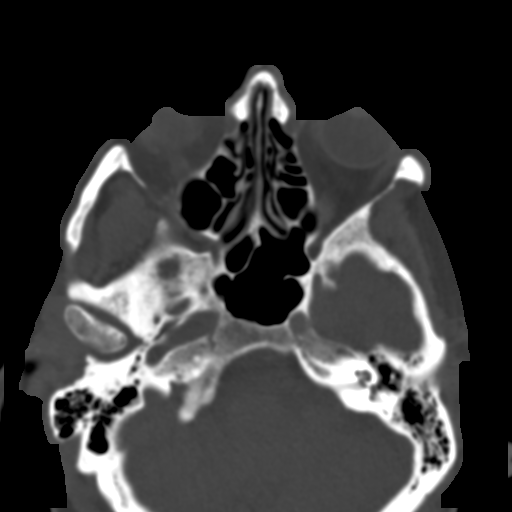
[im 63/76  bone]
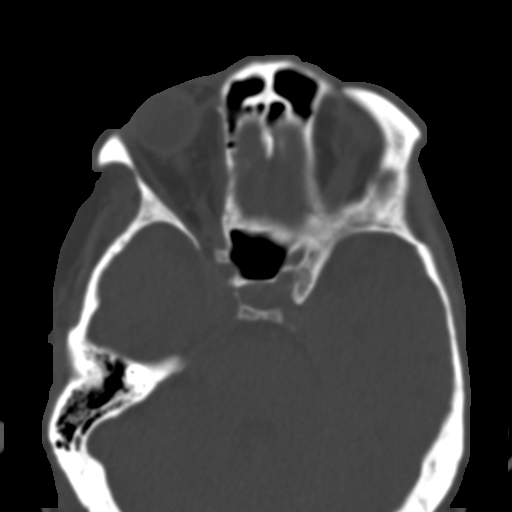
[im 70/76  brain]
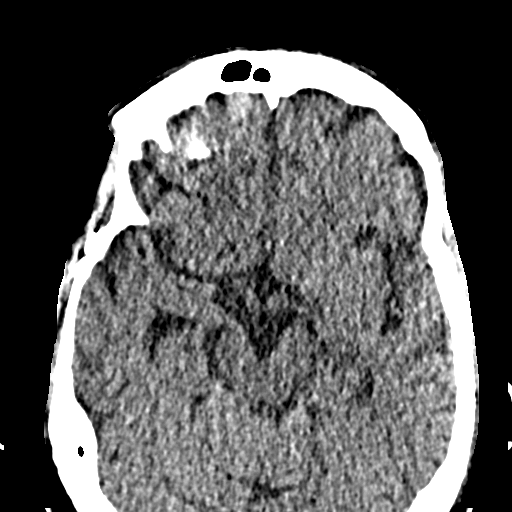
[im 70/76  bone]
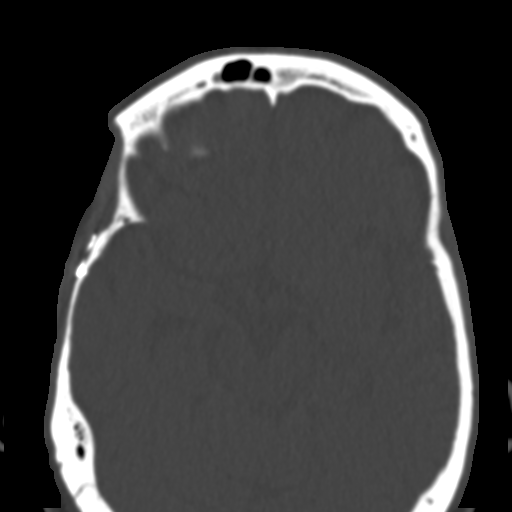

[Series 6: coronal soft · coronal · 0.38mm/px · 3 of 58 slices shown]
[im 20/58  bone]
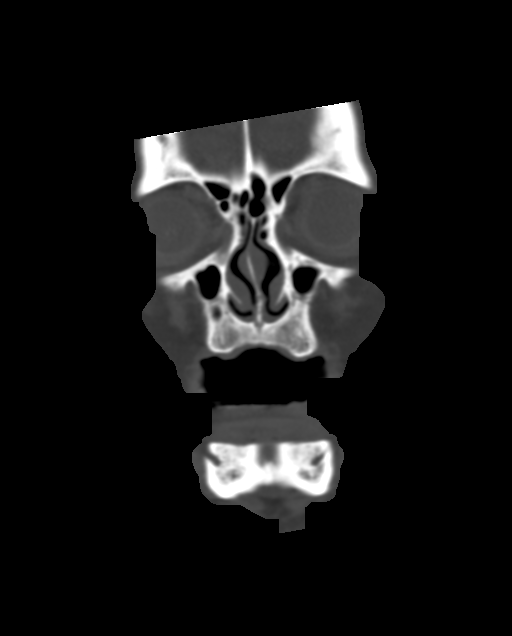
[im 26/58  bone]
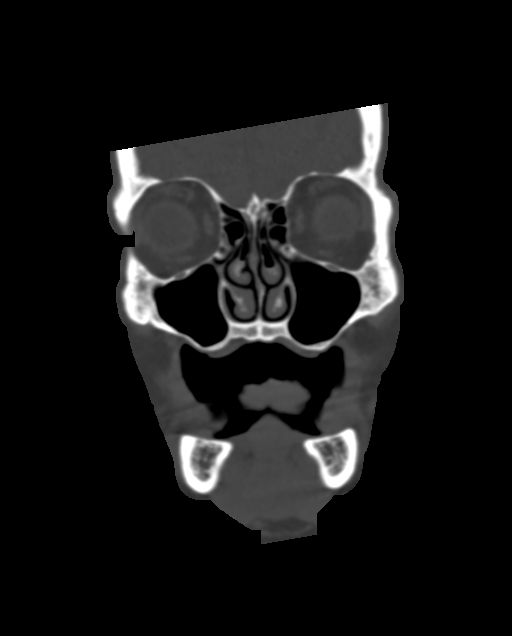
[im 32/58  bone]
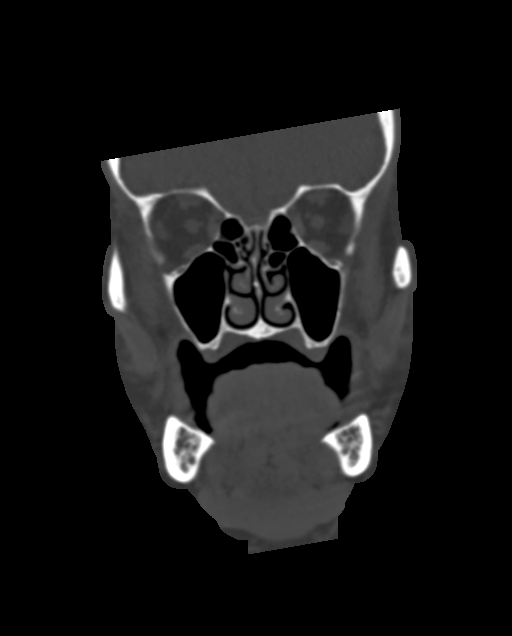

[Series 7: sagittal soft · sagittal · 0.38mm/px · 3 of 81 slices shown]
[im 27/81  bone]
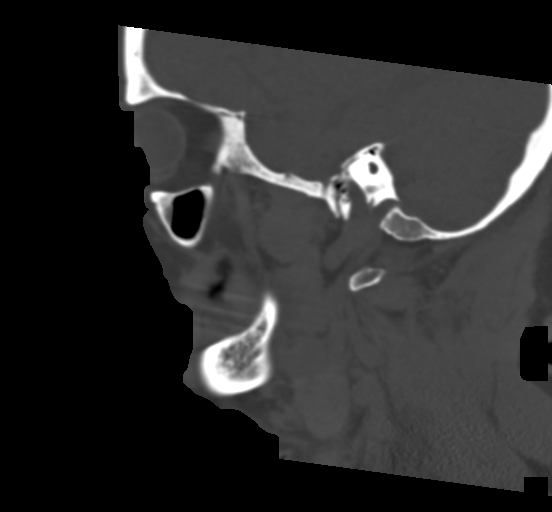
[im 41/81  bone]
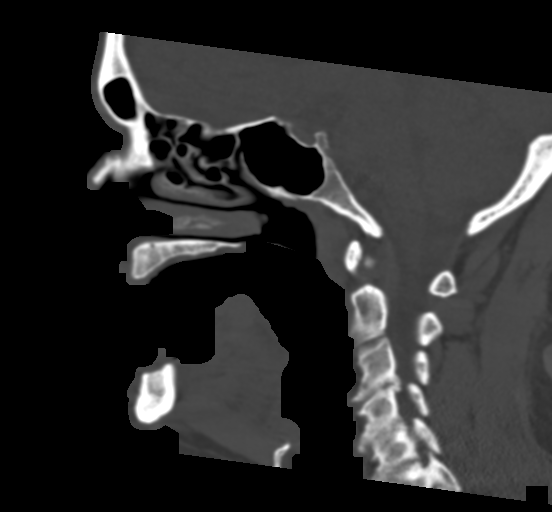
[im 54/81  bone]
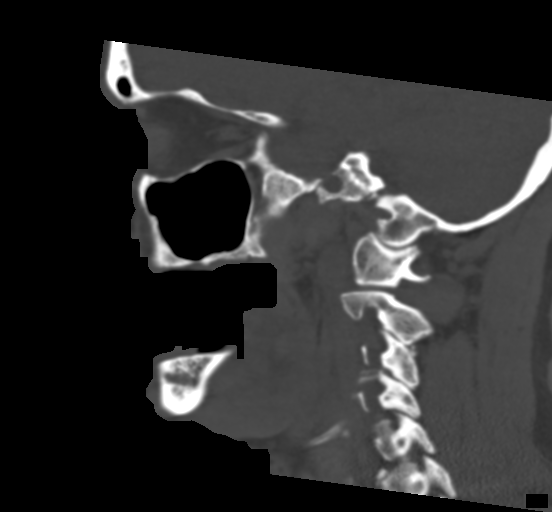

[15 of 47 positions shown; findings below may reference images not displayed]

FINDINGS: CT HEAD FINDINGS

Brain: Remote right craniotomy. Underlying dural thickening is
stable since prior study. No acute intracranial abnormality.
Specifically, no hemorrhage, hydrocephalus, mass lesion, acute
infarction, or significant intracranial injury.

Vascular: No hyperdense vessel or unexpected calcification.

Skull: Right craniotomy.  No acute calvarial abnormality.

Other: None

CT MAXILLOFACIAL FINDINGS

Osseous: Nasal bone fractures bilaterally. Buckling of the nasal
septum, likely fracture. No additional facial fracture.

Orbits: Negative. No traumatic or inflammatory finding.

Sinuses: Clear

Soft tissues: Negative

CT CERVICAL SPINE FINDINGS

Alignment: Normal.

Skull base and vertebrae: No acute fracture. No primary bone lesion
or focal pathologic process.

Soft tissues and spinal canal: No prevertebral fluid or swelling. No
visible canal hematoma.

Disc levels:  Diffuse degenerative disc and facet disease.

Upper chest: No acute finding

Other: None
IMPRESSION: Prior right craniotomy with underlying dural thickening, stable
since prior study. No acute intracranial abnormality.

Nasal bone fractures bilaterally. Buckling of the nasal septum.
These are age indeterminate. No overlying soft tissue swelling.

No acute bony abnormality in the cervical spine.  Spondylosis.

## 2020-02-08 ENCOUNTER — Other Ambulatory Visit: Payer: Self-pay

## 2020-02-08 ENCOUNTER — Emergency Department
Admission: EM | Admit: 2020-02-08 | Discharge: 2020-02-08 | Disposition: A | Discharge status: Home / Self Care | Payer: No Typology Code available for payment source | Attending: Emergency Medicine | Admitting: Emergency Medicine

## 2020-02-08 DIAGNOSIS — F10129 Alcohol abuse with intoxication, unspecified: Secondary | ICD-10-CM | POA: Diagnosis present

## 2020-02-08 DIAGNOSIS — Z79899 Other long term (current) drug therapy: Secondary | ICD-10-CM | POA: Insufficient documentation

## 2020-02-08 DIAGNOSIS — I10 Essential (primary) hypertension: Secondary | ICD-10-CM | POA: Diagnosis not present

## 2020-02-08 DIAGNOSIS — F10921 Alcohol use, unspecified with intoxication delirium: Secondary | ICD-10-CM

## 2020-02-08 LAB — COMPREHENSIVE METABOLIC PANEL
ALT: 18 U/L (ref 0–44)
AST: 26 U/L (ref 15–41)
Albumin: 3.9 g/dL (ref 3.5–5.0)
Alkaline Phosphatase: 106 U/L (ref 38–126)
Anion gap: 12 (ref 5–15)
BUN: 14 mg/dL (ref 8–23)
CO2: 20 mmol/L — ABNORMAL LOW (ref 22–32)
Calcium: 9.3 mg/dL (ref 8.9–10.3)
Chloride: 108 mmol/L (ref 98–111)
Creatinine, Ser: 0.96 mg/dL (ref 0.61–1.24)
GFR calc Af Amer: >60 mL/min (ref >60)
GFR calc non Af Amer: >60 mL/min (ref >60)
Glucose, Bld: 108 mg/dL — ABNORMAL HIGH (ref 70–99)
Potassium: 3.3 mmol/L — ABNORMAL LOW (ref 3.5–5.1)
Sodium: 140 mmol/L (ref 135–145)
Total Bilirubin: 0.9 mg/dL (ref 0.3–1.2)
Total Protein: 7.1 g/dL (ref 6.5–8.1)

## 2020-02-08 LAB — ETHANOL: Alcohol, Ethyl (B): 226 mg/dL — ABNORMAL HIGH (ref <10)

## 2020-02-08 LAB — CBC
HCT: 47.1 % (ref 39.0–52.0)
Hemoglobin: 17 g/dL (ref 13.0–17.0)
MCH: 29.3 pg (ref 26.0–34.0)
MCHC: 36.1 g/dL — ABNORMAL HIGH (ref 30.0–36.0)
MCV: 81.1 fL (ref 80.0–100.0)
Platelets: 269 10*3/uL (ref 150–400)
RBC: 5.81 MIL/uL (ref 4.22–5.81)
RDW: 14.2 % (ref 11.5–15.5)
WBC: 9.6 10*3/uL (ref 4.0–10.5)
nRBC: 0 % (ref 0.0–0.2)

## 2020-02-08 LAB — TROPONIN I (HIGH SENSITIVITY): Troponin I (High Sensitivity): 6 ng/L (ref <18)

## 2020-02-08 LAB — LIPASE, BLOOD: Lipase: 43 U/L (ref 11–51)

## 2020-02-08 MED ORDER — DIPHENHYDRAMINE HCL 50 MG/ML IJ SOLN
INTRAMUSCULAR | Status: AC
  Administered 2020-02-08: 50 mg via INTRAVENOUS
  Filled 2020-02-08: qty 1

## 2020-02-08 MED ORDER — HALOPERIDOL LACTATE 5 MG/ML IJ SOLN
INTRAMUSCULAR | Status: AC
  Administered 2020-02-08: 5 mg via INTRAVENOUS
  Filled 2020-02-08: qty 1

## 2020-02-08 MED ORDER — DIPHENHYDRAMINE HCL 50 MG/ML IJ SOLN: 50.0000 mg | Freq: Once | INTRAMUSCULAR | Status: AC

## 2020-02-08 MED ORDER — HALOPERIDOL LACTATE 5 MG/ML IJ SOLN: 5.0000 mg | Freq: Once | INTRAMUSCULAR | Status: AC

## 2020-02-08 MED ORDER — LORAZEPAM 2 MG/ML IJ SOLN
2.0000 mg | Freq: Once | INTRAMUSCULAR | Status: AC
  Administered 2020-02-08: 2 mg via INTRAVENOUS

## 2020-02-08 MED ORDER — LORAZEPAM 2 MG/ML IJ SOLN
INTRAMUSCULAR | Status: AC
  Filled 2020-02-08: qty 1

## 2020-02-08 NOTE — ED Triage Notes (Addendum)
Pt here via [facility] from outside a hotel.  Per EMS, pt was found by police outside of a hotel. Pt refused to speak to police and they believed he was having a heart attack because he was "staring and not talking". Pt talking at this time, pt has strong smell of alcohol and reports he drank "half a pint of 100% vodka".  Ems VS- bp 160/100, cbg 102, oxygen 98% RA.

## 2020-02-08 NOTE — ED Notes (Signed)
Pt resting comfortably

## 2020-02-08 NOTE — ED Notes (Signed)
Pt being verbally abusive towards staff, pt yelling out "where is my woman, I want some god damn pussy". Pt yelling at this RN and Ambulance person. Md Owens Shark at bedside. See order for IV ativan, haldol, and benadryl.

## 2020-02-08 NOTE — ED Notes (Signed)
Pt yelling verbal abuse at this RN and Ambulance person. Pt stating "where is my goddamn woman, I want some fucking pussy".

## 2020-02-08 NOTE — ED Provider Notes (Signed)
[facility] Emergency Department Provider Note  ____________________________________________   First MD Initiated Contact with Patient 02/08/20 0028     (approximate)  I have reviewed the triage vital signs and the nursing notes.  Level 5 caveat history review of system limited secondary to altered mental status most likely secondary to alcohol intoxication  HISTORY  Chief Complaint Alcohol Intoxication   HPI Omar Davis is a 69 y.o. male presents to the emergency department via EMS found [place] of the [address] by police.  EMS states that the patient appeared to be intoxicated.  Patient was staring and not talking to the police officers prompting EMS activation.  Patient presents to the emergency department speaking stating "go find my woman".  Patient does admit to drinking half a pint of 1% vodka tonight.  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 Authorizing Provider   amLODipine (NORVASC) 10 MG tablet Take 10 mg by mouth daily.    [provider]  atorvastatin (LIPITOR) 80 MG tablet Take 40 mg by mouth at bedtime.    [provider]  cyclobenzaprine (FLEXERIL) 10 MG tablet Take 10 mg by mouth 3 (three) times daily as needed for muscle spasms.    [provider]  DULoxetine (CYMBALTA) 30 MG capsule Take 1 capsule (30 mg total) by mouth daily. Patient taking differently: Take 90 mg by mouth daily.  02/07/19   Clapacs, Madie Reno, MD  hydrOXYzine (ATARAX/VISTARIL) 25 MG tablet Take 25-50 mg by mouth at bedtime.    [provider]  ibuprofen (ADVIL) 600 MG tablet Take 1 tablet (600 mg total) by mouth every 8 (eight) hours as needed. 11/18/19   Sable Feil, PA-C  naproxen (NAPROSYN) 500 MG tablet Take 1 tablet (500 mg total) by mouth 2 (two) times daily as needed for moderate pain. 02/07/19   Clapacs, Madie Reno, MD  pantoprazole (PROTONIX) 40 MG tablet Take 40 mg by mouth 2 (two) times daily.    [provider]  pravastatin (PRAVACHOL) 80 MG tablet Take 1 tablet (80 mg total) by mouth at bedtime. Patient not taking: Reported on 08/08/2019 02/07/19   Clapacs, Madie Reno, MD  traMADol (ULTRAM) 50 MG tablet Take 1 tablet (50 mg total) by mouth every 12 (twelve) hours as needed. 11/18/19   Sable Feil, PA-C  traZODone (DESYREL) 100 MG tablet Take 100-200 mg by mouth at bedtime  as needed for sleep.    [provider]  triamcinolone cream (KENALOG) 0.1 % Apply 1 application topically 2 (two) times daily. 07/23/19   [provider]    Allergies Codeine, Gabapentin, Lisinopril, and Penicillins  No family history on file.  Social History Social History   Tobacco Use   Smoking status: Never Smoker   Smokeless tobacco: Never Used  Scientific laboratory technician Use: Unknown  Substance Use Topics   Alcohol use: Yes   Drug use: Not Currently    Review of Systems Constitutional: No fever/chills Eyes: No visual  changes. ENT: No sore throat. Cardiovascular: Denies chest pain. Respiratory: Denies shortness of breath. Gastrointestinal: No abdominal pain.  No nausea, no vomiting.  No diarrhea.  No constipation. Genitourinary: Negative for dysuria. Musculoskeletal: Negative for neck pain.  Negative for back pain. Integumentary: Negative for rash. Neurological: Negative for headaches, focal weakness or numbness. Psychiatric:  Presumed alcohol intoxication.   ____________________________________________   PHYSICAL EXAM:  VITAL SIGNS: ED Triage Vitals  Enc Vitals Group     BP 02/08/20 0032 (!) 142/93     Pulse Rate 02/08/20 0032 90     Resp 02/08/20 0032 (!) 24     Temp 02/08/20 0032 97.8 F (36.6 C)     Temp Source 02/08/20 0032 Oral     SpO2 02/08/20 0032 100 %     Weight 02/08/20 0028 90 kg (198 lb 6.6 oz)     Height 02/08/20 0028 1.727 m (5\' 8" )     Head Circumference --      Peak Flow --      Pain Score 02/08/20 0028 0     Pain Loc --      Pain Edu? --      Excl. in [place]? --     Constitutional: Alert and oriented.  Appears intoxicated Eyes: Conjunctivae are normal.  Head: Atraumatic. Mouth/Throat: Patient is wearing a mask. Neck: No stridor.  No meningeal signs.   Cardiovascular: Normal rate, regular rhythm. Good peripheral circulation. Grossly normal heart sounds. Respiratory: Normal respiratory effort.  No retractions. Gastrointestinal: Soft and nontender. No distention.  Musculoskeletal: No lower extremity tenderness nor edema. No gross deformities of extremities. Neurologic:  Normal speech and language. No gross focal neurologic deficits are appreciated.  Skin:  Skin is warm, dry and intact. Psychiatric: Mood and affect are normal. Speech and behavior are normal.  ____________________________________________   LABS (all labs ordered are listed, but only abnormal results are displayed)  Labs Reviewed  ETHANOL - Abnormal; Notable for the following components:       Result Value   Alcohol, Ethyl (B) 226 (*)    All other components within normal limits  CBC - Abnormal; Notable for the following components:   MCHC 36.1 (*)    All other components within normal limits  COMPREHENSIVE METABOLIC PANEL - Abnormal; Notable for the following components:   Potassium 3.3 (*)    CO2 20 (*)    Glucose, Bld 108 (*)    All other components within normal limits  LIPASE, BLOOD  TROPONIN I (HIGH SENSITIVITY)   ED ECG REPORT I, [place] N Khoury Siemon, the attending physician, personally viewed and interpreted this ECG.   Date: 02/08/2020  EKG Time: 12:40 AM  Rate: 91  Rhythm: Normal sinus rhythm  Axis: Normal  Intervals: Normal  ST&T Change: None    Procedures   ____________________________________________   INITIAL IMPRESSION / MDM / ASSESSMENT AND PLAN / ED COURSE  As  part of my medical decision making, I reviewed the following data within the electronic MEDICAL RECORD NUMBER  69 year old male presented with above-stated history and physical exam secondary to alcohol intoxication which was confirmed with laboratory data revealing EtOH level of 226.  Patient became very belligerent verbally abusive to the staff with threatening behavior and as such was chemically sedated with Haldol 5 mg IV, Benadryl 50 mg IV, and Ativan 2 mg IV.  On reevaluation patient resting comfortably vital signs stable.  Await sobriety with plan for discharge.  ____________________________________________  FINAL CLINICAL IMPRESSION(S) / ED DIAGNOSES  Final diagnoses:  Alcohol intoxication with delirium ([place])     MEDICATIONS GIVEN DURING THIS VISIT:  Medications  LORazepam (ATIVAN) injection 2 mg (2 mg Intravenous Given 02/08/20 0104)  diphenhydrAMINE (BENADRYL) injection 50 mg (50 mg Intravenous Given 02/08/20 0104)  haloperidol lactate (HALDOL) injection 5 mg (5 mg Intravenous Given 02/08/20 0105)     ED Discharge Orders    None      *Please note:  Omar Davis was  evaluated in Emergency Department on 02/08/2020 for the symptoms described in the history of present illness. He was evaluated in the context of the global COVID-19 pandemic, which necessitated consideration that the patient might be at risk for infection with the SARS-CoV-2 virus that causes COVID-19. Institutional protocols and algorithms that pertain to the evaluation of patients at risk for COVID-19 are in a state of rapid change based on information released by regulatory bodies including the CDC and federal and state organizations. These policies and algorithms were followed during the patient's care in the ED.  Some ED evaluations and interventions may be delayed as a result of limited staffing during and after the pandemic.*  Note:  This document was prepared using Dragon voice recognition software and may include unintentional dictation errors.   Gregor Hams, MD 02/08/20 [phone]

## 2020-02-18 ENCOUNTER — Encounter: Payer: Self-pay | Admitting: *Deleted

## 2020-02-18 ENCOUNTER — Other Ambulatory Visit: Payer: Self-pay

## 2020-02-18 ENCOUNTER — Emergency Department: Payer: No Typology Code available for payment source

## 2020-02-18 ENCOUNTER — Emergency Department
Admission: EM | Admit: 2020-02-18 | Discharge: 2020-02-19 | Disposition: A | Discharge status: Home / Self Care | Payer: No Typology Code available for payment source | Attending: Emergency Medicine | Admitting: Emergency Medicine

## 2020-02-18 DIAGNOSIS — Y908 Blood alcohol level of 240 mg/100 ml or more: Secondary | ICD-10-CM | POA: Diagnosis not present

## 2020-02-18 DIAGNOSIS — F10921 Alcohol use, unspecified with intoxication delirium: Secondary | ICD-10-CM

## 2020-02-18 DIAGNOSIS — I10 Essential (primary) hypertension: Secondary | ICD-10-CM | POA: Insufficient documentation

## 2020-02-18 DIAGNOSIS — Z23 Encounter for immunization: Secondary | ICD-10-CM | POA: Insufficient documentation

## 2020-02-18 DIAGNOSIS — W0110XA Fall on same level from slipping, tripping and stumbling with subsequent striking against unspecified object, initial encounter: Secondary | ICD-10-CM | POA: Diagnosis not present

## 2020-02-18 DIAGNOSIS — S161XXA Strain of muscle, fascia and tendon at neck level, initial encounter: Secondary | ICD-10-CM | POA: Insufficient documentation

## 2020-02-18 DIAGNOSIS — S0990XA Unspecified injury of head, initial encounter: Secondary | ICD-10-CM | POA: Insufficient documentation

## 2020-02-18 DIAGNOSIS — Z20822 Contact with and (suspected) exposure to covid-19: Secondary | ICD-10-CM | POA: Diagnosis not present

## 2020-02-18 DIAGNOSIS — S199XXA Unspecified injury of neck, initial encounter: Secondary | ICD-10-CM | POA: Diagnosis not present

## 2020-02-18 DIAGNOSIS — F10121 Alcohol abuse with intoxication delirium: Secondary | ICD-10-CM | POA: Diagnosis not present

## 2020-02-18 LAB — CBC WITH DIFFERENTIAL/PLATELET
Abs Immature Granulocytes: 0.25 10*3/uL — ABNORMAL HIGH (ref 0.00–0.07)
Basophils Absolute: 0.1 10*3/uL (ref 0.0–0.1)
Basophils Relative: 1 %
Eosinophils Absolute: 0.5 10*3/uL (ref 0.0–0.5)
Eosinophils Relative: 4 %
HCT: 43.9 % (ref 39.0–52.0)
Hemoglobin: 15.6 g/dL (ref 13.0–17.0)
Immature Granulocytes: 2 %
Lymphocytes Relative: 17 %
Lymphs Abs: 2.1 10*3/uL (ref 0.7–4.0)
MCH: 29.3 pg (ref 26.0–34.0)
MCHC: 35.5 g/dL (ref 30.0–36.0)
MCV: 82.5 fL (ref 80.0–100.0)
Monocytes Absolute: 1.1 10*3/uL — ABNORMAL HIGH (ref 0.1–1.0)
Monocytes Relative: 9 %
Neutro Abs: 8.5 10*3/uL — ABNORMAL HIGH (ref 1.7–7.7)
Neutrophils Relative %: 67 %
Platelets: 293 10*3/uL (ref 150–400)
RBC: 5.32 MIL/uL (ref 4.22–5.81)
RDW: 13.4 % (ref 11.5–15.5)
WBC: 12.6 10*3/uL — ABNORMAL HIGH (ref 4.0–10.5)
nRBC: 0 % (ref 0.0–0.2)

## 2020-02-18 LAB — COMPREHENSIVE METABOLIC PANEL
ALT: 29 U/L (ref 0–44)
AST: 37 U/L (ref 15–41)
Albumin: 3.7 g/dL (ref 3.5–5.0)
Alkaline Phosphatase: 105 U/L (ref 38–126)
Anion gap: 11 (ref 5–15)
BUN: 22 mg/dL (ref 8–23)
CO2: 20 mmol/L — ABNORMAL LOW (ref 22–32)
Calcium: 9 mg/dL (ref 8.9–10.3)
Chloride: 107 mmol/L (ref 98–111)
Creatinine, Ser: 1.06 mg/dL (ref 0.61–1.24)
GFR calc Af Amer: >60 mL/min (ref >60)
GFR calc non Af Amer: >60 mL/min (ref >60)
Glucose, Bld: 100 mg/dL — ABNORMAL HIGH (ref 70–99)
Potassium: 4.4 mmol/L (ref 3.5–5.1)
Sodium: 138 mmol/L (ref 135–145)
Total Bilirubin: 0.9 mg/dL (ref 0.3–1.2)
Total Protein: 7 g/dL (ref 6.5–8.1)

## 2020-02-18 LAB — LIPASE, BLOOD: Lipase: 49 U/L (ref 11–51)

## 2020-02-18 LAB — SALICYLATE LEVEL: Salicylate Lvl: <7 mg/dL — ABNORMAL LOW (ref 7.0–30.0)

## 2020-02-18 LAB — ETHANOL: Alcohol, Ethyl (B): 271 mg/dL — ABNORMAL HIGH (ref <10)

## 2020-02-18 LAB — ACETAMINOPHEN LEVEL: Acetaminophen (Tylenol), Serum: <10 ug/mL — ABNORMAL LOW (ref 10–30)

## 2020-02-18 LAB — GLUCOSE, CAPILLARY: Glucose-Capillary: 100 mg/dL — ABNORMAL HIGH (ref 70–99)

## 2020-02-18 MED ORDER — THIAMINE HCL 100 MG/ML IJ SOLN
Freq: Once | INTRAVENOUS | Status: AC
  Filled 2020-02-18: qty 1000

## 2020-02-18 MED ORDER — LORAZEPAM 2 MG/ML IJ SOLN
2.0000 mg | Freq: Once | INTRAMUSCULAR | Status: AC
  Administered 2020-02-18: 2 mg via INTRAMUSCULAR

## 2020-02-18 MED ORDER — TETANUS-DIPHTH-ACELL PERTUSSIS 5-2.5-18.5 LF-MCG/0.5 IM SUSP
0.5000 mL | Freq: Once | INTRAMUSCULAR | Status: AC
  Administered 2020-02-18: 0.5 mL via INTRAMUSCULAR
  Filled 2020-02-18: qty 0.5

## 2020-02-18 MED ORDER — LORAZEPAM 2 MG/ML IJ SOLN
1.0000 mg | Freq: Once | INTRAMUSCULAR | Status: AC
  Administered 2020-02-18: 1 mg via INTRAVENOUS

## 2020-02-18 MED ORDER — SODIUM CHLORIDE 0.9 % IV BOLUS
1000.0000 mL | Freq: Once | INTRAVENOUS | Status: AC
  Administered 2020-02-18: 1000 mL via INTRAVENOUS

## 2020-02-18 MED ORDER — LORAZEPAM 2 MG/ML IJ SOLN
1.0000 mg | Freq: Once | INTRAMUSCULAR | Status: DC
  Filled 2020-02-18: qty 1

## 2020-02-18 MED ORDER — HALOPERIDOL LACTATE 5 MG/ML IJ SOLN
10.0000 mg | Freq: Once | INTRAMUSCULAR | Status: AC
  Administered 2020-02-18: 10 mg via INTRAMUSCULAR

## 2020-02-18 NOTE — ED Provider Notes (Addendum)
[facility] Emergency Department Provider Note   ____________________________________________   First MD Initiated Contact with Patient 02/18/20 1647     (approximate)  I have reviewed the triage vital signs and the nursing notes.   HISTORY  Chief Complaint Head Injury, Fall, and Alcohol Intoxication  History limited by combativeness  HPI Omar Davis is a 69 y.o. male who frequently comes in intoxicated.  He appears to be intoxicated now.  He comes in screaming and fighting and combative.  He will not follow any instructions we cannot talk him down.  He is threatening to hit other people climb off the bed he does not appear to be able to walk well as he has already fallen and hit his head and has a lot of blood on his forehead with some apparently superficial lacerations.  We hold him onto the stretcher and give him 10 of Haldol and 2 of Ativan in attempt to calm him down so we can evaluate him better.  Patient has instructions in his back pocket that EMS found for chronic back pain and sciatica         Past Medical History:  Diagnosis Date  . Chronic pain   . Depressed   . Hypertension   . PTSD (post-traumatic stress disorder)   . TBI (traumatic brain injury) [facility])     Patient Active Problem List   Diagnosis Date Noted  . Iron deficiency anemia 04/08/2019  . Alcohol intoxication with delirium ([place])   . Sepsis ([place]) 02/08/2019  . Essential hypertension 02/05/2019  . Alcohol-induced mood disorder ([place]) 01/24/2019  . Alcohol dependence with intoxication, uncomplicated ([place]) 07/37/1062  . Major depressive disorder, recurrent severe without psychotic features ([place]) 08/24/2018  . Suicidal ideation 09/25/2015  . Alcohol abuse 09/09/2015  . PTSD (post-traumatic stress disorder) 09/09/2015  . Agitation 09/09/2015  . Depressed   . Chronic pain     Past Surgical History:  Procedure Laterality Date  . BACK SURGERY    . HERNIA REPAIR    .  KNEE SURGERY    . KNEE SURGERY    . prostectomy N/A     Prior to Admission medications   Medication Sig Start Date End Date Taking? Authorizing Provider  DULoxetine (CYMBALTA) 30 MG capsule Take 1 capsule (30 mg total) by mouth daily. Patient not taking: Reported on 02/18/2020 02/07/19   Clapacs, Madie Reno, MD  ibuprofen (ADVIL) 600 MG tablet Take 1 tablet (600 mg total) by mouth every 8 (eight) hours as needed. Patient not taking: Reported on 02/18/2020 11/18/19   Sable Feil, PA-C  naproxen (NAPROSYN) 500 MG tablet Take 1 tablet (500 mg total) by mouth 2 (two) times daily as needed for moderate pain. Patient not taking: Reported on 02/18/2020 02/07/19   Clapacs, Madie Reno, MD  pravastatin (PRAVACHOL) 80 MG tablet Take 1 tablet (80 mg total) by mouth at bedtime. Patient not taking: Reported on 08/08/2019 02/07/19   Clapacs, Madie Reno, MD  traMADol (ULTRAM) 50 MG tablet Take 1 tablet (50 mg total) by mouth every 12 (twelve) hours as needed. 11/18/19   Sable Feil, PA-C    Allergies Codeine, Gabapentin, Lisinopril, and Penicillins  History reviewed. No pertinent family history.  Social History Social History   Tobacco Use  . Smoking status: Never Smoker  . Smokeless tobacco: Never Used  Vaping Use  . Vaping Use: Unknown  Substance Use Topics  . Alcohol use: Yes  . Drug use: Not Currently  Review of Systems Unable to obtain  ____________________________________________   PHYSICAL EXAM:  VITAL SIGNS: ED Triage Vitals  Enc Vitals Group     BP      Pulse      Resp      Temp      Temp src      SpO2      Weight      Height      Head Circumference      Peak Flow      Pain Score      Pain Loc      Pain Edu?      Excl. in [place]?     Constitutional: Alert combative refusing to follow commands Eyes: Conjunctivae are injected. PERRL. EOMI. Head: Blood on his forehead with some apparent fully superficial lacerations above the right eye on the forehead Nose: No  congestion/rhinnorhea. Mouth/Throat: Mucous membranes are slightly dry oropharynx non-erythematous. Neck: No stridor Cardiovascular: Normal rate, regular rhythm. Grossly normal heart sounds.  Good peripheral circulation. Respiratory: Normal respiratory effort.  No retractions. Lungs CTAB. Gastrointestinal: Soft and nontender. No distention. No abdominal bruits.  Musculoskeletal: Patient moving all 4 extremities very equally and well. Neurologic:  Normal speech and language. No gross focal neurologic deficits are appreciated.  Skin:  Skin is warm, dry and intact except from forehead.    ____________________________________________   LABS (all labs ordered are listed, but only abnormal results are displayed)  Labs Reviewed  GLUCOSE, CAPILLARY - Abnormal; Notable for the following components:      Result Value   Glucose-Capillary 100 (*)    All other components within normal limits  COMPREHENSIVE METABOLIC PANEL - Abnormal; Notable for the following components:   CO2 20 (*)    Glucose, Bld 100 (*)    All other components within normal limits  CBC WITH DIFFERENTIAL/PLATELET - Abnormal; Notable for the following components:   WBC 12.6 (*)    Neutro Abs 8.5 (*)    Monocytes Absolute 1.1 (*)    Abs Immature Granulocytes 0.25 (*)    All other components within normal limits  ETHANOL - Abnormal; Notable for the following components:   Alcohol, Ethyl (B) 271 (*)    All other components within normal limits  ACETAMINOPHEN LEVEL - Abnormal; Notable for the following components:   Acetaminophen (Tylenol), Serum <10 (*)    All other components within normal limits  SALICYLATE LEVEL - Abnormal; Notable for the following components:   Salicylate Lvl <0.6 (*)    All other components within normal limits  RESPIRATORY PANEL BY RT PCR (FLU A&B, COVID)  LIPASE, BLOOD   ____________________________________________  EKG  EKG read interpreted by me shows normal sinus rhythm rate of 93 normal  axis no acute ST-T wave changes ____________________________________________  RADIOLOGY  ED MD interpretation:   Official radiology report(s): CT Head Wo Contrast  Result Date: 02/18/2020 CLINICAL DATA:  Head trauma, minor. Neck trauma. Additional provided: Contusion and multiple lacerations to right forehead after fall sustaining head injury. EXAM: CT HEAD WITHOUT CONTRAST CT CERVICAL SPINE WITHOUT CONTRAST TECHNIQUE: Multidetector CT imaging of the head and cervical spine was performed following the standard protocol without intravenous contrast. Multiplanar CT image reconstructions of the cervical spine were also generated. COMPARISON:  Head CT 03/28/2019.  CT cervical spine 03/28/2019. FINDINGS: CT HEAD FINDINGS Brain: Stable, mild generalized cerebral atrophy. Stable, mild ill-defined hypoattenuation within the cerebral white matter which is nonspecific, but consistent with chronic small vessel ischemic disease. There is no  acute intracranial hemorrhage. No demarcated cortical infarct. No extra-axial fluid collection. No evidence of intracranial mass. No midline shift. Vascular: No hyperdense vessel.  Atherosclerotic calcifications. Skull: Redemonstrated sequela of prior right-sided craniotomy/cranioplasty with underlying dural thickening. No calvarial fracture. Sinuses/Orbits: Visualized orbits show no acute finding. Mild ethmoid sinus mucosal thickening. No significant mastoid effusion. Redemonstrated chronic, displaced bilateral nasal bone fractures. Other: Right periorbital and forehead soft tissue swelling/hematoma CT CERVICAL SPINE FINDINGS Alignment: Straightening of the expected cervical lordosis. 2 mm C3-C4 grade 1 retrolisthesis. Rightward rotation of C1 upon C2 may be related to patient head positioning at the time of examination. Skull base and vertebrae: The basion-dental and atlanto-dental intervals are maintained.No evidence of acute fracture to the cervical spine. Soft tissues and  spinal canal: No prevertebral fluid or swelling. No visible canal hematoma. Disc levels: Cervical spondylosis with multilevel disc space narrowing, posterior disc osteophytes, uncovertebral and facet hypertrophy. Disc space narrowing is severe at the C3-C4 through T1-T2 levels. No high-grade bony spinal canal stenosis. Multilevel bony neural foraminal narrowing. Upper chest: No consolidation within the imaged lung apices. No visible pneumothorax. 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 IMPRESSION: CT head: 1. No evidence of acute intracranial abnormality. 2. Right forehead and periorbital soft tissue swelling/hematoma. 3. Stable mild generalized cerebral atrophy and chronic small vessel ischemic disease. 4. Redemonstrated chronic sequela of prior right-sided craniotomy/cranioplasty. 5. Mild ethmoid sinus mucosal thickening. 6. Redemonstrated chronic, displaced bilateral nasal bone fractures. CT cervical spine: 1. No evidence of acute fracture to the cervical spine. 2. Rightward rotation of C1 upon C2 may be related to patient head positioning at the time of examination. However, clinical correlation is recommended. 3. Unchanged 2 mm C3-C4 grade 1 retrolisthesis. 4. Advanced cervical spondylosis as described. Electronically Signed   By: Kellie Simmering DO   On: 02/18/2020 18:49   CT Cervical Spine Wo Contrast  Result Date: 02/18/2020 CLINICAL DATA:  Head trauma, minor. Neck trauma. Additional provided: Contusion and multiple lacerations to right forehead after fall sustaining head injury. EXAM: CT HEAD WITHOUT CONTRAST CT CERVICAL SPINE WITHOUT CONTRAST TECHNIQUE: Multidetector CT imaging of the head and cervical spine was performed following the standard protocol without intravenous contrast. Multiplanar CT image reconstructions of the cervical spine were also generated. COMPARISON:  Head CT 03/28/2019.  CT cervical spine 03/28/2019.  FINDINGS: CT HEAD FINDINGS Brain: Stable, mild generalized cerebral atrophy. Stable, mild ill-defined hypoattenuation within the cerebral white matter which is nonspecific, but consistent with chronic small vessel ischemic disease. There is no acute intracranial hemorrhage. No demarcated cortical infarct. No extra-axial fluid collection. No evidence of intracranial mass. No midline shift. Vascular: No hyperdense vessel.  Atherosclerotic calcifications. Skull: Redemonstrated sequela of prior right-sided craniotomy/cranioplasty with underlying dural thickening. No calvarial fracture. Sinuses/Orbits: Visualized orbits show no acute finding. Mild ethmoid sinus mucosal thickening. No significant mastoid effusion. Redemonstrated chronic, displaced bilateral nasal bone fractures. Other: Right periorbital and forehead soft tissue swelling/hematoma CT CERVICAL SPINE FINDINGS Alignment: Straightening of the expected cervical lordosis. 2 mm C3-C4 grade 1 retrolisthesis. Rightward rotation of C1 upon C2 may be related to patient head positioning at the time of examination. Skull base and vertebrae: The basion-dental and atlanto-dental intervals are maintained.No evidence of acute fracture to the cervical spine. Soft tissues and spinal canal: No prevertebral fluid or swelling. No visible canal hematoma. Disc levels: Cervical spondylosis with multilevel disc space narrowing, posterior disc osteophytes, uncovertebral and facet hypertrophy. Disc space narrowing is  severe at the C3-C4 through T1-T2 levels. No high-grade bony spinal canal stenosis. Multilevel bony neural foraminal narrowing. Upper chest: No consolidation within the imaged lung apices. No visible pneumothorax. 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 IMPRESSION: CT head: 1. No evidence of acute intracranial abnormality. 2. Right forehead and periorbital soft tissue swelling/hematoma. 3. Stable  mild generalized cerebral atrophy and chronic small vessel ischemic disease. 4. Redemonstrated chronic sequela of prior right-sided craniotomy/cranioplasty. 5. Mild ethmoid sinus mucosal thickening. 6. Redemonstrated chronic, displaced bilateral nasal bone fractures. CT cervical spine: 1. No evidence of acute fracture to the cervical spine. 2. Rightward rotation of C1 upon C2 may be related to patient head positioning at the time of examination. However, clinical correlation is recommended. 3. Unchanged 2 mm C3-C4 grade 1 retrolisthesis. 4. Advanced cervical spondylosis as described. Electronically Signed   By: Kellie Simmering DO   On: 02/18/2020 18:49   DG Chest Portable 1 View  Result Date: 02/18/2020 CLINICAL DATA:  Intoxicated, altered mental status. EXAM: PORTABLE CHEST 1 VIEW COMPARISON:  August 14, 2019 FINDINGS: There is no evidence of acute infiltrate, pleural effusion or pneumothorax. The heart size and mediastinal contours are within normal limits. There is moderate severity calcification of the aortic arch. Marked severity degenerative changes are seen involving both shoulders. IMPRESSION: No acute cardiopulmonary disease. Electronically Signed   By: Virgina Norfolk M.D.   On: 02/18/2020 17:47    ____________________________________________   PROCEDURES  Procedure(s) performed (including Critical Care): Patient is having cleaned by nurse.  He has 1 small 1 mm laceration in the middle of the forehead and 2 superficial curving lacerations over the right eye.  These were all recleaned again by me and I used skin glue to close them.  Procedures   ____________________________________________   INITIAL IMPRESSION / ASSESSMENT AND PLAN / ED COURSE  ----------------------------------------- 5:09 PM on 02/18/2020 -----------------------------------------  Patient's blood pressure has been obtained. Somewhat low. We will give him a liter of fluids and see how that does for him.      ----------------------------------------- 9:52 PM on 02/18/2020 -----------------------------------------  Patient is still sedated but doing much better.  Ethanol level is 271 CT of the head and neck read as essentially negative.  Patient was moving his neck through full range of motion when he originally arrived here.  I do not believe there are any fractures or other ligamentous injury.  We will plan on watching the patient till he wakes up and if he is awake and alert and oriented we will be able to let him go otherwise we will evaluate him further and possibly admit him.  He does not have seem to have ingested anything else.  He also has a history of behavior like this in the past.  There is no sign of any head injury or sepsis etc.       ____________________________________________   FINAL CLINICAL IMPRESSION(S) / ED DIAGNOSES  Final diagnoses:  Alcohol intoxication with delirium [facility])     ED Discharge Orders    None      *Please note:  Tayon Parekh was evaluated in Emergency Department on 02/18/2020 for the symptoms described in the history of present illness. He was evaluated in the context of the global COVID-19 pandemic, which necessitated consideration that the patient might be at risk for infection with the SARS-CoV-2 virus that causes COVID-19. Institutional protocols and algorithms that pertain to the evaluation of patients  at risk for COVID-19 are in a state of rapid change based on information released by regulatory bodies including the CDC and federal and state organizations. These policies and algorithms were followed during the patient's care in the ED.  Some ED evaluations and interventions may be delayed as a result of limited staffing during and the pandemic.*   Note:  This document was prepared using Dragon voice recognition software and may include unintentional dictation errors.    Nena Polio, MD 02/18/20 2154    Nena Polio, MD 02/18/20  2155

## 2020-02-18 NOTE — ED Triage Notes (Signed)
Pt has contusion and multiple lacerations to R forehead after fall sustaining head injury.

## 2020-02-18 NOTE — BH Assessment (Signed)
Attempted to assess patient, patient unable to be aroused at this time. Will attempt at a later time.

## 2020-02-18 NOTE — ED Triage Notes (Signed)
Per EMS: Pt intoxicated w/ ETOH, has Pmh of PTSD. Pt entered ED screaming at full volume. Pt poorly controlled to ensure he did not do himself or anyone else harm. V.O> per Dr. Cinda Quest for meds, administered @ 1638 IM, bilateral thighs. PIV started by Olen Cordial, RN L wrist.

## 2020-02-18 NOTE — ED Notes (Signed)
Pt. Blood sugar was 100.

## 2020-02-19 MED ORDER — CHLORDIAZEPOXIDE HCL 25 MG PO CAPS
25.0000 mg | ORAL_CAPSULE | Freq: Once | ORAL | Status: AC
  Administered 2020-02-19: 25 mg via ORAL
  Filled 2020-02-19: qty 1

## 2020-02-19 NOTE — ED Provider Notes (Signed)
-----------------------------------------   5:52 AM on 02/19/2020 -----------------------------------------  I took over care on this patient from Dr. Cinda Quest.  The patient presented with alcohol intoxication and was delirious and combative, requiring sedation with Haldol and Ativan.  Plan was to observe until sober.  On reassessment now, the patient is alert and oriented.  He states he feels comfortable and wants to go home.  He had previously declined inpatient detox when TTS came to see him.  His vital signs are stable (respiratory rate of 33 recorded most recently appears to be a spurious reading; he currently is breathing comfortably at a rate of about 15).  He has a mild tremor but no tongue fasciculation or significant signs of alcohol withdrawal.  He states he wants to go home.  At this time, he is stable for discharge.  I will give a dose of Librium prior to discharge to stave off withdrawal symptoms.  I provided outpatient substance abuse counseling resources.  Return precautions given, and he expresses understanding.   Arta Silence, MD 02/19/20 [phone]

## 2020-02-19 NOTE — Discharge Instructions (Addendum)
Return to the ER for new or worsening tremor or shaking, weakness, vomiting, other withdrawal symptoms, or any other new or worsening symptoms that concern you.

## 2020-03-17 ENCOUNTER — Emergency Department
Admission: EM | Admit: 2020-03-17 | Discharge: 2020-03-18 | Disposition: A | Discharge status: Home / Self Care | Payer: No Typology Code available for payment source | Attending: Emergency Medicine | Admitting: Emergency Medicine

## 2020-03-17 ENCOUNTER — Other Ambulatory Visit: Payer: Self-pay

## 2020-03-17 DIAGNOSIS — F10121 Alcohol abuse with intoxication delirium: Secondary | ICD-10-CM | POA: Diagnosis not present

## 2020-03-17 DIAGNOSIS — R45851 Suicidal ideations: Secondary | ICD-10-CM | POA: Insufficient documentation

## 2020-03-17 DIAGNOSIS — I10 Essential (primary) hypertension: Secondary | ICD-10-CM | POA: Diagnosis not present

## 2020-03-17 DIAGNOSIS — R451 Restlessness and agitation: Secondary | ICD-10-CM | POA: Diagnosis not present

## 2020-03-17 DIAGNOSIS — F1094 Alcohol use, unspecified with alcohol-induced mood disorder: Secondary | ICD-10-CM | POA: Diagnosis not present

## 2020-03-17 DIAGNOSIS — Z20822 Contact with and (suspected) exposure to covid-19: Secondary | ICD-10-CM | POA: Insufficient documentation

## 2020-03-17 DIAGNOSIS — F10229 Alcohol dependence with intoxication, unspecified: Secondary | ICD-10-CM | POA: Diagnosis present

## 2020-03-17 DIAGNOSIS — F10220 Alcohol dependence with intoxication, uncomplicated: Secondary | ICD-10-CM | POA: Diagnosis present

## 2020-03-17 DIAGNOSIS — F4310 Post-traumatic stress disorder, unspecified: Secondary | ICD-10-CM | POA: Diagnosis present

## 2020-03-17 DIAGNOSIS — F1010 Alcohol abuse, uncomplicated: Secondary | ICD-10-CM

## 2020-03-17 DIAGNOSIS — F332 Major depressive disorder, recurrent severe without psychotic features: Secondary | ICD-10-CM | POA: Diagnosis present

## 2020-03-17 DIAGNOSIS — F1020 Alcohol dependence, uncomplicated: Secondary | ICD-10-CM | POA: Diagnosis present

## 2020-03-17 LAB — COMPREHENSIVE METABOLIC PANEL
ALT: 26 U/L (ref 0–44)
AST: 35 U/L (ref 15–41)
Albumin: 3.7 g/dL (ref 3.5–5.0)
Alkaline Phosphatase: 110 U/L (ref 38–126)
Anion gap: 11 (ref 5–15)
BUN: 18 mg/dL (ref 8–23)
CO2: 22 mmol/L (ref 22–32)
Calcium: 9 mg/dL (ref 8.9–10.3)
Chloride: 108 mmol/L (ref 98–111)
Creatinine, Ser: 1.1 mg/dL (ref 0.61–1.24)
GFR, Estimated: >60 mL/min (ref >60)
Glucose, Bld: 93 mg/dL (ref 70–99)
Potassium: 3.9 mmol/L (ref 3.5–5.1)
Sodium: 141 mmol/L (ref 135–145)
Total Bilirubin: 0.8 mg/dL (ref 0.3–1.2)
Total Protein: 6.4 g/dL — ABNORMAL LOW (ref 6.5–8.1)

## 2020-03-17 LAB — RESPIRATORY PANEL BY RT PCR (FLU A&B, COVID)
Influenza A by PCR: NEGATIVE
Influenza B by PCR: NEGATIVE
SARS Coronavirus 2 by RT PCR: NEGATIVE

## 2020-03-17 LAB — CBC WITH DIFFERENTIAL/PLATELET
Abs Immature Granulocytes: 0.14 10*3/uL — ABNORMAL HIGH (ref 0.00–0.07)
Basophils Absolute: 0.1 10*3/uL (ref 0.0–0.1)
Basophils Relative: 1 %
Eosinophils Absolute: 0.5 10*3/uL (ref 0.0–0.5)
Eosinophils Relative: 6 %
HCT: 46.2 % (ref 39.0–52.0)
Hemoglobin: 15.6 g/dL (ref 13.0–17.0)
Immature Granulocytes: 2 %
Lymphocytes Relative: 19 %
Lymphs Abs: 1.7 10*3/uL (ref 0.7–4.0)
MCH: 28.8 pg (ref 26.0–34.0)
MCHC: 33.8 g/dL (ref 30.0–36.0)
MCV: 85.2 fL (ref 80.0–100.0)
Monocytes Absolute: 0.9 10*3/uL (ref 0.1–1.0)
Monocytes Relative: 10 %
Neutro Abs: 5.7 10*3/uL (ref 1.7–7.7)
Neutrophils Relative %: 62 %
Platelets: 232 10*3/uL (ref 150–400)
RBC: 5.42 MIL/uL (ref 4.22–5.81)
RDW: 13.3 % (ref 11.5–15.5)
WBC: 9.1 10*3/uL (ref 4.0–10.5)
nRBC: 0 % (ref 0.0–0.2)

## 2020-03-17 LAB — URINE DRUG SCREEN, QUALITATIVE (ARMC ONLY)
Amphetamines, Ur Screen: NOT DETECTED
Barbiturates, Ur Screen: NOT DETECTED
Benzodiazepine, Ur Scrn: NOT DETECTED
Cannabinoid 50 Ng, Ur ~~LOC~~: NOT DETECTED
Cocaine Metabolite,Ur ~~LOC~~: NOT DETECTED
MDMA (Ecstasy)Ur Screen: NOT DETECTED
Methadone Scn, Ur: NOT DETECTED
Opiate, Ur Screen: NOT DETECTED
Phencyclidine (PCP) Ur S: NOT DETECTED
Tricyclic, Ur Screen: NOT DETECTED

## 2020-03-17 LAB — ETHANOL: Alcohol, Ethyl (B): 156 mg/dL — ABNORMAL HIGH (ref <10)

## 2020-03-17 MED ORDER — THIAMINE HCL 100 MG PO TABS: 100.0000 mg | ORAL_TABLET | Freq: Every day | ORAL | Status: DC

## 2020-03-17 MED ORDER — HALOPERIDOL LACTATE 5 MG/ML IJ SOLN
5.0000 mg | Freq: Once | INTRAMUSCULAR | Status: AC
  Administered 2020-03-17: 5 mg via INTRAMUSCULAR
  Filled 2020-03-17: qty 1

## 2020-03-17 MED ORDER — LORAZEPAM 2 MG/ML IJ SOLN: 0.0000 mg | Freq: Two times a day (BID) | INTRAMUSCULAR | Status: DC

## 2020-03-17 MED ORDER — LORAZEPAM 2 MG/ML IJ SOLN: 0.0000 mg | Freq: Four times a day (QID) | INTRAMUSCULAR | Status: DC

## 2020-03-17 MED ORDER — LORAZEPAM 2 MG PO TABS: 0.0000 mg | ORAL_TABLET | Freq: Two times a day (BID) | ORAL | Status: DC

## 2020-03-17 MED ORDER — DIPHENHYDRAMINE HCL 50 MG/ML IJ SOLN: 50.0000 mg | Freq: Once | INTRAMUSCULAR | Status: DC

## 2020-03-17 MED ORDER — LORAZEPAM 2 MG PO TABS
0.0000 mg | ORAL_TABLET | Freq: Four times a day (QID) | ORAL | Status: DC
  Administered 2020-03-17: 2 mg via ORAL
  Filled 2020-03-17: qty 1

## 2020-03-17 MED ORDER — THIAMINE HCL 100 MG/ML IJ SOLN: 100.0000 mg | Freq: Every day | INTRAMUSCULAR | Status: DC

## 2020-03-17 MED ORDER — DIPHENHYDRAMINE HCL 50 MG/ML IJ SOLN
50.0000 mg | Freq: Once | INTRAMUSCULAR | Status: AC
  Administered 2020-03-17: 50 mg via INTRAMUSCULAR
  Filled 2020-03-17: qty 1

## 2020-03-17 NOTE — ED Triage Notes (Signed)
Pt here for suicidal ideation pt is intoxicated, states his girlfriend kicked him out.

## 2020-03-17 NOTE — ED Provider Notes (Signed)
[facility] Emergency Department Provider Note  ____________________________________________   First MD Initiated Contact with Patient 03/17/20 2031     (approximate)  I have reviewed the triage vital signs and the nursing notes.   HISTORY  Chief Complaint Alcohol Intoxication and Suicidal    HPI Omar Davis is a 69 y.o. male  With h/o chronic EtOH dependence, PTSD, prior TBI, here with SI. Per report, pt got into an argument with his significant other and was kicked out. He called PD at a hotel he was staying at 2/2 reported suicidal ideation. Pt is intoxicated, intermittently agitated and yelling and unable to provide history. Per review of records, pt has a long h/o recurrent ED visits for similar complaints.  Level 5 caveat invoked as remainder of history, ROS, and physical exam limited due to patient's agitation, suicidal ideation, likely intoxication.         Past Medical History:  Diagnosis Date  . Chronic pain   . Depressed   . Hypertension   . PTSD (post-traumatic stress disorder)   . TBI (traumatic brain injury) [facility])     Patient Active Problem List   Diagnosis Date Noted  . Iron deficiency anemia 04/08/2019  . Alcohol intoxication with delirium ([place])   . Sepsis ([place]) 02/08/2019  . Essential hypertension 02/05/2019  . Alcohol-induced mood disorder ([place]) 01/24/2019  . Alcohol dependence with intoxication, uncomplicated ([place]) 60/45/4098  . 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 Suicidal ideation 09/25/2015  . Alcohol abuse 09/09/2015  . PTSD (post-traumatic stress disorder) 09/09/2015  . Agitation 09/09/2015  . Depressed   . Chronic pain     Past Surgical History:  Procedure Laterality Date  . BACK SURGERY    . HERNIA REPAIR    . KNEE SURGERY    . KNEE SURGERY    . prostectomy N/A     Prior to Admission medications   Medication Sig Start Date End Date Taking?  Authorizing Provider  DULoxetine (CYMBALTA) 30 MG capsule Take 1 capsule (30 mg total) by mouth daily. Patient not taking: Reported on 02/18/2020 02/07/19   Clapacs, Madie Reno, MD  ibuprofen (ADVIL) 600 MG tablet Take 1 tablet (600 mg total) by mouth every 8 (eight) hours as needed. Patient not taking: Reported on 02/18/2020 11/18/19   Sable Feil, PA-C  naproxen (NAPROSYN) 500 MG tablet Take 1 tablet (500 mg total) by mouth 2 (two) times daily as needed for moderate pain. Patient not taking: Reported on 02/18/2020 02/07/19   Clapacs, Madie Reno, MD  pravastatin (PRAVACHOL) 80 MG tablet Take 1 tablet (80 mg total) by mouth at bedtime. Patient not taking: Reported on 08/08/2019 02/07/19   Clapacs, Madie Reno, MD  traMADol (ULTRAM) 50 MG tablet Take 1 tablet (50 mg total) by mouth every 12 (twelve) hours as needed. 11/18/19   Sable Feil, PA-C    Allergies Codeine, Gabapentin, Lisinopril, and Penicillins  No family history on file.  Social History Social History   Tobacco Use  . Smoking status: Never Smoker  . Smokeless tobacco: Never Used  Vaping Use  . Vaping Use: Unknown  Substance Use Topics  . Alcohol use: Yes  . Drug use: Not Currently    Review of Systems  Review of Systems  Unable to perform ROS: Mental status change     ____________________________________________  PHYSICAL EXAM:      VITAL SIGNS: ED Triage Vitals  Enc Vitals Group  BP 03/17/20 2028 (!) 150/84     Pulse Rate 03/17/20 2028 94     Resp 03/17/20 2028 20     Temp 03/17/20 2028 98.2 F (36.8 C)     Temp Source 03/17/20 2028 Oral     SpO2 03/17/20 2028 97 %     Weight 03/17/20 2029 190 lb (86.2 kg)     Height --      Head Circumference --      Peak Flow --      Pain Score 03/17/20 2029 6     Pain Loc --      Pain Edu? --      Excl. in [place]? --      Physical Exam Vitals and nursing note reviewed.  Constitutional:      General: He is not in acute distress.    Appearance: He is well-developed.      Comments: Disheveled  HENT:     Head: Normocephalic and atraumatic.  Eyes:     Conjunctiva/sclera: Conjunctivae normal.  Cardiovascular:     Rate and Rhythm: Normal rate and regular rhythm.     Heart sounds: Normal heart sounds.  Pulmonary:     Effort: Pulmonary effort is normal. No respiratory distress.     Breath sounds: No wheezing.  Abdominal:     General: There is no distension.  Musculoskeletal:     Cervical back: Neck supple.  Skin:    General: Skin is warm.     Capillary Refill: Capillary refill takes less than 2 seconds.     Findings: No rash.  Neurological:     Mental Status: He is alert and oriented to person, place, and time.     Motor: No abnormal muscle tone.  Psychiatric:        Mood and Affect: Affect is angry.        Behavior: Behavior is agitated.       ____________________________________________   LABS (all labs ordered are listed, but only abnormal results are displayed)  Labs Reviewed  CBC WITH DIFFERENTIAL/PLATELET - Abnormal; Notable for the following components:      Result Value   Abs Immature Granulocytes 0.14 (*)    All other components within normal limits  COMPREHENSIVE METABOLIC PANEL - Abnormal; Notable for the following components:   Total Protein 6.4 (*)    All other components within normal limits  ETHANOL - Abnormal; Notable for the following components:   Alcohol, Ethyl (B) 156 (*)    All other components within normal limits  URINE DRUG SCREEN, QUALITATIVE ([facility] ONLY)    ____________________________________________  EKG:  ________________________________________  RADIOLOGY All imaging, including plain films, CT scans, and ultrasounds, independently reviewed by me, and interpretations confirmed via formal radiology reads.  ED MD interpretation:     Official radiology report(s): No results found.  ____________________________________________  PROCEDURES   Procedure(s) performed (including Critical  Care):  Procedures  ____________________________________________  INITIAL IMPRESSION / MDM / [place] / ED COURSE  As part of my medical decision making, I reviewed the following data within the [place] notes reviewed and incorporated, Old chart reviewed, Notes from prior ED visits, and [place] Controlled Substance [place] was evaluated in Emergency Department on 03/17/2020 for the symptoms described in the history of present illness. He was evaluated in the context of the global COVID-19 pandemic, which necessitated consideration that the patient might be at risk for infection with  the SARS-CoV-2 virus that causes COVID-19. Institutional protocols and algorithms that pertain to the evaluation of patients at risk for COVID-19 are in a state of rapid change based on information released by regulatory bodies including the CDC and federal and state organizations. These policies and algorithms were followed during the patient's care in the ED.  Some ED evaluations and interventions may be delayed as a result of limited staffing during the pandemic.*     Medical Decision Making:  69 yo M with h/o EtOH dependence, PTSD, here wth reported suicidal ideation after argument with significant other. Pt admits to drinking today but states "less" than usual. No signs of withdrawal.EtOH 156 on arrival. CBC, CMP largely unremarkable. Lytres acceptable. No apparent organic medical emergency. Of note, pt does appear clinically more sober than usual, and is visibly upset. He became agitated when discussing his s.o. but was amenable to haldol/benadryl.   Will plan to reassess once sober, while monitoring on CIWA.   ____________________________________________  FINAL CLINICAL IMPRESSION(S) / ED DIAGNOSES  Final diagnoses:  Alcohol abuse  Suicidal ideation     MEDICATIONS GIVEN DURING THIS VISIT:  Medications  haloperidol lactate (HALDOL)  injection 5 mg (5 mg Intramuscular Given 03/17/20 2043)  diphenhydrAMINE (BENADRYL) injection 50 mg (50 mg Intramuscular Given 03/17/20 2043)     ED Discharge Orders    None       Note:  This document was prepared using Dragon voice recognition software and may include unintentional dictation errors.   Duffy Bruce, MD 03/17/20 2240

## 2020-03-17 NOTE — ED Notes (Signed)
md in with pt again.  Pt belligerent cursing about the president and [place].  Labs drawn with diff.  Pt cooperative, cursing and yelling out at times.

## 2020-03-17 NOTE — ED Notes (Signed)
Pt awakened, hyperventilating.  States i'm so upset.  Pt encouraged to slow breathing down, vital signs wnl.  Pt calmer after sitting and talking with pt.

## 2020-03-17 NOTE — ED Notes (Signed)
Breathing improved.  meds given for anxiety.

## 2020-03-17 NOTE — ED Notes (Signed)
Pt intoxicated.  Pt states he is depressed and no one gives a damn about a [place] vet.  Pt states his girlfriend doesn't care about him anymore either.  md at bedside.  meds given with security in room with this rn.  Pt cooperative.

## 2020-03-17 NOTE — ED Notes (Signed)
Pt sleeping. 

## 2020-03-17 NOTE — ED Notes (Signed)
Pt. Was dressed out by this tech and officer by the door and security. Pt was provided with hospital scrubs Gust Brooms top and bottom). Pt belongings were placed inside bag that was provided by the hospital and it is bagged and labeled.   Belongings are :   Arts development officer of shoes Tie-dye button up shirt  Black underwear

## 2020-03-18 ENCOUNTER — Other Ambulatory Visit: Payer: Self-pay

## 2020-03-18 ENCOUNTER — Encounter: Payer: Self-pay | Admitting: Emergency Medicine

## 2020-03-18 ENCOUNTER — Emergency Department
Admission: EM | Admit: 2020-03-18 | Discharge: 2020-03-19 | Disposition: A | Discharge status: Home / Self Care | Payer: No Typology Code available for payment source | Source: Home / Self Care | Attending: Student in an Organized Health Care Education/Training Program | Admitting: Student in an Organized Health Care Education/Training Program

## 2020-03-18 DIAGNOSIS — F1094 Alcohol use, unspecified with alcohol-induced mood disorder: Secondary | ICD-10-CM | POA: Diagnosis not present

## 2020-03-18 DIAGNOSIS — F1010 Alcohol abuse, uncomplicated: Secondary | ICD-10-CM

## 2020-03-18 DIAGNOSIS — I10 Essential (primary) hypertension: Secondary | ICD-10-CM | POA: Insufficient documentation

## 2020-03-18 DIAGNOSIS — F10121 Alcohol abuse with intoxication delirium: Secondary | ICD-10-CM | POA: Insufficient documentation

## 2020-03-18 LAB — CBC WITH DIFFERENTIAL/PLATELET
Abs Immature Granulocytes: 0.1 10*3/uL — ABNORMAL HIGH (ref 0.00–0.07)
Basophils Absolute: 0.1 10*3/uL (ref 0.0–0.1)
Basophils Relative: 1 %
Eosinophils Absolute: 0.2 10*3/uL (ref 0.0–0.5)
Eosinophils Relative: 2 %
HCT: 45 % (ref 39.0–52.0)
Hemoglobin: 15.7 g/dL (ref 13.0–17.0)
Immature Granulocytes: 1 %
Lymphocytes Relative: 16 %
Lymphs Abs: 1.5 10*3/uL (ref 0.7–4.0)
MCH: 29 pg (ref 26.0–34.0)
MCHC: 34.9 g/dL (ref 30.0–36.0)
MCV: 83.2 fL (ref 80.0–100.0)
Monocytes Absolute: 0.9 10*3/uL (ref 0.1–1.0)
Monocytes Relative: 10 %
Neutro Abs: 6.6 10*3/uL (ref 1.7–7.7)
Neutrophils Relative %: 70 %
Platelets: 237 10*3/uL (ref 150–400)
RBC: 5.41 MIL/uL (ref 4.22–5.81)
RDW: 13.2 % (ref 11.5–15.5)
WBC: 9.4 10*3/uL (ref 4.0–10.5)
nRBC: 0 % (ref 0.0–0.2)

## 2020-03-18 LAB — BASIC METABOLIC PANEL
Anion gap: 14 (ref 5–15)
BUN: 14 mg/dL (ref 8–23)
CO2: 18 mmol/L — ABNORMAL LOW (ref 22–32)
Calcium: 9 mg/dL (ref 8.9–10.3)
Chloride: 108 mmol/L (ref 98–111)
Creatinine, Ser: 1.06 mg/dL (ref 0.61–1.24)
GFR, Estimated: >60 mL/min (ref >60)
Glucose, Bld: 91 mg/dL (ref 70–99)
Potassium: 3.6 mmol/L (ref 3.5–5.1)
Sodium: 140 mmol/L (ref 135–145)

## 2020-03-18 LAB — ETHANOL: Alcohol, Ethyl (B): 196 mg/dL — ABNORMAL HIGH (ref <10)

## 2020-03-18 MED ORDER — DIPHENHYDRAMINE HCL 50 MG/ML IJ SOLN
25.0000 mg | Freq: Once | INTRAMUSCULAR | Status: AC
  Administered 2020-03-18: 25 mg via INTRAMUSCULAR
  Filled 2020-03-18: qty 1

## 2020-03-18 MED ORDER — HALOPERIDOL LACTATE 5 MG/ML IJ SOLN
5.0000 mg | Freq: Once | INTRAMUSCULAR | Status: AC
  Administered 2020-03-18: 5 mg via INTRAMUSCULAR
  Filled 2020-03-18: qty 1

## 2020-03-18 NOTE — ED Triage Notes (Signed)
Pt brought in by [facility] PD; found pt at [place] yelling and lying on sidewalk; pt taken to room 20

## 2020-03-18 NOTE — ED Notes (Signed)
Pt sleeping. 

## 2020-03-18 NOTE — ED Provider Notes (Signed)
Procedures     ----------------------------------------- 11:32 AM on 03/18/2020 -----------------------------------------   Patient is awake and lucid, clinically sober.  Seen by psychiatry Dr. Weber Cooks today who notes that patient denies any SI or HI or hallucinations currently, stable [facility].   Carrie Mew, MD 03/18/20 1133

## 2020-03-18 NOTE — ED Notes (Signed)
Report off to stephen rn 

## 2020-03-18 NOTE — ED Provider Notes (Signed)
[facility] Emergency Department Provider Note    None    (approximate)  I have reviewed the triage vital signs and the nursing notes.   HISTORY  Chief Complaint Psychiatric Evaluation and Alcohol Intoxication  History limited due to acute intoxication agitation  HPI Omar Davis is a 69 y.o. male   well-known to this facility with the below listed past medical history presents to the ER after being found down [address] yelling agitated acting belligerent.  Patient appears in his usual state of intoxication.  He is uncooperative with exam.  He is getting up and beating on the wall.   Past Medical History:  Diagnosis Date  . Chronic pain   . Depressed   . Hypertension   . PTSD (post-traumatic stress disorder)   . TBI (traumatic brain injury) [facility])    History reviewed. No pertinent family history. Past Surgical History:  Procedure Laterality Date  . BACK SURGERY    . HERNIA REPAIR    . KNEE SURGERY    . KNEE SURGERY    . prostectomy N/A    Patient Active Problem List   Diagnosis Date Noted  . Iron deficiency anemia 04/08/2019  . Alcohol intoxication with delirium ([place])   . Sepsis ([place]) 02/08/2019  . Essential hypertension 02/05/2019  . Alcohol-induced mood disorder ([place]) 01/24/2019  . Alcohol dependence with intoxication, uncomplicated ([place]) 69/62/9528  . Major depressive disorder, recurrent severe without psychotic features ([place]) 08/24/2018  . Suicidal ideation 09/25/2015  . Alcohol abuse 09/09/2015  . PTSD (post-traumatic stress disorder) 09/09/2015  . Agitation 09/09/2015  . Depressed   . Chronic pain       Prior to Admission medications   Medication Sig Start Date End Date Taking? Authorizing Provider  DULoxetine (CYMBALTA) 30 MG capsule Take 1 capsule (30 mg total) by mouth daily. Patient not taking: Reported on 02/18/2020 02/07/19   Clapacs, Madie Reno, MD  ibuprofen (ADVIL) 600 MG tablet Take 1 tablet (600 mg total) by mouth  every 8 (eight) hours as needed. Patient not taking: Reported on 02/18/2020 11/18/19   Sable Feil, PA-C  naproxen (NAPROSYN) 500 MG tablet Take 1 tablet (500 mg total) by mouth 2 (two) times daily as needed for moderate pain. Patient not taking: Reported on 02/18/2020 02/07/19   Clapacs, Madie Reno, MD  pravastatin (PRAVACHOL) 80 MG tablet Take 1 tablet (80 mg total) by mouth at bedtime. Patient not taking: Reported on 08/08/2019 02/07/19   Clapacs, Madie Reno, MD  traMADol (ULTRAM) 50 MG tablet Take 1 tablet (50 mg total) by mouth every 12 (twelve) hours as needed. 11/18/19   Sable Feil, PA-C    Allergies Codeine, Gabapentin, Lisinopril, and Penicillins    Social History Social History   Tobacco Use  . Smoking status: Never Smoker  . Smokeless tobacco: Never Used  Vaping Use  . Vaping Use: Unknown  Substance Use Topics  . Alcohol use: Yes  . Drug use: Not Currently    Review of Systems Patient denies headaches, rhinorrhea, blurry vision, numbness, shortness of breath, chest pain, edema, cough, abdominal pain, nausea, vomiting, diarrhea, dysuria, fevers, rashes or hallucinations unless otherwise stated above in HPI. ____________________________________________   PHYSICAL EXAM:  VITAL SIGNS: Vitals:   03/18/20 2250  BP: 127/60  Pulse: 93  Resp: 18  Temp: 98.1 F (36.7 C)  SpO2: 95%    Constitutional: intoxicated, disheveled  Eyes: Conjunctivae are normal.  Head: Atraumatic. Nose: No congestion/rhinnorhea. Mouth/Throat: Mucous membranes are moist.  Neck: No stridor. Painless ROM.  Cardiovascular: Normal rate, regular rhythm. Grossly normal heart sounds.  Good peripheral circulation. Respiratory: Normal respiratory effort.  No retractions. Lungs CTAB. Gastrointestinal: Soft and nontender. No distention. No abdominal bruits. No CVA tenderness. Genitourinary:  Musculoskeletal: No lower extremity tenderness nor edema.  No joint effusions. Neurologic:  Normal speech and  language. No gross focal neurologic deficits are appreciated. No facial droop Skin:  Skin is warm, dry and intact. No rash noted. Psychiatric: acute agitation, appears intoxicated ____________________________________________   LABS (all labs ordered are listed, but only abnormal results are displayed)  Results for orders placed or performed during the hospital encounter of 03/18/20 (from the past 24 hour(s))  CBC with Differential     Status: Abnormal   Collection Time: 03/18/20 10:32 PM  Result Value Ref Range   WBC 9.4 4.0 - 10.5 K/uL   RBC 5.41 4.22 - 5.81 MIL/uL   Hemoglobin 15.7 13.0 - 17.0 g/dL   HCT 45.0 39 - 52 %   MCV 83.2 80.0 - 100.0 fL   MCH 29.0 26.0 - 34.0 pg   MCHC 34.9 30.0 - 36.0 g/dL   RDW 13.2 11.5 - 15.5 %   Platelets 237 150 - 400 K/uL   nRBC 0.0 0.0 - 0.2 %   Neutrophils Relative % 70 %   Neutro Abs 6.6 1.7 - 7.7 K/uL   Lymphocytes Relative 16 %   Lymphs Abs 1.5 0.7 - 4.0 K/uL   Monocytes Relative 10 %   Monocytes Absolute 0.9 0.1 - 1.0 K/uL   Eosinophils Relative 2 %   Eosinophils Absolute 0.2 0.0 - 0.5 K/uL   Basophils Relative 1 %   Basophils Absolute 0.1 0.0 - 0.1 K/uL   Immature Granulocytes 1 %   Abs Immature Granulocytes 0.10 (H) 0.00 - 0.07 K/uL  Basic metabolic panel     Status: Abnormal   Collection Time: 03/18/20 10:32 PM  Result Value Ref Range   Sodium 140 135 - 145 mmol/L   Potassium 3.6 3.5 - 5.1 mmol/L   Chloride 108 98 - 111 mmol/L   CO2 18 (L) 22 - 32 mmol/L   Glucose, Bld 91 70 - 99 mg/dL   BUN 14 8 - 23 mg/dL   Creatinine, Ser 1.06 0.61 - 1.24 mg/dL   Calcium 9.0 8.9 - 10.3 mg/dL   GFR, Estimated >60 >60 mL/min   Anion gap 14 5 - 15  Ethanol     Status: Abnormal   Collection Time: 03/18/20 10:32 PM  Result Value Ref Range   Alcohol, Ethyl (B) 196 (H) <10 mg/dL   ____________________________________________  EKG My review and personal interpretation at Time: 22:15   Indication: etoh abuse  Rate: 95  Rhythm: sinus Axis:  normal Other: normal intervals, no stemi ____________________________________________   ____________________________________________   PROCEDURES  Procedure(s) performed:  Procedures    Critical Care performed: no ____________________________________________   INITIAL IMPRESSION / ASSESSMENT AND PLAN / ED COURSE  Pertinent labs & imaging results that were available during my care of the patient were reviewed by me and considered in my medical decision making (see chart for details).   DDX: Psychosis, delirium, medication effect, noncompliance, polysubstance abuse, Si, Hi, depression   Rachael Zapanta is a 69 y.o. who presents to the ED with presentation as described above. Patient belligerent and acutely intoxicated. For his safety and that of staff and nearby patients will provide IM calming agent. This appears consistent with his typical intoxication.  Clinical Course as of Mar 19 2327  Wed Mar 18, 2020  2250 Patient resting comfortably now after IM, medication.  Blood work pending.  Vital signs are otherwise stable.  EKG without any prolonged QT.  Patient signed out to oncoming physician pending reassessment once sober.   [PR]    Clinical Course User Index [PR] Merlyn Lot, MD    The patient was evaluated in Emergency Department today for the symptoms described in the history of present illness. He/she was evaluated in the context of the global COVID-19 pandemic, which necessitated consideration that the patient might be at risk for infection with the SARS-CoV-2 virus that causes COVID-19. Institutional protocols and algorithms that pertain to the evaluation of patients at risk for COVID-19 are in a state of rapid change based on information released by regulatory bodies including the CDC and federal and state organizations. These policies and algorithms were followed during the patient's care in the ED.  As part of my medical decision making, I reviewed the  following data within the [place] notes reviewed and incorporated, Labs reviewed, notes from prior ED visits and [place] Controlled Substance Database   ____________________________________________   FINAL CLINICAL IMPRESSION(S) / ED DIAGNOSES  Final diagnoses:  Alcohol abuse      NEW MEDICATIONS STARTED DURING THIS VISIT:  New Prescriptions   No medications on file     Note:  This document was prepared using Dragon voice recognition software and may include unintentional dictation errors.    Merlyn Lot, MD 03/18/20 2328

## 2020-03-18 NOTE — ED Notes (Signed)
Pt to ED via police, pt very agitated, yelling, stating just leave him alone and just kill him.  Pt not acknowledging questions directed to him.  Pt brought straight back to room 20 with security.  Pt reluctantly changed into hospital scrubs and belongings placed into bag by this RN, Larene Beach, RN, and [place], EDT.  Pt pulling away during process, and occasionally swinging out at security.  Unable to reason with patient after multiple attempts.  Order placed by Dr. Quentin Cornwall for haldol 5mg  and benadryl 25mg  to be administered IM.  Medications given emergently by this RN and Larene Beach, RN.

## 2020-03-18 NOTE — ED Notes (Addendum)
Writer attempted to assess patient, however, patient was unable to be aroused. Patient will need to be assessed on day shift.   Per note from [place] MD:  69 yo M with h/o EtOH dependence, PTSD, here wth reported suicidal ideation after argument with significant other. Pt admits to drinking today but states "less" than usual. No signs of withdrawal.EtOH 156 on arrival. CBC, CMP largely unremarkable. Lytres acceptable. No apparent organic medical emergency. Of note, pt does appear clinically more sober than usual, and is visibly upset. He became agitated when discussing his s.o. but was amenable to haldol/benadryl.   Will plan to reassess once sober, while monitoring on CIWA.

## 2020-03-18 NOTE — Consult Note (Signed)
[facility] Face-to-Face Psychiatry Consult   Reason for Consult: Emergency room consult for this 69 year old man with alcohol abuse and chronic mental health issues Referring Physician: Joni Fears Patient Identification: Omar Davis MRN:  [id number] Principal Diagnosis: Alcohol-induced mood disorder ([place]) Diagnosis:  Principal Problem:   Alcohol-induced mood disorder ([place]) Active Problems:   PTSD (post-traumatic stress disorder)   Alcohol dependence with intoxication, uncomplicated ([place])   Major depressive disorder, recurrent severe without psychotic features ([place])   Total Time spent with patient: 1 hour  Subjective:   Omar Davis is a 69 y.o. male patient admitted with "I am ready to go home".  HPI: Patient seen chart reviewed.  Patient also known from prior encounters.  69 year old man brought to the hospital because he had called 911 and made comments about having suicidal ideation.  Patient was intoxicated on arrival with a blood alcohol level around 250.  Patient tells me that he had consumed about 4 of the little "airplane-sized" bottles of vodka last night.  He said he wanted somebody to talk to which is why he called 911.  He had hoped that they would take him to the [facility] but of course that not available they brought him to our emergency room.  Patient says he is drinking approximately that amount most nights.  He relapsed a month or 2 ago after having stayed sober for 6 months.  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  He is getting treatment through the [place] for these things.  This is on top of his chronic depression and posttraumatic stress disorder from his time in [place].  Patient however this morning tells me he is not having any suicidal thoughts.  He feels like he is safe to go home.  He does not have a history of delirium tremens.  He is knowledgeable about his own illness and does not have a history  of acting out when he is sober.  Patient is already engaged with treatment through the [place] and plans to continue with treatment there.  Not having any psychotic symptoms.  No hostility or homicidal ideation  Past Psychiatric History: History of PTSD related to [employer].  Chronic depressive symptoms.  Chronic alcohol abuse which worsens all of the above.  Frequently will make suicidal statements while intoxicated which then go away once he sobers up.  Patient has good insight and continues to struggle with his illness.  Risk to Self:   Risk to Others:   Prior Inpatient Therapy:   Prior Outpatient Therapy:    Past Medical History:  Past Medical History:  Diagnosis Date  . Chronic pain   . Depressed   . Hypertension   . PTSD (post-traumatic stress disorder)   . TBI (traumatic brain injury) [facility])     Past Surgical History:  Procedure Laterality Date  . BACK SURGERY    . HERNIA REPAIR    . KNEE SURGERY    . KNEE SURGERY    . prostectomy N/A    Family History: No family history on file. Family Psychiatric  History: None reported Social History:  Social History   Substance and Sexual Activity  Alcohol Use Yes     Social History   Substance and Sexual Activity  Drug Use Not Currently    Social History   Socioeconomic History  . Marital status: Legally Separated    Spouse name: Not on file  .  Number of children: Not on file  . Years of education: Not on file  . Highest education level: Not on file  Occupational History  . Not on file  Tobacco Use  . Smoking status: Never Smoker  . Smokeless tobacco: Never Used  Vaping Use  . Vaping Use: Unknown  Substance and Sexual Activity  . Alcohol use: Yes  . Drug use: Not Currently  . Sexual activity: Not Currently  Other Topics Concern  . Not on file  Social History Narrative  . Not on file   Social Determinants of Health   Financial Resource Strain:   . Difficulty of Paying Living Expenses: Not on file  Food  Insecurity:   . Worried About Charity fundraiser in the Last Year: Not on file  . Ran Out of Food in the Last Year: Not on file  Transportation Needs:   . Lack of Transportation (Medical): Not on file  . Lack of Transportation (Non-Medical): Not on file  Physical Activity:   . Days of Exercise per Week: Not on file  . Minutes of Exercise per Session: Not on file  Stress:   . Feeling of Stress : Not on file  Social Connections:   . Frequency of Communication with Friends and Family: Not on file  . Frequency of Social Gatherings with Friends and Family: Not on file  . Attends Religious Services: Not on file  . Active Member of Clubs or Organizations: Not on file  . Attends Archivist Meetings: Not on file  . Marital Status: Not on file   Additional Social History:    Allergies:   Allergies  Allergen Reactions  . Codeine Hives and Rash  . Gabapentin Other (See Comments) and Anaphylaxis    seizures Other reaction(s): Other (See Comments) seizures Anaphylactic Shock   . Lisinopril Swelling    Lip Swelling   . Penicillins Rash    Did it involve swelling of the face/tongue/throat, SOB, or low BP? No Did it involve sudden or severe rash/hives, skin peeling, or any reaction on the inside of your mouth or nose? Yes Did you need to seek medical attention at a hospital or doctor's office? No When did it last happen? If all above answers are "NO", may proceed with cephalosporin use.     Labs:  Results for orders placed or performed during the hospital encounter of 03/17/20 (from the past 48 hour(s))  CBC with Differential     Status: Abnormal   Collection Time: 03/17/20  8:53 PM  Result Value Ref Range   WBC 9.1 4.0 - 10.5 K/uL   RBC 5.42 4.22 - 5.81 MIL/uL   Hemoglobin 15.6 13.0 - 17.0 g/dL   HCT 46.2 39 - 52 %   MCV 85.2 80.0 - 100.0 fL   MCH 28.8 26.0 - 34.0 pg   MCHC 33.8 30.0 - 36.0 g/dL   RDW 13.3 11.5 - 15.5 %   Platelets 232 150 - 400 K/uL   nRBC  0.0 0.0 - 0.2 %   Neutrophils Relative % 62 %   Neutro Abs 5.7 1.7 - 7.7 K/uL   Lymphocytes Relative 19 %   Lymphs Abs 1.7 0.7 - 4.0 K/uL   Monocytes Relative 10 %   Monocytes Absolute 0.9 0.1 - 1.0 K/uL   Eosinophils Relative 6 %   Eosinophils Absolute 0.5 0.0 - 0.5 K/uL   Basophils Relative 1 %   Basophils Absolute 0.1 0.0 - 0.1 K/uL   Immature Granulocytes  2 %   Abs Immature Granulocytes 0.14 (H) 0.00 - 0.07 K/uL    Comment: Performed at [facility], [address] [place], [place] [address]  Comprehensive metabolic panel     Status: Abnormal   Collection Time: 03/17/20  8:53 PM  Result Value Ref Range   Sodium 141 135 - 145 mmol/L   Potassium 3.9 3.5 - 5.1 mmol/L   Chloride 108 98 - 111 mmol/L   CO2 22 22 - 32 mmol/L   Glucose, Bld 93 70 - 99 mg/dL    Comment: Glucose reference range applies only to samples taken after fasting for at least 8 hours.   BUN 18 8 - 23 mg/dL   Creatinine, Ser 1.10 0.61 - 1.24 mg/dL   Calcium 9.0 8.9 - 10.3 mg/dL   Total Protein 6.4 (L) 6.5 - 8.1 g/dL   Albumin 3.7 3.5 - 5.0 g/dL   AST 35 15 - 41 U/L   ALT 26 0 - 44 U/L   Alkaline Phosphatase 110 38 - 126 U/L   Total Bilirubin 0.8 0.3 - 1.2 mg/dL   GFR, Estimated >60 >60 mL/min    Comment: (NOTE) Calculated using the CKD-EPI Creatinine Equation (2021)    Anion gap 11 5 - 15    Comment: Performed at [facility], [address] [place], [place] [address]  Ethanol     Status: Abnormal   Collection Time: 03/17/20  8:53 PM  Result Value Ref Range   Alcohol, Ethyl (B) 156 (H) <10 mg/dL    Comment: (NOTE) Lowest detectable limit for serum alcohol is 10 mg/dL.  For medical purposes only. Performed at [facility], [address], [place], [place] [address]   Urine Drug Screen, Qualitative [facility] only)     Status: None   Collection Time: 03/17/20  8:55 PM  Result Value Ref Range   Tricyclic, Ur Screen NONE DETECTED NONE DETECTED   Amphetamines, Ur Screen NONE  DETECTED NONE DETECTED   MDMA (Ecstasy)Ur Screen NONE DETECTED NONE DETECTED   Cocaine Metabolite,Ur [place] NONE DETECTED NONE DETECTED   Opiate, Ur Screen NONE DETECTED NONE DETECTED   Phencyclidine (PCP) Ur S NONE DETECTED NONE DETECTED   Cannabinoid 50 Ng, Ur [place] NONE DETECTED NONE DETECTED   Barbiturates, Ur Screen NONE DETECTED NONE DETECTED   Benzodiazepine, Ur Scrn NONE DETECTED NONE DETECTED   Methadone Scn, Ur NONE DETECTED NONE DETECTED    Comment: (NOTE) Tricyclics + metabolites, urine    Cutoff 1000 ng/mL Amphetamines + metabolites, urine  Cutoff 1000 ng/mL MDMA (Ecstasy), urine              Cutoff 500 ng/mL Cocaine Metabolite, urine          Cutoff 300 ng/mL Opiate + metabolites, urine        Cutoff 300 ng/mL Phencyclidine (PCP), urine         Cutoff 25 ng/mL Cannabinoid, urine                 Cutoff 50 ng/mL Barbiturates + metabolites, urine  Cutoff 200 ng/mL Benzodiazepine, urine              Cutoff 200 ng/mL Methadone, urine                   Cutoff 300 ng/mL  The urine drug screen provides only a preliminary, unconfirmed analytical test result and should not be used for non-medical purposes. Clinical consideration and professional judgment should be applied to  any positive drug screen result due to possible interfering substances. A more specific alternate chemical method must be used in order to obtain a confirmed analytical result. Gas chromatography / mass spectrometry (GC/MS) is the preferred confirm atory method. Performed at [facility], [address] [place], [place] [address]   Respiratory Panel by RT PCR (Flu A&B, Covid) - Nasopharyngeal Swab     Status: None   Collection Time: 03/17/20 10:55 PM   Specimen: Nasopharyngeal Swab  Result Value Ref Range   SARS Coronavirus 2 by RT PCR NEGATIVE NEGATIVE    Comment: (NOTE) SARS-CoV-2 target nucleic acids are NOT DETECTED.  The SARS-CoV-2 RNA is generally detectable in upper respiratoy specimens  during the acute phase of infection. The lowest concentration of SARS-CoV-2 viral copies this assay can detect is 131 copies/mL. A negative result does not preclude SARS-Cov-2 infection and should not be used as the sole basis for treatment or other patient management decisions. A negative result may occur with  improper specimen collection/handling, submission of specimen other than nasopharyngeal swab, presence of viral mutation(s) within the areas targeted by this assay, and inadequate number of viral copies (<131 copies/mL). A negative result must be combined with clinical observations, patient history, and epidemiological information. The expected result is Negative.  Fact Sheet for Patients:  PinkCheek.be  Fact Sheet for Healthcare Providers:  GravelBags.it  This test is no t yet approved or cleared by the [place] FDA and  has been authorized for detection and/or diagnosis of SARS-CoV-2 by FDA under an Emergency Use Authorization (EUA). This EUA will remain  in effect (meaning this test can be used) for the duration of the COVID-19 declaration under Section 564(b)(1) of the Act, 21 U.S.C. section 360bbb-3(b)(1), unless the authorization is terminated or revoked sooner.     Influenza A by PCR NEGATIVE NEGATIVE   Influenza B by PCR NEGATIVE NEGATIVE    Comment: (NOTE) The Xpert Xpress SARS-CoV-2/FLU/RSV assay is intended as an aid in  the diagnosis of influenza from Nasopharyngeal swab specimens and  should not be used as a sole basis for treatment. Nasal washings and  aspirates are unacceptable for Xpert Xpress SARS-CoV-2/FLU/RSV  testing.  Fact Sheet for Patients: PinkCheek.be  Fact Sheet for Healthcare Providers: GravelBags.it  This test is not yet approved or cleared by the [place] FDA and  has been authorized for detection and/or diagnosis  of SARS-CoV-2 by  FDA under an Emergency Use Authorization (EUA). This EUA will remain  in effect (meaning this test can be used) for the duration of the  Covid-19 declaration under Section 564(b)(1) of the Act, 21  U.S.C. section 360bbb-3(b)(1), unless the authorization is  terminated or revoked. Performed at [facility], [address] [place], [place] [address]     Current Facility-Administered Medications  Medication Dose Route Frequency Provider Last Rate Last Admin  . LORazepam (ATIVAN) injection 0-4 mg  0-4 mg Intravenous Q6H Duffy Bruce, MD       Or  . LORazepam (ATIVAN) tablet 0-4 mg  0-4 mg Oral Q6H Duffy Bruce, MD   2 mg at 03/17/20 2252  . [START ON 03/20/2020] LORazepam (ATIVAN) injection 0-4 mg  0-4 mg Intravenous Q12H Duffy Bruce, MD       Or  . Derrill Memo ON 03/20/2020] LORazepam (ATIVAN) tablet 0-4 mg  0-4 mg Oral Q12H Duffy Bruce, MD      . thiamine tablet 100 mg  100 mg Oral Daily Duffy Bruce, MD  Or  . thiamine (B-1) injection 100 mg  100 mg Intravenous Daily Duffy Bruce, MD       Current Outpatient Medications  Medication Sig Dispense Refill  . DULoxetine (CYMBALTA) 30 MG capsule Take 1 capsule (30 mg total) by mouth daily. (Patient not taking: Reported on 02/18/2020) 30 capsule 0  . ibuprofen (ADVIL) 600 MG tablet Take 1 tablet (600 mg total) by mouth every 8 (eight) hours as needed. (Patient not taking: Reported on 02/18/2020) 15 tablet 0  . naproxen (NAPROSYN) 500 MG tablet Take 1 tablet (500 mg total) by mouth 2 (two) times daily as needed for moderate pain. (Patient not taking: Reported on 02/18/2020) 60 tablet 0  . pravastatin (PRAVACHOL) 80 MG tablet Take 1 tablet (80 mg total) by mouth at bedtime. (Patient not taking: Reported on 08/08/2019) 30 tablet 0  . traMADol (ULTRAM) 50 MG tablet Take 1 tablet (50 mg total) by mouth every 12 (twelve) hours as needed. 12 tablet 0    Musculoskeletal: Strength & Muscle Tone: within  normal limits Gait & Station: normal Patient leans: N/A  Psychiatric Specialty Exam: Physical Exam Vitals and nursing note reviewed.  Constitutional:      Appearance: He is well-developed.  HENT:     Head: Normocephalic and atraumatic.  Eyes:     Conjunctiva/sclera: Conjunctivae normal.     Pupils: Pupils are equal, round, and reactive to light.  Cardiovascular:     Heart sounds: Normal heart sounds.  Pulmonary:     Effort: Pulmonary effort is normal.  Abdominal:     Palpations: Abdomen is soft.  Musculoskeletal:        General: Normal range of motion.     Cervical back: Normal range of motion.  Skin:    General: Skin is warm and dry.  Neurological:     General: No focal deficit present.     Mental Status: He is alert.  Psychiatric:        Attention and Perception: Attention normal.        Mood and Affect: Mood is depressed.        Speech: Speech is delayed.        Behavior: Behavior is slowed.        Thought Content: Thought content is not paranoid. Thought content does not include homicidal or suicidal ideation.        Cognition and Memory: Memory is impaired.        Judgment: Judgment normal.     Review of Systems  Constitutional: Negative.   HENT: Negative.   Eyes: Negative.   Respiratory: Negative.   Cardiovascular: Negative.   Gastrointestinal: Negative.   Musculoskeletal: Negative.   Skin: Negative.   Neurological: Negative.   Psychiatric/Behavioral: Positive for dysphoric mood and sleep disturbance. Negative for self-injury and suicidal ideas. The patient is nervous/anxious.     Blood pressure 131/80, pulse 83, temperature 98.2 F (36.8 C), temperature source Oral, resp. rate (!) 22, weight 86.2 kg, SpO2 99 %.Body mass index is 28.89 kg/m.  General Appearance: Disheveled  Eye Contact:  Fair  Speech:  Slow  Volume:  Decreased  Mood:  Dysphoric  Affect:  Congruent  Thought Process:  Coherent  Orientation:  Full (Time, Place, and Person)  Thought  Content:  Logical  Suicidal Thoughts:  No  Homicidal Thoughts:  No  Memory:  Immediate;   Fair Recent;   Fair Remote;   Fair  Judgement:  Fair  Insight:  Fair  Psychomotor Activity:  Decreased  Concentration:  Concentration: Fair  Recall:  [employer] of Knowledge:  Fair  Language:  Fair  Akathisia:  No  Handed:  Right  AIMS (if indicated):     Assets:  Desire for Improvement Financial Resources/Insurance Housing Social Support  ADL's:  Impaired  Cognition:  Impaired,  Mild  Sleep:        Treatment Plan Summary: Daily contact with patient to assess and evaluate symptoms and progress in treatment, Medication management and Plan This is a 69 year old man with chronic mental health problems and recurrent struggles with alcohol.  We know him from many past visits to the emergency room.  This is a pattern we have seen in the past that he will make suicidal statements while intoxicated and then deny them when he is sober.  Patient does have significant risk factors for suicide but also has appropriate outpatient treatment and is probably not likely to benefit from inpatient treatment at this point.  Patient is denying any suicidal thoughts.  Case reviewed with emergency room doctor.  Supportive counseling and encouragement to make sure he continues his outpatient treatment completed.  He can be released from the emergency room at the discretion of medical staff.  Disposition: Patient does not meet criteria for psychiatric inpatient admission. Discussed crisis plan, support from social network, calling 911, coming to the Emergency Department, and calling Suicide Hotline.  Alethia Berthold, MD 03/18/2020 10:46 AM

## 2020-03-18 NOTE — ED Notes (Addendum)
Pt belongings placed into 1 bag with contents of 1 pair shoes, 1 pair blue jeans, 1 button up t shirt, 1 black belt, 1 black wallet, 1 black flip phone.

## 2020-03-19 ENCOUNTER — Telehealth: Payer: Self-pay

## 2020-03-19 NOTE — ED Provider Notes (Signed)
Patient awake and sober. Patient says "I am not even sure how I got here. I feel fine." Denies SI or HI. Stable for Marletta Lor, [place], MD 03/19/20 [phone]

## 2020-03-19 NOTE — Telephone Encounter (Signed)
Patient has been spending time in ER due to pain they want to know if he can get an earlier appointment , New patient appointment scheduled for 11/29

## 2020-03-19 NOTE — Discharge Instructions (Addendum)
You have been seen in the Emergency Department (ED)  today for a psychiatric complaint.  You have been evaluated by psychiatry and we believe you are safe to be discharged from the hospital.   ° °Please return to the Emergency Department (ED)  immediately if you have ANY thoughts of hurting yourself or anyone else, so that we may help you. ° °Please avoid alcohol and drug use. ° °Follow up with your doctor and/or therapist as soon as possible regarding today's ED  visit.  ° °You may call crisis hotline for [place] at [phone]. ° °

## 2020-03-19 NOTE — ED Notes (Signed)
[employer] called to pick pt up.

## 2020-03-19 NOTE — ED Notes (Signed)
Pt belongings bag 1/1 returned to pt.

## 2020-03-20 ENCOUNTER — Emergency Department
Admission: EM | Admit: 2020-03-20 | Discharge: 2020-03-21 | Disposition: A | Discharge status: Home / Self Care | Payer: No Typology Code available for payment source | Attending: Emergency Medicine | Admitting: Emergency Medicine

## 2020-03-20 ENCOUNTER — Emergency Department: Payer: No Typology Code available for payment source

## 2020-03-20 ENCOUNTER — Other Ambulatory Visit: Payer: Self-pay

## 2020-03-20 ENCOUNTER — Encounter: Payer: Self-pay | Admitting: Intensive Care

## 2020-03-20 DIAGNOSIS — E860 Dehydration: Secondary | ICD-10-CM | POA: Diagnosis not present

## 2020-03-20 DIAGNOSIS — E872 Acidosis: Secondary | ICD-10-CM | POA: Diagnosis not present

## 2020-03-20 DIAGNOSIS — I10 Essential (primary) hypertension: Secondary | ICD-10-CM | POA: Insufficient documentation

## 2020-03-20 DIAGNOSIS — F1094 Alcohol use, unspecified with alcohol-induced mood disorder: Secondary | ICD-10-CM

## 2020-03-20 DIAGNOSIS — F1020 Alcohol dependence, uncomplicated: Secondary | ICD-10-CM | POA: Diagnosis not present

## 2020-03-20 DIAGNOSIS — F1010 Alcohol abuse, uncomplicated: Secondary | ICD-10-CM

## 2020-03-20 DIAGNOSIS — J690 Pneumonitis due to inhalation of food and vomit: Secondary | ICD-10-CM | POA: Diagnosis not present

## 2020-03-20 DIAGNOSIS — E8729 Other acidosis: Secondary | ICD-10-CM

## 2020-03-20 DIAGNOSIS — R930 Abnormal findings on diagnostic imaging of skull and head, not elsewhere classified: Secondary | ICD-10-CM | POA: Diagnosis not present

## 2020-03-20 DIAGNOSIS — F10129 Alcohol abuse with intoxication, unspecified: Secondary | ICD-10-CM | POA: Diagnosis present

## 2020-03-20 HISTORY — DX: Alcohol abuse, uncomplicated: F10.10

## 2020-03-20 LAB — BASIC METABOLIC PANEL
Anion gap: 14 (ref 5–15)
BUN: 13 mg/dL (ref 8–23)
CO2: 18 mmol/L — ABNORMAL LOW (ref 22–32)
Calcium: 8.9 mg/dL (ref 8.9–10.3)
Chloride: 108 mmol/L (ref 98–111)
Creatinine, Ser: 1.08 mg/dL (ref 0.61–1.24)
GFR, Estimated: >60 mL/min (ref >60)
Glucose, Bld: 196 mg/dL — ABNORMAL HIGH (ref 70–99)
Potassium: 3.6 mmol/L (ref 3.5–5.1)
Sodium: 140 mmol/L (ref 135–145)

## 2020-03-20 LAB — COMPREHENSIVE METABOLIC PANEL
ALT: 37 U/L (ref 0–44)
AST: 79 U/L — ABNORMAL HIGH (ref 15–41)
Albumin: 4.2 g/dL (ref 3.5–5.0)
Alkaline Phosphatase: 136 U/L — ABNORMAL HIGH (ref 38–126)
Anion gap: 18 — ABNORMAL HIGH (ref 5–15)
BUN: 16 mg/dL (ref 8–23)
CO2: 14 mmol/L — ABNORMAL LOW (ref 22–32)
Calcium: 8.9 mg/dL (ref 8.9–10.3)
Chloride: 109 mmol/L (ref 98–111)
Creatinine, Ser: 1.11 mg/dL (ref 0.61–1.24)
GFR, Estimated: >60 mL/min (ref >60)
Glucose, Bld: 157 mg/dL — ABNORMAL HIGH (ref 70–99)
Potassium: 3.2 mmol/L — ABNORMAL LOW (ref 3.5–5.1)
Sodium: 141 mmol/L (ref 135–145)
Total Bilirubin: 0.9 mg/dL (ref 0.3–1.2)
Total Protein: 7.3 g/dL (ref 6.5–8.1)

## 2020-03-20 LAB — CBC WITH DIFFERENTIAL/PLATELET
Abs Immature Granulocytes: 0.07 10*3/uL (ref 0.00–0.07)
Basophils Absolute: 0.1 10*3/uL (ref 0.0–0.1)
Basophils Relative: 1 %
Eosinophils Absolute: 0.1 10*3/uL (ref 0.0–0.5)
Eosinophils Relative: 1 %
HCT: 50.2 % (ref 39.0–52.0)
Hemoglobin: 17 g/dL (ref 13.0–17.0)
Immature Granulocytes: 1 %
Lymphocytes Relative: 13 %
Lymphs Abs: 1.5 10*3/uL (ref 0.7–4.0)
MCH: 29 pg (ref 26.0–34.0)
MCHC: 33.9 g/dL (ref 30.0–36.0)
MCV: 85.5 fL (ref 80.0–100.0)
Monocytes Absolute: 0.9 10*3/uL (ref 0.1–1.0)
Monocytes Relative: 8 %
Neutro Abs: 8.8 10*3/uL — ABNORMAL HIGH (ref 1.7–7.7)
Neutrophils Relative %: 76 %
Platelets: 298 10*3/uL (ref 150–400)
RBC: 5.87 MIL/uL — ABNORMAL HIGH (ref 4.22–5.81)
RDW: 14 % (ref 11.5–15.5)
WBC: 11.4 10*3/uL — ABNORMAL HIGH (ref 4.0–10.5)
nRBC: 0 % (ref 0.0–0.2)

## 2020-03-20 LAB — URINE DRUG SCREEN, QUALITATIVE (ARMC ONLY)
Amphetamines, Ur Screen: NOT DETECTED
Barbiturates, Ur Screen: NOT DETECTED
Benzodiazepine, Ur Scrn: POSITIVE — AB
Cannabinoid 50 Ng, Ur ~~LOC~~: NOT DETECTED
Cocaine Metabolite,Ur ~~LOC~~: NOT DETECTED
MDMA (Ecstasy)Ur Screen: NOT DETECTED
Methadone Scn, Ur: NOT DETECTED
Opiate, Ur Screen: NOT DETECTED
Phencyclidine (PCP) Ur S: NOT DETECTED
Tricyclic, Ur Screen: POSITIVE — AB

## 2020-03-20 LAB — ETHANOL: Alcohol, Ethyl (B): 204 mg/dL — ABNORMAL HIGH (ref <10)

## 2020-03-20 LAB — ACETAMINOPHEN LEVEL: Acetaminophen (Tylenol), Serum: <10 ug/mL — ABNORMAL LOW (ref 10–30)

## 2020-03-20 LAB — MAGNESIUM: Magnesium: 2.1 mg/dL (ref 1.7–2.4)

## 2020-03-20 LAB — SALICYLATE LEVEL: Salicylate Lvl: <7 mg/dL — ABNORMAL LOW (ref 7.0–30.0)

## 2020-03-20 MED ORDER — IBUPROFEN 800 MG PO TABS
800.0000 mg | ORAL_TABLET | Freq: Once | ORAL | Status: AC
  Administered 2020-03-20: 800 mg via ORAL
  Filled 2020-03-20: qty 1

## 2020-03-20 MED ORDER — KCL-LACTATED RINGERS-D5W 20 MEQ/L IV SOLN
INTRAVENOUS | Status: DC
  Filled 2020-03-20 (×10): qty 1000

## 2020-03-20 MED ORDER — AMOXICILLIN-POT CLAVULANATE 875-125 MG PO TABS: 1.0000 | ORAL_TABLET | Freq: Two times a day (BID) | ORAL | 0 refills | Status: AC

## 2020-03-20 MED ORDER — AMOXICILLIN-POT CLAVULANATE 875-125 MG PO TABS
1.0000 | ORAL_TABLET | Freq: Two times a day (BID) | ORAL | Status: DC
  Administered 2020-03-20 – 2020-03-21 (×2): 1 via ORAL
  Filled 2020-03-20 (×2): qty 1

## 2020-03-20 MED ORDER — THIAMINE HCL 100 MG PO TABS
100.0000 mg | ORAL_TABLET | Freq: Once | ORAL | Status: AC
  Administered 2020-03-20: 100 mg via ORAL
  Filled 2020-03-20: qty 1

## 2020-03-20 MED ORDER — ADULT MULTIVITAMIN W/MINERALS CH
1.0000 | ORAL_TABLET | Freq: Once | ORAL | Status: AC
  Administered 2020-03-20: 1 via ORAL
  Filled 2020-03-20: qty 1

## 2020-03-20 NOTE — ED Notes (Signed)
Hourly rounding completed at this time, patient currently awake in room. No complaints, stable, and in no acute distress. Q15 minute rounds and monitoring via Rover and Officer to continue. °

## 2020-03-20 NOTE — ED Notes (Signed)
Pt c/o severe back pain. MD smith made aware and assessing patient.

## 2020-03-20 NOTE — ED Notes (Signed)
Pt assisted with repositioning in bed at this time

## 2020-03-20 NOTE — ED Notes (Signed)
Patient placed on stretcher for CT. CT made aware patient ready

## 2020-03-20 NOTE — ED Notes (Signed)
Report received from [employer] including Situation, Background, Assessment, and Recommendations. Patient alert and oriented, warm and dry, and in no acute distress. Patient denies SI, HI, AVH and pain. Patient made aware of Q15 minute rounds and Engineer, drilling presence for their safety. Patient instructed to come to this nurse with needs or concerns.

## 2020-03-20 NOTE — ED Notes (Signed)
Pt complains of back pain, MD notified. Order to follow.

## 2020-03-20 NOTE — ED Provider Notes (Signed)
[facility] Emergency Department Provider Note ____________________________________________   First MD Initiated Contact with Patient 03/20/20 1458     (approximate)  I have reviewed the triage vital signs and the nursing notes.  HISTORY  Chief Complaint Alcohol Problem and Altered Mental Status   HPI Omar Davis is a 69 y.o. malewho presents to the ED for evaluation of acute intoxication  Chart review indicates patient is well-known to this facility for alcoholism and associated frequent visits and states of acute intoxication, belligerent and verbal abuse. Patient just recently presented to the ED 2 days ago and was observed overnight, sobered up and had no indications for emergent psychiatric concerns and was discharged home yesterday morning.  EMS was called to local motel today due to patient being acutely intoxicated and acting belligerent.  He was found slumped over in an elevator.  Brought in by EMS, noted to have oxygen saturations in the high 80s upon arrival.  Patient is acutely intoxicated and cannot provide significant history.  He denies pain at this time, denies assault denies suicidality.    Past Medical History:  Diagnosis Date  . Alcohol abuse   . Chronic pain   . Depressed   . Hypertension   . PTSD (post-traumatic stress disorder)   . TBI (traumatic brain injury) [facility])     Patient Active Problem List   Diagnosis Date Noted  . Iron deficiency anemia 04/08/2019  . Alcohol intoxication with delirium ([place])   . Sepsis ([place]) 02/08/2019  . Essential hypertension 02/05/2019  . Alcohol-induced mood disorder ([place]) 01/24/2019  . Alcohol dependence with intoxication, uncomplicated ([place]) 34/19/6222  . 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 Suicidal ideation 09/25/2015  . Alcohol abuse 09/09/2015  . PTSD (post-traumatic stress disorder) 09/09/2015  . Agitation 09/09/2015  .  Depressed   . Chronic pain     Past Surgical History:  Procedure Laterality Date  . BACK SURGERY    . HERNIA REPAIR    . KNEE SURGERY    . KNEE SURGERY    . prostectomy N/A     Prior to Admission medications   Medication Sig Start Date End Date Taking? Authorizing Provider  amoxicillin-clavulanate (AUGMENTIN) 875-125 MG tablet Take 1 tablet by mouth every 12 (twelve) hours for 5 days. 03/20/20 03/25/20  Vladimir Crofts, MD  DULoxetine (CYMBALTA) 30 MG capsule Take 1 capsule (30 mg total) by mouth daily. Patient not taking: Reported on 02/18/2020 02/07/19   Clapacs, Madie Reno, MD  ibuprofen (ADVIL) 600 MG tablet Take 1 tablet (600 mg total) by mouth every 8 (eight) hours as needed. Patient not taking: Reported on 02/18/2020 11/18/19   Sable Feil, PA-C  naproxen (NAPROSYN) 500 MG tablet Take 1 tablet (500 mg total) by mouth 2 (two) times daily as needed for moderate pain. Patient not taking: Reported on 02/18/2020 02/07/19   Clapacs, Madie Reno, MD  pravastatin (PRAVACHOL) 80 MG tablet Take 1 tablet (80 mg total) by mouth at bedtime. Patient not taking: Reported on 08/08/2019 02/07/19   Clapacs, Madie Reno, MD  traMADol (ULTRAM) 50 MG tablet Take 1 tablet (50 mg total) by mouth every 12 (twelve) hours as needed. Patient not taking: Reported on 03/19/2020 11/18/19   Sable Feil, PA-C    Allergies Codeine, Gabapentin, Lisinopril, and Penicillins  History reviewed. No pertinent family history.  Social History Social History   Tobacco Use  . Smoking status: Never Smoker  . Smokeless tobacco: Never Used  Vaping Use  . Vaping Use: Unknown  Substance Use Topics  . Alcohol use: Yes  . Drug use: Not Currently    Review of Systems  Unable to be accurately assessed due to patient's acutely intoxicated status ____________________________________________   PHYSICAL EXAM:  VITAL SIGNS: Vitals:   03/20/20 1537 03/20/20 1915  BP: 117/70 129/88  Pulse: (!) 107 (!) 103  Resp: 18 20  Temp:  99.3 F (37.4 C) 98.3 F (36.8 C)  SpO2: 94% 98%      Constitutional: Somnolent and clinically intoxicated.  Follows commands in all 4 extremities.  Slurred speech. Eyes: Conjunctivae are normal. PERRL. EOMI. Head: Atraumatic. Nose: No congestion/rhinnorhea. Mouth/Throat: Mucous membranes are dry.  Oropharynx non-erythematous. Neck: No stridor. No cervical spine tenderness to palpation.  No nuchal rigidity. Cardiovascular: Tachycardic rate, regular rhythm. Grossly normal heart sounds.  Good peripheral circulation. Respiratory: Normal respiratory effort.  No retractions. Lungs CTAB. Gastrointestinal: Soft , nondistended, nontender to palpation. No abdominal bruits. No CVA tenderness. Musculoskeletal: No lower extremity tenderness nor edema.  No joint effusions. No signs of acute trauma. Removed clothing and palpated all 4 extremities without deformity, signs of trauma or tenderness. No signs of trauma to the back, no spinal step-offs.  Vague midline lumbar tenderness to palpation across multiple levels without bony step-offs or overlying skin changes. Neurologic:   No gross focal neurologic deficits are appreciated.  Clinically intoxicated. Skin:  Skin is warm, dry and intact. No rash noted. Psychiatric: Mood and affect are normal. Speech and behavior are normal.  ____________________________________________   LABS (all labs ordered are listed, but only abnormal results are displayed)  Labs Reviewed  COMPREHENSIVE METABOLIC PANEL - Abnormal; Notable for the following components:      Result Value   Potassium 3.2 (*)    CO2 14 (*)    Glucose, Bld 157 (*)    AST 79 (*)    Alkaline Phosphatase 136 (*)    Anion gap 18 (*)    All other components within normal limits  ETHANOL - Abnormal; Notable for the following components:   Alcohol, Ethyl (B) 204 (*)    All other components within normal limits  URINE DRUG SCREEN, QUALITATIVE ([facility] ONLY) - Abnormal; Notable for the following  components:   Tricyclic, Ur Screen POSITIVE (*)    Benzodiazepine, Ur Scrn POSITIVE (*)    All other components within normal limits  CBC WITH DIFFERENTIAL/PLATELET - Abnormal; Notable for the following components:   WBC 11.4 (*)    RBC 5.87 (*)    Neutro Abs 8.8 (*)    All other components within normal limits  ACETAMINOPHEN LEVEL - Abnormal; Notable for the following components:   Acetaminophen (Tylenol), Serum <10 (*)    All other components within normal limits  SALICYLATE LEVEL - Abnormal; Notable for the following components:   Salicylate Lvl <2.5 (*)    All other components within normal limits  BASIC METABOLIC PANEL - Abnormal; Notable for the following components:   CO2 18 (*)    Glucose, Bld 196 (*)    All other components within normal limits  MAGNESIUM   ____________________________________________  12 Lead EKG Sinus rhythm, rate of 108 bpm.  Left axis.  Normal intervals.  No evidence of acute ischemia.  QTc 474 ms.  ____________________________________________  RADIOLOGY  ED MD interpretation: CT head reviewed by me without evidence of acute intracranial pathology.  Official radiology report(s): CT Head Wo Contrast  Result Date: 03/20/2020 CLINICAL DATA:  Mental status change  EXAM: CT HEAD WITHOUT CONTRAST TECHNIQUE: Contiguous axial images were obtained from the base of the skull through the vertex without intravenous contrast. COMPARISON:  02/18/2020 FINDINGS: Brain: There is no acute intracranial hemorrhage, mass effect, or edema. Gray-white differentiation is preserved. There is no extra-axial fluid collection. Ventricles and sulci are stable in size and configuration. Patchy hypoattenuation in the supratentorial white matter likely reflects stable chronic microvascular ischemic changes. Vascular: There is atherosclerotic calcification at the skull base. Skull: Right craniotomy.  No new abnormality. Sinuses/Orbits: No acute finding. Other: None. IMPRESSION: No  acute intracranial abnormality. Electronically Signed   By: Macy Mis M.D.   On: 03/20/2020 16:38   CT Lumbar Spine Wo Contrast  Result Date: 03/20/2020 CLINICAL DATA:  Low back pain after fall, concern for fracture. EXAM: CT LUMBAR SPINE WITHOUT CONTRAST TECHNIQUE: Multidetector CT imaging of the lumbar spine was performed without intravenous contrast administration. Multiplanar CT image reconstructions were also generated. COMPARISON:  MRI 12/15/2019, radiograph 11/18/2019 FINDINGS: Segmentation: 5 normally formed lumbar type vertebral levels. Lowest fully formed disc space denoted as L5-S1. Alignment: Levocurvature the lumbar spine apex L2-L3. Slight 2 mm anterolisthesis T11 on 12. Mild stepwise retrolisthesis T12-L2 favored to be on a degenerative basis without spondylolysis. No abnormally widened, perched or jumped facets. Vertebrae: No clear acute fracture is identified. Subacute appearing nondisplaced fracture of the right L1 transverse process given some mild callus formation. Favor remote anterior wedging deformity at the T12 level with up to 40% height loss anteriorly without a clear fracture line or surrounding swelling to suggest acuity. No other significant vertebral body height loss. Arthrosis of the bilateral SI joints. Indeterminate sclerotic and lucent lesion in the left SI joint. Paraspinal and other soft tissues: Lobular soft tissue attenuation (50 HU) structure extending through the right neural foramen at L3-4 with some associated smooth associated bony remodeling and widening of the neural foramen towards the extraforaminal zone. This measures approximately 3.3 x 3.7 x 4.9 cm though accurate measurement is difficult to fully ascertain given lack of contrast media (5/69, 7/26). 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 No associated stranding or inflammatory features are evident. No other paravertebral fluid, swelling or gas.  The included portions of posterior abdomen and pelvis reveal aortoiliac atherosclerosis. Disc levels: Level by level evaluation of the lumbar spine below: T11-T12: 2 mm anterolisthesis. Moderate disc height loss. No significant posterior disc abnormality. Moderate bilateral facet arthropathy. No significant canal stenosis. Mild bilateral foraminal narrowing. T12-L1: Severe disc height loss and posterior ridging with central-right central disc protrusion and spur inferiorly directed. Moderate bilateral facet arthropathy. Mild-to-moderate spinal stenosis and bilateral foraminal narrowing. L1-L2: Severe disc height loss, posterior ridging and inferiorly directed spurring centrally with moderate bilateral facet osteoarthropathy. Moderate stenosis and bilateral foraminal narrowing. L2-L3: Moderate disc height loss, global disc bulge and moderate bilateral facet arthropathy. Disc height loss is asymmetric to the right with at most mild canal stenosis and moderate right and mild left foraminal narrowing with right lateral recess stenosis. L3-L4: Disc degeneration and asymmetric right sided disc height loss with posterior ridging and global disc bulge. Moderate bilateral facet arthropathy. Right perineal cyst, as described above. Mild-to-moderate left foraminal narrowing and mild canal stenosis as well. L4-L5: Asymmetric disc height loss more pronounced towards the left with global disc bulge asymmetric to the left extraforaminal zone with bulky ridging. Prior laminotomy. No canal stenosis. Severe left and moderate right foraminal narrowing with bilateral effacement of the lateral recesses.  L5-S1: Moderate disc height loss and moderate to severe bilateral foraminal narrowing with posterior decompressive changes. No spinal canal stenosis but with moderate bilateral foraminal narrowing and lateral recess stenosis. IMPRESSION: 1. Subacute appearing nondisplaced fracture of the right L1 transverse process given some mild callus  formation. 2. No acute fracture is evident. 3. Remote wedging deformity at the T12 level with up to 40% height loss anteriorly. 4. Levocurvature the lumbar spine apex L2-L3. Mild anterolisthesis T11-12 and stepwise retrolisthesis T12-L2 favored to be on a degenerative basis without spondylolysis. 5. Lobular soft tissue attenuation structure extending through the right neural foramen at L3-4 with some associated smooth associated bony remodeling and widening of the neural foramen towards the extraforaminal zone. 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 6. Multilevel degenerative changes of the lumbar spine as described level by level above including multilevel moderate canal stenoses and moderate to severe foraminal narrowing 7. Indeterminate sclerotic and lucent lesion in the left SI joint. No particularly aggressive features. 8. Aortic Atherosclerosis (ICD10-I70.0). Electronically Signed   By: Lovena Le M.D.   On: 03/20/2020 17:58   DG Chest Portable 1 View  Result Date: 03/20/2020 CLINICAL DATA:  Shrunken hypoxic. Evaluate for infiltrates or signs of aspiration. EXAM: PORTABLE CHEST 1 VIEW COMPARISON:  02/18/2020. FINDINGS: The heart size and mediastinal contours are within normal limits. Aortic atherosclerosis. Low lung volumes with subtle opacities at the left lung base. No visible pleural effusions or pneumothorax on this limited portable semi supine study. Severe bilateral shoulder degenerative change. No acute osseous abnormality. IMPRESSION: Low lung volumes with subtle opacities at the left lung base, possibly early pneumonia or aspiration. Electronically Signed   By: Margaretha Sheffield MD   On: 03/20/2020 15:55    ____________________________________________   PROCEDURES and INTERVENTIONS  Procedure(s) performed (including Critical Care):  Procedures  Medications  dextrose 5% in lactated ringers with KCl 20 mEq/L  infusion ( Intravenous New Bag/Given 03/20/20 1801)  amoxicillin-clavulanate (AUGMENTIN) 875-125 MG per tablet 1 tablet (1 tablet Oral Given 03/20/20 1956)  thiamine tablet 100 mg (100 mg Oral Given 03/20/20 1955)  multivitamin with minerals tablet 1 tablet (1 tablet Oral Given 03/20/20 1955)  ibuprofen (ADVIL) tablet 800 mg (800 mg Oral Given 03/20/20 2345)    ____________________________________________   MDM / ED COURSE  69 year old alcoholic well-known to our ED presents about 24 hours since last discharge, with evidence of alcoholic ketoacidosis and aspiration pneumonia requiring antibiotic treatment, and subsequently amenable to psychiatric consultation and disposition.  Patient with slightly increased temperature to 99.7, otherwise normal vital signs on room air.  Exam initially with a somnolent patient who is acutely intoxicated without evidence of distress or neurovascular deficits.  Blood work with slight leukocytosis, CXR with new streaky opacities to his left base he does report a new cough to me.  Overall is concerning for aspiration pneumonitis and possibly pneumonia.  For this, I start the patient on a course of Augmentin.  Blood work demonstrating evidence of mild alcoholic ketoacidosis.  Provided infusion of D5 LR with K over few hours and recheck his metabolic panel with improvement of these features.  Clinically he is drastically improved and looks well.  He continues to be voluntary and have no indications to IVC the patient this time for any psychiatric emergencies.  We will hold him in the ED for evaluation by TTS and psychiatry.  Clinical Course as of Mar 21 24  Fri Mar 20, 2020  1519 Reposition patient in bed and his oxygen saturations improved to 91-92% on room air.   [DS]  1519 Dr. Weber Cooks is already in the ED to see the patient, he steps in and peripherally evaluates the patient.  Indicates that patient is more likely to be placed psychiatrically   [DS]  1520  and  final disposition if he is not IVC.  We have no current indications to IVC the patient this time.  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   [DS]  1634 Patient returning from CT and RN grabs me that he is more awake now complaining of back pain.  I immediately reevaluate the patient, logroll him and noticed midline lumbar spine tenderness with an overlying surgical scar.  He cannot tell me if he fell or if he hit his lower back due to his continued acute toxic agent.  We will return to CT to image this   [DS]  [place].  Patient sitting up in bed and looking much improved from presentation.  D5 LR with K infusing.  Patient has tolerated some p.o. intake, but reports difficulty with solids because he has no teeth and no dentures.  We will give him some applesauce.  He does report he has had increased cough for the past few days, but denies fevers or known aspiration events.  I educate him of the diagnosis of pneumonia or at least aspiration pneumonitis, and plan to treat with antibiotics.  He is agreeable.   [DS]  1920 Educated nighttime nurse on plan for continued IV fluids and recheck of BMP in about 1 hour.  He is clinically improved and close to being medically cleared for psychiatry, just need improved metabolic acidosis   [DS]  2231 The patient has been placed in psychiatric observation due to the need to provide a safe environment for the patient while obtaining psychiatric consultation and evaluation, as well as ongoing medical and medication management to treat the patient's condition. The patient has not been placed under full IVC at this time.     [DS]    Clinical Course User Index [DS] Vladimir Crofts, MD     ____________________________________________   FINAL CLINICAL IMPRESSION(S) / ED DIAGNOSES  Final diagnoses:  Alcoholic  ketoacidosis  Dehydration  Alcohol abuse  Aspiration pneumonia of left lower lobe due to vomit [facility])     ED Discharge Orders         Ordered    amoxicillin-clavulanate (AUGMENTIN) 875-125 MG tablet  Every 12 hours        03/20/20 1915           Amanee Iacovelli Tamala Julian   Note:  This document was prepared using Dragon voice recognition software and may include unintentional dictation errors.   Vladimir Crofts, MD 03/21/20 [phone]

## 2020-03-20 NOTE — ED Triage Notes (Signed)
Patient arrived by EMS from hotel. Patient was found curled up in a ball on elevator. EMS was called with c/o fall. Per EMS patient was combative towards them and given 4mg  Versed and 5mg  Haldol. Patient alert to name when called briefly upon arrival to ER. Patient saturated in stool and urine upon arrival. Pt cleaned and placed new brief on and hospital scrubs.

## 2020-03-20 NOTE — ED Notes (Signed)
Patient transported to CT 

## 2020-03-21 DIAGNOSIS — F1020 Alcohol dependence, uncomplicated: Secondary | ICD-10-CM

## 2020-03-21 MED ORDER — AMOXICILLIN-POT CLAVULANATE 875-125 MG PO TABS: 1.0000 | ORAL_TABLET | Freq: Two times a day (BID) | ORAL | 0 refills | Status: AC

## 2020-03-21 MED ORDER — IBUPROFEN 800 MG PO TABS
800.0000 mg | ORAL_TABLET | Freq: Once | ORAL | Status: AC
  Administered 2020-03-21: 800 mg via ORAL
  Filled 2020-03-21: qty 1

## 2020-03-21 MED ORDER — ACETAMINOPHEN 500 MG PO TABS
1000.0000 mg | ORAL_TABLET | Freq: Once | ORAL | Status: AC
  Administered 2020-03-21: 1000 mg via ORAL

## 2020-03-21 MED ORDER — LORAZEPAM 2 MG PO TABS
2.0000 mg | ORAL_TABLET | Freq: Once | ORAL | Status: AC
  Administered 2020-03-21: 2 mg via ORAL
  Filled 2020-03-21: qty 1

## 2020-03-21 NOTE — ED Notes (Signed)
Pt assisted to bathroom

## 2020-03-21 NOTE — ED Notes (Signed)
Pt awake, complaining of returned back pain, MD notified.

## 2020-03-21 NOTE — ED Provider Notes (Signed)
-----------------------------------------   12:53 PM on 03/21/2020 -----------------------------------------  The patient has been seen and evaluated by psychiatry this morning.  Patient is asking when he can go home.  Patient will be discharged from the psychiatric service.  No concerning findings on his medical work-up besides an elevated alcohol level and subacute fracture of L1.Harvest Dark, MD 03/21/20 1254

## 2020-03-21 NOTE — BH Assessment (Signed)
Assessment Note  Omar Davis is an 69 y.o. male. Per triage note: Patient arrived by EMS from hotel. Patient was found curled up in a ball on elevator. EMS was called with c/o fall. Per EMS patient was combative towards them and given 4mg  Versed and 5mg  Haldol. Patient alert to name when called briefly upon arrival to ER.  Patient saturated in stool and urine upon arrival. Pt cleaned and placed new brief on and hospital scrubs.   Pt presented with a disheveled appearance and was asleep upon this writer's arrival. Pt spoke in a soft tone, low volume, and normal pace. Patient's thought process was coherent and relevant during the assessment. Eye contact was good. Pt's mood was apathetic, affect is congruent with mood. Patient reported that he'd just broken up with his girlfriend 2 days ago. Pt admits to symptoms of depression. Pt identified his loss of his girlfriend as a source of his stress. Pt was vague and struggled to elaborate on responses. Pt explained that his drinking behavior had not been exacerbated by his breakup and reported that he just likes to drink. Pt reported that he lives in a motel and he is technically homeless. Pt reported that he is not connected to a psychiatrist or therapist and he has no psych history. Pt denied any abuse hx. Pt denies current suicidality HI, AV/hallucinations, or symptoms of paranoia.   Diagnosis: 303.9 Alcohol use disorder, severe  Past Medical History:  Past Medical History:  Diagnosis Date  . Alcohol abuse   . Chronic pain   . Depressed   . Hypertension   . PTSD (post-traumatic stress disorder)   . TBI (traumatic brain injury) [facility])     Past Surgical History:  Procedure Laterality Date  . BACK SURGERY    . HERNIA REPAIR    . KNEE SURGERY    . KNEE SURGERY    . prostectomy N/A     Family History: History reviewed. No pertinent family history.  Social History:  reports that he has never smoked. He has never used smokeless tobacco. He  reports current alcohol use. He reports previous drug use.  Additional Social History:  Alcohol / Drug Use Pain Medications: See PTA Prescriptions: See PTA History of alcohol / drug use?: Yes Negative Consequences of Use: Personal relationships Substance #1 Name of Substance 1: Alcohol 1 - Last Use / Amount: 03/20/20  CIWA: CIWA-Ar BP: 129/88 Pulse Rate: (!) 103 COWS:    Allergies:  Allergies  Allergen Reactions  . Codeine Hives and Rash  . Gabapentin Other (See Comments) and Anaphylaxis    seizures Other reaction(s): Other (See Comments) seizures Anaphylactic Shock   . Lisinopril Swelling    Lip Swelling   . Penicillins Rash    Did it involve swelling of the face/tongue/throat, SOB, or low BP? No Did it involve sudden or severe rash/hives, skin peeling, or any reaction on the inside of your mouth or nose? Yes Did you need to seek medical attention at a hospital or doctor's office? No When did it last happen? If all above answers are "NO", may proceed with cephalosporin use.     Home Medications: (Not in a hospital admission)   OB/GYN Status:  No LMP for male patient.  General Assessment Data Location of Assessment: [facility] ED TTS Assessment: In system Is this a Tele or Face-to-Face Assessment?: Face-to-Face Is this an Initial Assessment or a Re-assessment for this encounter?: Initial Assessment Patient Accompanied by:: N/A Language Other than English: No Living  Arrangements: Homeless/Shelter What gender do you identify as?: Male Date Telepsych consult ordered in [facility]: 03/20/20 Time Telepsych consult ordered in [facility]: 2232 Marital status: Single Maiden name: n/a Pregnancy Status: No Living Arrangements: Alone Can pt return to current living arrangement?: Yes Admission Status: Voluntary Is patient capable of signing voluntary admission?: Yes Referral Source: Self/Family/Friend Insurance type: Best boy Exam ([place]) Medical Exam completed: Yes  Crisis Care Plan Living Arrangements: Alone Legal Guardian: Other: (Self) Name of Psychiatrist: None Name of Therapist: None  Education Status Is patient currently in school?: No Is the patient employed, unemployed or receiving disability?: Receiving disability income  Risk to self with the past 6 months Suicidal Ideation: No Has patient been a risk to self within the past 6 months prior to admission? : No Suicidal Intent: No Has patient had any suicidal intent within the past 6 months prior to admission? : No Is patient at risk for suicide?: Yes Suicidal Plan?: No Has patient had any suicidal plan within the past 6 months prior to admission? : No Access to Means: No What has been your use of drugs/alcohol within the last 12 months?: Alcohol Previous Attempts/Gestures: No How many times?: 0 Other Self Harm Risks: n/a Triggers for Past Attempts: None known Intentional Self Injurious Behavior: None Family Suicide History: Unknown Recent stressful life event(s): Loss (Comment), Conflict (Comment) (Pt reports he just lost his girlfriend) Persecutory voices/beliefs?: No Depression: Yes Depression Symptoms: Feeling angry/irritable, Feeling worthless/self pity Substance abuse history and/or treatment for substance abuse?: Yes Suicide prevention information given to non-admitted patients: Not applicable  Risk to Others within the past 6 months Homicidal Ideation: No Does patient have any lifetime risk of violence toward others beyond the six months prior to admission? : No Thoughts of Harm to Others: No Current Homicidal Intent: No Current Homicidal Plan: No Access to Homicidal Means: No Identified Victim: n/a History of harm to others?: No Assessment of Violence: None Noted Violent Behavior Description: N/a Does patient have access to weapons?: No Criminal Charges Pending?: No Does patient have a court date: No Is patient on probation?:  No  Psychosis Hallucinations: None noted Delusions: None noted  Mental Status Report Appearance/Hygiene: Poor hygiene Eye Contact: Fair Motor Activity: Tremors Speech: Logical/coherent Level of Consciousness: Quiet/awake Mood: Ambivalent Affect: Appropriate to circumstance Anxiety Level: Minimal Thought Processes: Relevant, Coherent Judgement: Impaired Orientation: Person, Place, Time, Situation Obsessive Compulsive Thoughts/Behaviors: Unable to Assess  Cognitive Functioning Concentration: Good Memory: Unable to Assess Is patient IDD: No Insight: Poor Impulse Control: Poor Appetite: Fair Have you had any weight changes? : No Change Sleep: Decreased Total Hours of Sleep: 4 Vegetative Symptoms: None  ADLScreening [facility] Assessment Services) Patient's cognitive ability adequate to safely complete daily activities?: Yes Patient able to express need for assistance with ADLs?: Yes Independently performs ADLs?: Yes (appropriate for developmental age)  Prior Inpatient Therapy Prior Inpatient Therapy: No  Prior Outpatient Therapy Prior Outpatient Therapy: No Does patient have an ACCT team?: No Does patient have Intensive In-House Services?  : No Does patient have [facility] services? : No Does patient have P4CC services?: No  ADL Screening (condition at time of admission) Patient's cognitive ability adequate to safely complete daily activities?: Yes Is the patient deaf or have difficulty hearing?: No Does the patient have difficulty seeing, even when wearing glasses/contacts?: No Does the patient have difficulty concentrating, remembering, or making decisions?: No Patient able to express need for assistance with ADLs?: Yes Does the patient have  difficulty dressing or bathing?: No Independently performs ADLs?: Yes (appropriate for developmental age) Does the patient have difficulty walking or climbing stairs?: No Weakness of Legs: None Weakness of Arms/Hands: None  Home  Assistive Devices/Equipment Home Assistive Devices/Equipment: None  Therapy Consults (therapy consults require a physician order) PT Evaluation Needed: No OT Evalulation Needed: No SLP Evaluation Needed: No Abuse/Neglect Assessment (Assessment to be complete while patient is alone) Abuse/Neglect Assessment Can Be Completed: Yes Physical Abuse: Denies Verbal Abuse: Denies Sexual Abuse: Denies Exploitation of patient/patient's resources: Denies Self-Neglect: Denies Values / Beliefs Cultural Requests During Hospitalization: None Spiritual Requests During Hospitalization: None Consults Spiritual Care Consult Needed: No Transition of Care Team Consult Needed: No Advance Directives (For Healthcare) Does Patient Have a Medical Advance Directive?: No Would patient like information on creating a medical advance directive?: No - Patient declined          Disposition: Per psych NP Ysidro Evert pt is recommended for overnight observation and reassessment in the AM.  Disposition Initial Assessment Completed for this Encounter: Yes  On Site Evaluation by:   Reviewed with Physician:    Kathi Ludwig 03/21/2020 3:19 AM

## 2020-03-21 NOTE — ED Notes (Signed)
Taxi called to take pt home

## 2020-03-21 NOTE — ED Notes (Signed)
Hourly rounding completed at this time, patient currently asleep in hallway bed. No complaints, stable, and in no acute distress. Q15 minute rounds and monitoring via Rover and Officer to continue. 

## 2020-03-21 NOTE — ED Notes (Signed)
Pt alert and calm. Still has IV in arm.

## 2020-03-21 NOTE — ED Notes (Signed)
Psych and TTS at bedside. 

## 2020-03-21 NOTE — Consult Note (Signed)
[facility] Psych ED Discharge  03/21/2020 1:04 PM Darius Lundberg  MRN:  [id number] Principal Problem: Alcohol use d/o Discharge Diagnoses: Active Problems:   Alcohol use disorder, severe, dependence (HCC)  Subjective: "I'm ready to go."  Patient seen and evaluated in person by this provider and TTS.  He was brought in after being found intoxicated.  He is now clear and coherent, requesting to leave and return to his hotel.  Denies suicidal/homicidal ideations, hallucinations, or withdrawal symptoms.  Recommended alcohol rehab, client declined.  Services through the [place] in [place], declines at this time.  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  Pain relief decreases his drinking.  High level of consumption.  Unfortunately, he returns to alcohol use and refuses detox or rehab.  Psychiatrically cleared at this time.  Total Time spent with patient: 45 minutes  Past Psychiatric History: PTSD, alcohol use d/o, depressed  Past Medical History:  Past Medical History:  Diagnosis Date  . Alcohol abuse   . Chronic pain   . Depressed   . Hypertension   . PTSD (post-traumatic stress disorder)   . TBI (traumatic brain injury) [facility])     Past Surgical History:  Procedure Laterality Date  . BACK SURGERY    . HERNIA REPAIR    . KNEE SURGERY    . KNEE SURGERY    . prostectomy N/A    Family History: History reviewed. No pertinent family history. Family Psychiatric  History: none Social History:  Social History   Substance and Sexual Activity  Alcohol Use Yes     Social History   Substance and Sexual Activity  Drug Use Not Currently    Social History   Socioeconomic History  . Marital status: Legally Separated    Spouse name: Not on file  . Number of children: Not on file  . Years of education: Not on file  . Highest education level: Not on file  Occupational History  . Not on file  Tobacco Use  . Smoking status: Never  Smoker  . Smokeless tobacco: Never Used  Vaping Use  . Vaping Use: Unknown  Substance and Sexual Activity  . Alcohol use: Yes  . Drug use: Not Currently  . Sexual activity: Not Currently  Other Topics Concern  . Not on file  Social History Narrative  . Not on file   Social Determinants of Health   Financial Resource Strain:   . Difficulty of Paying Living Expenses: Not on file  Food Insecurity:   . Worried About Charity fundraiser in the Last Year: Not on file  . Ran Out of Food in the Last Year: Not on file  Transportation Needs:   . Lack of Transportation (Medical): Not on file  . Lack of Transportation (Non-Medical): Not on file  Physical Activity:   . Days of Exercise per Week: Not on file  . Minutes of Exercise per Session: Not on file  Stress:   . Feeling of Stress : Not on file  Social Connections:   . Frequency of Communication with Friends and Family: Not on file  . Frequency of Social Gatherings with Friends and Family: Not on file  . Attends Religious Services: Not on file  . Active Member of Clubs or Organizations: Not on file  . Attends Archivist Meetings: Not on file  . Marital Status: Not on file    Has this patient used any form of tobacco  in the last 30 days? (Cigarettes, Smokeless Tobacco, Cigars, and/or Pipes) A prescription for an FDA-approved tobacco cessation medication was offered at discharge and the patient refused  Current Medications: Current Facility-Administered Medications  Medication Dose Route Frequency Provider Last Rate Last Admin  . amoxicillin-clavulanate (AUGMENTIN) 875-125 MG per tablet 1 tablet  1 tablet Oral Q12H Vladimir Crofts, MD   1 tablet at 03/21/20 0919  . dextrose 5% in lactated ringers with KCl 20 mEq/L infusion   Intravenous Continuous Vladimir Crofts, MD   Stopped at 03/20/20 2150   Current Outpatient Medications  Medication Sig Dispense Refill  . amoxicillin-clavulanate (AUGMENTIN) 875-125 MG tablet Take 1 tablet  by mouth every 12 (twelve) hours for 5 days. 10 tablet 0  . DULoxetine (CYMBALTA) 30 MG capsule Take 1 capsule (30 mg total) by mouth daily. (Patient not taking: Reported on 02/18/2020) 30 capsule 0  . ibuprofen (ADVIL) 600 MG tablet Take 1 tablet (600 mg total) by mouth every 8 (eight) hours as needed. (Patient not taking: Reported on 02/18/2020) 15 tablet 0  . naproxen (NAPROSYN) 500 MG tablet Take 1 tablet (500 mg total) by mouth 2 (two) times daily as needed for moderate pain. (Patient not taking: Reported on 02/18/2020) 60 tablet 0  . pravastatin (PRAVACHOL) 80 MG tablet Take 1 tablet (80 mg total) by mouth at bedtime. (Patient not taking: Reported on 08/08/2019) 30 tablet 0  . traMADol (ULTRAM) 50 MG tablet Take 1 tablet (50 mg total) by mouth every 12 (twelve) hours as needed. (Patient not taking: Reported on 03/19/2020) 12 tablet 0   PTA Medications: (Not in a hospital admission)   Musculoskeletal: Strength & Muscle Tone: within normal limits Gait & Station: normal Patient leans: N/A  Psychiatric Specialty Exam: Physical Exam Vitals and nursing note reviewed.  Constitutional:      Appearance: Normal appearance.  HENT:     Head: Normocephalic.     Nose: Nose normal.  Pulmonary:     Effort: Pulmonary effort is normal.  Musculoskeletal:        General: Normal range of motion.     Cervical back: Normal range of motion.  Neurological:     General: No focal deficit present.     Mental Status: He is alert and oriented to person, place, and time.  Psychiatric:        Attention and Perception: Attention and perception normal.        Mood and Affect: Affect normal. Mood is anxious.        Speech: Speech normal.        Behavior: Behavior normal. Behavior is cooperative.        Thought Content: Thought content normal.        Cognition and Memory: Cognition and memory normal.        Judgment: Judgment normal.     Review of Systems  Psychiatric/Behavioral: The patient is  nervous/anxious.   All other systems reviewed and are negative.   Blood pressure (!) 167/91, pulse (!) 103, temperature 98.7 F (37.1 C), temperature source Oral, resp. rate 18, height 5\' 8"  (1.727 m), weight 86 kg, SpO2 93 %.Body mass index is 28.83 kg/m.  General Appearance: Casual  Eye Contact:  Good  Speech:  Normal Rate  Volume:  Normal  Mood:  Depressed  Affect:  Congruent  Thought Process:  Coherent and Descriptions of Associations: Intact  Orientation:  Full (Time, Place, and Person)  Thought Content:  WDL and Logical  Suicidal Thoughts:  No  Homicidal Thoughts:  No  Memory:  Immediate;   Fair Recent;   Fair Remote;   Fair  Judgement:  Fair  Insight:  Fair  Psychomotor Activity:  Decreased  Concentration:  Concentration: Fair and Attention Span: Fair  Recall:  [employer] of Knowledge:  Fair  Language:  Good  Akathisia:  No  Handed:  Right  AIMS (if indicated):     Assets:  Leisure Time Resilience Social Support  ADL's:  Intact  Cognition:  WNL  Sleep:        Demographic Factors:  Male, Age 71 or older, Caucasian and Living alone  Loss Factors: NA  Historical Factors: Impulsivity  Risk Reduction Factors:   Sense of responsibility to family and Positive social support  Continued Clinical Symptoms:  Anxiety, mild  Cognitive Features That Contribute To Risk:  None    Suicide Risk:  Minimal: No identifiable suicidal ideation.  Patients presenting with no risk factors but with morbid ruminations; may be classified as minimal risk based on the severity of the depressive symptoms   Follow-up Information    Schedule an appointment as soon as possible for a visit  with [facility] [place] information: [place] [address] [phone]               Plan Of Care/Follow-up recommendations:  Alcohol use d/o, severe: -Refrain from alcohol and drug use -Attend AA with a sponsor -[facility] Activity:  as tolerated Diet:  heart healthy diet  Disposition: discharge home Waylan Boga, NP 03/21/2020, 1:04 PM

## 2020-03-21 NOTE — Discharge Instructions (Addendum)
You have been seen in the emergency department for a  psychiatric concern. You have been evaluated both medically as well as psychiatrically. Please follow-up with your outpatient resources provided. Return to the emergency department for any worsening symptoms, or any thoughts of hurting yourself or anyone else so that we may attempt to help you.  [facility]    Alcohol Abuse and Dependence Information, Adult Alcohol is a widely available drug. People drink alcohol in different amounts. People who drink alcohol very often and in large amounts often have problems during and after drinking. They may develop what is called an alcohol use disorder. There are two main types of alcohol use disorders:  Alcohol abuse. This is when you use alcohol too much or too often. You may use alcohol to make yourself feel happy or to reduce stress. You may have a hard time setting a limit on the amount you drink.  Alcohol dependence. This is when you use alcohol consistently for a period of time, and your body changes as a result. This can make it hard to stop drinking because you may start to feel sick or feel different when you do not use alcohol. These symptoms are known as withdrawal. How can alcohol abuse and dependence affect me? Alcohol abuse and dependence can have a negative effect on your life. Drinking too much can lead to addiction. You may feel like you need alcohol to function normally. You may drink alcohol before work in the morning, during the day, or as soon as you get home from work in the evening. These actions can result in:  Poor work performance.  Job loss.  Financial problems.  Car crashes or criminal charges from driving after drinking alcohol.  Problems in your relationships with friends and family.  Losing the trust and respect of coworkers, friends, and family. Drinking heavily over a long period of time can permanently damage your body and brain, and can cause lifelong  health issues, such as:  Damage to your liver or pancreas.  Heart problems, high blood pressure, or stroke.  Certain cancers.  Decreased ability to fight infections.  Brain or nerve damage.  Depression.  Early (premature) death. If you are careless or you crave alcohol, it is easy to drink more than your body can handle (overdose). Alcohol overdose is a serious situation that requires hospitalization. It may lead to permanent injuries or death. What can increase my risk?  Having a family history of alcohol abuse.  Having depression or other mental health conditions.  Beginning to drink at an early age.  Binge drinking often.  Experiencing trauma, stress, and an unstable home life during childhood.  Spending time with people who drink often. What actions can I take to prevent or manage alcohol abuse and dependence?  Do not drink alcohol if: ? Your health care provider tells you not to drink. ? You are pregnant, may be pregnant, or are planning to become pregnant.  If you drink alcohol: ? Limit how much you use to:  0-1 drink a day for women.  0-2 drinks a day for men. ? Be aware of how much alcohol is in your drink. In the [place] one drink equals one 12 oz bottle of beer (355 mL), one 5 oz glass of wine (148 mL), or one 1 oz glass of hard liquor (44 mL).  Stop drinking if you have been drinking too much. This can be very hard to do if you are used to  abusing alcohol. If you begin to have withdrawal symptoms, talk with your health care provider or a person that you trust. These symptoms may include anxiety, shaky hands, headache, nausea, sweating, or not being able to sleep.  Choose to drink nonalcoholic beverages in social gatherings and places where there may be alcohol. Activity  Spend more time on activities that you enjoy that do not involve alcohol, like hobbies or exercise.  Find healthy ways to cope with stress, such as exercise, meditation, or spending time  with people you care about. General information  Talk to your family, coworkers, and friends about supporting you in your efforts to stop drinking. If they drink, ask them not to drink around you. Spend more time with people who do not drink alcohol.  If you think that you have an alcohol dependency problem: ? Tell friends or family about your concerns. ? Talk with your health care provider or another health professional about where to get help. ? Work with a Transport planner and a Regulatory affairs officer. ? Consider joining a support group for people who struggle with alcohol abuse and dependence. Where to find support   Your health care provider.  SMART Recovery: www.smartrecovery.org Therapy and support groups  Local treatment centers or chemical dependency counselors.  Local AA groups in your community: NicTax.com.pt Where to find more information  Centers for Disease Control and Prevention: http://www.wolf.info/  National Institute on Alcohol Abuse and Alcoholism: http://www.bradshaw.com/  Alcoholics Anonymous (AA): NicTax.com.pt Contact a health care provider if:  You drank more or for longer than you intended on more than one occasion.  You tried to stop drinking or to cut back on how much you drink, but you were not able to.  You often drink to the point of vomiting or passing out.  You want to drink so badly that you cannot think about anything else.  You have problems in your life due to drinking, but you continue to drink.  You keep drinking even though you feel anxious, depressed, or have experienced memory loss.  You have stopped doing the things you used to enjoy in order to drink.  You have to drink more than you used to in order to get the effect you want.  You experience anxiety, sweating, nausea, shakiness, and trouble sleeping when you try to stop drinking. Get help right away if:  You have thoughts about hurting yourself or others.  You have serious withdrawal symptoms,  including: ? Confusion. ? Racing heart. ? High blood pressure. ? Fever. If you ever feel like you may hurt yourself or others, or have thoughts about taking your own life, get help right away. You can go to your nearest emergency department or call:  Your local emergency services (911 in the [place]  A suicide crisis helpline, such as the [facility] at 228-171-3489. This is open 24 hours a day. Summary  Alcohol abuse and dependence can have a negative effect on your life. Drinking too much or too often can lead to addiction.  If you drink alcohol, limit how much you use.  If you are having trouble keeping your drinking under control, find ways to change your behavior. Hobbies, calming activities, exercise, or support groups can help.  If you feel you need help with changing your drinking habits, talk with your health care provider, a good friend, or a therapist, or go to an [place] group. This information is not intended to replace advice given to you by your health  care provider. Make sure you discuss any questions you have with your health care provider. Document Revised: 08/28/2018 Document Reviewed: 07/17/2018 Elsevier Patient Education  [place].

## 2020-03-21 NOTE — BH Assessment (Signed)
Late entry: 1:22 AM Writer contacted [facility] [phone]) EXT. [id number] for bed availability. Sharyn Lull reported that there is no one to review patients and advised TTS to call back in the morning.   1:27 AM Spoke with Herbie [place] of [place] [phone]) [phone] for bed availability. Robert requested that pt's referral packed be faxed. Task completed at 6:13AM

## 2020-03-21 NOTE — ED Notes (Signed)
Hourly rounding completed at this time, patient currently awake in room. No complaints, stable, and in no acute distress. Q15 minute rounds and monitoring via Rover and Officer to continue. °

## 2020-03-21 NOTE — ED Notes (Signed)
Hourly rounding completed at this time, patient currently asleep in room. No complaints, stable, and in no acute distress. Q15 minute rounds and monitoring via Rover and Officer to continue. 

## 2020-03-21 NOTE — BH Assessment (Addendum)
Writer spoke with the patient to complete an updated/reassessment. Patient denies SI/HI and AV/H. Patient came in the ER intoxicated, BAC 204.

## 2020-03-21 NOTE — ED Provider Notes (Signed)
Emergency Medicine Observation Re-evaluation Note  Omar Davis is a 69 y.o. male, seen on rounds today.  Pt initially presented to the ED for complaints of Alcohol Problem and Altered Mental Status Currently, the patient is sleeping.  Physical Exam  BP 129/88   Pulse (!) 103   Temp 98.3 F (36.8 C) (Oral)   Resp 20   Ht 1.727 m (5\' 8" )   Wt 86 kg   SpO2 98%   BMI 28.83 kg/m  Physical Exam Gen:  No acute distress Resp:  Breathing easily and comfortably, no accessory muscle usage Neuro:  Moving all four extremities, no gross focal neuro deficits Psych:  Resting currently, calm and cooperative when awake ED Course / MDM  EKG:EKG Interpretation  Date/Time:  Friday March 20 2020 15:20:24 EDT Ventricular Rate:  108 PR Interval:  170 QRS Duration: 102 QT Interval:  354 QTC Calculation: 474 R Axis:   -56 Text Interpretation: Sinus tachycardia Left axis deviation Abnormal ECG Confirmed by UNCONFIRMED, DOCTOR ([id number]), editor Mel Almond, Tammy [phone]) on 03/21/2020 7:22:43 AM  Clinical Course as of Mar 22 743  Fri Mar 20, 2020  1519 Reposition patient in bed and his oxygen saturations improved to 91-92% on room air.   [DS]  1519 Dr. Weber Cooks is already in the ED to see the patient, he steps in and peripherally evaluates the patient.  Indicates that patient is more likely to be placed psychiatrically   [DS]  1520  and final disposition if he is not IVC.  We have no current indications to IVC the patient this time.  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   [DS]  1634 Patient returning from CT and RN grabs me that he is more awake now complaining of back pain.  I immediately reevaluate the patient, logroll him and noticed midline lumbar spine tenderness with an overlying surgical scar.  He cannot tell me if he fell or if he hit his  lower back due to his continued acute toxic agent.  We will return to CT to image this   [DS]  [place].  Patient sitting up in bed and looking much improved from presentation.  D5 LR with K infusing.  Patient has tolerated some p.o. intake, but reports difficulty with solids because he has no teeth and no dentures.  We will give him some applesauce.  He does report he has had increased cough for the past few days, but denies fevers or known aspiration events.  I educate him of the diagnosis of pneumonia or at least aspiration pneumonitis, and plan to treat with antibiotics.  He is agreeable.   [DS]  1920 Educated nighttime nurse on plan for continued IV fluids and recheck of BMP in about 1 hour.  He is clinically improved and close to being medically cleared for psychiatry, just need improved metabolic acidosis   [DS]  2231 The patient has been placed in psychiatric observation due to the need to provide a safe environment for the patient while obtaining psychiatric consultation and evaluation, as well as ongoing medical and medication management to treat the patient's condition. The patient has not been placed under full IVC at this time.     [DS]    Clinical Course User Index [DS] Vladimir Crofts, MD   I have reviewed the labs performed to date as  well as medications administered while in observation.  Recent changes in the last 24 hours include evaluation by Dr. Weber Cooks and medical clearance.    Reportedly Dr. Weber Cooks [facility] as the [facility].  I filled out the form I was given by [facility] with TTS.  We are awaiting word back from the [place].  I put in a CIWA order and the patient has been stable overnight.  Plan  Current plan is for psychiatric admission/transfer to the [place]. Patient is not under full IVC at this time.   Hinda Kehr, MD 03/21/20 [phone]

## 2020-03-26 ENCOUNTER — Encounter: Payer: Self-pay | Admitting: *Deleted

## 2020-03-26 ENCOUNTER — Emergency Department
Admission: EM | Admit: 2020-03-26 | Discharge: 2020-03-26 | Disposition: A | Discharge status: Home / Self Care | Payer: No Typology Code available for payment source | Attending: Emergency Medicine | Admitting: Emergency Medicine

## 2020-03-26 ENCOUNTER — Other Ambulatory Visit: Payer: Self-pay

## 2020-03-26 DIAGNOSIS — G8929 Other chronic pain: Secondary | ICD-10-CM | POA: Diagnosis not present

## 2020-03-26 DIAGNOSIS — M545 Low back pain, unspecified: Secondary | ICD-10-CM | POA: Diagnosis present

## 2020-03-26 DIAGNOSIS — M5442 Lumbago with sciatica, left side: Secondary | ICD-10-CM | POA: Insufficient documentation

## 2020-03-26 DIAGNOSIS — I10 Essential (primary) hypertension: Secondary | ICD-10-CM | POA: Diagnosis not present

## 2020-03-26 MED ORDER — METHOCARBAMOL 500 MG PO TABS
750.0000 mg | ORAL_TABLET | Freq: Once | ORAL | Status: AC
  Administered 2020-03-26: 750 mg via ORAL
  Filled 2020-03-26: qty 2

## 2020-03-26 MED ORDER — LIDOCAINE 4 % EX PTCH: 1.0000 | MEDICATED_PATCH | Freq: Two times a day (BID) | CUTANEOUS | 0 refills | Status: AC | PRN

## 2020-03-26 MED ORDER — LIDOCAINE 5 % EX PTCH
1.0000 | MEDICATED_PATCH | CUTANEOUS | Status: DC
  Administered 2020-03-26: 1 via TRANSDERMAL
  Filled 2020-03-26: qty 1

## 2020-03-26 MED ORDER — METHOCARBAMOL 750 MG PO TABS: 750.0000 mg | ORAL_TABLET | Freq: Four times a day (QID) | ORAL | 0 refills | Status: AC | PRN

## 2020-03-26 NOTE — ED Notes (Signed)
Attempted to call [facility], no response

## 2020-03-26 NOTE — ED Notes (Signed)
Pt states that if we can call a cab for him, he can pay the fare. Pt states he has no one else he can call for transport

## 2020-03-26 NOTE — ED Notes (Signed)
Per charge RN, first RN is working on Geneticist, molecular. Will d/c to lobby awaiting trans

## 2020-03-26 NOTE — ED Notes (Addendum)
Pt denies any recent falls or new injuries. 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 Pt reports lower right lumbar pain that continues down his buttocks and legs.  Pt confirms he did fill the oxycodone as listed on the 31st. Pt states he takes naproxen, ibuprofen, and 1-2 oxycodone q6 prn.

## 2020-03-26 NOTE — ED Triage Notes (Addendum)
Pt to triage via wheelchair.  Pt brought in by ems with lower back pain.  States pain in both buttocks and legs.  Pt out of pain meds for a few days.  No known injury.  Pt alert.   No etoh use.  Pt calm and cooperative,

## 2020-03-26 NOTE — ED Triage Notes (Signed)
EMS brings pt in from home for c/o rt lower back pain & ran out of oxycodone

## 2020-03-27 NOTE — ED Provider Notes (Signed)
[facility] Emergency Department Provider Note  ____________________________________________   First MD Initiated Contact with Patient 03/26/20 2154     (approximate)  I have reviewed the triage vital signs and the nursing notes.   HISTORY  Chief Complaint Back Pain   HPI Omar Davis is a 69 y.o. male who presents to the emergency department for evaluation of back pain.  He states that he was given oxycodone for management of his back pain but is out and is requesting more.  He denies any EtOH use since he was discharged from our facility on October 30.  He states that he is trying to stay away from alcohol as it "gets him in trouble".  He has had no new falls, no loss of bowel or bladder function, no change in his pain location or symptoms.  He currently rates his pain a 6/10 and is located in the lumbar region.  He has been using naproxen since running out of his oxycodone with no relief.  He states that he was taking his oxycodone as prescribed.  He states that he has been evaluated by the team at the [facility] and they have recommended not proceeding with surgery and that he has been set up for an appointment with the pain clinic on November 29.         Past Medical History:  Diagnosis Date  . Alcohol abuse   . Chronic pain   . Depressed   . Hypertension   . PTSD (post-traumatic stress disorder)   . TBI (traumatic brain injury) [facility])     Patient Active Problem List   Diagnosis Date Noted  . Iron deficiency anemia 04/08/2019  . Alcohol intoxication with delirium ([place])   . Sepsis ([place]) 02/08/2019  . Essential hypertension 02/05/2019  . 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 Alcohol use disorder, severe, dependence ([place]) 08/24/2018  . 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 Suicidal ideation 09/25/2015  . Alcohol abuse 09/09/2015  . PTSD (post-traumatic stress disorder)  09/09/2015  . Agitation 09/09/2015  . Depressed   . Chronic pain     Past Surgical History:  Procedure Laterality Date  . BACK SURGERY    . HERNIA REPAIR    . KNEE SURGERY    . KNEE SURGERY    . prostectomy N/A     Prior to Admission medications   Medication Sig Start Date End Date Taking? Authorizing Provider  DULoxetine (CYMBALTA) 30 MG capsule Take 1 capsule (30 mg total) by mouth daily. Patient not taking: Reported on 02/18/2020 02/07/19   Clapacs, Madie Reno, MD  Lidocaine (HM LIDOCAINE PATCH) 4 % PTCH Apply 1 patch topically every 12 (twelve) hours as needed. 03/26/20   Marlana Salvage, PA  methocarbamol (ROBAXIN-750) 750 MG tablet Take 1 tablet (750 mg total) by mouth 4 (four) times daily as needed for up to 10 days for muscle spasms. 03/26/20 04/05/20  Marlana Salvage, PA  pravastatin (PRAVACHOL) 80 MG tablet Take 1 tablet (80 mg total) by mouth at bedtime. Patient not taking: Reported on 08/08/2019 02/07/19   Clapacs, Madie Reno, MD  traMADol (ULTRAM) 50 MG tablet Take 1 tablet (50 mg total) by mouth every 12 (twelve) hours as needed. Patient not taking: Reported on 03/19/2020 11/18/19   Sable Feil, PA-C    Allergies Codeine, Gabapentin, Lisinopril, and Penicillins  No family history on file.  Social History Social History   Tobacco Use  .  Smoking status: Never Smoker  . Smokeless tobacco: Never Used  Vaping Use  . Vaping Use: Unknown  Substance Use Topics  . Alcohol use: Yes  . Drug use: Not Currently    Review of Systems Constitutional: No fever/chills Eyes: No visual changes. ENT: No sore throat. Cardiovascular: Denies chest pain. Respiratory: Denies shortness of breath. Gastrointestinal: No abdominal pain.  No nausea, no vomiting.  No diarrhea.  No constipation. Genitourinary: Negative for dysuria. Musculoskeletal: + Back pain. Skin: Negative for rash. Neurological: Negative for headaches, focal weakness or  numbness.   ____________________________________________   PHYSICAL EXAM:  VITAL SIGNS: ED Triage Vitals  Enc Vitals Group     BP 03/26/20 2134 (!) 136/91     Pulse Rate 03/26/20 2130 (!) 103     Resp 03/26/20 2130 20     Temp 03/26/20 2130 98.5 F (36.9 C)     Temp Source 03/26/20 2130 Oral     SpO2 03/26/20 2125 100 %     Weight 03/26/20 2131 200 lb (90.7 kg)     Height 03/26/20 2131 5\' 8"  (1.727 m)     Head Circumference --      Peak Flow --      Pain Score 03/26/20 2131 6     Pain Loc --      Pain Edu? --      Excl. in [place]? --     Constitutional: Alert and oriented.  Sitting comfortably in a wheelchair. Eyes: Conjunctivae are normal.  Head: Atraumatic. Nose: No congestion/rhinnorhea. Mouth/Throat: Mucous membranes are moist.   Neck: No stridor.   Cardiovascular: Normal rate, regular rhythm.  Good peripheral circulation. Respiratory: Normal respiratory effort.  No retractions.  Musculoskeletal: There is tenderness to the midline of the lumbar spine with tenderness to the left paraspinal musculature.  The patient maintains 5/5 strength of the bilateral ankle plantarflexion, dorsiflexion, knee flexion, knee extension. Neurologic:  Normal speech and language.  Gait not assessed secondary to patient discomfort. Skin:  Skin is warm, dry and intact. No rash noted. Psychiatric: Mood and affect are normal. Speech and behavior are normal.  ____________________________________________   INITIAL IMPRESSION / ASSESSMENT AND PLAN / ED COURSE  As part of my medical decision making, I reviewed the following data within the [place] notes reviewed and incorporated, Old chart reviewed, discussed with attending Dr. 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  Review of  imaging from 03/20/2020 reveals a CT of the lumbar spine that reveals a subacute L1 transverse process fracture, remote compression injury at T12, soft tissue mass at L3-L4 and multilevel degenerative changes.  Review of the PDMP reveals that he was prescribed and filled 30 oxycodone tablets on 03/22/2020, 5 days ago.  Overall, the patient is currently cooperative and pleasant and his physical exam appears to be comparable to previous exams of the lumbar region and extremities.  Discussion with Dr. Tamala Julian was had, and we agreed that we will not continue prescribing narcotics for this patient's pain.  We will begin treatment with Robaxin as well as Lidoderm patches.  He can continue the Naprosyn that he is already on.  He was given these in our department as well as given outpatient prescription for them.  Overall, he received this well and was  discharged.  He is stable at this time for outpatient pain management follow-up.      ____________________________________________   FINAL CLINICAL IMPRESSION(S) / ED DIAGNOSES  Final diagnoses:  Chronic midline low back pain with left-sided sciatica     ED Discharge Orders         Ordered    Lidocaine (HM LIDOCAINE PATCH) 4 % PTCH  Every 12 hours PRN        03/26/20 2209    methocarbamol (ROBAXIN-750) 750 MG tablet  4 times daily PRN        03/26/20 2209          *Please note:  Omar Davis was evaluated in Emergency Department on 03/27/2020 for the symptoms described in the history of present illness. He was evaluated in the context of the global COVID-19 pandemic, which necessitated consideration that the patient might be at risk for infection with the SARS-CoV-2 virus that causes COVID-19. Institutional protocols and algorithms that pertain to the evaluation of patients at risk for COVID-19 are in a state of rapid change based on information released by regulatory bodies including the CDC and federal and state organizations. These policies  and algorithms were followed during the patient's care in the ED.  Some ED evaluations and interventions may be delayed as a result of limited staffing during and the pandemic.*   Note:  This document was prepared using Dragon voice recognition software and may include unintentional dictation errors.    Marlana Salvage, PA 03/27/20 1536    Vladimir Crofts, MD 03/28/20 1501

## 2020-04-20 ENCOUNTER — Encounter: Payer: Self-pay | Admitting: Student in an Organized Health Care Education/Training Program

## 2020-04-20 ENCOUNTER — Ambulatory Visit
Payer: No Typology Code available for payment source | Attending: Student in an Organized Health Care Education/Training Program | Admitting: Student in an Organized Health Care Education/Training Program

## 2020-04-20 ENCOUNTER — Other Ambulatory Visit: Payer: Self-pay

## 2020-04-20 VITALS — BP 144/88 | HR 61 | Temp 97.0°F | Resp 16 | Ht 68.0 in | Wt 200.0 lb

## 2020-04-20 DIAGNOSIS — F332 Major depressive disorder, recurrent severe without psychotic features: Secondary | ICD-10-CM | POA: Insufficient documentation

## 2020-04-20 DIAGNOSIS — F4310 Post-traumatic stress disorder, unspecified: Secondary | ICD-10-CM | POA: Diagnosis not present

## 2020-04-20 DIAGNOSIS — F10921 Alcohol use, unspecified with intoxication delirium: Secondary | ICD-10-CM | POA: Diagnosis not present

## 2020-04-20 DIAGNOSIS — G8929 Other chronic pain: Secondary | ICD-10-CM | POA: Insufficient documentation

## 2020-04-20 DIAGNOSIS — G894 Chronic pain syndrome: Secondary | ICD-10-CM | POA: Insufficient documentation

## 2020-04-20 DIAGNOSIS — M5416 Radiculopathy, lumbar region: Secondary | ICD-10-CM | POA: Insufficient documentation

## 2020-04-20 DIAGNOSIS — F1910 Other psychoactive substance abuse, uncomplicated: Secondary | ICD-10-CM | POA: Diagnosis present

## 2020-04-20 MED ORDER — ORPHENADRINE CITRATE 30 MG/ML IJ SOLN
30.0000 mg | Freq: Once | INTRAMUSCULAR | Status: AC
  Administered 2020-04-20: 30 mg via INTRAMUSCULAR

## 2020-04-20 MED ORDER — ORPHENADRINE CITRATE 30 MG/ML IJ SOLN
INTRAMUSCULAR | Status: AC
  Filled 2020-04-20: qty 2

## 2020-04-20 NOTE — Progress Notes (Signed)
Patient: Omar Davis  Service Category: E/M  Provider: Gillis Santa, MD  DOB: 1950-07-30  DOS: 04/20/2020  Referring Provider: [facility]  MRN: [id number]  Setting: Ambulatory outpatient  [facility]  Type: New Patient  Specialty: Interventional Pain Management    Location: Office  Delivery: Face-to-face     Primary Reason(s) for Visit: Encounter for initial evaluation of one or more chronic problems (new to examiner) potentially causing chronic pain, and posing a threat to normal musculoskeletal function. (Level of risk: High) CC: Back Pain (lumbar rignt), Knee Pain (right joint replacement), Abdominal Pain (ulcer), and Shoulder Pain (bilateral)  HPI  Omar Davis is a 69 y.o. year old, male patient, who comes for the first time to our practice referred by [place] for our initial evaluation of his chronic pain. He has Depressed; Chronic pain; Alcohol abuse; PTSD (post-traumatic stress disorder); Agitation; Suicidal ideation; Alcohol use disorder, severe, dependence ([place]); Major depressive disorder, recurrent severe without psychotic features ([place]); Alcohol-induced mood disorder ([place]); Essential hypertension; Sepsis ([place]); Alcohol intoxication with delirium ([place]); and Iron deficiency anemia on their problem list. Today he comes in for evaluation of his Back Pain (lumbar rignt), Knee Pain (right joint replacement), Abdominal Pain (ulcer), and Shoulder Pain (bilateral)  Pain Assessment: Location: Lower, Right Back (back of right knee) Radiating: down the right leg Onset: More than a month ago Duration: Chronic pain Quality: Discomfort, Constant (grabbing pain) Severity: 6 /10 (subjective, self-reported pain score)  Effect on ADL: requires with ADL'S.  barely able to walk. Timing: Constant Modifying factors: pain medication.  [facility] and that barely helps it.  was given Dilaudid in the ED and that did help a little. BP: (!) 144/88  HR:  61  Onset and Duration: Gradual, Date of onset: 2016 and Present longer than 3 months Cause of pain: tumor on spine Severity: Getting worse, NAS-11 at its worse: 10/10, NAS-11 at its best: 4/10, NAS-11 now: 6/10 and NAS-11 on the average: 4/10 Timing: Not influenced by the time of the day Aggravating Factors: Bending, Kneeling, Lifiting, Motion, Prolonged sitting, Prolonged standing, Stooping , Twisting and Walking Alleviating Factors: Medications Associated Problems: Depression, Spasms, Sweating, Weakness and Pain that does not allow patient to sleep Quality of Pain: Constant, Sharp and Throbbing Previous Examinations or Tests: MRI scan Previous Treatments: The patient denies nothing  Omar Davis is a 69 year old male who presents with a chief complaint of low back pain with radiation into right lower extremity in a dermatomal fashion as well as right knee pain.  Of note, patient has a history of polysubstance abuse, chronic PTSD and major depressive disorder.  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  Surgical evaluation has been considered however patient is deemed to be very high risk from a surgical standpoint.  He has a large perineural mass extending through the neuroforamen at the level of L3-L4 displacing the right psoas muscle resulting in L3 nerve root compression which is likely contributing to his lumbar radicular pain flare.  In regards to medications, patient has tried Cymbalta at 90 mg in the past which he states was not effective.  He has also tried nortriptyline and amitriptyline which were not effective.  He has tried gabapentin in the past which resulted in loss of consciousness he states.  He has a psychiatric history that is significant for complex PTSD and depression.  He lives with his girlfriend, Omar Davis who is a patient of mine at  the pain clinic.  Patient was very clear with me and states that he has a history of heroin, methamphetamine, cocaine, alcohol  abuse but has been clean from methamphetamine and cocaine's for the last 2 years but did recently start drinking again given severe pain.  Historic Controlled Substance Pharmacotherapy Review  Historical Monitoring: The patient  reports previous drug use. List of all UDS Test(s): Lab Results  Component Value Date   MDMA NONE DETECTED 03/20/2020   MDMA NONE DETECTED 03/17/2020   MDMA NONE DETECTED 09/07/2019   MDMA NONE DETECTED 03/25/2019   MDMA NONE DETECTED 03/13/2019   MDMA NONE DETECTED 03/11/2019   MDMA NONE DETECTED 03/05/2019   MDMA NONE DETECTED 02/28/2019   MDMA NONE DETECTED 02/19/2019   MDMA NONE DETECTED 02/19/2019   MDMA NONE DETECTED 02/07/2019   MDMA NONE DETECTED 02/03/2019   MDMA NONE DETECTED 02/01/2019   MDMA NONE DETECTED 01/29/2019   MDMA NONE DETECTED 01/23/2019   MDMA NONE DETECTED 01/19/2019   MDMA NONE DETECTED 08/22/2018   MDMA NONE DETECTED 09/08/2015   MDMA NEGATIVE 02/16/2014   COCAINSCRNUR NONE DETECTED 03/20/2020   COCAINSCRNUR NONE DETECTED 03/17/2020   COCAINSCRNUR NONE DETECTED 09/07/2019   COCAINSCRNUR NONE DETECTED 03/25/2019   COCAINSCRNUR NONE DETECTED 03/13/2019   COCAINSCRNUR NONE DETECTED 03/11/2019   COCAINSCRNUR NONE DETECTED 03/05/2019   COCAINSCRNUR NONE DETECTED 02/28/2019   COCAINSCRNUR NONE DETECTED 02/19/2019   COCAINSCRNUR NONE DETECTED 02/19/2019   COCAINSCRNUR NONE DETECTED 02/07/2019   COCAINSCRNUR NONE DETECTED 02/03/2019   COCAINSCRNUR NONE DETECTED 02/01/2019   COCAINSCRNUR NONE DETECTED 01/29/2019   COCAINSCRNUR NONE DETECTED 01/23/2019   COCAINSCRNUR NONE DETECTED 01/19/2019   COCAINSCRNUR NONE DETECTED 08/22/2018   COCAINSCRNUR NONE DETECTED 09/08/2015   COCAINSCRNUR NEGATIVE 02/16/2014   PCPSCRNUR NONE DETECTED 03/20/2020   PCPSCRNUR NONE DETECTED 03/17/2020   PCPSCRNUR NONE DETECTED 09/07/2019   PCPSCRNUR NONE DETECTED 03/25/2019   PCPSCRNUR NONE DETECTED 03/13/2019   PCPSCRNUR NONE DETECTED 03/11/2019    PCPSCRNUR NONE DETECTED 03/05/2019   PCPSCRNUR NONE DETECTED 02/28/2019   PCPSCRNUR NONE DETECTED 02/19/2019   PCPSCRNUR NONE DETECTED 02/19/2019   PCPSCRNUR NONE DETECTED 02/07/2019   PCPSCRNUR NONE DETECTED 02/03/2019   PCPSCRNUR NONE DETECTED 02/01/2019   PCPSCRNUR NONE DETECTED 01/29/2019   PCPSCRNUR NONE DETECTED 01/23/2019   PCPSCRNUR NONE DETECTED 01/19/2019   PCPSCRNUR NONE DETECTED 08/22/2018   PCPSCRNUR NONE DETECTED 09/08/2015   PCPSCRNUR NEGATIVE 02/16/2014   THCU NONE DETECTED 03/20/2020   THCU NONE DETECTED 03/17/2020   THCU NONE DETECTED 09/07/2019   THCU NONE DETECTED 03/25/2019   THCU NONE DETECTED 03/13/2019   THCU NONE DETECTED 03/11/2019   THCU NONE DETECTED 03/05/2019   THCU NONE DETECTED 02/28/2019   THCU NONE DETECTED 02/19/2019   THCU NONE DETECTED 02/19/2019   THCU NONE DETECTED 02/07/2019   THCU NONE DETECTED 02/03/2019   THCU NONE DETECTED 02/01/2019   THCU NONE DETECTED 01/29/2019   THCU NONE DETECTED 01/23/2019   THCU NONE DETECTED 01/19/2019   THCU NONE DETECTED 08/22/2018   THCU NONE DETECTED 09/08/2015   THCU NEGATIVE 02/16/2014   ETH 204 (H) 03/20/2020   ETH 196 (H) 03/18/2020   ETH 156 (H) 03/17/2020   ETH 271 (H) 02/18/2020   ETH 226 (H) 02/08/2020   ETH 326 (HH) 09/06/2019   ETH 311 (HH) 09/02/2019   ETH 235 (H) 08/20/2019   ETH 148 (H) 08/17/2019   ETH 246 (H) 08/14/2019   ETH 160 (H) 08/12/2019   ETH 177 (H) 08/07/2019  ETH 154 (H) 08/03/2019   ETH 253 (H) 05/29/2019   ETH 191 (H) 04/09/2019   ETH 418 (HH) 03/27/2019   ETH 157 (H) 03/25/2019   ETH 308 (HH) 03/12/2019   ETH 277 (H) 03/11/2019   ETH 240 (H) 03/05/2019   ETH 292 (H) 03/02/2019   ETH 305 (HH) 02/28/2019   ETH 243 (H) 02/19/2019   ETH 179 (H) 02/19/2019   ETH 217 (H) 02/07/2019   ETH 281 (H) 02/03/2019   ETH 276 (H) 02/02/2019   ETH 235 (H) 02/01/2019   ETH 196 (H) 01/29/2019   ETH 182 (H) 01/23/2019   ETH 219 (H) 01/18/2019   ETH <10 08/22/2018   ETH  214 (H) 09/24/2015   ETH 288 (H) 09/08/2015   List of other Serum/Urine Drug Screening Test(s):  Lab Results  Component Value Date   COCAINSCRNUR NONE DETECTED 03/20/2020   COCAINSCRNUR NONE DETECTED 03/17/2020   COCAINSCRNUR NONE DETECTED 09/07/2019   COCAINSCRNUR NONE DETECTED 03/25/2019   COCAINSCRNUR NONE DETECTED 03/13/2019   COCAINSCRNUR NONE DETECTED 03/11/2019   COCAINSCRNUR NONE DETECTED 03/05/2019   COCAINSCRNUR NONE DETECTED 02/28/2019   COCAINSCRNUR NONE DETECTED 02/19/2019   COCAINSCRNUR NONE DETECTED 02/19/2019   COCAINSCRNUR NONE DETECTED 02/07/2019   COCAINSCRNUR NONE DETECTED 02/03/2019   COCAINSCRNUR NONE DETECTED 02/01/2019   COCAINSCRNUR NONE DETECTED 01/29/2019   COCAINSCRNUR NONE DETECTED 01/23/2019   COCAINSCRNUR NONE DETECTED 01/19/2019   COCAINSCRNUR NONE DETECTED 08/22/2018   COCAINSCRNUR NONE DETECTED 09/08/2015   COCAINSCRNUR NEGATIVE 02/16/2014   THCU NONE DETECTED 03/20/2020   THCU NONE DETECTED 03/17/2020   THCU NONE DETECTED 09/07/2019   THCU NONE DETECTED 03/25/2019   THCU NONE DETECTED 03/13/2019   THCU NONE DETECTED 03/11/2019   THCU NONE DETECTED 03/05/2019   THCU NONE DETECTED 02/28/2019   THCU NONE DETECTED 02/19/2019   THCU NONE DETECTED 02/19/2019   THCU NONE DETECTED 02/07/2019   THCU NONE DETECTED 02/03/2019   THCU NONE DETECTED 02/01/2019   THCU NONE DETECTED 01/29/2019   THCU NONE DETECTED 01/23/2019   THCU NONE DETECTED 01/19/2019   THCU NONE DETECTED 08/22/2018   THCU NONE DETECTED 09/08/2015   THCU NEGATIVE 02/16/2014   ETH 204 (H) 03/20/2020   ETH 196 (H) 03/18/2020   ETH 156 (H) 03/17/2020   ETH 271 (H) 02/18/2020   ETH 226 (H) 02/08/2020   ETH 326 (HH) 09/06/2019   ETH 311 (HH) 09/02/2019   ETH 235 (H) 08/20/2019   ETH 148 (H) 08/17/2019   ETH 246 (H) 08/14/2019   ETH 160 (H) 08/12/2019   ETH 177 (H) 08/07/2019   ETH 154 (H) 08/03/2019   ETH 253 (H) 05/29/2019   ETH 191 (H) 04/09/2019   ETH 418 (HH)  03/27/2019   ETH 157 (H) 03/25/2019   ETH 308 (HH) 03/12/2019   ETH 277 (H) 03/11/2019   ETH 240 (H) 03/05/2019   ETH 292 (H) 03/02/2019   ETH 305 (HH) 02/28/2019   ETH 243 (H) 02/19/2019   ETH 179 (H) 02/19/2019   ETH 217 (H) 02/07/2019   ETH 281 (H) 02/03/2019   ETH 276 (H) 02/02/2019   ETH 235 (H) 02/01/2019   ETH 196 (H) 01/29/2019   ETH 182 (H) 01/23/2019   ETH 219 (H) 01/18/2019   ETH <10 08/22/2018   ETH 214 (H) 09/24/2015   ETH 288 (H) 09/08/2015   Historical Background Evaluation: [place] PMP: PDMP not reviewed this encounter. Online review of the past 80-monthperiod conducted.  [place] Department of public safety, offender search: Editor, commissioning Information) Non-contributory Risk Assessment Profile: Aberrant behavior: drug seeking behavior, extensive time discussing medicaiton, impaired control over use of medications, non-compliance with medical instructions on the proper use of the medication, non-compliance with practice rules and regulations, obtaining controlled substances from inappropriate sources and use of illicit substances Risk factors for fatal opioid overdose: history of alcoholism, history of non-compliance with medical advice, history of substance abuse and history of substance use disorder Fatal overdose hazard ratio (HR): Calculation deferred Non-fatal overdose hazard ratio (HR): Calculation deferred Risk of opioid abuse or dependence: 0.7-3.0% with doses ? 36 MME/day and 6.1-26% with doses ? 120 MME/day. Substance use disorder (SUD) risk level: See below Personal History of Substance Abuse (SUD-Substance use disorder):  Alcohol: Positive Male or Male  Illegal Drugs: Positive Male or Male  Rx Drugs: Positive Male or Male  ORT Risk Level calculation: High Risk  Opioid Risk Tool - 04/20/20 1402      Family History of Substance Abuse   Alcohol Positive Male    Illegal Drugs Negative    Rx Drugs Negative      Personal History of Substance Abuse    Alcohol Positive Male or Male    Illegal Drugs Positive Male or Male    Rx Drugs Positive Male or Male      Age   Age between 24-45 years  No      Psychological Disease   Psychological Disease Positive    ADD Negative    OCD Negative    Bipolar Negative    Schizophrenia Negative    Depression Positive   PTSD     Total Score   Opioid Risk Tool Scoring 18    Opioid Risk Interpretation High Risk          ORT Scoring interpretation table:  Score <3 = Low Risk for SUD  Score between 4-7 = Moderate Risk for SUD  Score >8 = High Risk for Opioid Abuse   PHQ-2 Depression Scale:  Total score:    PHQ-2 Scoring interpretation table: (Score and probability of major depressive disorder)  Score 0 = No depression  Score 1 = 15.4% Probability  Score 2 = 21.1% Probability  Score 3 = 38.4% Probability  Score 4 = 45.5% Probability  Score 5 = 56.4% Probability  Score 6 = 78.6% Probability   PHQ-9 Depression Scale:  Total score:    PHQ-9 Scoring interpretation table:  Score 0-4 = No depression  Score 5-9 = Mild depression  Score 10-14 = Moderate depression  Score 15-19 = Moderately severe depression  Score 20-27 = Severe depression (2.4 times higher risk of SUD and 2.89 times higher risk of overuse)   Pharmacologic Plan: Non-opioid analgesic therapy offered.            Initial impression: High risk for opiate therapy. Will focus on interventional pain management.  Medication management to continue at [facility].  Patient not a candidate for chronic opioid therapy given psychiatric disease including depression, complex PTSD and history of polysubstance abuse with cocaine, heroin, methamphetamines. Meds   Current Outpatient Medications:  .  amLODipine (NORVASC) 10 MG tablet, Take 1 tablet by mouth daily., Disp: , Rfl:  .  fluticasone (FLONASE) 50 MCG/ACT nasal spray, Place 2 sprays into both nostrils daily., Disp: , Rfl:  .  DULoxetine (CYMBALTA) 30 MG capsule, Take 1 capsule (30 mg  total) by mouth daily. (Patient not taking: Reported on 02/18/2020), Disp: 30 capsule, Rfl: 0 .  Lidocaine (HM LIDOCAINE PATCH) 4 % PTCH, Apply 1 patch topically every 12 (twelve) hours as needed. (Patient not taking: Reported on 04/20/2020), Disp: 30 patch, Rfl: 0 .  pravastatin (PRAVACHOL) 80 MG tablet, Take 1 tablet (80 mg total) by mouth at bedtime. (Patient not taking: Reported on 08/08/2019), Disp: 30 tablet, Rfl: 0 .  traMADol (ULTRAM) 50 MG tablet, Take 1 tablet (50 mg total) by mouth every 12 (twelve) hours as needed. (Patient not taking: Reported on 03/19/2020), Disp: 12 tablet, Rfl: 0  Imaging Review    Narrative CLINICAL DATA:  Head trauma, minor. Neck trauma. Additional provided: Contusion and multiple lacerations to right forehead after fall sustaining head injury.  EXAM: CT HEAD WITHOUT CONTRAST  CT CERVICAL SPINE WITHOUT CONTRAST  TECHNIQUE: Multidetector CT imaging of the head and cervical spine was performed following the standard protocol without intravenous contrast. Multiplanar CT image reconstructions of the cervical spine were also generated.  COMPARISON:  Head CT 03/28/2019.  CT cervical spine 03/28/2019.  FINDINGS: CT HEAD FINDINGS  Brain:  Stable, mild generalized cerebral atrophy.  Stable, mild ill-defined hypoattenuation within the cerebral white matter which is nonspecific, but consistent with chronic small vessel ischemic disease.  There is no acute intracranial hemorrhage.  No demarcated cortical infarct.  No extra-axial fluid collection.  No evidence of intracranial mass.  No midline shift.  Vascular: No hyperdense vessel.  Atherosclerotic calcifications.  Skull: Redemonstrated sequela of prior right-sided craniotomy/cranioplasty with underlying dural thickening. No calvarial fracture.  Sinuses/Orbits: Visualized orbits show no acute finding. Mild ethmoid sinus mucosal thickening. No significant mastoid effusion. Redemonstrated  chronic, displaced bilateral nasal bone fractures.  Other: Right periorbital and forehead soft tissue swelling/hematoma  CT CERVICAL SPINE FINDINGS  Alignment: Straightening of the expected cervical lordosis. 2 mm C3-C4 grade 1 retrolisthesis. Rightward rotation of C1 upon C2 may be related to patient head positioning at the time of examination.  Skull base and vertebrae: The basion-dental and atlanto-dental intervals are maintained.No evidence of acute fracture to the cervical spine.  Soft tissues and spinal canal: No prevertebral fluid or swelling. No visible canal hematoma.  Disc levels: Cervical spondylosis with multilevel disc space narrowing, posterior disc osteophytes, uncovertebral and facet hypertrophy. Disc space narrowing is severe at the C3-C4 through T1-T2 levels. No high-grade bony spinal canal stenosis. Multilevel bony neural foraminal narrowing.  Upper chest: No consolidation within the imaged lung apices. No visible pneumothorax. 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  IMPRESSION: CT head:  1. No evidence of acute intracranial abnormality. 2. Right forehead and periorbital soft tissue swelling/hematoma. 3. Stable mild generalized cerebral atrophy and chronic small vessel ischemic disease. 4. Redemonstrated chronic sequela of prior right-sided craniotomy/cranioplasty. 5. Mild ethmoid sinus mucosal thickening. 6. Redemonstrated chronic, displaced bilateral nasal bone fractures.  CT cervical spine:  1. No evidence of acute fracture to the cervical spine. 2. Rightward rotation of C1 upon C2 may be related to patient head positioning at the time of examination. However, clinical correlation is recommended. 3. Unchanged 2 mm C3-C4 grade 1 retrolisthesis. 4. Advanced cervical spondylosis as described.   Electronically Signed By: Kellie Simmering DO On: 02/18/2020  18:49  Narrative FINDINGS CLINICAL DATA:  RIGHT SHOULDER PAIN. RIGHT SHOULDER (THREE VIEWS) NO COMPARISON. THERE IS CALCIFICATION ADJACENT TO THE CORACOID PROCESS AND ALONG THE INFERIOR ASPECT OF THE DISTAL CLAVICLE.  THIS IS PROBABLY DUE TO AN OLD INJURY OF THE CORACOCLAVICULAR LIGAMENT.  THERE IS SPURRING AND  IRREGULARITY OF THE INFERIOR HUMERAL HEAD AND INFERIOR GLENOID, PROBABLY DUE TO AN OLD INJURY SUCH AS A DISLOCATION OR CARTILAGE INJURY. NO ACUTE FRACTURE IS SEEN. IMPRESSION CHRONIC-APPEARING INJURIES AS ABOVE.  Shoulder-L DG: Results for orders placed in visit on 12/13/01  DG Shoulder Left  Narrative FINDINGS CLINICAL DATA:  INJURED LEFT SHOULDER. LEFT SHOULDER THREE VIEWS 12/13/01 COMPARISON 10/06/01.  THERE IS NO EVIDENCE OF AN ACUTE FRACTURE OR DISLOCATION.  THERE ARE RATHER MARKED HYPERTROPHIC AND DEGENERATIVE CHANGES IN THE GLENOHUMERAL JOINT, AND THESE HAVE PROGRESSED RATHER RAPIDLY SINCE THE PREVIOUS EXAMINATION.  THE ACROMIOCLAVICULAR JOINT APPEARS INTACT.  A SMALL SUBACROMIAL SPUR MAY BE PRESENT. IMPRESSION NO ACUTE SKELETAL ABNORMALITY.  RAPIDLY PROGRESSIVE GLENOHUMERAL DEGENERATIVE CHANGE.   MR LUMBAR SPINE WO CONTRAST  Narrative CLINICAL DATA:  Low back pain with right leg pain extending into the foot.  EXAM: MRI LUMBAR SPINE WITHOUT CONTRAST  TECHNIQUE: Multiplanar, multisequence MR imaging of the lumbar spine was performed. No intravenous contrast was administered.  COMPARISON:  12/16/2014  FINDINGS: Segmentation:  Standard lumbar numbering  Alignment: Straightening of the lumbar spine with slight T11-12 anterolisthesis and T12-L1 retrolisthesis.  Vertebrae: No evidence of fracture, discitis, or aggressive bone lesion  Conus medullaris and cauda equina: Conus extends to the T12-L1 level. Conus and cauda equina appear normal.  Paraspinal and other soft tissues: Heterogeneous but primarily T2 hyperintense mass mildly expanding the right  L3-4 foramen and extending inferiorly deep to the right psoas along the lumbar plexus, increased in size from prior when this was thought to reflect a perineural cyst. The mass measures at least 5.4 cm in length and 2.6 cm anterior to posterior.  Disc levels:  T12- L1: Disc narrowing and ridging with a downward pointing central protrusion. Mild bilateral foraminal stenosis. Mild spinal stenosis  L1-L2: Disc narrowing and bulging with endplate ridging. Posterior element hypertrophy. Moderate spinal stenosis. Moderate right foraminal narrowing  L2-L3: Disc narrowing with asymmetric right-sided height loss, bulging, and ridging. Facet osteoarthritis with spurring on both sides. No compressive stenosis. Laminectomy.  L3-L4: Disc degeneration with asymmetric right-sided height loss and ridging. Degenerative facet spurring on both sides. Patent canal after laminectomy. The right canal is filled by soft tissue mass as described above. Mild to moderate left foraminal narrowing  L4-L5: Disc narrowing and bulging eccentric to the left where there is bulky endplate ridging. Degenerative facet spurring on both sides. Left more than right moderate foraminal impingement. The canal is patent after laminectomy  L5-S1:Facet spurring with posterior-lateral fusion changes. Laminectomy. The canal and foramina are patent.  IMPRESSION: 1. Findings of right L3 nerve sheath tumor extending from the foramen to the lumbar plexus, at least 5.3 cm in length and increased in size when compared to 2016 (when it was thought to reflect a perineural cyst). 2. Moderate foraminal narrowings most notable on the right at L1-2 and bilaterally at L4-5. 3. L1-2 moderate spinal stenosis. Laminectomy from L2-3 to L5-S1 with patent canal.   Electronically Signed By: Monte Fantasia M.D. On: 12/16/2019 09:15    Narrative CLINICAL DATA:  Low back pain after fall, concern for fracture.  EXAM: CT LUMBAR SPINE  WITHOUT CONTRAST  TECHNIQUE: Multidetector CT imaging of the lumbar spine was performed without intravenous contrast administration. Multiplanar CT image reconstructions were also generated.  COMPARISON:  MRI 12/15/2019, radiograph 11/18/2019  FINDINGS: Segmentation: 5 normally formed lumbar type vertebral levels. Lowest fully formed disc space denoted as L5-S1.  Alignment: Levocurvature the lumbar spine apex L2-L3. Slight 2 mm anterolisthesis T11  on 12. Mild stepwise retrolisthesis T12-L2 favored to be on a degenerative basis without spondylolysis. No abnormally widened, perched or jumped facets.  Vertebrae: No clear acute fracture is identified. Subacute appearing nondisplaced fracture of the right L1 transverse process given some mild callus formation. Favor remote anterior wedging deformity at the T12 level with up to 40% height loss anteriorly without a clear fracture line or surrounding swelling to suggest acuity. No other significant vertebral body height loss. Arthrosis of the bilateral SI joints. Indeterminate sclerotic and lucent lesion in the left SI joint.  Paraspinal and other soft tissues: Lobular soft tissue attenuation (50 HU) structure extending through the right neural foramen at L3-4 with some associated smooth associated bony remodeling and widening of the neural foramen towards the extraforaminal zone. This measures approximately 3.3 x 3.7 x 4.9 cm though accurate measurement is difficult to fully ascertain given lack of contrast media (5/69, 7/26). 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 No associated stranding or inflammatory features are evident. No other paravertebral fluid, swelling or gas. The included portions of posterior abdomen and pelvis reveal aortoiliac atherosclerosis.  Disc levels:  Level by level evaluation of the lumbar spine below:  T11-T12: 2 mm anterolisthesis. Moderate  disc height loss. No significant posterior disc abnormality. Moderate bilateral facet arthropathy. No significant canal stenosis. Mild bilateral foraminal narrowing.  T12-L1: Severe disc height loss and posterior ridging with central-right central disc protrusion and spur inferiorly directed. Moderate bilateral facet arthropathy. Mild-to-moderate spinal stenosis and bilateral foraminal narrowing.  L1-L2: Severe disc height loss, posterior ridging and inferiorly directed spurring centrally with moderate bilateral facet osteoarthropathy. Moderate stenosis and bilateral foraminal narrowing.  L2-L3: Moderate disc height loss, global disc bulge and moderate bilateral facet arthropathy. Disc height loss is asymmetric to the right with at most mild canal stenosis and moderate right and mild left foraminal narrowing with right lateral recess stenosis.  L3-L4: Disc degeneration and asymmetric right sided disc height loss with posterior ridging and global disc bulge. Moderate bilateral facet arthropathy. Right perineal cyst, as described above. Mild-to-moderate left foraminal narrowing and mild canal stenosis as well.  L4-L5: Asymmetric disc height loss more pronounced towards the left with global disc bulge asymmetric to the left extraforaminal zone with bulky ridging. Prior laminotomy. No canal stenosis. Severe left and moderate right foraminal narrowing with bilateral effacement of the lateral recesses.  L5-S1: Moderate disc height loss and moderate to severe bilateral foraminal narrowing with posterior decompressive changes. No spinal canal stenosis but with moderate bilateral foraminal narrowing and lateral recess stenosis.  IMPRESSION: 1. Subacute appearing nondisplaced fracture of the right L1 transverse process given some mild callus formation. 2. No acute fracture is evident. 3. Remote wedging deformity at the T12 level with up to 40% height loss anteriorly. 4. Levocurvature  the lumbar spine apex L2-L3. Mild anterolisthesis T11-12 and stepwise retrolisthesis T12-L2 favored to be on a degenerative basis without spondylolysis. 5. Lobular soft tissue attenuation structure extending through the right neural foramen at L3-4 with some associated smooth associated bony remodeling and widening of the neural foramen towards the extraforaminal zone. 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 6. Multilevel degenerative changes of the lumbar spine as described level by level above including multilevel moderate canal stenoses and moderate to severe foraminal narrowing 7. Indeterminate sclerotic and lucent lesion in the left SI joint. No particularly aggressive features. 8. Aortic Atherosclerosis (ICD10-I70.0).  Electronically Signed By: Lovena Le M.D. On: 03/20/2020 17:58  Narrative CLINICAL DATA:  Right leg pain secondary to a twisting injury two days ago.  EXAM: DG HIP (WITH OR WITHOUT PELVIS) 2-3V RIGHT  COMPARISON:  None.  FINDINGS: There is no evidence of hip fracture or dislocation. There is no evidence of arthropathy or other focal bone abnormality of the right hip. Previous posterior decompression at L4-5 and L5-S1.  IMPRESSION: Negative.   Electronically Signed By: Lorriane Shire M.D. On: 11/18/2019 10:29   Narrative CLINICAL DATA:  Status post right knee replacement, recent fall with right knee pain, initial encounter  EXAM: RIGHT KNEE - 1-2 VIEW  COMPARISON:  10/24/2014  FINDINGS: Right knee replacement is again identified and stable. No acute bony abnormality is seen. Some calcifications within the quadriceps tendon are again seen and stable. No acute abnormality is noted.  IMPRESSION: Chronic changes without acute abnormality.   Electronically Signed By: Inez Catalina M.D. On: 10/31/2014 19:57    Narrative CLINICAL DATA:  Pain for several weeks.  No  known injury  EXAM: RIGHT KNEE - COMPLETE 4+ VIEW  COMPARISON:  May 11, 2014  FINDINGS: Frontal, tunnel, lateral, and sunrise patellar images were obtained. The patient is status post total knee arthroplasty with femoral and tibial prosthetic components appearing well-seated. No fracture or dislocation. No effusion. There is a spur arising from the anterior superior patella. There are several calcified phleboliths anterior to the distal femur.  IMPRESSION: Distal quadriceps tendinosis. 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 No fracture or dislocation. No joint effusion. No erosive change.   Electronically Signed By: Lowella Grip III M.D. On: 10/24/2014 11:03     Complexity Note: Imaging results reviewed. Results shared with Mr. Nowling, using Layman's terms.                         ROS  Cardiovascular: No reported cardiovascular signs or symptoms such as High blood pressure, coronary artery disease, abnormal heart rate or rhythm, heart attack, blood thinner therapy or heart weakness and/or failure Pulmonary or Respiratory: Temporary stoppage of breathing during sleep Neurological: No reported neurological signs or symptoms such as seizures, abnormal skin sensations, urinary and/or fecal incontinence, being born with an abnormal open spine and/or a tethered spinal cord Psychological-Psychiatric: Anxiousness and Depressed Gastrointestinal: Vomiting blood (Ulcers) Genitourinary: No reported renal or genitourinary signs or symptoms such as difficulty voiding or producing urine, peeing blood, non-functioning kidney, kidney stones, difficulty emptying the bladder, difficulty controlling the flow of urine, or chronic kidney disease Hematological: Weakness due to low blood hemoglobin or red blood cell count (Anemia) Endocrine: No reported endocrine signs or symptoms such as high or low blood sugar, rapid heart rate due to high thyroid  levels, obesity or weight gain due to slow thyroid or thyroid disease Rheumatologic: Joint aches and or swelling due to excess weight (Osteoarthritis) Musculoskeletal: Negative for myasthenia gravis, muscular dystrophy, multiple sclerosis or malignant hyperthermia Work History: Legally disabled  Allergies  Mr. Gullett is allergic to codeine, gabapentin, lisinopril, and penicillins.  Laboratory Chemistry Profile   Renal Lab Results  Component Value Date   BUN 13 03/20/2020   CREATININE 1.08 03/20/2020   GFRAA >60 02/18/2020   GFRNONAA >60 03/20/2020   PROTEINUR NEGATIVE 08/14/2019     Electrolytes Lab Results  Component Value Date   NA 140 03/20/2020   K 3.6 03/20/2020   CL 108 03/20/2020   CALCIUM 8.9 03/20/2020  MG 2.1 03/20/2020   PHOS 3.4 02/05/2019     Hepatic Lab Results  Component Value Date   AST 79 (H) 03/20/2020   ALT 37 03/20/2020   ALBUMIN 4.2 03/20/2020   ALKPHOS 136 (H) 03/20/2020   LIPASE 49 02/18/2020     ID Lab Results  Component Value Date   Stella NEGATIVE 03/17/2020   MRSAPCR NEGATIVE 02/08/2019     Bone No results found for: VD25OH, [id number], WO0321YY4, MG5003BC4, 25OHVITD1, 25OHVITD2, 25OHVITD3, TESTOFREE, TESTOSTERONE   Endocrine Lab Results  Component Value Date   GLUCOSE 196 (H) 03/20/2020   GLUCOSEU NEGATIVE 08/14/2019   HGBA1C 5.4 02/06/2019   TSH 1.421 02/08/2019     Neuropathy Lab Results  Component Value Date   HGBA1C 5.4 02/06/2019     CNS No results found for: COLORCSF, APPEARCSF, RBCCOUNTCSF, WBCCSF, POLYSCSF, LYMPHSCSF, EOSCSF, PROTEINCSF, GLUCCSF, JCVIRUS, CSFOLI, IGGCSF, LABACHR, ACETBL, LABACHR, ACETBL   Inflammation (CRP: Acute  ESR: Chronic) Lab Results  Component Value Date   ESRSEDRATE 3 05/12/2014   LATICACIDVEN 2.2 ([place]) 02/08/2019     Rheumatology No results found for: RF, ANA, LABURIC, URICUR, LYMEIGGIGMAB, LYMEABIGMQN, HLAB27   Coagulation Lab Results  Component Value Date   INR 1.1  01/26/2019   LABPROT 13.6 01/26/2019   PLT 298 03/20/2020     Cardiovascular Lab Results  Component Value Date   HGB 17.0 03/20/2020   HCT 50.2 03/20/2020     Screening Lab Results  Component Value Date   SARSCOV2NAA NEGATIVE 03/17/2020   MRSAPCR NEGATIVE 02/08/2019     Cancer No results found for: CEA, CA125, LABCA2   Allergens No results found for: ALMOND, APPLE, ASPARAGUS, AVOCADO, BANANA, BARLEY, BASIL, BAYLEAF, GREENBEAN, LIMABEAN, WHITEBEAN, BEEFIGE, REDBEET, BLUEBERRY, BROCCOLI, CABBAGE, MELON, CARROT, CASEIN, CASHEWNUT, CAULIFLOWER, CELERY     Note: Lab results reviewed.  PFSH  Drug: Mr. Agena  reports previous drug use. Alcohol:  reports previous alcohol use. Tobacco:  reports that he has never smoked. He has never used smokeless tobacco. Medical:  has a past medical history of Alcohol abuse, Chronic pain, Depressed, Hypertension, PTSD (post-traumatic stress disorder), and TBI (traumatic brain injury) ([place]). Family: family history is not on file.  Past Surgical History:  Procedure Laterality Date  . BACK SURGERY    . HERNIA REPAIR    . KNEE SURGERY    . KNEE SURGERY    . prostectomy N/A    Active Ambulatory Problems    Diagnosis Date Noted  . Depressed   . Chronic pain   . Alcohol abuse 09/09/2015  . PTSD (post-traumatic stress disorder) 09/09/2015  . Agitation 09/09/2015  . Suicidal ideation 09/25/2015  . Alcohol use disorder, severe, dependence ([place]) 08/24/2018  . Major depressive disorder, recurrent severe without psychotic features ([place]) 08/24/2018  . Alcohol-induced mood disorder ([place]) 01/24/2019  . Essential hypertension 02/05/2019  . Sepsis ([place]) 02/08/2019  . Alcohol intoxication with delirium ([place])   . Iron deficiency anemia 04/08/2019   Resolved Ambulatory Problems    Diagnosis Date Noted  . Substance induced mood disorder ([place]) 09/09/2015   Past Medical History:  Diagnosis Date  . Hypertension   . TBI (traumatic brain injury) [facility])     Constitutional Exam  General appearance: alert, cooperative, distracted, anxious and diaphoretic Vitals:   04/20/20 1356  BP: (!) 144/88  Davis: 61  Resp: 16  Temp: (!) 97 F (36.1 C)  TempSrc: Temporal  SpO2: 100%  Weight: 200 lb (90.7 kg)  Height: 5' 8" (1.727 m)  BMI Assessment: Estimated body mass index is 30.41 kg/m as calculated from the following:   Height as of this encounter: 5' 8" (1.727 m).   Weight as of this encounter: 200 lb (90.7 kg).  BMI interpretation table: BMI level Category Range association with higher incidence of chronic pain  <18 kg/m2 Underweight   18.5-24.9 kg/m2 Ideal body weight   25-29.9 kg/m2 Overweight Increased incidence by 20%  30-34.9 kg/m2 Obese (Class I) Increased incidence by 68%  35-39.9 kg/m2 Severe obesity (Class II) Increased incidence by 136%  >40 kg/m2 Extreme obesity (Class III) Increased incidence by 254%   Patient's current BMI Ideal Body weight  Body mass index is 30.41 kg/m. Ideal body weight: 68.4 kg (150 lb 12.7 oz) Adjusted ideal body weight: 77.3 kg (170 lb 7.6 oz)   BMI Readings from Last 4 Encounters:  04/20/20 30.41 kg/m  03/26/20 30.41 kg/m  03/20/20 28.83 kg/m  03/18/20 28.83 kg/m   Wt Readings from Last 4 Encounters:  04/20/20 200 lb (90.7 kg)  03/26/20 200 lb (90.7 kg)  03/20/20 189 lb 9.5 oz (86 kg)  03/18/20 189 lb 9.5 oz (86 kg)    Psych/Mental status: Alert, oriented x 3 (person, place, & time)       Eyes: PERLA Respiratory: Labored breathing  Upper Extremity (UE) Exam    Side: Right upper extremity  Side: Left upper extremity  Skin & Extremity Inspection: Skin color, temperature, and hair growth are WNL. No peripheral edema or cyanosis. No masses, redness, swelling, asymmetry, or associated skin lesions. No contractures.  Skin & Extremity Inspection: Skin color, temperature, and hair growth are WNL. No peripheral edema or cyanosis. No masses, redness, swelling, asymmetry, or associated skin  lesions. No contractures.  Functional ROM: Unrestricted ROM          Functional ROM: Unrestricted ROM          Muscle Tone/Strength: Functionally intact. No obvious neuro-muscular anomalies detected.   Muscle Tone/Strength: Functionally intact. No obvious neuro-muscular anomalies detected.  Sensory (Neurological): Unimpaired          Sensory (Neurological): Unimpaired          Palpation: No palpable anomalies              Palpation: No palpable anomalies              Provocative Test(s):  Phalen's test: deferred Tinel's test: deferred Apley's scratch test (touch opposite shoulder):  Action 1 (Across chest): deferred Action 2 (Overhead): deferred Action 3 (LB reach): deferred   Provocative Test(s):  Phalen's test: deferred Tinel's test: deferred Apley's scratch test (touch opposite shoulder):  Action 1 (Across chest): deferred Action 2 (Overhead): deferred Action 3 (LB reach): deferred    Thoracic Spine Area Exam  Skin & Axial Inspection: No masses, redness, or swelling Alignment: Symmetrical Functional ROM: Unrestricted ROM Stability: No instability detected Muscle Tone/Strength: Functionally intact. No obvious neuro-muscular anomalies detected. Sensory (Neurological): Unimpaired Muscle strength & Tone: No palpable anomalies  Lumbar Exam  Skin & Axial Inspection: No masses, redness, or swelling Alignment: Symmetrical Functional ROM: Pain restricted ROM affecting primarily the right Stability: No instability detected Muscle Tone/Strength: Functionally intact. No obvious neuro-muscular anomalies detected. Sensory (Neurological): Dermatomal pain pattern right L3-L4 Palpation: No palpable anomalies       Provocative Tests: Hyperextension/rotation test: deferred today       Lumbar quadrant test (Kemp's test): (+) on the right for foraminal stenosis, right L3-L4 Lateral bending test: (+) ipsilateral radicular pain, on the  right. Positive for right-sided foraminal  stenosis. Patrick's Maneuver: deferred today                   FABER* test: deferred today                   S-I anterior distraction/compression test: deferred today         S-I lateral compression test: deferred today         S-I Thigh-thrust test: deferred today         S-I Gaenslen's test: deferred today         *(Flexion, ABduction and External Rotation)  Gait & Posture Assessment  Ambulation: Limited Gait: Significantly limited. Dependent on assistive device to ambulate Posture: Difficulty standing up straight, due to pain   Lower Extremity Exam    Side: Right lower extremity  Side: Left lower extremity  Stability: No instability observed          Stability: No instability observed          Skin & Extremity Inspection: Skin color, temperature, and hair growth are WNL. No peripheral edema or cyanosis. No masses, redness, swelling, asymmetry, or associated skin lesions. No contractures.  Skin & Extremity Inspection: Skin color, temperature, and hair growth are WNL. No peripheral edema or cyanosis. No masses, redness, swelling, asymmetry, or associated skin lesions. No contractures.  Functional ROM: Pain restricted ROM for hip and knee joints Limited SLR (straight leg raise)  Functional ROM: Unrestricted ROM                  Muscle Tone/Strength: Functionally intact. No obvious neuro-muscular anomalies detected.  Muscle Tone/Strength: Functionally intact. No obvious neuro-muscular anomalies detected.  Sensory (Neurological): Dermatomal pain pattern        Sensory (Neurological): Unimpaired        DTR: Patellar: deferred today Achilles: deferred today Plantar: deferred today  DTR: Patellar: deferred today Achilles: deferred today Plantar: deferred today  Palpation: No palpable anomalies  Palpation: No palpable anomalies   Assessment  Primary Diagnosis & Pertinent Problem List: The primary encounter diagnosis was Chronic radicular lumbar pain. Diagnoses of Lumbar radiculopathy,  Alcohol intoxication with delirium ([place]), Major depressive disorder, recurrent severe without psychotic features ([place]), PTSD (post-traumatic stress disorder), Polysubstance abuse ([place]), and Chronic pain syndrome were also pertinent to this visit.  Visit Diagnosis (New problems to examiner): 1. Chronic radicular lumbar pain   2. Lumbar radiculopathy   3. Alcohol intoxication with delirium ([place])   4. Major depressive disorder, recurrent severe without psychotic features ([place])   5. PTSD (post-traumatic stress disorder)   6. Polysubstance abuse ([place])   7. Chronic pain syndrome    Patient is high risk for opioid prescribing. The patient will not be a candidate for opioid therapy through this clinic. Will focus primarily on interventional pain management. Patient instructed to continue psychiatric care for his MDD and PTSD.  Plan of Care (Initial workup plan)   Procedure Orders     Caudal Epidural Injection Pharmacotherapy (current): Medications ordered:  Meds ordered this encounter  Medications  . orphenadrine (NORFLEX) injection 30 mg   Medications administered during this visit: We administered orphenadrine.   Pharmacological management options:  Opioid Analgesics: Not a candidate.  Prior history of polysubstance abuse including heroin, methamphetamines, cocaine, alcohol.  Also significant psychiatric history including depression and PTSD.  Very high risk for opioid misuse abuse.  Do not recommend chronic opioid therapy  Membrane stabilizer: Tried and failed gabapentin, Lyrica, Cymbalta  Muscle relaxant: Tried and failed Flexeril  NSAID: Ibuprofen, naproxen  Other analgesic(s): To be determined at a later time   Interventional management options: Mr. Petrosino was informed that there is no guarantee that he would be a candidate for interventional therapies. The decision will be based on the results of diagnostic studies, as well as Mr. Saraceno risk profile.  Procedure(s) under  consideration:  Caudal ESI   Provider-requested follow-up: No follow-ups on file.  Future Appointments  Date Time Provider [place]  05/06/2020 12:30 PM Gillis Santa, MD [facility] None    Note by: Gillis Santa, MD Date: 04/20/2020; Time: 3:31 PM

## 2020-04-20 NOTE — Patient Instructions (Addendum)
We will be focusing primarily on interventional pain management via lumbar spinal injection.  [facility].  Patient would not be a candidate for chronic opioid therapy at [facility] given prior history of polysubstance abuse and comorbid psychiatric disease increasing his risk of opioid misuse and abuse.  Consider referral to psychiatry for Suboxone management.  Can follow-up at the [facility] for medication management.  ___________________________________________________________________________________________  General Risks and Possible Complications  Patient Responsibilities: It is important that you read this as it is part of your informed consent. It is our duty to inform you of the risks and possible complications associated with treatments offered to you. It is your responsibility as a patient to read this and to ask questions about anything that is not clear or that you believe was not covered in this document.  Patient's Rights: You have the right to refuse treatment. You also have the right to change your mind, even after initially having agreed to have the treatment done. However, under this last option, if you wait until the last second to change your mind, you may be charged for the materials used up to that point.  Introduction: Medicine is not an Chief Strategy Officer. Everything in Medicine, including the lack of treatment(s), carries the potential for danger, harm, or loss (which is by definition: Risk). In Medicine, a complication is a secondary problem, condition, or disease that can aggravate an already existing one. All treatments carry the risk of possible complications. The fact that a side effects or complications occurs, does not imply that the treatment was conducted incorrectly. It must be clearly understood that these can happen even when everything is done following the highest safety standards.  No treatment: You can choose not to proceed with the  proposed treatment alternative. The "PRO(s)" would include: avoiding the risk of complications associated with the therapy. The "CON(s)" would include: not getting any of the treatment benefits. These benefits fall under one of three categories: diagnostic; therapeutic; and/or palliative. Diagnostic benefits include: getting information which can ultimately lead to improvement of the disease or symptom(s). Therapeutic benefits are those associated with the successful treatment of the disease. Finally, palliative benefits are those related to the decrease of the primary symptoms, without necessarily curing the condition (example: decreasing the pain from a flare-up of a chronic condition, such as incurable terminal cancer).  General Risks and Complications: These are associated to most interventional treatments. They can occur alone, or in combination. They fall under one of the following six (6) categories: no benefit or worsening of symptoms; bleeding; infection; nerve damage; allergic reactions; and/or death. 1. No benefits or worsening of symptoms: In Medicine there are no guarantees, only probabilities. No healthcare provider can ever guarantee that a medical treatment will work, they can only state the probability that it may. Furthermore, there is always the possibility that the condition may worsen, either directly, or indirectly, as a consequence of the treatment. 2. Bleeding: This is more common if the patient is taking a blood thinner, either prescription or over the counter (example: Goody Powders, Fish oil, Aspirin, Garlic, etc.), or if suffering a condition associated with impaired coagulation (example: Hemophilia, cirrhosis of the liver, low platelet counts, etc.). However, even if you do not have one on these, it can still happen. If you have any of these conditions, or take one of these drugs, make sure to notify your treating physician. 3. Infection: This is more common in patients with a  compromised immune  system, either due to disease (example: diabetes, cancer, human immunodeficiency virus [HIV], etc.), or due to medications or treatments (example: therapies used to treat cancer and rheumatological diseases). However, even if you do not have one on these, it can still happen. If you have any of these conditions, or take one of these drugs, make sure to notify your treating physician. 4. Nerve Damage: This is more common when the treatment is an invasive one, but it can also happen with the use of medications, such as those used in the treatment of cancer. The damage can occur to small secondary nerves, or to large primary ones, such as those in the spinal cord and brain. This damage may be temporary or permanent and it may lead to impairments that can range from temporary numbness to permanent paralysis and/or brain death. 5. Allergic Reactions: Any time a substance or material comes in contact with our body, there is the possibility of an allergic reaction. These can range from a mild skin rash (contact dermatitis) to a severe systemic reaction (anaphylactic reaction), which can result in death. 6. Death: In general, any medical intervention can result in death, most of the time due to an unforeseen complication. ____________________________________________________________________________________________  ____________________________________________________________________________________________  Preparing for Procedure with Sedation  Procedure appointments are limited to planned procedures: . No Prescription Refills. . No disability issues will be discussed. . No medication changes will be discussed.  Instructions: . Oral Intake: Do not eat or drink anything for at least 8 hours prior to your procedure. (Exception: Blood Pressure Medication. See below.) . Transportation: Unless otherwise stated by your physician, you may drive yourself after the procedure. . Blood Pressure  Medicine: Do not forget to take your blood pressure medicine with a sip of water the morning of the procedure. If your Diastolic (lower reading)is above 100 mmHg, elective cases will be cancelled/rescheduled. . Blood thinners: These will need to be stopped for procedures. Notify our staff if you are taking any blood thinners. Depending on which one you take, there will be specific instructions on how and when to stop it. . Diabetics on insulin: Notify the staff so that you can be scheduled 1st case in the morning. If your diabetes requires high dose insulin, take only  of your normal insulin dose the morning of the procedure and notify the staff that you have done so. . Preventing infections: Shower with an antibacterial soap the morning of your procedure. . Build-up your immune system: Take 1000 mg of Vitamin C with every meal (3 times a day) the day prior to your procedure. Marland Kitchen Antibiotics: Inform the staff if you have a condition or reason that requires you to take antibiotics before dental procedures. . Pregnancy: If you are pregnant, call and cancel the procedure. . Sickness: If you have a cold, fever, or any active infections, call and cancel the procedure. . Arrival: You must be in the facility at least 30 minutes prior to your scheduled procedure. . Children: Do not bring children with you. . Dress appropriately: Bring dark clothing that you would not mind if they get stained. . Valuables: Do not bring any jewelry or valuables.  Reasons to call and reschedule or cancel your procedure: (Following these recommendations will minimize the risk of a serious complication.) . Surgeries: Avoid having procedures within 2 weeks of any surgery. (Avoid for 2 weeks before or after any surgery). . Flu Shots: Avoid having procedures within 2 weeks of a flu shots or . (Avoid for 2  weeks before or after immunizations). . Barium: Avoid having a procedure within 7-10 days after having had a radiological study  involving the use of radiological contrast. (Myelograms, Barium swallow or enema study). . Heart attacks: Avoid any elective procedures or surgeries for the initial 6 months after a "Myocardial Infarction" (Heart Attack). . Blood thinners: It is imperative that you stop these medications before procedures. Let us know if you if you take any blood thinner.  . Infection: Avoid procedures during or within two weeks of an infection (including chest colds or gastrointestinal problems). Symptoms associated with infections include: Localized redness, fever, chills, night sweats or profuse sweating, burning sensation when voiding, cough, congestion, stuffiness, runny nose, sore throat, diarrhea, nausea, vomiting, cold or Flu symptoms, recent or current infections. It is specially important if the infection is over the area that we intend to treat. Marland Kitchen Heart and lung problems: Symptoms that may suggest an active cardiopulmonary problem include: cough, chest pain, breathing difficulties or shortness of breath, dizziness, ankle swelling, uncontrolled high or unusually low blood pressure, and/or palpitations. If you are experiencing any of these symptoms, cancel your procedure and contact your primary care physician for an evaluation.  Remember:  Regular Business hours are:  Monday to Thursday 8:00 AM to 4:00 PM  Provider's Schedule: Milinda Pointer, MD:  Procedure days: Tuesday and Thursday 7:30 AM to 4:00 PM  Gillis Santa, MD:  Procedure days: Monday and Wednesday 7:30 AM to 4:00 PM ____________________________________________________________________________________________  Epidural Steroid Injection Patient Information  Description: The epidural space surrounds the nerves as they exit the spinal cord.  In some patients, the nerves can be compressed and inflamed by a bulging disc or a tight spinal canal (spinal stenosis).  By injecting steroids into the epidural space, we can bring irritated nerves into  direct contact with a potentially helpful medication.  These steroids act directly on the irritated nerves and can reduce swelling and inflammation which often leads to decreased pain.  Epidural steroids may be injected anywhere along the spine and from the neck to the low back depending upon the location of your pain.   After numbing the skin with local anesthetic (like Novocaine), a small needle is passed into the epidural space slowly.  You may experience a sensation of pressure while this is being done.  The entire block usually last less than 10 minutes.  Conditions which may be treated by epidural steroids:   Low back and leg pain  Neck and arm pain  Spinal stenosis  Post-laminectomy syndrome  Herpes zoster (shingles) pain  Pain from compression fractures  Preparation for the injection:  1. Do not eat any solid food or dairy products within 8 hours of your appointment.  2. You may drink clear liquids up to 3 hours before appointment.  Clear liquids include water, black coffee, juice or soda.  No milk or cream please. 3. You may take your regular medication, including pain medications, with a sip of water before your appointment  Diabetics should hold regular insulin (if taken separately) and take 1/2 normal NPH dos the morning of the procedure.  Carry some sugar containing items with you to your appointment. 4. A driver must accompany you and be prepared to drive you home after your procedure.  5. Bring all your current medications with your. 6. An IV may be inserted and sedation may be given at the discretion of the physician.   7. A blood pressure cuff, EKG and other monitors will often be applied  during the procedure.  Some patients may need to have extra oxygen administered for a short period. 8. You will be asked to provide medical information, including your allergies, prior to the procedure.  We must know immediately if you are taking blood thinners (like Coumadin/Warfarin)   Or if you are allergic to IV iodine contrast (dye). We must know if you could possible be pregnant.  Possible side-effects:  Bleeding from needle site  Infection (rare, may require surgery)  Nerve injury (rare)  Numbness & tingling (temporary)  Difficulty urinating (rare, temporary)  Spinal headache ( a headache worse with upright posture)  Light -headedness (temporary)  Pain at injection site (several days)  Decreased blood pressure (temporary)  Weakness in arm/leg (temporary)  Pressure sensation in back/neck (temporary)  Call if you experience:  Fever/chills associated with headache or increased back/neck pain.  Headache worsened by an upright position.  New onset weakness or numbness of an extremity below the injection site  Hives or difficulty breathing (go to the emergency room)  Inflammation or drainage at the infection site  Severe back/neck pain  Any new symptoms which are concerning to you  Please note:  Although the local anesthetic injected can often make your back or neck feel good for several hours after the injection, the pain will likely return.  It takes 3-7 days for steroids to work in the epidural space.  You may not notice any pain relief for at least that one week.  If effective, we will often do a series of three injections spaced 3-6 weeks apart to maximally decrease your pain.  After the initial series, we generally will wait several months before considering a repeat injection of the same type.  If you have any questions, please call [phone] [facility]

## 2020-05-06 ENCOUNTER — Ambulatory Visit
Payer: No Typology Code available for payment source | Admitting: Student in an Organized Health Care Education/Training Program

## 2020-12-31 ENCOUNTER — Other Ambulatory Visit: Payer: Self-pay

## 2020-12-31 DIAGNOSIS — Z5321 Procedure and treatment not carried out due to patient leaving prior to being seen by health care provider: Secondary | ICD-10-CM | POA: Insufficient documentation

## 2020-12-31 DIAGNOSIS — M791 Myalgia, unspecified site: Secondary | ICD-10-CM | POA: Diagnosis not present

## 2020-12-31 DIAGNOSIS — J029 Acute pharyngitis, unspecified: Secondary | ICD-10-CM | POA: Insufficient documentation

## 2020-12-31 NOTE — ED Triage Notes (Signed)
Patient reports exposure to 2 people with COVID. Patient reports he is here for a COVID swab. Patient reports body aches and sore throat x 2 days.

## 2021-01-01 ENCOUNTER — Encounter: Payer: Self-pay | Admitting: Oncology

## 2021-01-01 ENCOUNTER — Emergency Department
Admission: EM | Admit: 2021-01-01 | Discharge: 2021-01-01 | Disposition: A | Discharge status: Home / Self Care | Payer: No Typology Code available for payment source | Attending: Emergency Medicine | Admitting: Emergency Medicine

## 2021-01-01 NOTE — ED Notes (Signed)
Called for covid swab no answer, ED lobby screener said pt was going to get a cab home. Pt not outside

## 2021-01-18 IMAGING — DX DG CHEST 1V PORT
1 series · 1 of 1 positions shown · non-contrast
Comparison: 02/18/2020.

CLINICAL DATA: Shrunken hypoxic. Evaluate for infiltrates or signs
of aspiration.

EXAM:
PORTABLE CHEST 1 VIEW

[chest ap]
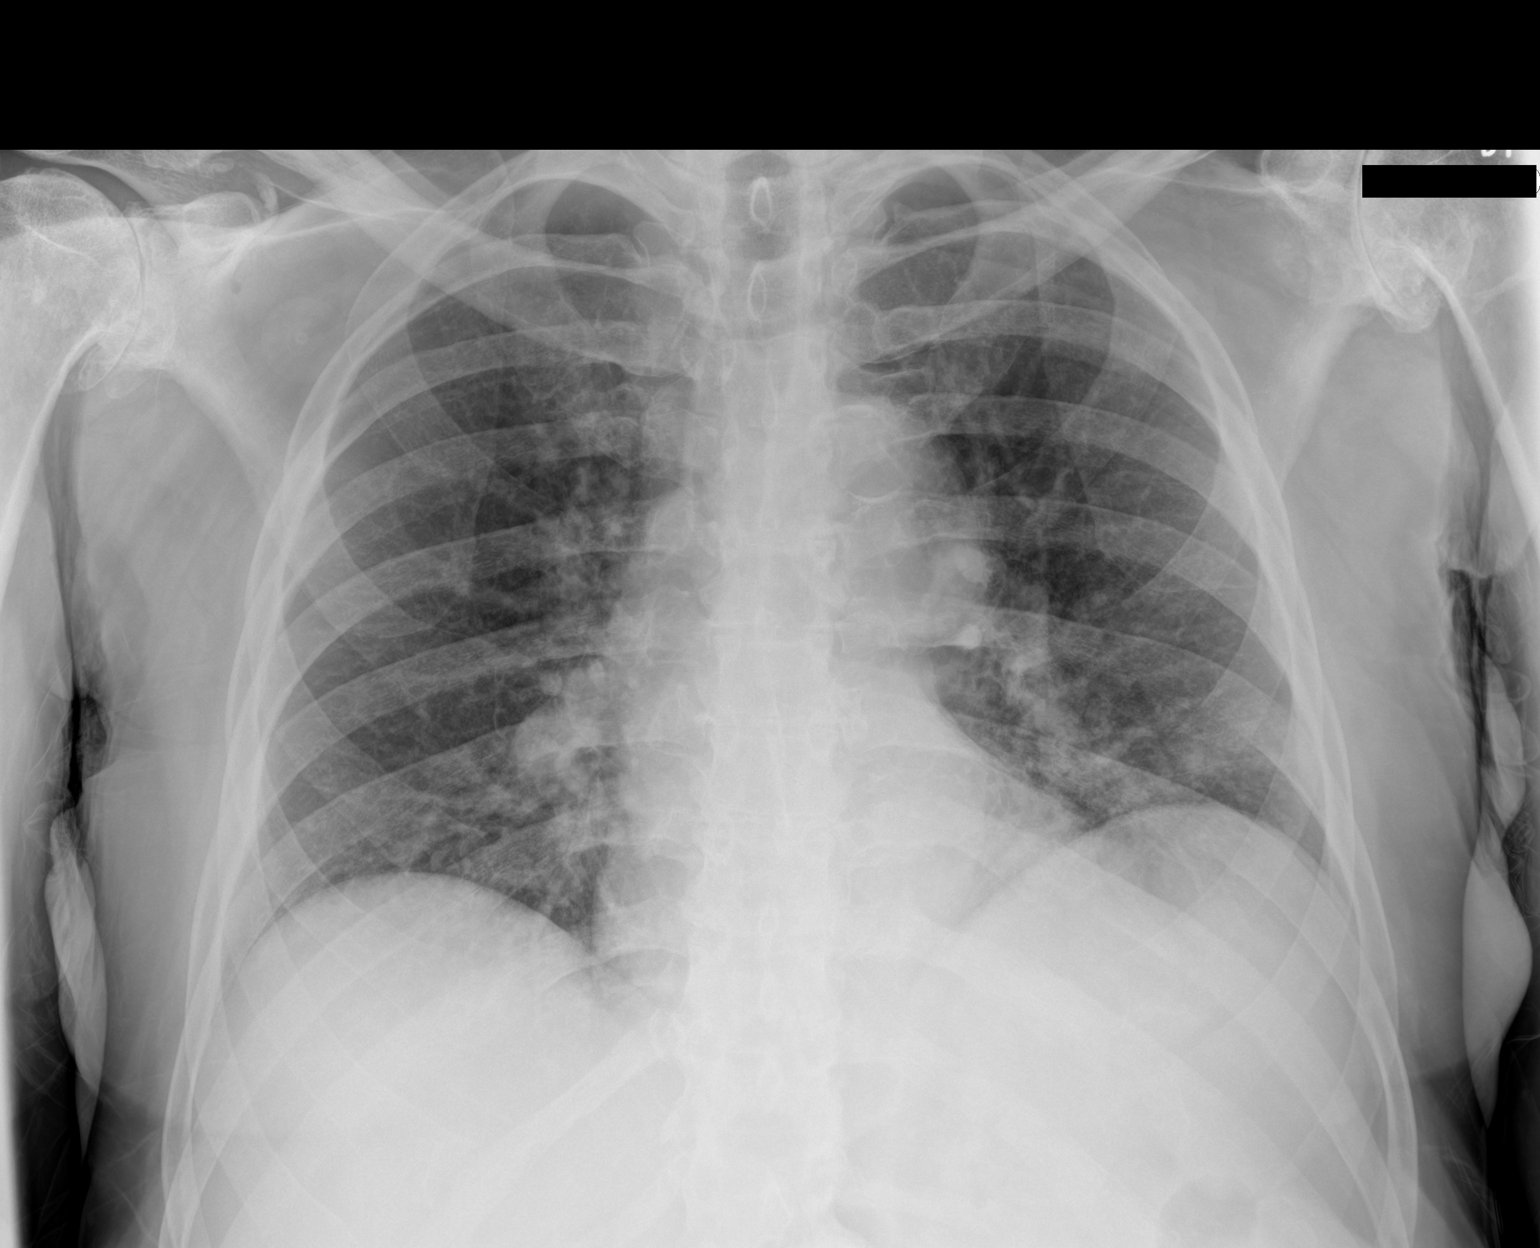

[1 of 1 positions shown; findings below may reference images not displayed]

FINDINGS: The heart size and mediastinal contours are within normal limits.
Aortic atherosclerosis. Low lung volumes with subtle opacities at
the left lung base. No visible pleural effusions or pneumothorax on
this limited portable semi supine study. Severe bilateral shoulder
degenerative change. No acute osseous abnormality.
IMPRESSION: Low lung volumes with subtle opacities at the left lung base,
possibly early pneumonia or aspiration.

## 2022-10-12 ENCOUNTER — Encounter: Payer: Self-pay | Admitting: Oncology

## 2022-10-12 ENCOUNTER — Emergency Department
Admission: EM | Admit: 2022-10-12 | Discharge: 2022-10-13 | Disposition: A | Discharge status: Home / Self Care | Payer: No Typology Code available for payment source | Attending: Emergency Medicine | Admitting: Emergency Medicine

## 2022-10-12 ENCOUNTER — Emergency Department: Payer: No Typology Code available for payment source

## 2022-10-12 DIAGNOSIS — R531 Weakness: Secondary | ICD-10-CM | POA: Insufficient documentation

## 2022-10-12 DIAGNOSIS — I10 Essential (primary) hypertension: Secondary | ICD-10-CM | POA: Insufficient documentation

## 2022-10-12 DIAGNOSIS — R55 Syncope and collapse: Secondary | ICD-10-CM | POA: Diagnosis present

## 2022-10-12 DIAGNOSIS — R42 Dizziness and giddiness: Secondary | ICD-10-CM | POA: Diagnosis not present

## 2022-10-12 LAB — COMPREHENSIVE METABOLIC PANEL
ALT: 17 U/L (ref 0–44)
AST: 22 U/L (ref 15–41)
Albumin: 4 g/dL (ref 3.5–5.0)
Alkaline Phosphatase: 86 U/L (ref 38–126)
Anion gap: 11 (ref 5–15)
BUN: 25 mg/dL — ABNORMAL HIGH (ref 8–23)
CO2: 22 mmol/L (ref 22–32)
Calcium: 8.8 mg/dL — ABNORMAL LOW (ref 8.9–10.3)
Chloride: 104 mmol/L (ref 98–111)
Creatinine, Ser: 1.4 mg/dL — ABNORMAL HIGH (ref 0.61–1.24)
GFR, Estimated: 53 mL/min — ABNORMAL LOW (ref >60)
Glucose, Bld: 112 mg/dL — ABNORMAL HIGH (ref 70–99)
Potassium: 3.3 mmol/L — ABNORMAL LOW (ref 3.5–5.1)
Sodium: 137 mmol/L (ref 135–145)
Total Bilirubin: 0.8 mg/dL (ref 0.3–1.2)
Total Protein: 6.5 g/dL (ref 6.5–8.1)

## 2022-10-12 LAB — URINALYSIS, ROUTINE W REFLEX MICROSCOPIC
Bilirubin Urine: NEGATIVE
Glucose, UA: NEGATIVE mg/dL
Hgb urine dipstick: NEGATIVE
Ketones, ur: NEGATIVE mg/dL
Leukocytes,Ua: NEGATIVE
Nitrite: NEGATIVE
Protein, ur: NEGATIVE mg/dL
Specific Gravity, Urine: 1.012 (ref 1.005–1.030)
pH: 5 (ref 5.0–8.0)

## 2022-10-12 LAB — CBC WITH DIFFERENTIAL/PLATELET
Abs Immature Granulocytes: 0.1 10*3/uL — ABNORMAL HIGH (ref 0.00–0.07)
Basophils Absolute: 0 10*3/uL (ref 0.0–0.1)
Basophils Relative: 0 %
Eosinophils Absolute: 0.1 10*3/uL (ref 0.0–0.5)
Eosinophils Relative: 1 %
HCT: 40.3 % (ref 39.0–52.0)
Hemoglobin: 13.6 g/dL (ref 13.0–17.0)
Immature Granulocytes: 1 %
Lymphocytes Relative: 10 %
Lymphs Abs: 1.1 10*3/uL (ref 0.7–4.0)
MCH: 29.1 pg (ref 26.0–34.0)
MCHC: 33.7 g/dL (ref 30.0–36.0)
MCV: 86.3 fL (ref 80.0–100.0)
Monocytes Absolute: 0.8 10*3/uL (ref 0.1–1.0)
Monocytes Relative: 8 %
Neutro Abs: 8.9 10*3/uL — ABNORMAL HIGH (ref 1.7–7.7)
Neutrophils Relative %: 80 %
Platelets: 254 10*3/uL (ref 150–400)
RBC: 4.67 MIL/uL (ref 4.22–5.81)
RDW: 12.4 % (ref 11.5–15.5)
WBC: 11.1 10*3/uL — ABNORMAL HIGH (ref 4.0–10.5)
nRBC: 0 % (ref 0.0–0.2)

## 2022-10-12 LAB — TROPONIN I (HIGH SENSITIVITY): Troponin I (High Sensitivity): 5 ng/L (ref <18)

## 2022-10-12 LAB — LACTIC ACID, PLASMA: Lactic Acid, Venous: 1.2 mmol/L (ref 0.5–1.9)

## 2022-10-12 MED ORDER — SODIUM CHLORIDE 0.9 % IV BOLUS
1000.0000 mL | Freq: Once | INTRAVENOUS | Status: AC
  Administered 2022-10-12: 1000 mL via INTRAVENOUS

## 2022-10-12 NOTE — ED Triage Notes (Signed)
Pt to ED via EMS from home with recent AMS and suspected infection. Pt was found on front porch altered by family member. Unknown if he had a fall but denies new pain. Pt is A&O x4 at baseline. Pt is able to answer orientation questions but is behaving oddly and lacking social cues. LKW unknown.   CBG 170

## 2022-10-12 NOTE — ED Provider Notes (Signed)
[facility] Provider Note    Event Date/Time   First MD Initiated Contact with Patient 10/12/22 2017     (approximate)   History   Altered Mental Status   HPI  Omar Davis is a 72 y.o. male with a history of hypertension, alcohol abuse, chronic pain, depression, and PTSD who presents with an episode of near syncope.  The patient's friend reports that he was outside and started to appear very shaky and weak, having to hold onto a garbage can in order to not fall.  He then sat down and continued to appear shaky.  He did not lose consciousness.  The patient states that he felt very lightheaded and weak.  He states that has been happening intermittently over the last few months after he was started on Suboxone for chronic pain.  He is not on any other narcotic pain medications at this time.  He denies any alcohol use and states he quit drinking several years ago.  Denies drugs.  The patient denies any fever or chills.  He has no vomiting or diarrhea.  He has no headache.  He denies any chest pain or difficulty breathing.  I reviewed the past medical records.  The patient follows at the [place] and was last seen on 2/27.  He follows with pain management they are although I am unable to see a current list of medications.   Physical Exam   Triage Vital Signs: ED Triage Vitals  Enc Vitals Group     BP 10/12/22 2010 130/70     Pulse Rate 10/12/22 2010 (!) 113     Resp --      Temp 10/12/22 2016 98.1 F (36.7 C)     Temp Source 10/12/22 2016 Oral     SpO2 10/12/22 2010 95 %     Weight --      Height --      Head Circumference --      Peak Flow --      Pain Score 10/12/22 2007 0     Pain Loc --      Pain Edu? --      Excl. in GC? --     Most recent vital signs: Vitals:   10/12/22 2300 10/12/22 2330  BP: 125/75 119/68  Pulse:    Resp: 12 11  Temp:    SpO2:       General: Alert and oriented, no distress.  CV:  Good peripheral perfusion.   Resp:  Normal effort.  Abd:  Soft and nontender.  No distention.  Other:  EOMI.  PERRLA.  No facial droop.  No pronator drift.  Motor intact in all extremities.  Normal coordination.  Somewhat dry mucous membranes.   ED Results / Procedures / Treatments   Labs (all labs ordered are listed, but only abnormal results are displayed) Labs Reviewed  COMPREHENSIVE METABOLIC PANEL - Abnormal; Notable for the following components:      Result Value   Potassium 3.3 (*)    Glucose, Bld 112 (*)    BUN 25 (*)    Creatinine, Ser 1.40 (*)    Calcium 8.8 (*)    GFR, Estimated 53 (*)    All other components within normal limits  CBC WITH DIFFERENTIAL/PLATELET - Abnormal; Notable for the following components:   WBC 11.1 (*)    Neutro Abs 8.9 (*)    Abs Immature Granulocytes 0.10 (*)    All other components within normal limits  URINALYSIS, ROUTINE W REFLEX MICROSCOPIC - Abnormal; Notable for the following components:   Color, Urine YELLOW (*)    APPearance CLEAR (*)    All other components within normal limits  LACTIC ACID, PLASMA  TROPONIN I (HIGH SENSITIVITY)  TROPONIN I (HIGH SENSITIVITY)     EKG  ED ECG REPORT I, Dionne Bucy, the attending physician, personally viewed and interpreted this ECG.  Date: 10/12/2022 EKG Time: 2015 Rate: 110 Rhythm: Sinus tachycardia (incorrectly read by machine as atrial flutter) QRS Axis: normal Intervals: LBBB ST/T Wave abnormalities: Nonspecific ST abnormalities Narrative Interpretation: no evidence of acute ischemia    RADIOLOGY  CT head: I independently viewed and interpreted the images; there is no ICH.  Radiology report indicates no acute abnormality.   PROCEDURES:  Critical Care performed: No  Procedures   MEDICATIONS ORDERED IN ED: Medications  sodium chloride 0.9 % bolus 1,000 mL (0 mLs Intravenous Stopped 10/12/22 2338)     IMPRESSION / MDM / ASSESSMENT AND PLAN / ED COURSE  I reviewed the triage vital signs and  the nursing notes.  72 year old male with PMH as noted above presents with an episode of weakness and near syncope today.  He has had several episodes like this after being started on Suboxone and states that it makes him generally feel weak and shaky.  The patient is slightly tachycardic with otherwise normal vital signs.  Neurologic exam is normal.  Differential diagnosis includes, but is not limited to, medication side effect, dehydration, electrolyte abnormality, other metabolic cause, UTI or other infection, less likely cardiac or CNS etiology.  Will obtain CT head, lab workup including cardiac enzymes, lactate, urinalysis, and reassess.  Patient's presentation is most consistent with acute presentation with potential threat to life or bodily function.  The patient is on the cardiac monitor to evaluate for evidence of arrhythmia and/or significant heart rate changes.   ----------------------------------------- 12:19 AM on 10/13/2022 -----------------------------------------  Lab workup is overall reassuring.  Troponin is negative.  Lactate is normal.  There is no leukocytosis.  Urinalysis is negative.  CT head shows no acute abnormality.  On reassessment after fluids the patient states he is feeling significantly better and he appears more comfortable.  I did consider whether he may benefit from inpatient admission given his age and the near syncopal episode, however he would strongly prefer to go home which I feel is reasonable given the reassuring workup.  I counseled him on the results of the workup and plan of care.  I anticipate discharge home.  Repeat troponin is pending and if this is negative he will be stable for discharge.   FINAL CLINICAL IMPRESSION(S) / ED DIAGNOSES   Final diagnoses:  Near syncope     Rx / DC Orders   ED Discharge Orders     None        Note:  This document was prepared using Dragon voice recognition software and may include unintentional  dictation errors.    Dionne Bucy, MD 10/13/22 0020

## 2022-10-13 LAB — TROPONIN I (HIGH SENSITIVITY): Troponin I (High Sensitivity): 14 ng/L (ref <18)

## 2022-10-13 NOTE — Discharge Instructions (Signed)
Follow-up with your doctor at the [facility] as scheduled and discussed the side effects you are having from the buprenorphine.  Make sure to drink plenty of fluids.  Return to the ER for new, worsening, or recurrent episodes of weakness, lightheadedness, feel like you are going to pass out or actually losing consciousness, or any other new or worsening symptoms that concern you.

## 2022-10-13 NOTE — ED Notes (Signed)
Pt Dc to home. Dc instructions reviewed with all questions answered. Pt voices understanding. IV removed, cath intact, pressure dressing applied. No bleeding noted at site. Pt ambulatory out of dept with steady gait with family member. 

## 2022-10-13 NOTE — ED Provider Notes (Signed)
Patient signed out to me pending repeat troponin and likely discharge.  Troponin went from 5-14 but is still negative.  First EKG had some artifact so repeated it shows a left bundle branch block which is new but is not meeting any Sgarbossa criteria to suggest ischemia.  On repeat assessment patient is denying any chest pain or ischemic symptoms.  This point I do think that he is appropriate to be discharged.  I did discuss with him that if his he develops any chest pain that he return to ER.   Georga Hacking, MD 10/13/22 [phone]

## 2024-02-29 ENCOUNTER — Other Ambulatory Visit: Payer: Self-pay

## 2024-02-29 ENCOUNTER — Emergency Department: Admission: EM | Admit: 2024-02-29 | Discharge: 2024-03-01 | Disposition: A | Discharge status: Home / Self Care

## 2024-02-29 ENCOUNTER — Emergency Department

## 2024-02-29 DIAGNOSIS — R410 Disorientation, unspecified: Secondary | ICD-10-CM | POA: Insufficient documentation

## 2024-02-29 DIAGNOSIS — F10120 Alcohol abuse with intoxication, uncomplicated: Secondary | ICD-10-CM | POA: Insufficient documentation

## 2024-02-29 DIAGNOSIS — W19XXXA Unspecified fall, initial encounter: Secondary | ICD-10-CM | POA: Insufficient documentation

## 2024-02-29 DIAGNOSIS — I10 Essential (primary) hypertension: Secondary | ICD-10-CM | POA: Insufficient documentation

## 2024-02-29 DIAGNOSIS — F10920 Alcohol use, unspecified with intoxication, uncomplicated: Secondary | ICD-10-CM

## 2024-02-29 LAB — CBC WITH DIFFERENTIAL/PLATELET
Abs Immature Granulocytes: 0.08 K/uL — ABNORMAL HIGH (ref 0.00–0.07)
Basophils Absolute: 0.1 K/uL (ref 0.0–0.1)
Basophils Relative: 1 %
Eosinophils Absolute: 0.2 K/uL (ref 0.0–0.5)
Eosinophils Relative: 4 %
HCT: 40.3 % (ref 39.0–52.0)
Hemoglobin: 13.5 g/dL (ref 13.0–17.0)
Immature Granulocytes: 1 %
Lymphocytes Relative: 9 %
Lymphs Abs: 0.5 K/uL — ABNORMAL LOW (ref 0.7–4.0)
MCH: 29.7 pg (ref 26.0–34.0)
MCHC: 33.5 g/dL (ref 30.0–36.0)
MCV: 88.6 fL (ref 80.0–100.0)
Monocytes Absolute: 0.6 K/uL (ref 0.1–1.0)
Monocytes Relative: 10 %
Neutro Abs: 4.3 K/uL (ref 1.7–7.7)
Neutrophils Relative %: 75 %
Platelets: 232 K/uL (ref 150–400)
RBC: 4.55 MIL/uL (ref 4.22–5.81)
RDW: 12.8 % (ref 11.5–15.5)
WBC: 5.9 K/uL (ref 4.0–10.5)
nRBC: 0 % (ref 0.0–0.2)

## 2024-02-29 LAB — URINALYSIS, ROUTINE W REFLEX MICROSCOPIC
Bilirubin Urine: NEGATIVE
Glucose, UA: NEGATIVE mg/dL
Hgb urine dipstick: NEGATIVE
Ketones, ur: NEGATIVE mg/dL
Leukocytes,Ua: NEGATIVE
Nitrite: NEGATIVE
Protein, ur: NEGATIVE mg/dL
Specific Gravity, Urine: 1.008 (ref 1.005–1.030)
pH: 6 (ref 5.0–8.0)

## 2024-02-29 LAB — URINE DRUG SCREEN, QUALITATIVE (ARMC ONLY)
Amphetamines, Ur Screen: NOT DETECTED
Barbiturates, Ur Screen: NOT DETECTED
Benzodiazepine, Ur Scrn: NOT DETECTED
Cannabinoid 50 Ng, Ur ~~LOC~~: NOT DETECTED
Cocaine Metabolite,Ur ~~LOC~~: NOT DETECTED
MDMA (Ecstasy)Ur Screen: NOT DETECTED
Methadone Scn, Ur: NOT DETECTED
Opiate, Ur Screen: NOT DETECTED
Phencyclidine (PCP) Ur S: NOT DETECTED
Tricyclic, Ur Screen: POSITIVE — AB

## 2024-02-29 LAB — BASIC METABOLIC PANEL WITH GFR
Anion gap: 9 (ref 5–15)
BUN: 27 mg/dL — ABNORMAL HIGH (ref 8–23)
CO2: 26 mmol/L (ref 22–32)
Calcium: 9.4 mg/dL (ref 8.9–10.3)
Chloride: 105 mmol/L (ref 98–111)
Creatinine, Ser: 0.92 mg/dL (ref 0.61–1.24)
GFR, Estimated: >60 mL/min (ref >60)
Glucose, Bld: 95 mg/dL (ref 70–99)
Potassium: 4.2 mmol/L (ref 3.5–5.1)
Sodium: 140 mmol/L (ref 135–145)

## 2024-02-29 LAB — ETHANOL: Alcohol, Ethyl (B): 137 mg/dL — ABNORMAL HIGH (ref <15)

## 2024-02-29 MED ORDER — THIAMINE HCL 100 MG/ML IJ SOLN: 100.0000 mg | Freq: Every day | INTRAMUSCULAR | Status: DC

## 2024-02-29 MED ORDER — ADULT MULTIVITAMIN W/MINERALS CH: 1.0000 | ORAL_TABLET | Freq: Every day | ORAL | Status: DC

## 2024-02-29 MED ORDER — FOLIC ACID 1 MG PO TABS: 1.0000 mg | ORAL_TABLET | Freq: Every day | ORAL | Status: DC

## 2024-02-29 MED ORDER — LORAZEPAM 1 MG PO TABS: 1.0000 mg | ORAL_TABLET | ORAL | Status: DC | PRN

## 2024-02-29 MED ORDER — THIAMINE MONONITRATE 100 MG PO TABS: 100.0000 mg | ORAL_TABLET | Freq: Every day | ORAL | Status: DC

## 2024-02-29 MED ORDER — LORAZEPAM 2 MG/ML IJ SOLN: 1.0000 mg | INTRAMUSCULAR | Status: DC | PRN

## 2024-02-29 NOTE — ED Triage Notes (Addendum)
 Pt arrives via [facility] from home for a unwitnessed fall. Pt has a Hx of PTSD. Per EMS pt was in a manic state upon arrival. Pt admits to being noncompliant with meds. Pt also stated he has alcohol tonight, unsure of what & amount.

## 2024-02-29 NOTE — ED Provider Notes (Signed)
 [facility] Provider Note    None    (approximate)   History   Fall   HPI  Omar Davis is a 72 y.o. male alcohol abuse, depression, hypertension, PTSD who presents with fall and confusion.  EMS was called out for an unwitnessed fall and patient appeared manic to them upon arrival.  Patient reports that he has been noncompliant with meds for over 6 months.  He does endorse having alcohol this evening.  He is not on any blood thinner.  He denies any SI HI or AVH S.  Denies any pain      Physical Exam   Triage Vital Signs: ED Triage Vitals  Encounter Vitals Group     BP 02/29/24 2039 (!) 126/45     Girls Systolic BP Percentile --      Girls Diastolic BP Percentile --      Boys Systolic BP Percentile --      Boys Diastolic BP Percentile --      Pulse Rate 02/29/24 2039 86     Resp 02/29/24 2039 18     Temp 02/29/24 2039 98.2 F (36.8 C)     Temp src --      SpO2 02/29/24 2039 97 %     Weight 02/29/24 2040 140 lb (63.5 kg)     Height 02/29/24 2040 5' 8 (1.727 m)     Head Circumference --      Peak Flow --      Pain Score --      Pain Loc --      Pain Education --      Exclude from Growth Chart --     Most recent vital signs: Vitals:   02/29/24 2039  BP: (!) 126/45  Pulse: 86  Resp: 18  Temp: 98.2 F (36.8 C)  SpO2: 97%    Nursing Triage Note reviewed. Vital signs reviewed and patients oxygen saturation is normoxic  General: Patient is well nourished, well developed, awake and alert, resting comfortably in no acute distress, smells of alcohol Head: Normocephalic and atraumatic Eyes: Normal inspection, extraocular muscles intact, no conjunctival pallor Ear, nose, throat: Normal external exam Neck: Normal range of motion Respiratory: Patient is in no respiratory distress, lungs CTAB Cardiovascular: Patient is not tachycardic, RRR without murmur appreciated GI: Abd SNT with no guarding or rebound  Back: Normal inspection of  the back with good strength and range of motion throughout all ext Extremities: pulses intact with good cap refills, no LE pitting edema or calf tenderness Neuro: The patient is alert and oriented to person, place, and time, appropriately conversive, with 5/5 bilat UE/LE strength, no gross motor or sensory defects noted. Coordination appears to be adequate.  Slurring words Skin: Warm, dry, and intact Psych: normal mood and affect, no SI or HI  ED Results / Procedures / Treatments   Labs (all labs ordered are listed, but only abnormal results are displayed) Labs Reviewed  CBC WITH DIFFERENTIAL/PLATELET - Abnormal; Notable for the following components:      Result Value   Lymphs Abs 0.5 (*)    Abs Immature Granulocytes 0.08 (*)    All other components within normal limits  BASIC METABOLIC PANEL WITH GFR - Abnormal; Notable for the following components:   BUN 27 (*)    All other components within normal limits  ETHANOL - Abnormal; Notable for the following components:   Alcohol, Ethyl (B) 137 (*)    All other components within  normal limits  URINE DRUG SCREEN, QUALITATIVE ([facility] ONLY) - Abnormal; Notable for the following components:   Tricyclic, Ur Screen POSITIVE (*)    All other components within normal limits  URINALYSIS, ROUTINE W REFLEX MICROSCOPIC - Abnormal; Notable for the following components:   Color, Urine STRAW (*)    APPearance CLEAR (*)    All other components within normal limits     EKG EKG and rhythm strip are interpreted by myself:   EKG: [Normal sinus rhythm] at heart rate of 81, normal QRS duration, QTc 457, nonspecific ST segments and T waves no ectopy EKG not consistent with Acute STEMI Rhythm strip: NSR in lead II   RADIOLOGY CT head without contrast: No acute abnormality on my independent review interpretation    PROCEDURES:  Critical Care performed: No  Procedures   MEDICATIONS ORDERED IN ED: Medications - No data to display   IMPRESSION /  MDM / ASSESSMENT AND PLAN / ED COURSE                                Differential diagnosis includes, but is not limited to, alcohol intoxication, intracranial hemorrhage, electrolyte derangement anemia, polysubstance use, UTI   ED course: Patient presents without any focal neurological deficits but smells of alcohol and history is consistent with such.  Given his fall CT head was obtained which demonstrated no intracranial hemorrhage.  He has no sign of infection or arrhythmia on his EKG.  He has ambulated to the bathroom but still appears clinically intoxicated.  Case will be signed out to oncoming physician pending stabilization   Clinical Course as of 02/29/24 2313  Thu Feb 29, 2024  2231 Alcohol, Ethyl (B)(!): 137 Elevated [HD]  2311 Urinalysis, Routine w reflex microscopic -Urine, Clean Catch(!) No evidence of UTI [HD]  2312 Urine Drug Screen, Qualitative ([facility] only)(!) Only tricyclics are all positive [HD]  2312 Basic metabolic panel(!) No profound electrolyte derangements [HD]  2312 CBC with Differential(!) No leukocytosis or anemia [HD]    Clinical Course User Index [HD] Nicholaus Rolland BRAVO, MD   -- Risk: 5 This patient has a high risk of morbidity due to further diagnostic testing or treatment. Rationale: This patient's evaluation and management involve a high risk of morbidity due to the potential severity of presenting symptoms, need for diagnostic testing, and/or initiation of treatment that may require close monitoring. The differential includes conditions with potential for significant deterioration or requiring escalation of care. Treatment decisions in the ED, including medication administration, procedural interventions, or disposition planning, reflect this level of risk. COPA: 5 The patient has the following acute or chronic illness/injury that poses a possible threat to life or bodily function: [X] : The patient has a potentially serious acute condition or an acute  exacerbation of a chronic illness requiring urgent evaluation and management in the Emergency Department. The clinical presentation necessitates immediate consideration of life-threatening or function-threatening diagnoses, even if they are ultimately ruled out.   FINAL CLINICAL IMPRESSION(S) / ED DIAGNOSES   Final diagnoses:  Fall, initial encounter  Alcoholic intoxication without complication     Rx / DC Orders   ED Discharge Orders     None        Note:  This document was prepared using Dragon voice recognition software and may include unintentional dictation errors.   Nicholaus Rolland BRAVO, MD 02/29/24 [phone]

## 2024-03-01 ENCOUNTER — Encounter: Payer: Self-pay | Admitting: Oncology
